# Patient Record
Sex: Male | Born: 1970
Health system: Southern US, Community
[De-identification: ages and names within clinical notes are randomized; demographics above are authoritative.]

## PROBLEM LIST (undated history)

## (undated) DIAGNOSIS — F329 Major depressive disorder, single episode, unspecified: Secondary | ICD-10-CM

## (undated) DIAGNOSIS — K5010 Crohn's disease of large intestine without complications: Secondary | ICD-10-CM

## (undated) DIAGNOSIS — I639 Cerebral infarction, unspecified: Secondary | ICD-10-CM

## (undated) DIAGNOSIS — I2699 Other pulmonary embolism without acute cor pulmonale: Secondary | ICD-10-CM

## (undated) DIAGNOSIS — T7840XA Allergy, unspecified, initial encounter: Secondary | ICD-10-CM

## (undated) DIAGNOSIS — E559 Vitamin D deficiency, unspecified: Secondary | ICD-10-CM

## (undated) DIAGNOSIS — F32A Depression, unspecified: Secondary | ICD-10-CM

## (undated) HISTORY — PX: HIP SURGERY: SHX245

## (undated) HISTORY — DX: Cerebral infarction, unspecified: I63.9

## (undated) HISTORY — DX: Allergy, unspecified, initial encounter: T78.40XA

## (undated) HISTORY — DX: Vitamin D deficiency, unspecified: E55.9

---

## 1998-07-19 ENCOUNTER — Emergency Department (HOSPITAL_COMMUNITY): Admission: EM | Admit: 1998-07-19 | Discharge: 1998-07-19 | Payer: Self-pay | Admitting: Emergency Medicine

## 2006-07-24 ENCOUNTER — Inpatient Hospital Stay (HOSPITAL_COMMUNITY): Admission: EM | Admit: 2006-07-24 | Discharge: 2006-07-27 | Payer: Self-pay | Admitting: Emergency Medicine
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
## 2009-04-22 HISTORY — PX: COLOSTOMY: SHX63
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
## 2009-05-08 ENCOUNTER — Encounter (INDEPENDENT_AMBULATORY_CARE_PROVIDER_SITE_OTHER): Payer: Self-pay
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
## 2009-05-09 IMAGING — CR DG CHEST 1V PORT
1 series · 1 of 1 positions shown · non-contrast
Comparison: [DATE]

CLINICAL DATA: PICC line placement.  Right hip dislocation.

PORTABLE CHEST - 1 VIEW

[view not recorded]
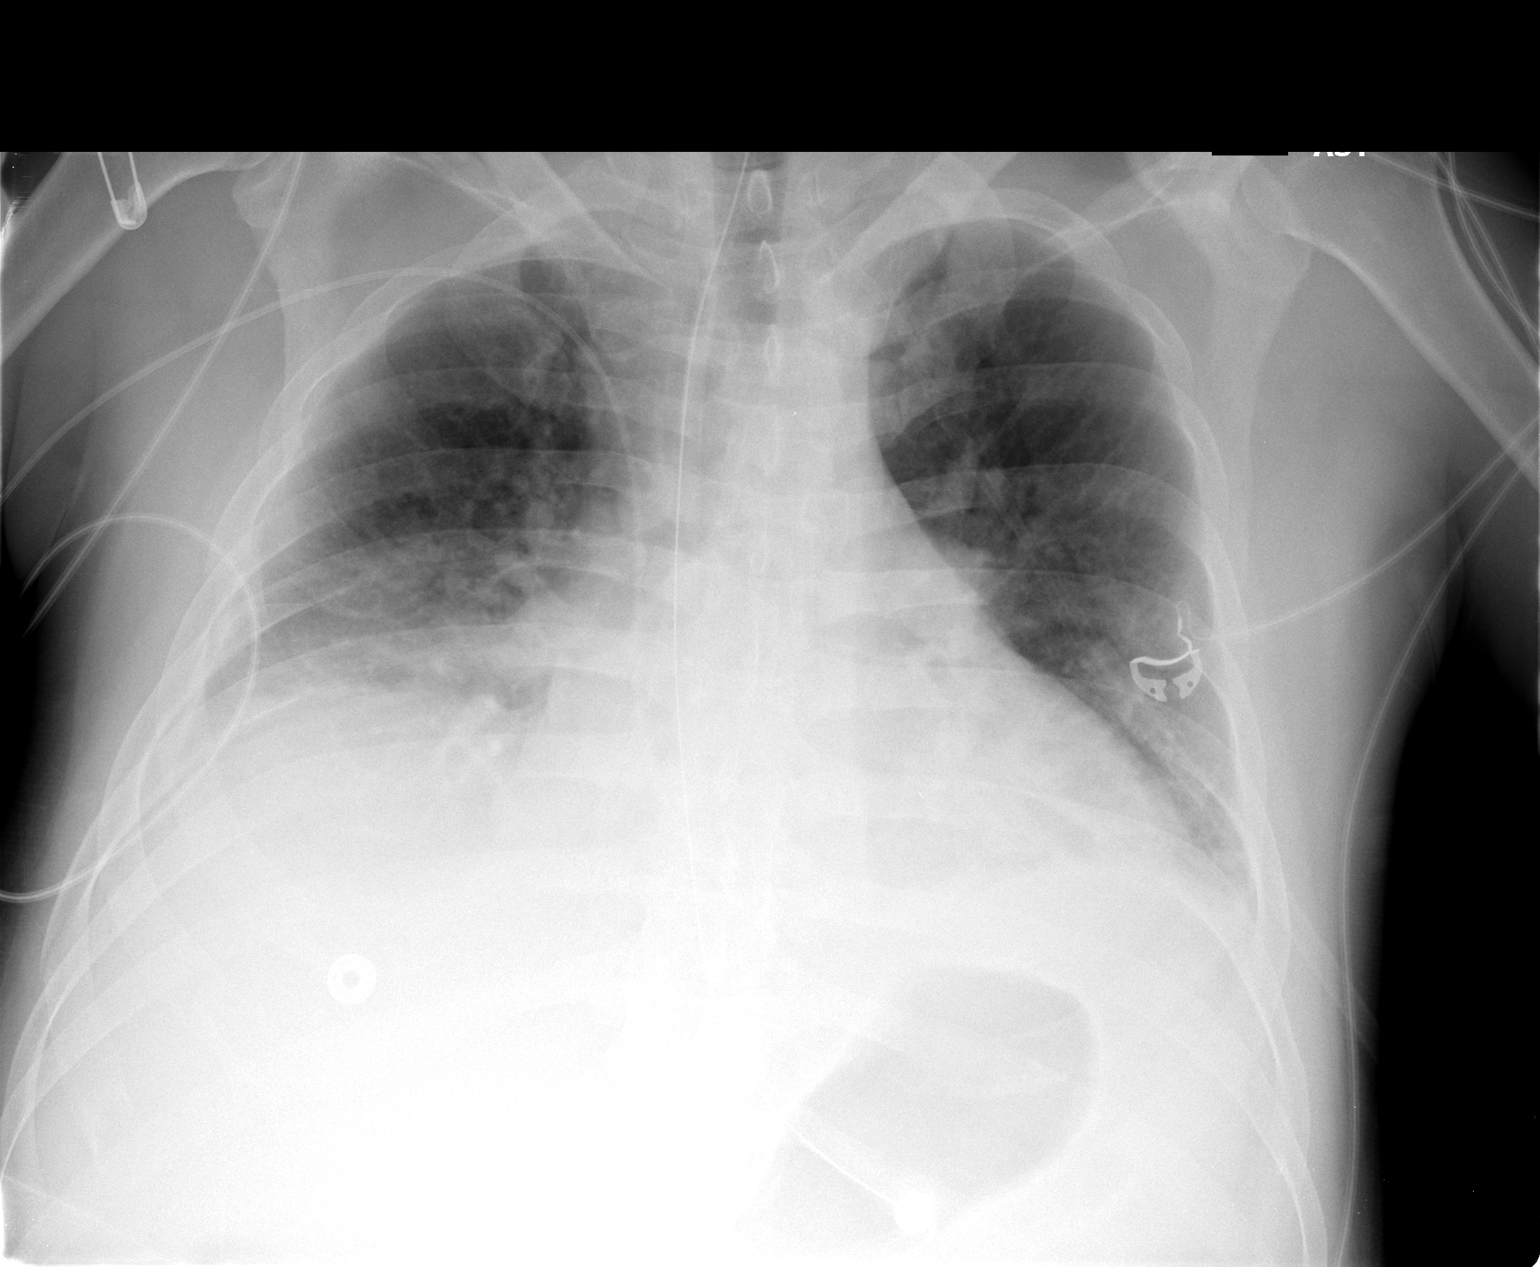

[1 of 1 positions shown; findings below may reference images not displayed]

FINDINGS: Right-sided PICC line has been placed with tip overlying
the lower right atrium.  Recommend 6-7 cm retraction.
An NG tube is again identified entering the stomach with tip off
the field of view.
This is a low-volume film with bibasilar atelectasis/airspace
disease.
Cardiomegaly and pulmonary vascular congestion again noted.
There is no evidence of pneumothorax.
IMPRESSION: Right-sided PICC line with tip overlying the lower right atrium -
recommend 6-7 cm retraction.

Cardiomegaly, pulmonary vascular congestion and bibasilar
atelectasis/airspace disease again noted.

These results were called to EATHALLY on [DATE] at [DATE]
p.m.
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
## 2009-05-13 ENCOUNTER — Ambulatory Visit: Payer: Self-pay | Admitting: Vascular Surgery

## 2009-05-13 ENCOUNTER — Encounter (INDEPENDENT_AMBULATORY_CARE_PROVIDER_SITE_OTHER): Payer: Self-pay | Admitting: General Surgery
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
## 2009-05-19 IMAGING — CT CT ABCESS DRAINAGE
1 series · 14 of 32 positions shown, 18 images · non-contrast
Comparison: none

CLINICAL DATA: Abdominal surgery, fluid collections concerning for
abscesses

[Series 2: abd pelvis · axial · 0.92mm/px · z∈[-292,-37]mm · 14 of 146 slices shown, 18 images]
[im 10/146  soft-tissue]
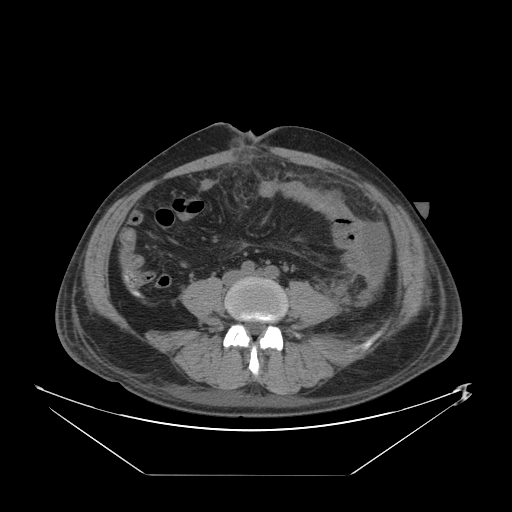
[im 10/146  bone]
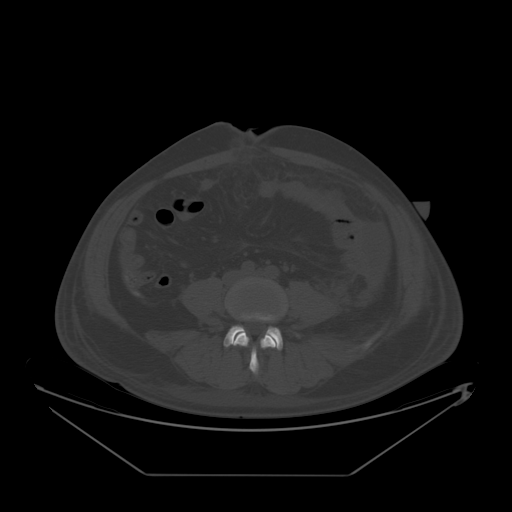
[im 19/146  soft-tissue]
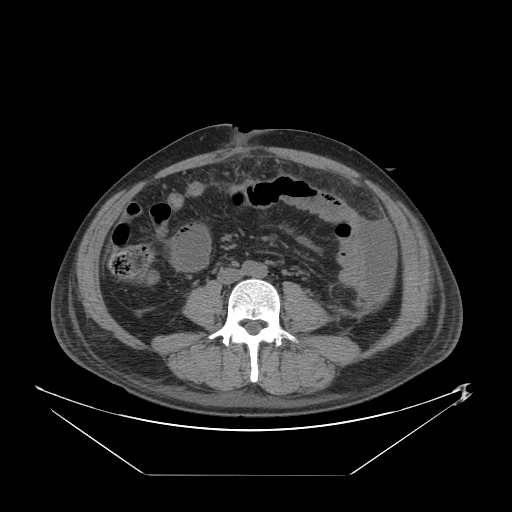
[im 33/146  soft-tissue]
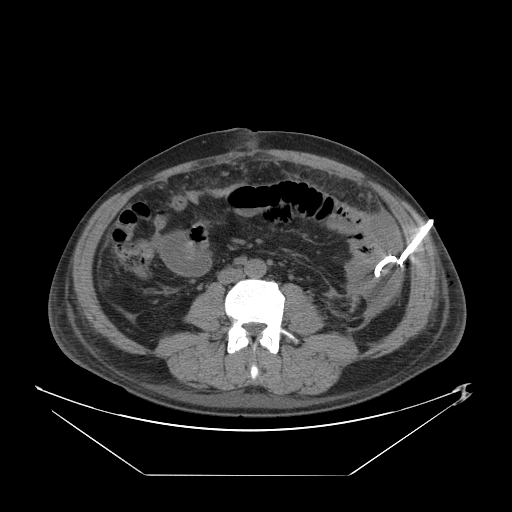
[im 43/146  soft-tissue]
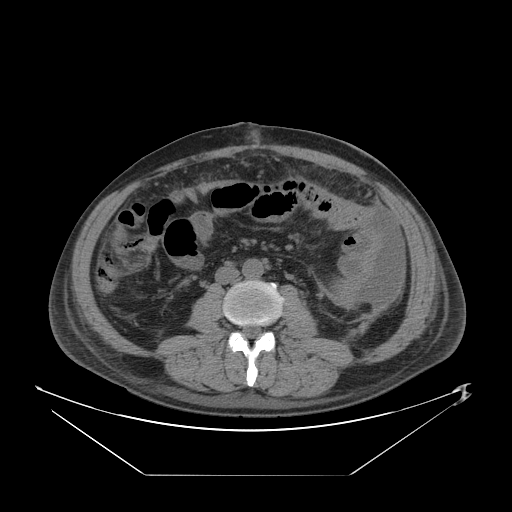
[im 57/146  soft-tissue]
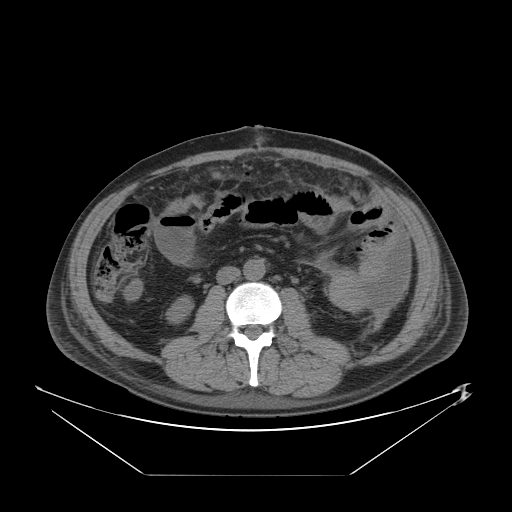
[im 66/146  soft-tissue]
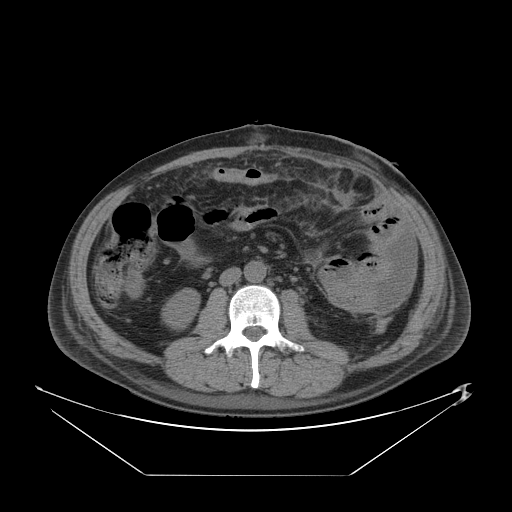
[im 80/146  soft-tissue]
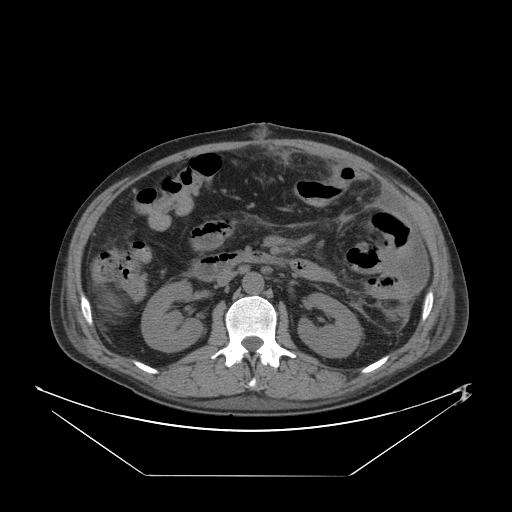
[im 89/146  soft-tissue]
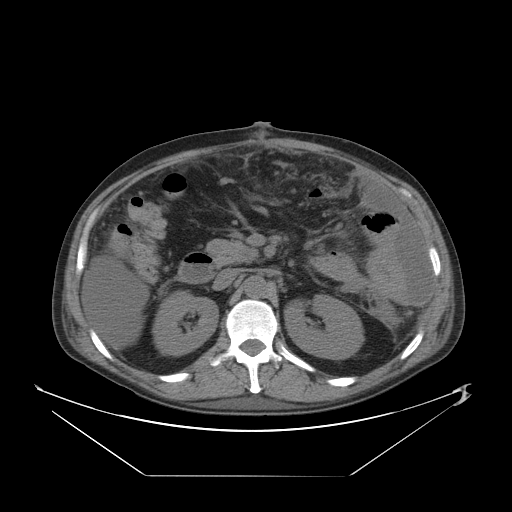
[im 103/146  soft-tissue]
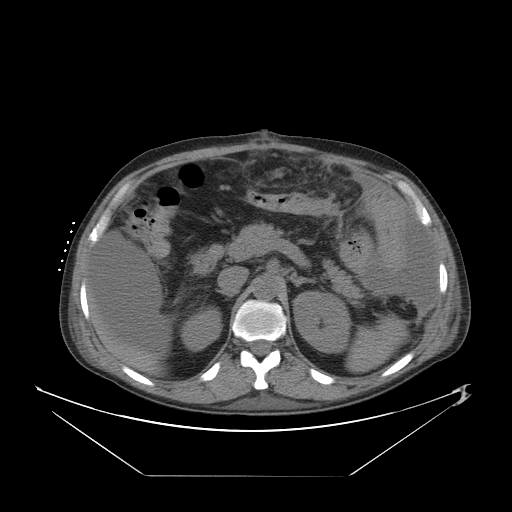
[im 103/146  bone]
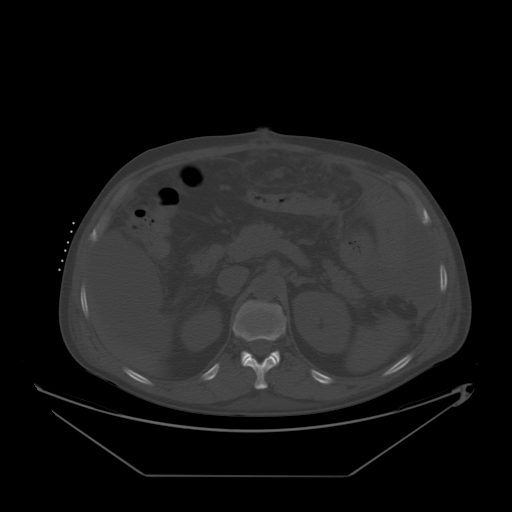
[im 113/146  soft-tissue]
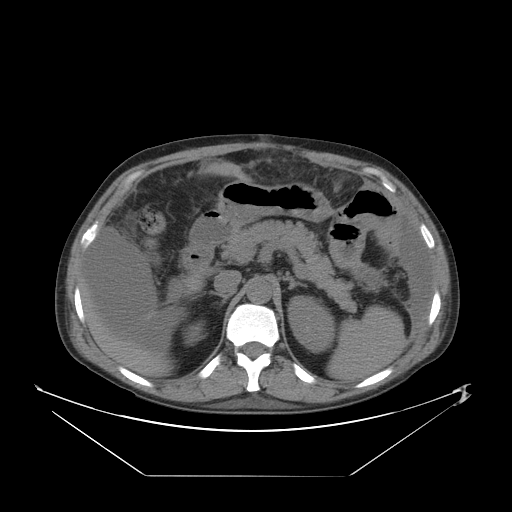
[im 127/146  soft-tissue]
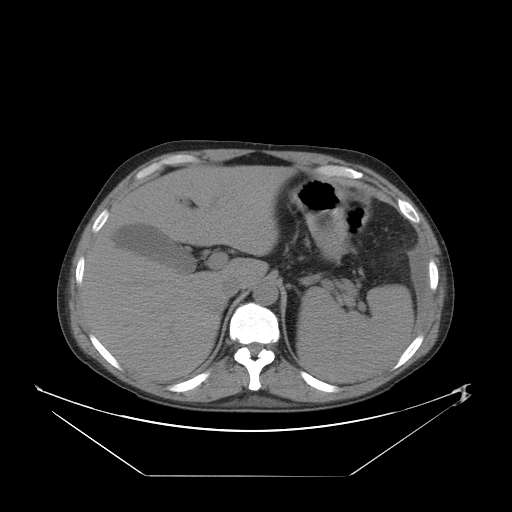
[im 127/146  lung]
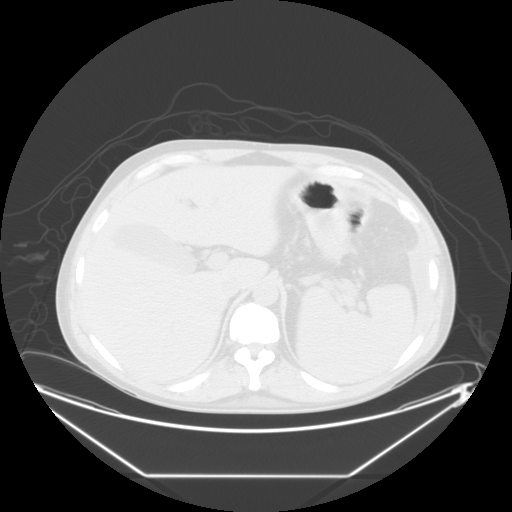
[im 131/146  lung]
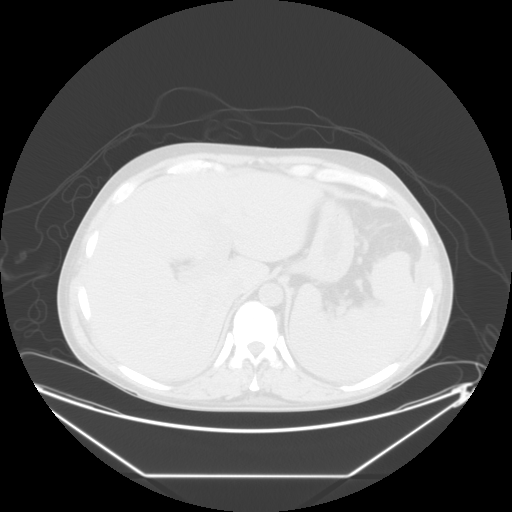
[im 136/146  soft-tissue]
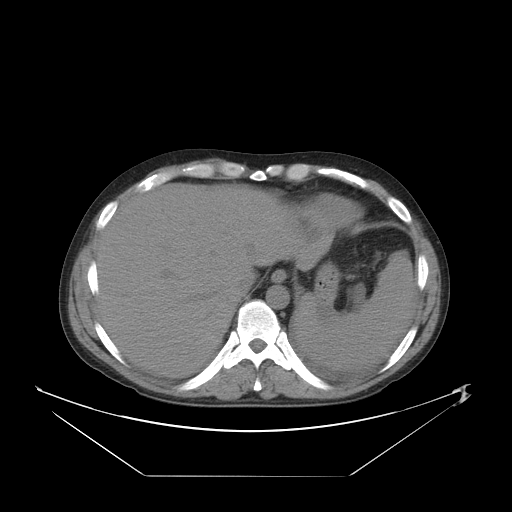
[im 136/146  lung]
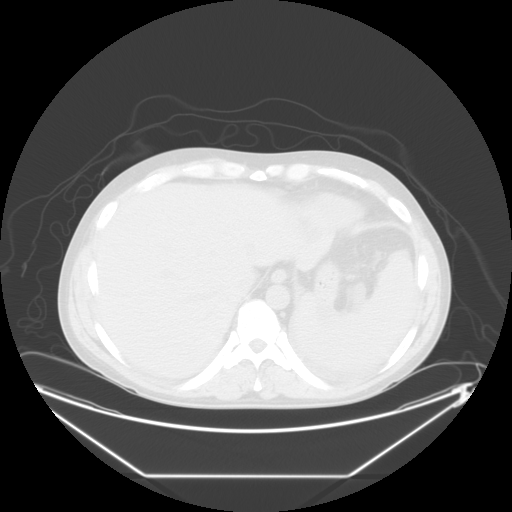
[im 141/146  lung]
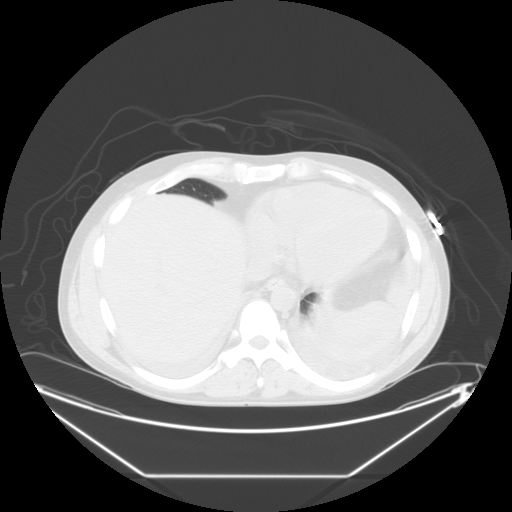

[14 of 32 positions shown; findings below may reference images not displayed]

BILATERAL 12-FRENCH ABDOMINAL ABSCESS DRAINS

Date:  [DATE] [DATE]

Radiologist:  CEEJAY, M.D.

Medications:  6 mg Versed, 300 mcg Fentanyl

Guidance:  CT

Fluoroscopy time:  None.

Sedation time:  35 minutes

Contrast volume:  None.

Complications:  No immediate

PROCEDURE/FINDINGS:

Informed consent was obtained from the patient following
explanation of the procedure, risks, benefits and alternatives.
The patient understands, agrees and consents for the procedure.
All questions were addressed.  A time out was performed.

Maximal barrier sterile technique utilized including caps, mask,
sterile gowns, sterile gloves, large sterile drape, hand hygiene,
and betadine

Previous CT scan was reviewed demonstrating bilateral flank fluid
collections.  These are concerning for postoperative abscesses.

Left abdominal abscess drain: The patient was positioned supine.
Noncontrast localization CT was performed through the abdomen.
Initially the left abdominal abscess was localized.  Under sterile
conditions and local anesthesia, an 18 gauge 10 cm access needle
was advanced from the left flank approach into the lower aspect of
the fluid collection.  Guide wire was advanced followed by tract
dilatation to insert a 12-French drain.  Drainage position
confirmed in the fluid collection inferiorly.  Syringe aspiration
yielded serosanguinous fluid.  Sample sent for Gram stain culture.
Catheter secured with a Prolene suture and connected to external
gravity drainage.  Sterile dressing applied.  No immediate
complication.

Right abdominal abscess drain:  In a similar fashion, the right
sided sub hepatic fluid collection was localized.  Under sterile
conditions and local anesthesia, a 18 gauge 10 cm access was
advanced from a right  mid axillary lower intercostal approach.
Needle position was confirmed with return of serosanguinous fluid.
Amplatz guide wire was inserted followed by tract dilatation to
advance a 12 drain.  Drainage catheter position confirmed with CT.
Syringe aspiration yielded serous fluid.

Sample sent from both sides for Gram stain and culture.  Right
abdominal abscess drain was secured with a Prolene suture and
connected to external gravity drainage.  Sterile dressing applied.

No immediate complication.  The patient tolerated the procedure
well.
IMPRESSION: Bilateral 12-French abdominal abscess drain insertions.
Serosanguinous fluid obtained from both sides.  Gram stain and
cultures sent.

## 2009-06-25 ENCOUNTER — Encounter: Admission: RE | Admit: 2009-06-25 | Discharge: 2009-08-12 | Payer: Self-pay | Admitting: Orthopedic Surgery

## 2009-06-28 IMAGING — CR DG HIP COMPLETE 2+V*R*
3 series · 3 of 3 positions shown · non-contrast
Comparison: CT abdomen and pelvis [DATE].

CLINICAL DATA: Status post surgery of the right hip in [DATE].
Pain and hip popping.

RIGHT HIP - COMPLETE 2+ VIEW

[t hip ap right]
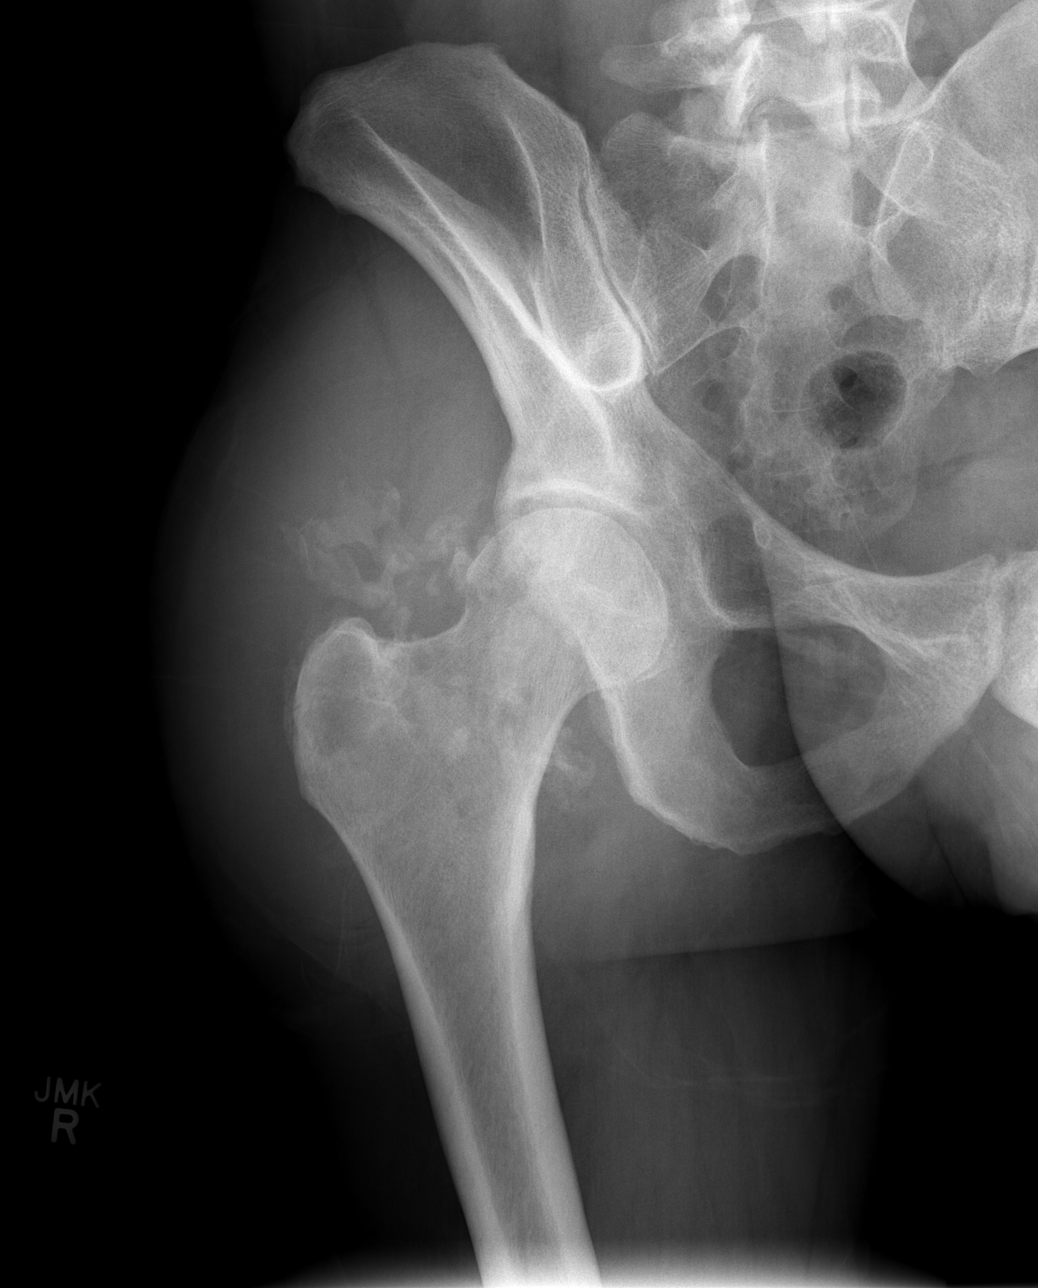

[t pelvis a.p.]
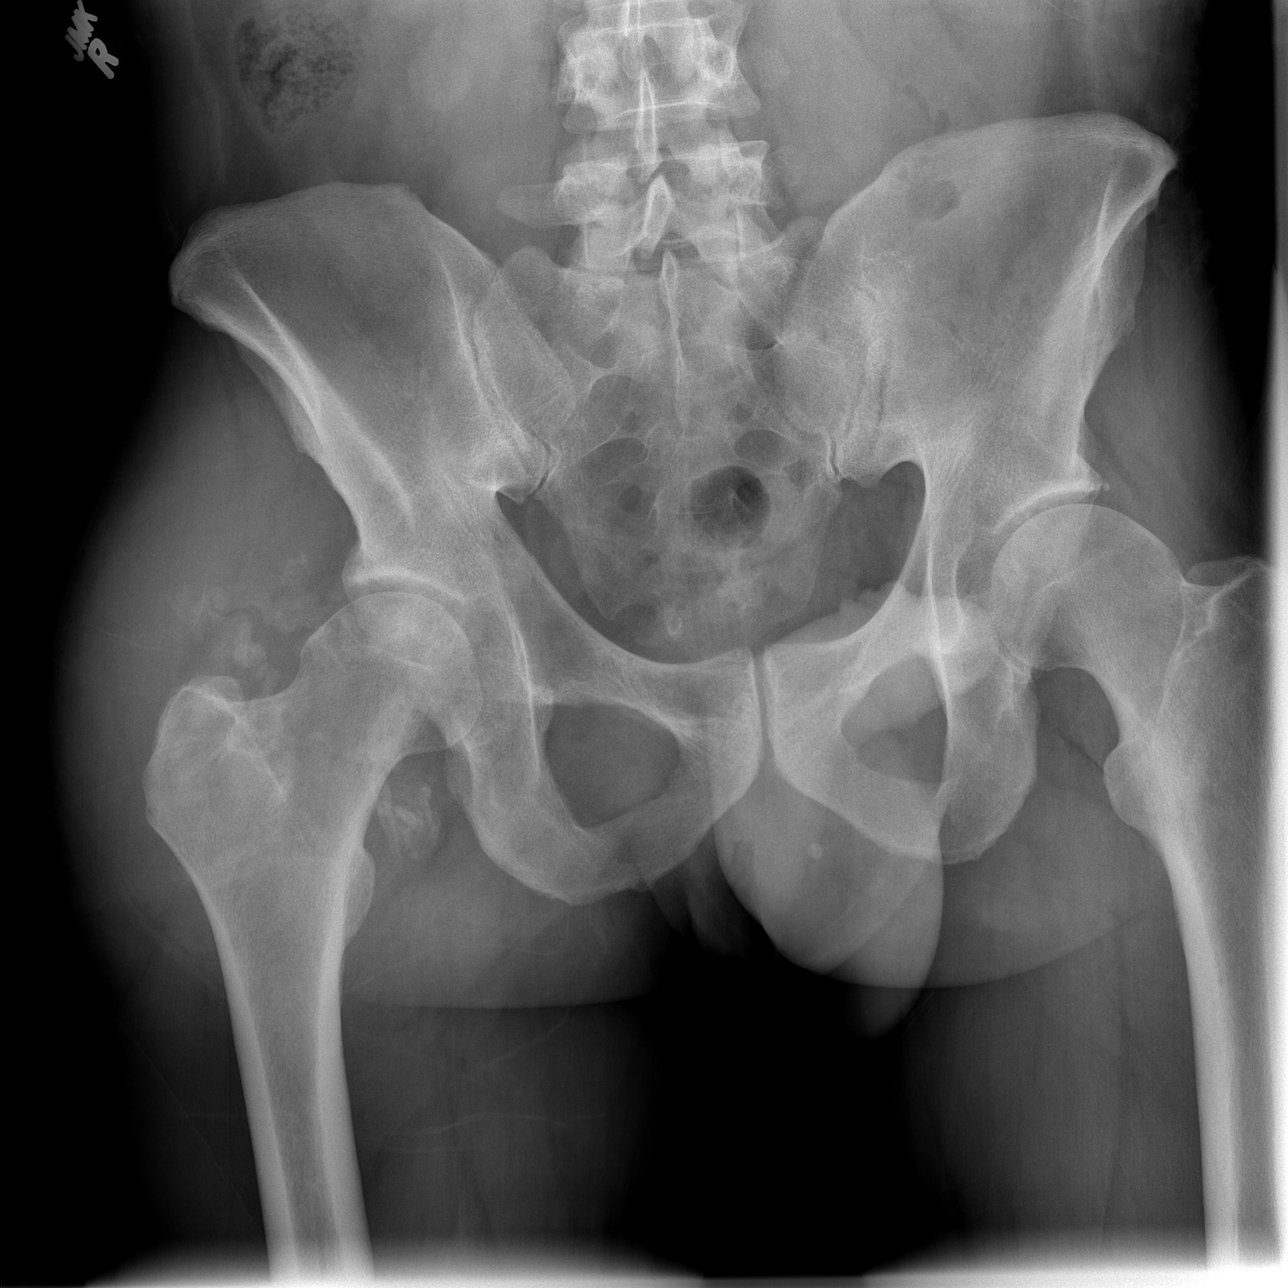

[w cross table hip right *]
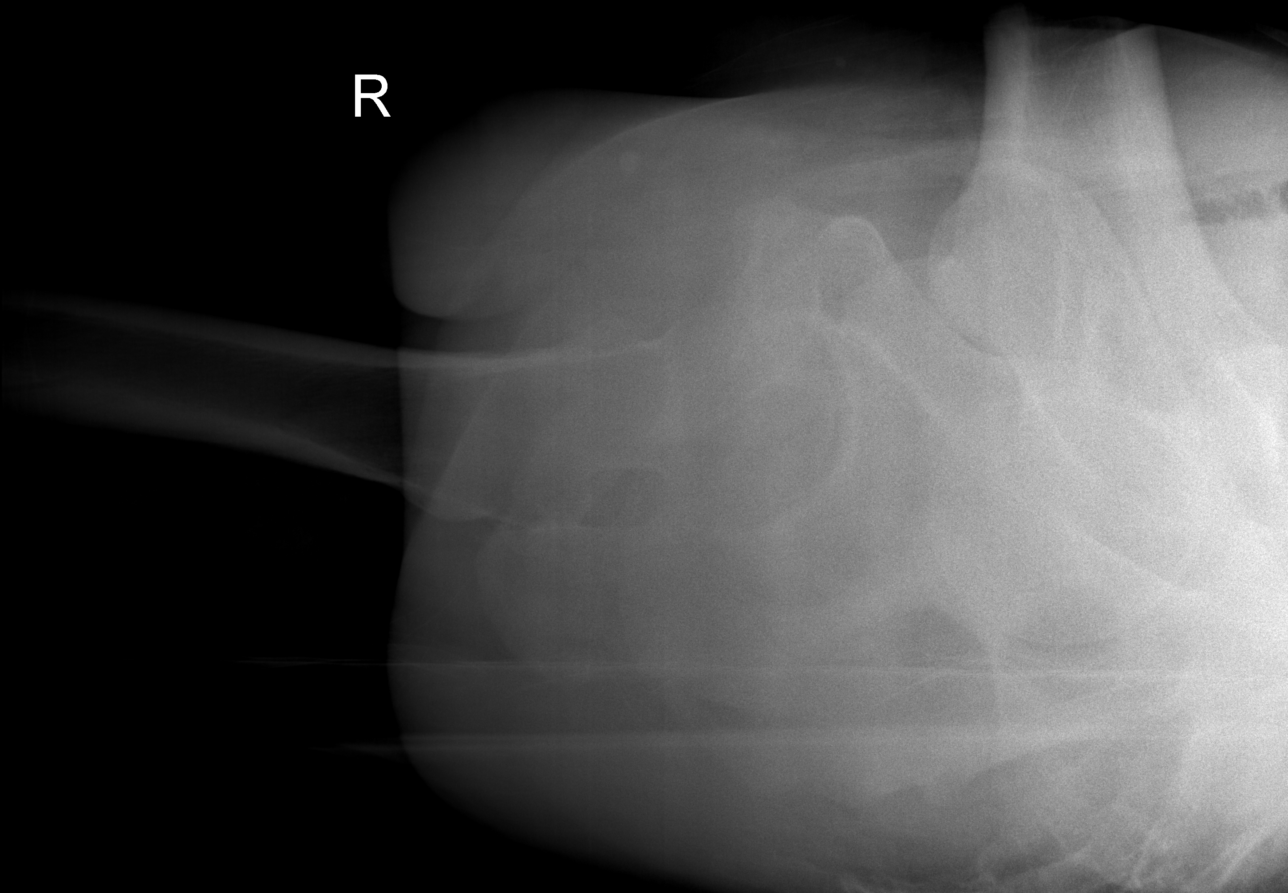

[3 of 3 positions shown; findings below may reference images not displayed]

FINDINGS: Both hips are located and there is no fracture.  There is
heterotopic ossification developing about the right hip likely
related to the fluid collection seen on the prior CT scan.  There
is no fracture.
IMPRESSION: 1.  Negative for fracture dislocation.
2.  Heterotopic ossification developing about the right hip likely
related to the fluid collection about the right hip seen on the
comparison CT.
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
## 2009-09-11 ENCOUNTER — Ambulatory Visit (HOSPITAL_COMMUNITY): Admission: RE | Admit: 2009-09-11 | Discharge: 2009-09-11 | Payer: Self-pay | Admitting: Gastroenterology

## 2009-09-23 ENCOUNTER — Encounter (HOSPITAL_COMMUNITY): Admission: RE | Admit: 2009-09-23 | Discharge: 2009-09-24 | Payer: Self-pay | Admitting: Gastroenterology

## 2009-10-07 ENCOUNTER — Encounter (HOSPITAL_COMMUNITY): Admission: RE | Admit: 2009-10-07 | Discharge: 2009-11-18 | Payer: Self-pay | Admitting: Gastroenterology

## 2009-12-29 ENCOUNTER — Encounter (HOSPITAL_COMMUNITY)
Admission: RE | Admit: 2009-12-29 | Discharge: 2010-02-20 | Payer: Self-pay | Source: Home / Self Care | Attending: Gastroenterology | Admitting: Gastroenterology

## 2010-01-29 ENCOUNTER — Inpatient Hospital Stay (HOSPITAL_COMMUNITY): Admission: EM | Admit: 2010-01-29 | Discharge: 2009-05-20 | Payer: Self-pay | Admitting: Emergency Medicine

## 2010-01-29 ENCOUNTER — Emergency Department (HOSPITAL_COMMUNITY): Admission: EM | Admit: 2010-01-29 | Discharge: 2009-06-28 | Payer: Self-pay | Admitting: Emergency Medicine

## 2010-02-22 HISTORY — PX: OTHER SURGICAL HISTORY: SHX169

## 2010-02-24 ENCOUNTER — Ambulatory Visit (HOSPITAL_COMMUNITY)
Admission: RE | Admit: 2010-02-24 | Discharge: 2010-02-24 | Payer: Self-pay | Source: Home / Self Care | Attending: General Surgery | Admitting: General Surgery

## 2010-02-24 ENCOUNTER — Encounter (HOSPITAL_COMMUNITY)
Admission: RE | Admit: 2010-02-24 | Discharge: 2010-03-24 | Payer: Self-pay | Source: Home / Self Care | Attending: Gastroenterology | Admitting: Gastroenterology

## 2010-02-24 IMAGING — RF DG BE THRU COLOSTOMY
14 of 24 series · 14 of 24 positions shown · IV contrast (agent unspecified)
Comparison: CT of [DATE]

CLINICAL DATA: History of descending colostomy for complicated
diverticulitis.  Evaluate prior  to reconnection.

SINGLE CONTRAST BARIUM ENEMA
Fluoroscopy Time: 2.6 minutes
Contrast: A standard barium

[Series 1: run · 1 of 1 slices shown (1 of 14)]
[im 1/1]
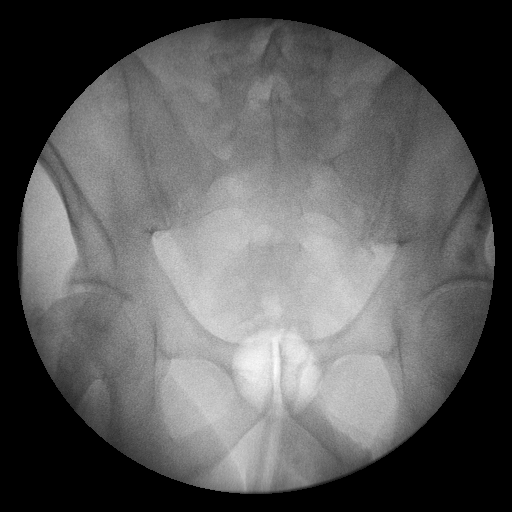

[Series 3: run · 1 of 1 slices shown (2 of 14)]
[im 1/1]
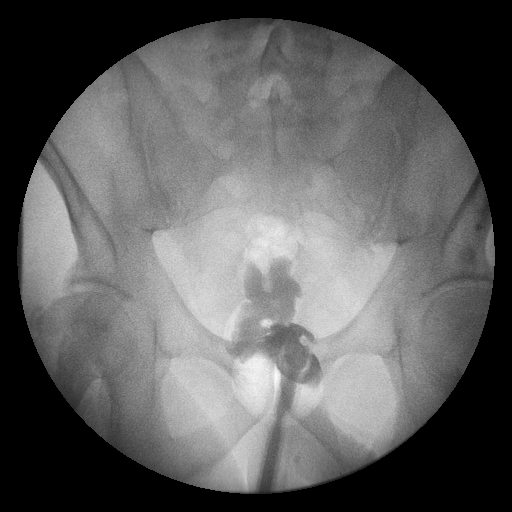

[Series 5: run · 1 of 1 slices shown (3 of 14)]
[im 1/1]
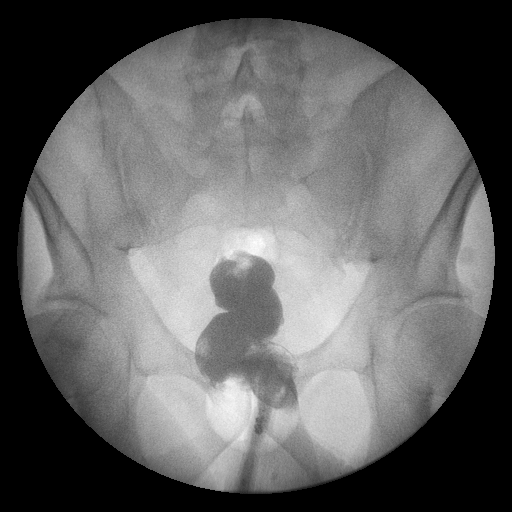

[Series 7: run · 1 of 1 slices shown (4 of 14)]
[im 1/1]
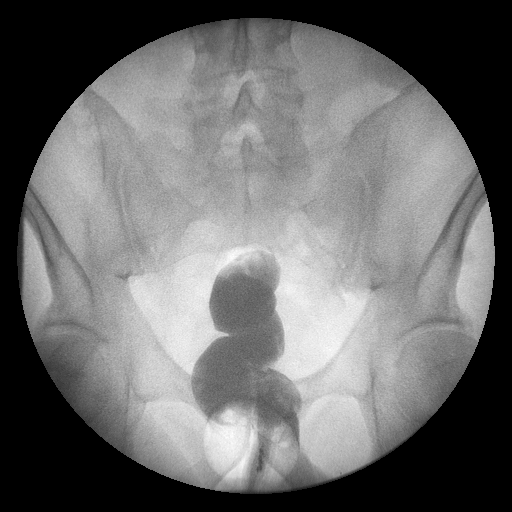

[Series 8: run · 1 of 1 slices shown (5 of 14)]
[im 1/1]
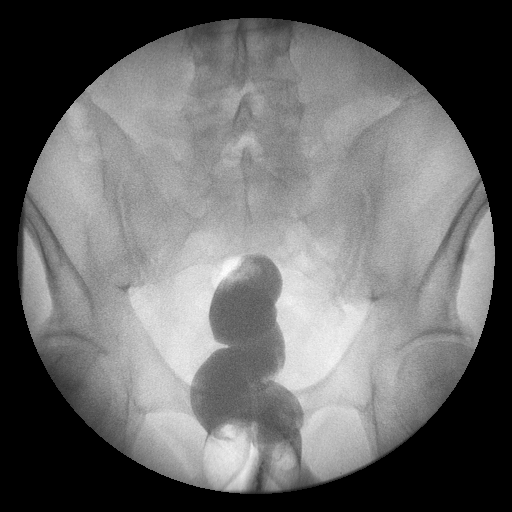

[Series 10: run · 1 of 1 slices shown (6 of 14)]
[im 1/1]
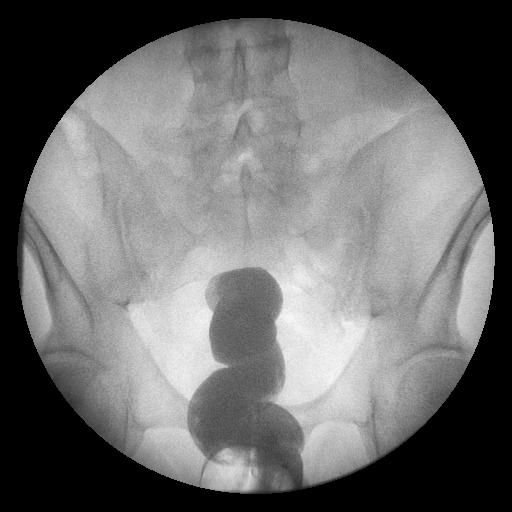

[Series 12: run · 1 of 1 slices shown (7 of 14)]
[im 1/1]
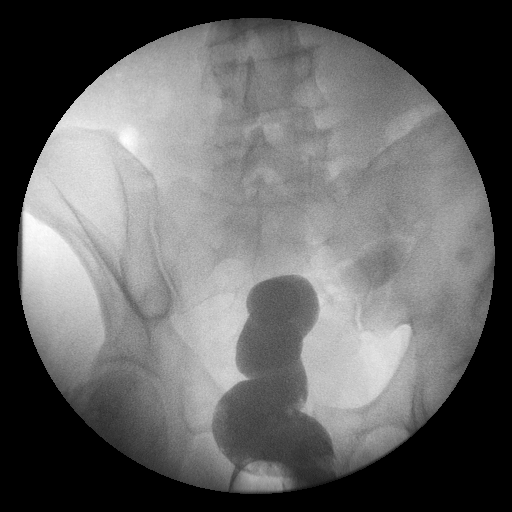

[Series 13: run · 1 of 1 slices shown (8 of 14)]
[im 1/1]
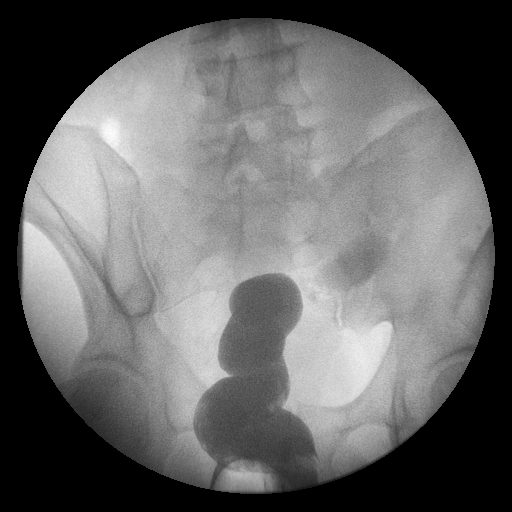

[Series 15: run · 1 of 1 slices shown (9 of 14)]
[im 1/1]
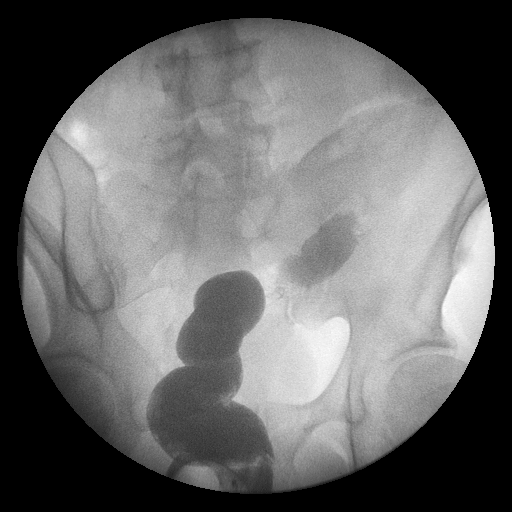

[Series 17: run · 1 of 1 slices shown (10 of 14)]
[im 1/1]
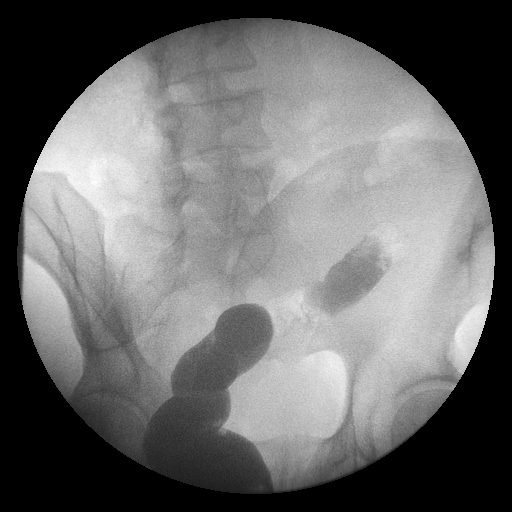

[Series 19: run · 1 of 1 slices shown (11 of 14)]
[im 1/1]
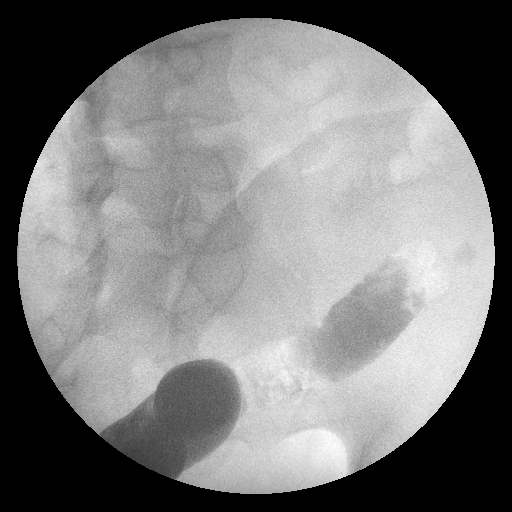

[Series 20: run · 1 of 1 slices shown (12 of 14)]
[im 1/1]
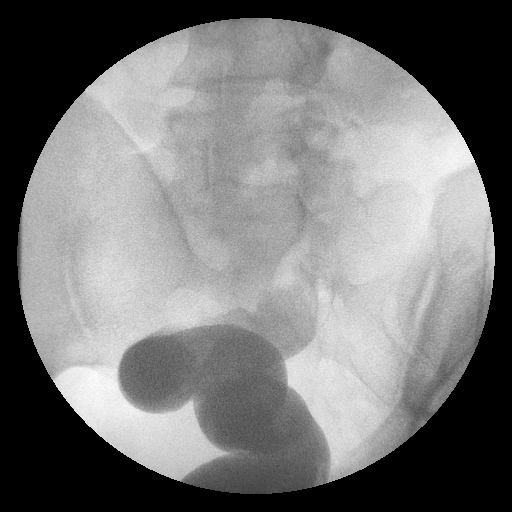

[Series 22: run · 1 of 1 slices shown (13 of 14)]
[im 1/1]
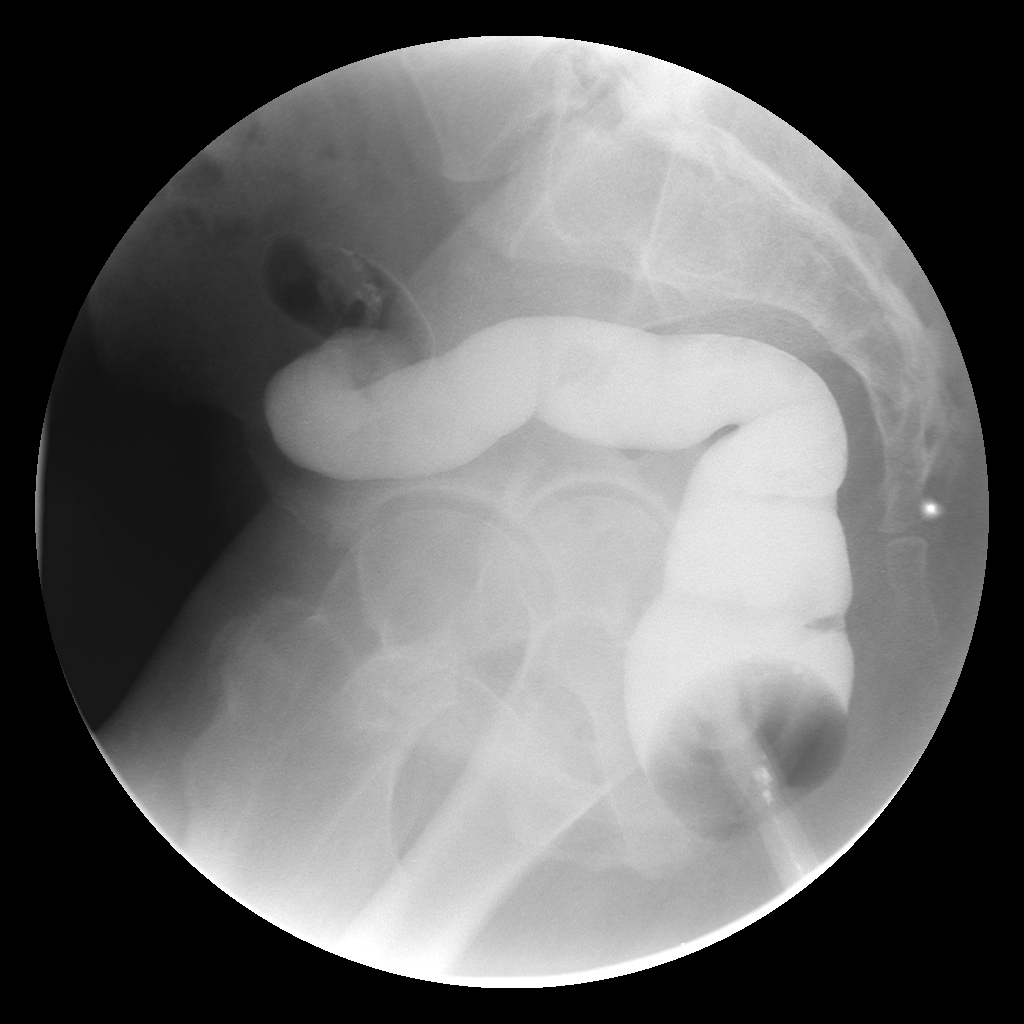

[Series 24: run · 1 of 1 slices shown (14 of 14)]
[im 1/1]
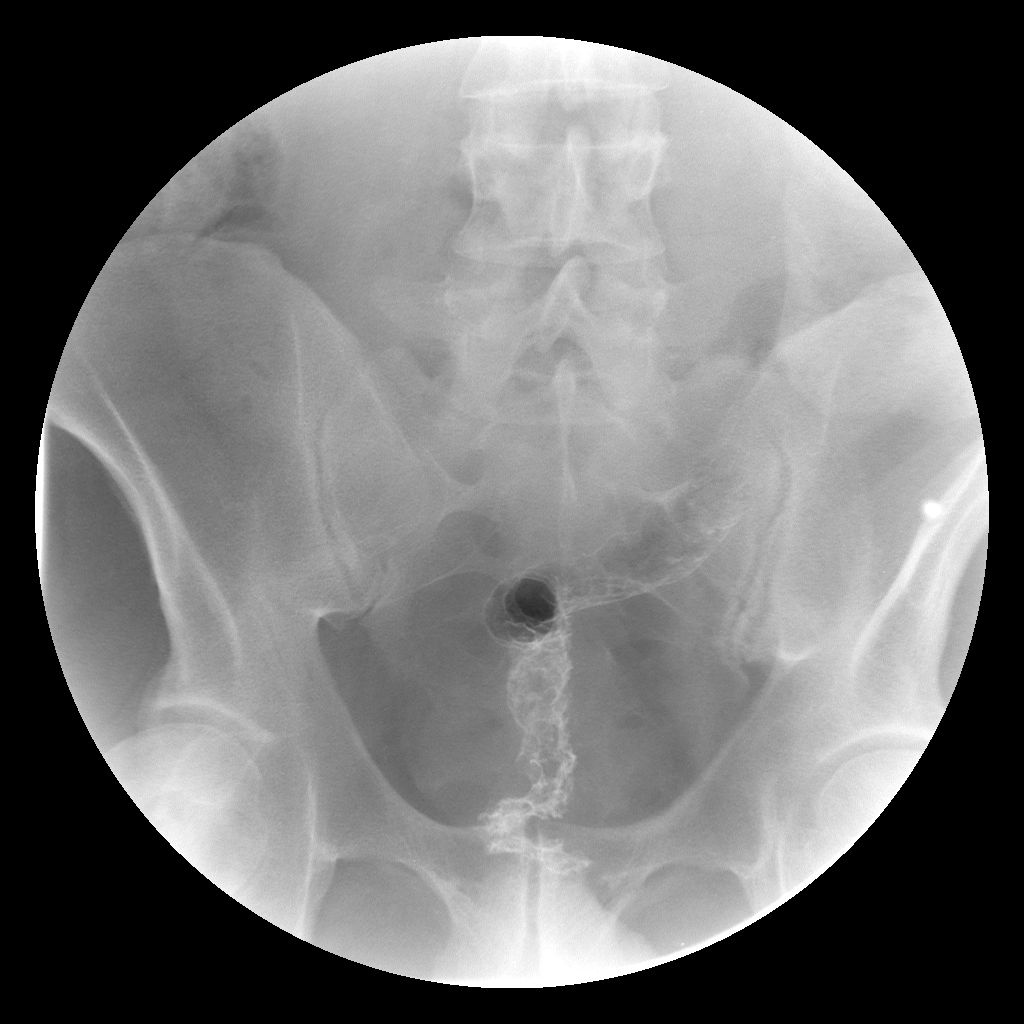

[14 of 24 positions shown; findings below may reference images not displayed]

FINDINGS: Pre procedure scout film demonstrates a nonobstructive
bowel gas.  The descending colostomy is identified within the left
upper quadrant.

Contrast administration demonstrates normal caliber of the rectum
and sigmoid colon.  Contrast fills to the level of the mid to
proximal sigmoid.  No contrast extravasation.

Post evacuation film demonstrates no residual retained contrast.
IMPRESSION: Normal appearance of the Hartmann's pouch.

## 2010-03-06 IMAGING — CR DG CHEST 2V
2 series · 2 of 2 positions shown · non-contrast
Comparison: [DATE]

CLINICAL DATA: Preoperative evaluation for colostomy surgery.
Occasional smoker

CHEST - 2 VIEW

[w chest pa]
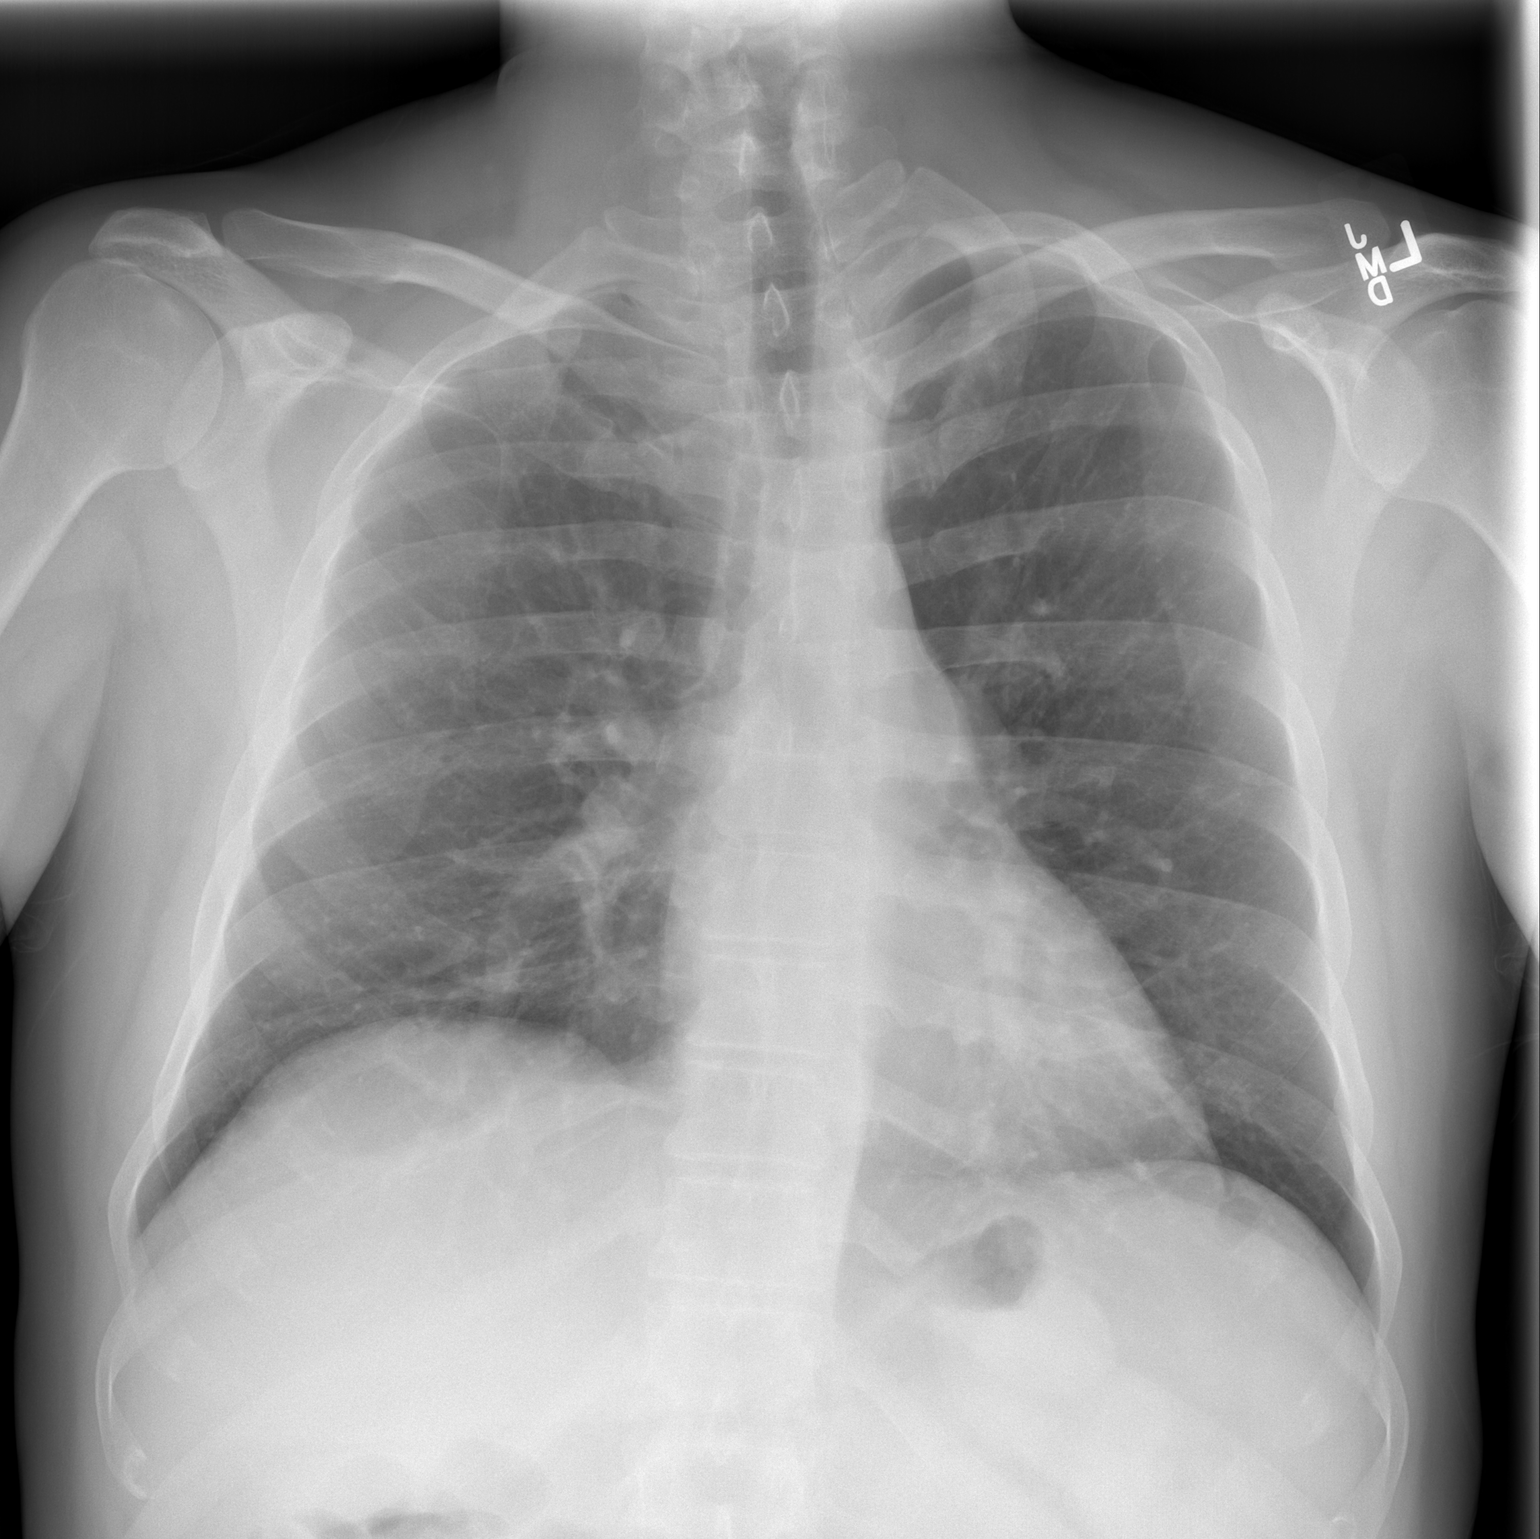

[w chest lat]
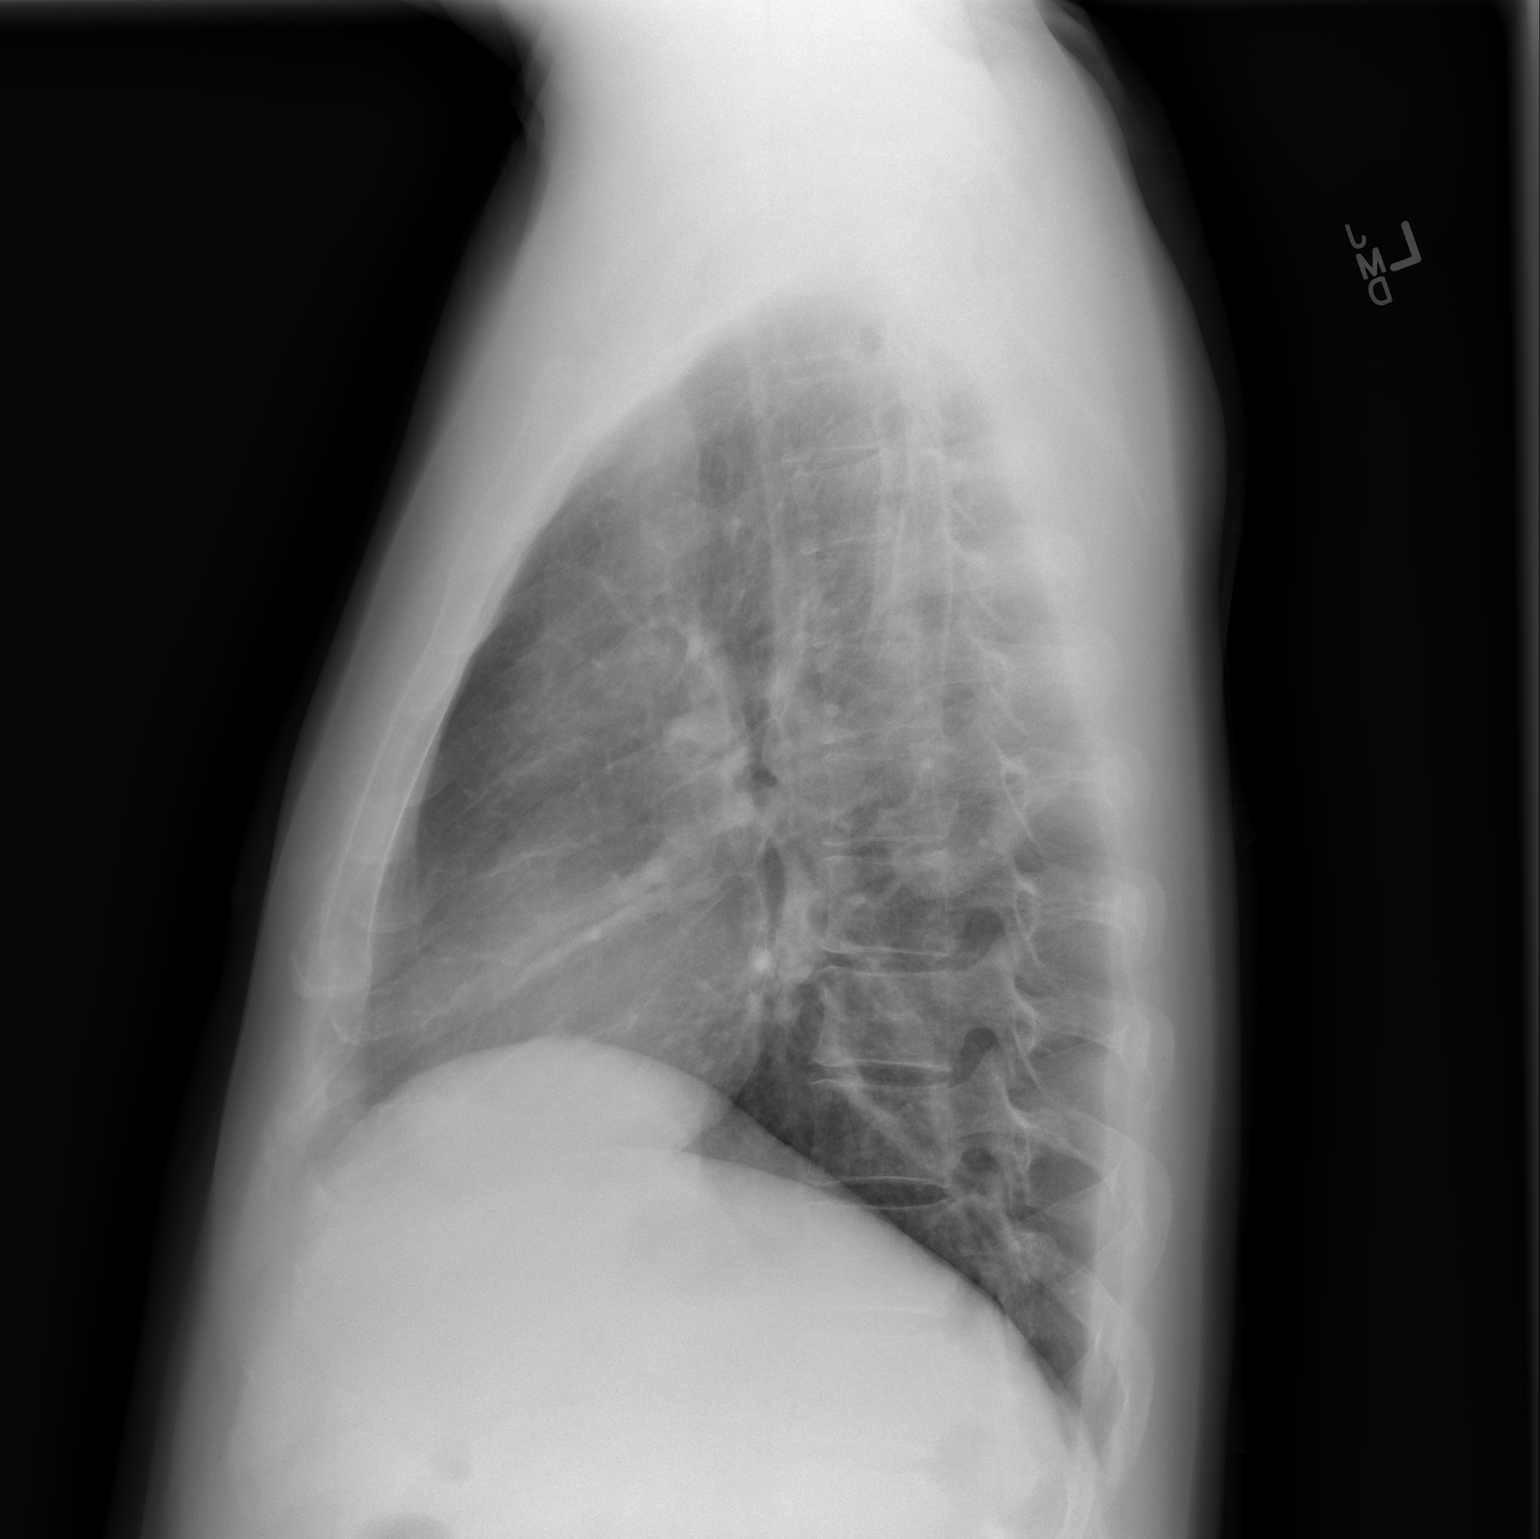

[2 of 2 positions shown; findings below may reference images not displayed]

FINDINGS: Heart and mediastinal contours are within normal limits.
The lung fields appear clear with no signs of focal infiltrate or
congestive failure.  No pleural fluid or peribronchial cuffing is
seen.

Bony structures appear intact.
IMPRESSION: No acute or worrisome focal cardiopulmonary abnormality noted.

## 2010-03-09 LAB — COMPREHENSIVE METABOLIC PANEL
ALT: 39 U/L (ref 0–53)
AST: 24 U/L (ref 0–37)
Albumin: 4.3 g/dL (ref 3.5–5.2)
Alkaline Phosphatase: 82 U/L (ref 39–117)
BUN: 10 mg/dL (ref 6–23)
CO2: 25 mEq/L (ref 19–32)
Calcium: 9.2 mg/dL (ref 8.4–10.5)
Chloride: 107 mEq/L (ref 96–112)
Creatinine, Ser: 0.79 mg/dL (ref 0.4–1.5)
GFR calc Af Amer: >60 mL/min (ref >60)
GFR calc non Af Amer: >60 mL/min (ref >60)
Glucose, Bld: 96 mg/dL (ref 70–99)
Potassium: 4.3 mEq/L (ref 3.5–5.1)
Sodium: 141 mEq/L (ref 135–145)
Total Bilirubin: 0.8 mg/dL (ref 0.3–1.2)
Total Protein: 7.8 g/dL (ref 6.0–8.3)

## 2010-03-09 LAB — DIFFERENTIAL
Basophils Absolute: 0 10*3/uL (ref 0.0–0.1)
Basophils Relative: 1 % (ref 0–1)
Eosinophils Absolute: 0.1 10*3/uL (ref 0.0–0.7)
Eosinophils Relative: 1 % (ref 0–5)
Lymphocytes Relative: 37 % (ref 12–46)
Lymphs Abs: 2.3 10*3/uL (ref 0.7–4.0)
Monocytes Absolute: 0.6 10*3/uL (ref 0.1–1.0)
Monocytes Relative: 9 % (ref 3–12)
Neutro Abs: 3.2 10*3/uL (ref 1.7–7.7)
Neutrophils Relative %: 52 % (ref 43–77)

## 2010-03-09 LAB — CBC
HCT: 44.9 % (ref 39.0–52.0)
Hemoglobin: 15.9 g/dL (ref 13.0–17.0)
MCH: 30.8 pg (ref 26.0–34.0)
MCHC: 35.4 g/dL (ref 30.0–36.0)
MCV: 87 fL (ref 78.0–100.0)
Platelets: 214 10*3/uL (ref 150–400)
RBC: 5.16 MIL/uL (ref 4.22–5.81)
RDW: 12.6 % (ref 11.5–15.5)
WBC: 6.2 10*3/uL (ref 4.0–10.5)

## 2010-03-09 LAB — PROTIME-INR
INR: 1 (ref 0.00–1.49)
Prothrombin Time: 13.4 seconds (ref 11.6–15.2)

## 2010-03-09 LAB — SURGICAL PCR SCREEN
MRSA, PCR: NEGATIVE
Staphylococcus aureus: NEGATIVE

## 2010-03-13 ENCOUNTER — Encounter (INDEPENDENT_AMBULATORY_CARE_PROVIDER_SITE_OTHER): Payer: Self-pay | Admitting: General Surgery

## 2010-03-13 ENCOUNTER — Inpatient Hospital Stay (HOSPITAL_COMMUNITY)
Admission: RE | Admit: 2010-03-13 | Discharge: 2010-03-24 | Disposition: A | Discharge status: Home / Self Care | Payer: Self-pay | Source: Home / Self Care | Attending: General Surgery | Admitting: General Surgery
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
## 2010-03-16 LAB — TYPE AND SCREEN: ABO/RH(D): O POS

## 2010-03-16 LAB — ABO/RH: ABO/RH(D): O POS

## 2010-03-17 LAB — BASIC METABOLIC PANEL
CO2: 29 mEq/L (ref 19–32)
CO2: 28 mEq/L (ref 19–32)
Calcium: 8.4 mg/dL (ref 8.4–10.5)
Chloride: 102 mEq/L (ref 96–112)
GFR calc Af Amer: >60 mL/min (ref >60)
GFR calc Af Amer: >60 mL/min (ref >60)
GFR calc non Af Amer: >60 mL/min (ref >60)
GFR calc non Af Amer: >60 mL/min (ref >60)
Glucose, Bld: 110 mg/dL — ABNORMAL HIGH (ref 70–99)
Sodium: 138 mEq/L (ref 135–145)

## 2010-03-17 LAB — CBC
HCT: 33.4 % — ABNORMAL LOW (ref 39.0–52.0)
Hemoglobin: 11.5 g/dL — ABNORMAL LOW (ref 13.0–17.0)
Hemoglobin: 10.2 g/dL — ABNORMAL LOW (ref 13.0–17.0)
MCH: 29.9 pg (ref 26.0–34.0)
MCH: 30.4 pg (ref 26.0–34.0)
MCHC: 34.3 g/dL (ref 30.0–36.0)
MCV: 86.8 fL (ref 78.0–100.0)
MCV: 88.7 fL (ref 78.0–100.0)
Platelets: 174 10*3/uL (ref 150–400)
RBC: 3.85 MIL/uL — ABNORMAL LOW (ref 4.22–5.81)
RBC: 3.35 MIL/uL — ABNORMAL LOW (ref 4.22–5.81)

## 2010-03-18 LAB — CBC
MCH: 30.4 pg (ref 26.0–34.0)
MCHC: 34.3 g/dL (ref 30.0–36.0)
Platelets: 198 10*3/uL (ref 150–400)
RDW: 12.5 % (ref 11.5–15.5)

## 2010-03-18 LAB — BASIC METABOLIC PANEL
BUN: 6 mg/dL (ref 6–23)
Chloride: 104 mEq/L (ref 96–112)
GFR calc Af Amer: >60 mL/min (ref >60)
Sodium: 140 mEq/L (ref 135–145)

## 2010-03-21 LAB — BASIC METABOLIC PANEL
CO2: 28 mEq/L (ref 19–32)
Creatinine, Ser: 0.78 mg/dL (ref 0.4–1.5)
GFR calc Af Amer: >60 mL/min (ref >60)
GFR calc non Af Amer: >60 mL/min (ref >60)
Potassium: 3.9 mEq/L (ref 3.5–5.1)

## 2010-03-21 LAB — CBC
Hemoglobin: 9 g/dL — ABNORMAL LOW (ref 13.0–17.0)
MCH: 29.1 pg (ref 26.0–34.0)
MCHC: 32.8 g/dL (ref 30.0–36.0)
Platelets: 260 10*3/uL (ref 150–400)
RBC: 3.09 MIL/uL — ABNORMAL LOW (ref 4.22–5.81)
RDW: 12.4 % (ref 11.5–15.5)
WBC: 7.1 10*3/uL (ref 4.0–10.5)

## 2010-03-23 LAB — URINALYSIS, ROUTINE W REFLEX MICROSCOPIC
Bilirubin Urine: NEGATIVE
Hgb urine dipstick: NEGATIVE
Protein, ur: NEGATIVE mg/dL
Specific Gravity, Urine: 1.024 (ref 1.005–1.030)
Urine Glucose, Fasting: NEGATIVE mg/dL
Urobilinogen, UA: 0.2 mg/dL (ref 0.0–1.0)

## 2010-03-23 LAB — CBC
HCT: 28.6 % — ABNORMAL LOW (ref 39.0–52.0)
MCHC: 33.2 g/dL (ref 30.0–36.0)
Platelets: 282 10*3/uL (ref 150–400)
RBC: 3.26 MIL/uL — ABNORMAL LOW (ref 4.22–5.81)
RDW: 12.2 % (ref 11.5–15.5)
WBC: 11.1 10*3/uL — ABNORMAL HIGH (ref 4.0–10.5)

## 2010-03-23 LAB — BASIC METABOLIC PANEL
CO2: 30 mEq/L (ref 19–32)
Chloride: 103 mEq/L (ref 96–112)
Creatinine, Ser: 0.97 mg/dL (ref 0.4–1.5)
GFR calc Af Amer: >60 mL/min (ref >60)
GFR calc non Af Amer: >60 mL/min (ref >60)

## 2010-03-24 LAB — CBC
Hemoglobin: 9.8 g/dL — ABNORMAL LOW (ref 13.0–17.0)
MCH: 29.2 pg (ref 26.0–34.0)
MCHC: 33.3 g/dL (ref 30.0–36.0)
RDW: 12.3 % (ref 11.5–15.5)
WBC: 11.8 10*3/uL — ABNORMAL HIGH (ref 4.0–10.5)

## 2010-03-25 LAB — URINE CULTURE
Colony Count: NO GROWTH
Culture  Setup Time: 201201310323

## 2010-03-28 ENCOUNTER — Emergency Department (HOSPITAL_COMMUNITY)
Admission: EM | Admit: 2010-03-28 | Discharge: 2010-03-28 | Disposition: A | Discharge status: Home / Self Care | Payer: BC Managed Care – PPO | Attending: Emergency Medicine | Admitting: Emergency Medicine

## 2010-03-28 DIAGNOSIS — T8140XA Infection following a procedure, unspecified, initial encounter: Secondary | ICD-10-CM | POA: Insufficient documentation

## 2010-03-28 DIAGNOSIS — Y838 Other surgical procedures as the cause of abnormal reaction of the patient, or of later complication, without mention of misadventure at the time of the procedure: Secondary | ICD-10-CM | POA: Insufficient documentation

## 2010-03-28 DIAGNOSIS — R509 Fever, unspecified: Secondary | ICD-10-CM | POA: Insufficient documentation

## 2010-03-28 LAB — DIFFERENTIAL
Basophils Relative: 0 % (ref 0–1)
Eosinophils Absolute: 0 10*3/uL (ref 0.0–0.7)
Eosinophils Relative: 0 % (ref 0–5)
Monocytes Absolute: 0.8 10*3/uL (ref 0.1–1.0)
Monocytes Relative: 7 % (ref 3–12)

## 2010-03-28 LAB — BASIC METABOLIC PANEL WITH GFR
BUN: 7 mg/dL (ref 6–23)
CO2: 28 meq/L (ref 19–32)
Calcium: 8.7 mg/dL (ref 8.4–10.5)
Chloride: 100 meq/L (ref 96–112)
Creatinine, Ser: 0.77 mg/dL (ref 0.4–1.5)
GFR calc non Af Amer: >60 mL/min
Glucose, Bld: 128 mg/dL — ABNORMAL HIGH (ref 70–99)
Potassium: 3.7 meq/L (ref 3.5–5.1)
Sodium: 136 meq/L (ref 135–145)

## 2010-03-28 LAB — CBC
MCH: 29.1 pg (ref 26.0–34.0)
MCHC: 33.5 g/dL (ref 30.0–36.0)
Platelets: 413 10*3/uL — ABNORMAL HIGH (ref 150–400)

## 2010-03-30 LAB — CULTURE, ROUTINE-ABSCESS

## 2010-04-02 LAB — CULTURE, BLOOD (ROUTINE X 2)

## 2010-04-03 LAB — CULTURE, BLOOD (ROUTINE X 2)
Culture  Setup Time: 201202041111
Culture: NO GROWTH
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
## 2010-04-21 ENCOUNTER — Encounter (HOSPITAL_COMMUNITY): Payer: BC Managed Care – PPO
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
## 2010-05-09 LAB — HEPATITIS B SURFACE ANTIGEN: Hepatitis B Surface Ag: NEGATIVE

## 2010-05-12 LAB — DIFFERENTIAL
Basophils Absolute: 0.1 10*3/uL (ref 0.0–0.1)
Eosinophils Relative: 0 % (ref 0–5)
Lymphocytes Relative: 12 % (ref 12–46)
Lymphs Abs: 1.1 10*3/uL (ref 0.7–4.0)
Monocytes Absolute: 0.6 10*3/uL (ref 0.1–1.0)
Monocytes Relative: 6 % (ref 3–12)

## 2010-05-12 LAB — CBC
HCT: 29.6 % — ABNORMAL LOW (ref 39.0–52.0)
Hemoglobin: 9.8 g/dL — ABNORMAL LOW (ref 13.0–17.0)
RBC: 3.74 MIL/uL — ABNORMAL LOW (ref 4.22–5.81)
RDW: 15.4 % (ref 11.5–15.5)

## 2010-05-18 LAB — CBC
HCT: 25 % — ABNORMAL LOW (ref 39.0–52.0)
HCT: 25.7 % — ABNORMAL LOW (ref 39.0–52.0)
HCT: 26.8 % — ABNORMAL LOW (ref 39.0–52.0)
HCT: 27.2 % — ABNORMAL LOW (ref 39.0–52.0)
HCT: 27.5 % — ABNORMAL LOW (ref 39.0–52.0)
HCT: 28 % — ABNORMAL LOW (ref 39.0–52.0)
HCT: 28.4 % — ABNORMAL LOW (ref 39.0–52.0)
HCT: 28.8 % — ABNORMAL LOW (ref 39.0–52.0)
HCT: 31.3 % — ABNORMAL LOW (ref 39.0–52.0)
HCT: 31.4 % — ABNORMAL LOW (ref 39.0–52.0)
HCT: 33.4 % — ABNORMAL LOW (ref 39.0–52.0)
HCT: 33.5 % — ABNORMAL LOW (ref 39.0–52.0)
HCT: 33.8 % — ABNORMAL LOW (ref 39.0–52.0)
Hemoglobin: 10 g/dL — ABNORMAL LOW (ref 13.0–17.0)
Hemoglobin: 10.1 g/dL — ABNORMAL LOW (ref 13.0–17.0)
Hemoglobin: 11 g/dL — ABNORMAL LOW (ref 13.0–17.0)
Hemoglobin: 11.2 g/dL — ABNORMAL LOW (ref 13.0–17.0)
Hemoglobin: 11.4 g/dL — ABNORMAL LOW (ref 13.0–17.0)
Hemoglobin: 11.5 g/dL — ABNORMAL LOW (ref 13.0–17.0)
Hemoglobin: 11.5 g/dL — ABNORMAL LOW (ref 13.0–17.0)
Hemoglobin: 11.7 g/dL — ABNORMAL LOW (ref 13.0–17.0)
Hemoglobin: 11.8 g/dL — ABNORMAL LOW (ref 13.0–17.0)
Hemoglobin: 8.4 g/dL — ABNORMAL LOW (ref 13.0–17.0)
Hemoglobin: 8.6 g/dL — ABNORMAL LOW (ref 13.0–17.0)
Hemoglobin: 8.9 g/dL — ABNORMAL LOW (ref 13.0–17.0)
Hemoglobin: 9.1 g/dL — ABNORMAL LOW (ref 13.0–17.0)
Hemoglobin: 9.4 g/dL — ABNORMAL LOW (ref 13.0–17.0)
Hemoglobin: 9.5 g/dL — ABNORMAL LOW (ref 13.0–17.0)
MCHC: 33.4 g/dL (ref 30.0–36.0)
MCHC: 33.7 g/dL (ref 30.0–36.0)
MCHC: 33.7 g/dL (ref 30.0–36.0)
MCHC: 33.8 g/dL (ref 30.0–36.0)
MCHC: 34 g/dL (ref 30.0–36.0)
MCHC: 34 g/dL (ref 30.0–36.0)
MCHC: 34.2 g/dL (ref 30.0–36.0)
MCHC: 34.3 g/dL (ref 30.0–36.0)
MCHC: 34.6 g/dL (ref 30.0–36.0)
MCV: 84.3 fL (ref 78.0–100.0)
MCV: 84.6 fL (ref 78.0–100.0)
MCV: 84.8 fL (ref 78.0–100.0)
MCV: 85.1 fL (ref 78.0–100.0)
MCV: 85.2 fL (ref 78.0–100.0)
MCV: 85.2 fL (ref 78.0–100.0)
MCV: 85.5 fL (ref 78.0–100.0)
MCV: 85.7 fL (ref 78.0–100.0)
MCV: 85.8 fL (ref 78.0–100.0)
MCV: 86 fL (ref 78.0–100.0)
MCV: 86 fL (ref 78.0–100.0)
Platelets: 164 10*3/uL (ref 150–400)
Platelets: 165 10*3/uL (ref 150–400)
Platelets: 196 10*3/uL (ref 150–400)
Platelets: 197 10*3/uL (ref 150–400)
Platelets: 199 10*3/uL (ref 150–400)
Platelets: 204 10*3/uL (ref 150–400)
Platelets: 304 10*3/uL (ref 150–400)
Platelets: 355 10*3/uL (ref 150–400)
Platelets: 374 10*3/uL (ref 150–400)
Platelets: 456 10*3/uL — ABNORMAL HIGH (ref 150–400)
Platelets: 609 10*3/uL — ABNORMAL HIGH (ref 150–400)
Platelets: 742 10*3/uL — ABNORMAL HIGH (ref 150–400)
RBC: 2.92 MIL/uL — ABNORMAL LOW (ref 4.22–5.81)
RBC: 2.96 MIL/uL — ABNORMAL LOW (ref 4.22–5.81)
RBC: 2.97 MIL/uL — ABNORMAL LOW (ref 4.22–5.81)
RBC: 3.1 MIL/uL — ABNORMAL LOW (ref 4.22–5.81)
RBC: 3.17 MIL/uL — ABNORMAL LOW (ref 4.22–5.81)
RBC: 3.25 MIL/uL — ABNORMAL LOW (ref 4.22–5.81)
RBC: 3.33 MIL/uL — ABNORMAL LOW (ref 4.22–5.81)
RBC: 3.45 MIL/uL — ABNORMAL LOW (ref 4.22–5.81)
RBC: 3.71 MIL/uL — ABNORMAL LOW (ref 4.22–5.81)
RBC: 3.78 MIL/uL — ABNORMAL LOW (ref 4.22–5.81)
RBC: 3.8 MIL/uL — ABNORMAL LOW (ref 4.22–5.81)
RBC: 3.91 MIL/uL — ABNORMAL LOW (ref 4.22–5.81)
RBC: 3.92 MIL/uL — ABNORMAL LOW (ref 4.22–5.81)
RBC: 3.92 MIL/uL — ABNORMAL LOW (ref 4.22–5.81)
RBC: 3.96 MIL/uL — ABNORMAL LOW (ref 4.22–5.81)
RBC: 3.99 MIL/uL — ABNORMAL LOW (ref 4.22–5.81)
RBC: 4.96 MIL/uL (ref 4.22–5.81)
RDW: 13.5 % (ref 11.5–15.5)
RDW: 13.7 % (ref 11.5–15.5)
RDW: 13.7 % (ref 11.5–15.5)
RDW: 13.8 % (ref 11.5–15.5)
RDW: 13.8 % (ref 11.5–15.5)
RDW: 13.9 % (ref 11.5–15.5)
RDW: 13.9 % (ref 11.5–15.5)
RDW: 14.1 % (ref 11.5–15.5)
RDW: 14.2 % (ref 11.5–15.5)
RDW: 14.3 % (ref 11.5–15.5)
RDW: 14.3 % (ref 11.5–15.5)
RDW: 14.5 % (ref 11.5–15.5)
RDW: 14.5 % (ref 11.5–15.5)
RDW: 14.6 % (ref 11.5–15.5)
RDW: 14.7 % (ref 11.5–15.5)
RDW: 14.8 % (ref 11.5–15.5)
WBC: 12.3 10*3/uL — ABNORMAL HIGH (ref 4.0–10.5)
WBC: 12.4 10*3/uL — ABNORMAL HIGH (ref 4.0–10.5)
WBC: 12.4 10*3/uL — ABNORMAL HIGH (ref 4.0–10.5)
WBC: 12.7 10*3/uL — ABNORMAL HIGH (ref 4.0–10.5)
WBC: 13.1 10*3/uL — ABNORMAL HIGH (ref 4.0–10.5)
WBC: 13.2 10*3/uL — ABNORMAL HIGH (ref 4.0–10.5)
WBC: 13.5 10*3/uL — ABNORMAL HIGH (ref 4.0–10.5)
WBC: 13.6 10*3/uL — ABNORMAL HIGH (ref 4.0–10.5)
WBC: 13.6 10*3/uL — ABNORMAL HIGH (ref 4.0–10.5)
WBC: 13.8 10*3/uL — ABNORMAL HIGH (ref 4.0–10.5)
WBC: 15.5 10*3/uL — ABNORMAL HIGH (ref 4.0–10.5)
WBC: 16.6 10*3/uL — ABNORMAL HIGH (ref 4.0–10.5)
WBC: 20.1 10*3/uL — ABNORMAL HIGH (ref 4.0–10.5)
WBC: 20.5 10*3/uL — ABNORMAL HIGH (ref 4.0–10.5)
WBC: 8.4 10*3/uL (ref 4.0–10.5)

## 2010-05-18 LAB — CULTURE, ROUTINE-ABSCESS
Culture: NO GROWTH
Culture: NO GROWTH
Gram Stain: NONE SEEN

## 2010-05-18 LAB — PROTIME-INR
INR: 1.66 — ABNORMAL HIGH (ref 0.00–1.49)
INR: 1.87 — ABNORMAL HIGH (ref 0.00–1.49)
INR: 2.16 — ABNORMAL HIGH (ref 0.00–1.49)
INR: 3.02 — ABNORMAL HIGH (ref 0.00–1.49)
Prothrombin Time: 15.5 seconds — ABNORMAL HIGH (ref 11.6–15.2)
Prothrombin Time: 21.4 seconds — ABNORMAL HIGH (ref 11.6–15.2)
Prothrombin Time: 23.9 seconds — ABNORMAL HIGH (ref 11.6–15.2)
Prothrombin Time: 31.1 seconds — ABNORMAL HIGH (ref 11.6–15.2)
Prothrombin Time: 31.5 seconds — ABNORMAL HIGH (ref 11.6–15.2)

## 2010-05-18 LAB — COMPREHENSIVE METABOLIC PANEL
ALT: 23 U/L (ref 0–53)
ALT: 41 U/L (ref 0–53)
ALT: 49 U/L (ref 0–53)
AST: 28 U/L (ref 0–37)
AST: 50 U/L — ABNORMAL HIGH (ref 0–37)
AST: 66 U/L — ABNORMAL HIGH (ref 0–37)
Albumin: 1.7 g/dL — ABNORMAL LOW (ref 3.5–5.2)
Albumin: 1.7 g/dL — ABNORMAL LOW (ref 3.5–5.2)
Albumin: 3.8 g/dL (ref 3.5–5.2)
Alkaline Phosphatase: 162 U/L — ABNORMAL HIGH (ref 39–117)
Alkaline Phosphatase: 56 U/L (ref 39–117)
Alkaline Phosphatase: 69 U/L (ref 39–117)
BUN: 7 mg/dL (ref 6–23)
BUN: 9 mg/dL (ref 6–23)
BUN: 9 mg/dL (ref 6–23)
CO2: 21 mEq/L (ref 19–32)
CO2: 27 mEq/L (ref 19–32)
CO2: 29 mEq/L (ref 19–32)
Calcium: 7.3 mg/dL — ABNORMAL LOW (ref 8.4–10.5)
Calcium: 8.2 mg/dL — ABNORMAL LOW (ref 8.4–10.5)
Chloride: 101 mEq/L (ref 96–112)
Chloride: 104 mEq/L (ref 96–112)
Chloride: 108 mEq/L (ref 96–112)
Chloride: 97 mEq/L (ref 96–112)
Creatinine, Ser: 0.67 mg/dL (ref 0.4–1.5)
Creatinine, Ser: 0.76 mg/dL (ref 0.4–1.5)
Creatinine, Ser: 1.13 mg/dL (ref 0.4–1.5)
GFR calc Af Amer: >60 mL/min (ref >60)
GFR calc Af Amer: >60 mL/min (ref >60)
GFR calc Af Amer: >60 mL/min (ref >60)
GFR calc non Af Amer: >60 mL/min (ref >60)
GFR calc non Af Amer: >60 mL/min (ref >60)
GFR calc non Af Amer: >60 mL/min (ref >60)
Glucose, Bld: 108 mg/dL — ABNORMAL HIGH (ref 70–99)
Glucose, Bld: 139 mg/dL — ABNORMAL HIGH (ref 70–99)
Potassium: 3.3 mEq/L — ABNORMAL LOW (ref 3.5–5.1)
Potassium: 3.3 mEq/L — ABNORMAL LOW (ref 3.5–5.1)
Potassium: 3.9 mEq/L (ref 3.5–5.1)
Potassium: 4 mEq/L (ref 3.5–5.1)
Sodium: 136 mEq/L (ref 135–145)
Sodium: 138 mEq/L (ref 135–145)
Sodium: 139 mEq/L (ref 135–145)
Total Bilirubin: 0.3 mg/dL (ref 0.3–1.2)
Total Bilirubin: 0.4 mg/dL (ref 0.3–1.2)
Total Protein: 4.2 g/dL — ABNORMAL LOW (ref 6.0–8.3)
Total Protein: 4.8 g/dL — ABNORMAL LOW (ref 6.0–8.3)

## 2010-05-18 LAB — BASIC METABOLIC PANEL
BUN: 10 mg/dL (ref 6–23)
BUN: 2 mg/dL — ABNORMAL LOW (ref 6–23)
BUN: 5 mg/dL — ABNORMAL LOW (ref 6–23)
BUN: 6 mg/dL (ref 6–23)
BUN: 8 mg/dL (ref 6–23)
CO2: 26 mEq/L (ref 19–32)
CO2: 27 mEq/L (ref 19–32)
CO2: 29 mEq/L (ref 19–32)
CO2: 31 mEq/L (ref 19–32)
Calcium: 6.4 mg/dL — CL (ref 8.4–10.5)
Calcium: 6.5 mg/dL — ABNORMAL LOW (ref 8.4–10.5)
Calcium: 7.2 mg/dL — ABNORMAL LOW (ref 8.4–10.5)
Calcium: 7.3 mg/dL — ABNORMAL LOW (ref 8.4–10.5)
Calcium: 7.4 mg/dL — ABNORMAL LOW (ref 8.4–10.5)
Calcium: 7.4 mg/dL — ABNORMAL LOW (ref 8.4–10.5)
Calcium: 7.7 mg/dL — ABNORMAL LOW (ref 8.4–10.5)
Chloride: 102 mEq/L (ref 96–112)
Chloride: 103 mEq/L (ref 96–112)
Chloride: 97 mEq/L (ref 96–112)
Creatinine, Ser: 0.63 mg/dL (ref 0.4–1.5)
Creatinine, Ser: 0.67 mg/dL (ref 0.4–1.5)
Creatinine, Ser: 0.67 mg/dL (ref 0.4–1.5)
Creatinine, Ser: 1.18 mg/dL (ref 0.4–1.5)
Creatinine, Ser: 1.19 mg/dL (ref 0.4–1.5)
GFR calc Af Amer: >60 mL/min (ref >60)
GFR calc Af Amer: >60 mL/min (ref >60)
GFR calc Af Amer: >60 mL/min (ref >60)
GFR calc Af Amer: >60 mL/min (ref >60)
GFR calc Af Amer: >60 mL/min (ref >60)
GFR calc non Af Amer: >60 mL/min (ref >60)
GFR calc non Af Amer: >60 mL/min (ref >60)
GFR calc non Af Amer: >60 mL/min (ref >60)
GFR calc non Af Amer: >60 mL/min (ref >60)
GFR calc non Af Amer: >60 mL/min (ref >60)
GFR calc non Af Amer: >60 mL/min (ref >60)
GFR calc non Af Amer: >60 mL/min (ref >60)
GFR calc non Af Amer: >60 mL/min (ref >60)
GFR calc non Af Amer: >60 mL/min (ref >60)
GFR calc non Af Amer: >60 mL/min (ref >60)
Glucose, Bld: 108 mg/dL — ABNORMAL HIGH (ref 70–99)
Glucose, Bld: 114 mg/dL — ABNORMAL HIGH (ref 70–99)
Glucose, Bld: 126 mg/dL — ABNORMAL HIGH (ref 70–99)
Glucose, Bld: 133 mg/dL — ABNORMAL HIGH (ref 70–99)
Glucose, Bld: 145 mg/dL — ABNORMAL HIGH (ref 70–99)
Glucose, Bld: 148 mg/dL — ABNORMAL HIGH (ref 70–99)
Glucose, Bld: 189 mg/dL — ABNORMAL HIGH (ref 70–99)
Glucose, Bld: 98 mg/dL (ref 70–99)
Potassium: 3.2 mEq/L — ABNORMAL LOW (ref 3.5–5.1)
Potassium: 3.4 mEq/L — ABNORMAL LOW (ref 3.5–5.1)
Potassium: 3.6 mEq/L (ref 3.5–5.1)
Potassium: 3.8 mEq/L (ref 3.5–5.1)
Potassium: 4.1 mEq/L (ref 3.5–5.1)
Potassium: 4.2 mEq/L (ref 3.5–5.1)
Potassium: 4.8 mEq/L (ref 3.5–5.1)
Sodium: 128 mEq/L — ABNORMAL LOW (ref 135–145)
Sodium: 134 mEq/L — ABNORMAL LOW (ref 135–145)
Sodium: 135 mEq/L (ref 135–145)
Sodium: 136 mEq/L (ref 135–145)
Sodium: 136 mEq/L (ref 135–145)
Sodium: 137 mEq/L (ref 135–145)
Sodium: 137 mEq/L (ref 135–145)
Sodium: 137 mEq/L (ref 135–145)
Sodium: 145 mEq/L (ref 135–145)

## 2010-05-18 LAB — ANAEROBIC CULTURE: Gram Stain: NONE SEEN

## 2010-05-18 LAB — DIFFERENTIAL
Basophils Absolute: 0 10*3/uL (ref 0.0–0.1)
Basophils Absolute: 0 10*3/uL (ref 0.0–0.1)
Basophils Absolute: 0 10*3/uL (ref 0.0–0.1)
Basophils Relative: 0 % (ref 0–1)
Basophils Relative: 0 % (ref 0–1)
Basophils Relative: 0 % (ref 0–1)
Eosinophils Absolute: 0 10*3/uL (ref 0.0–0.7)
Eosinophils Absolute: 0 10*3/uL (ref 0.0–0.7)
Eosinophils Relative: 0 % (ref 0–5)
Eosinophils Relative: 0 % (ref 0–5)
Lymphocytes Relative: 2 % — ABNORMAL LOW (ref 12–46)
Lymphocytes Relative: 3 % — ABNORMAL LOW (ref 12–46)
Lymphs Abs: 0.4 10*3/uL — ABNORMAL LOW (ref 0.7–4.0)
Monocytes Absolute: 0.3 10*3/uL (ref 0.1–1.0)
Monocytes Absolute: 0.3 10*3/uL (ref 0.1–1.0)
Monocytes Relative: 4 % (ref 3–12)
Neutro Abs: 11.9 10*3/uL — ABNORMAL HIGH (ref 1.7–7.7)
Neutro Abs: 3.7 10*3/uL (ref 1.7–7.7)
Neutro Abs: 5.8 10*3/uL (ref 1.7–7.7)
Neutrophils Relative %: 89 % — ABNORMAL HIGH (ref 43–77)
Neutrophils Relative %: 94 % — ABNORMAL HIGH (ref 43–77)

## 2010-05-18 LAB — PREALBUMIN: Prealbumin: 5.2 mg/dL — ABNORMAL LOW (ref 18.0–45.0)

## 2010-05-18 LAB — URINALYSIS, ROUTINE W REFLEX MICROSCOPIC
Bilirubin Urine: NEGATIVE
Glucose, UA: NEGATIVE mg/dL
Nitrite: NEGATIVE
Specific Gravity, Urine: 1.024 (ref 1.005–1.030)
pH: 6 (ref 5.0–8.0)

## 2010-05-18 LAB — MAGNESIUM
Magnesium: 2 mg/dL (ref 1.5–2.5)
Magnesium: 2.1 mg/dL (ref 1.5–2.5)

## 2010-05-18 LAB — TYPE AND SCREEN: Antibody Screen: NEGATIVE

## 2010-05-18 LAB — GLUCOSE, CAPILLARY
Glucose-Capillary: 109 mg/dL — ABNORMAL HIGH (ref 70–99)
Glucose-Capillary: 117 mg/dL — ABNORMAL HIGH (ref 70–99)
Glucose-Capillary: 129 mg/dL — ABNORMAL HIGH (ref 70–99)
Glucose-Capillary: 135 mg/dL — ABNORMAL HIGH (ref 70–99)
Glucose-Capillary: 136 mg/dL — ABNORMAL HIGH (ref 70–99)
Glucose-Capillary: 140 mg/dL — ABNORMAL HIGH (ref 70–99)

## 2010-05-18 LAB — TRIGLYCERIDES
Triglycerides: 123 mg/dL (ref <150)
Triglycerides: 86 mg/dL (ref <150)

## 2010-05-18 LAB — URINE MICROSCOPIC-ADD ON

## 2010-05-18 LAB — BODY FLUID CULTURE

## 2010-05-18 LAB — ABO/RH: ABO/RH(D): O POS

## 2010-05-18 LAB — LACTIC ACID, PLASMA: Lactic Acid, Venous: 3.8 mmol/L — ABNORMAL HIGH (ref 0.5–2.2)

## 2010-05-18 LAB — APTT: aPTT: 22 seconds — ABNORMAL LOW (ref 24–37)

## 2010-05-18 LAB — PHOSPHORUS: Phosphorus: 3.8 mg/dL (ref 2.3–4.6)

## 2010-06-24 ENCOUNTER — Encounter (HOSPITAL_COMMUNITY): Payer: BC Managed Care – PPO | Attending: Gastroenterology

## 2010-06-24 DIAGNOSIS — K5090 Crohn's disease, unspecified, without complications: Secondary | ICD-10-CM | POA: Insufficient documentation

## 2010-07-07 NOTE — Discharge Summary (Signed)
NAMECHRISOPHER, [name]                ACCOUNT NO.:  [id number]   MEDICAL RECORD NO.:  [id number]          PATIENT TYPE:  INP   LOCATION:  1610                         FACILITY:  [facility]   PHYSICIAN:  Earle Gell, M.D.   DATE OF BIRTH:  Oct 31, 1970   DATE OF ADMISSION:  07/24/2006  DATE OF DISCHARGE:  07/27/2006                               DISCHARGE SUMMARY   DISCHARGE DIAGNOSES:  1. 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  2. Drained perirectal abscess.  3. Microcytic anemia.   DISCHARGE MEDICATIONS:  1. Augmentin 875 mg once daily for 5 days.  2. Prednisone 40 mg each morning for 2 weeks, 35 mg each morning for 1      week, 30 mg each morning for 1 week, 25 mg each morning for 1 week,      20 mg each morning for 1 week, 15 mg each morning for 1 week, 10 mg      each morning for 1 week.   HOSPITAL FOLLOWUP:  I will see [name] in my office in approximately 2  weeks.   HOSPITAL COURSE:  Mr. [name] is a 40 year old male born [birth year].   In 1991, he was diagnosed with left-sided ulcerative colitis at [facility].   In 1997, he underwent a flexible proctosigmoidoscopy performed to 60 cm  by me in my office which revealed universal proctocolitis extending  proximal to 60 cm.   Mr. [name] was admitted to the hospital on July 24, 2006 to evaluate and  treat vomiting, bloody diarrhea, and a perirectal abscess.   For his ulcerative colitis, he was started on intravenous Solu-Medrol.  He was transitioned from intravenous Solu-Medrol to prednisone 40 mg  each morning, and his nausea and vomiting resolved.  He is tolerating  prednisone without apparent side effects.  He is tolerating a regular  diet without nausea or vomiting.  His stool screen for C. difficile  toxin was negative.  Stool culture was never sent although it was  requested on admission.   For his perirectal abscess, he was evaluated by Dr. Kaylyn Lim and the  abscess  drained without difficulty.  I do not think the perirectal  abscess indicates that he has Crohn's disease.  He will receive  Augmentin 875 mg twice daily for an additional 5 days at discharge.  Mr.  [name] is discharged in stable medical condition.  I will evaluate him in  my office in approximately 2 weeks.  He will begin a prednisone taper in  2 weeks.  I will add mesalamine.   LABORATORY DATA:  C. difficile toxin screen of the stool negative.  Complete metabolic profile was normal except for an elevated random  blood glucose (154) on intravenous Solu-Medrol.  Serum albumin was low  at 2.6  grams per dL.  Discharge white blood cell count 11,300, hemoglobin 9.9  grams, MCV 78.8, normal platelet count.  Admission acute abdominal x-ray  series revealed edema of the transverse colon.  Urinalysis on  admission showed moderate bilirubin.  ______________________________  Earle Gell, M.D.     MJ/MEDQ  D:  07/27/2006  T:  07/27/2006  Job:  [id number]   cc:   Isabel Caprice Hassell Done, [place] [address]  [place]  [place] [address]   Delanna Ahmadi, M.D.  Fax: [phone]

## 2010-07-07 NOTE — H&P (Signed)
[name]                ACCOUNT NO.:  [id number]   MEDICAL RECORD NO.:  [id number]          PATIENT TYPE:  INP   LOCATION:  0101                         FACILITY:  [facility]   PHYSICIAN:  Annita Brod, M.D.DATE OF BIRTH:  05-May-1970   DATE OF ADMISSION:  07/24/2006  DATE OF DISCHARGE:                              HISTORY & PHYSICAL   ADDENDUM:   PHYSICAL EXAMINATION:  HEART:  Regular rate and rhythm.  S1-S2 with in  addition what sounds to be a third beat, possibly an S4 gallop, although  the patient tells me that this he has had all his life and has been told  about this before.  LUNGS:  Clear to auscultation bilaterally.  ABDOMEN:  Soft, nontender, and nondistended.  Decreased bowel sounds.  EXTREMITIES:  Show no clubbing, cyanosis, or edema.  In the perirectal  area, he has a small raised bump that is under the skin, whitish in  appearance and extremely tender so I did not palpate this closely.  This  appears to be hemorrhoid versus a small perhaps tiny perirectal abscess.   LAB WORK:  White count 11.8, H&H 12 and 36, MCV of 78, platelet count of  352, 88% neutrophils.  Sodium 137, potassium 3.2, chloride 100, bicarb  25, BUN 4, creatinine 0.8, glucose 114.  LFTs are unremarkable except  for an albumin of 2.6.  Urinalysis is pending.   ASSESSMENT/PLAN:  1. Ulcerative colitis:  Given the patient's severe acute symptoms on      initial presentation, although now he looks comfortable, we will      put him on steroids plus sulfasalazine for now.  We will check      follow-up abdominal films in the morning.  2. Perirectal area possible concerning for small abscess:  Discussed      with the emergency room attending.  Agreed to best cover.  We will      put him on intravenous vancomycin and given his acute flare up of      symptoms, best not to drain it now with him having bloody stooling      which could lead to worsening infection but a possible surgery  evaluation may be recommended in the next few days once this      ulcerative colitis has settled down.  3. Microcytic anemia:  This is likely secondary to chronic disease.  4. Protein calorie malnutrition from the patient's p.o. intake for the      last few days:  This should improve once he is better.  5. Hypokalemia:  We will replace.      Annita Brod, M.D.  Electronically Signed     SKK/MEDQ  D:  07/24/2006  T:  07/24/2006  Job:  [id number]   cc:   Delanna Ahmadi, M.D.  Fax: [phone]   Patient's chart

## 2010-07-07 NOTE — H&P (Signed)
[name], [name]                ACCOUNT NO.:  [id number]   MEDICAL RECORD NO.:  [id number]          PATIENT TYPE:  EMS   LOCATION:  ED                           [facility]ACILITY:  [facility]   PHYSICIAN:  Annita Brod, M.D.DATE OF BIRTH:  [birth year]   DATE OF ADMISSION:  07/24/2006  DATE OF DISCHARGE:                              HISTORY & PHYSICAL   PRIMARY CARE PHYSICIAN:  Delanna Ahmadi, M.D.   CHIEF COMPLAINT:  Vomiting and bloody diarrhea.   HISTORY OF PRESENT ILLNESS:  The patient is a 40 year old white male  with a past medical history of tobacco abuse and ulcerative colitis who  presents to the emergency room after having a flare-up.  Overall, his  flare-ups have been few and far between.  In a total of 16 years, he has  had 4 flare-ups, this being his fourth, and the last one was  approximately 2 years ago.  The patient has been doing relatively well  up until about 3 weeks ago when he started noticing a boil on his inner  buttocks, and then in the past week he started having problems with  nausea, vomiting and minimal bloody diarrhea.  He saw his primary care  physician and was started on Lialda which he said did not improve his  symptoms at all; in fact, he symptoms seemed to worsen, so he came in to  the emergency room.  In the emergency room, he was evaluated both for  this ulcerative colitis.  His white count was noted to be 11.8.  His  heart rate initially was in the 120s secondary to pain and discomfort.  He was given Dilaudid and Zofran which greatly seemed to improve his  symptoms, as well as IV Solu-Medrol for his ulcerative colitis.  The ER  attending was also concerned about the possibility of a small rectal  abscess in regards to the boil which is located around the perirectal  area.  A whitened, slightly raised area was extremely tender and had  been going on for several weeks.  The patient currently is doing better.  He denies any headaches, vision changes,  dysphagia, chest pain,  palpitations, shortness of breath, wheezing, coughing.  No abdominal  pain.  No current nausea or vomiting.  No hematuria or dysuria.  No  constipation.  No focal extremity numbness, weakness or pain.  The area  around his perirectal area is extremely tender and exquisite, especially  at the one raised area concerning for a possible abscess of hemorrhoid.   REVIEW OF SYSTEMS:  Otherwise negative.   PAST MEDICAL HISTORY:  1. Tobacco abuse.  2. Ulcerative colitis.   MEDICATIONS:  He has been recently started on Lialda for this ulcerative  colitis, but is not on any baseline medications.   ALLERGIES:  He has no known drug allergies.   SOCIAL HISTORY:  He smokes about a half pack a day.  Socially drinks.  No drug use.   FAMILY HISTORY:  Negative.   PHYSICAL EXAMINATION:  VITAL SIGNS:  The patient's vitals on admission  revealed a temperature of 98.4, heart  rate 127, blood pressure 113/72,  respirations 18, O2 saturation 96% on room air.  Since then, his heart  rate has come down to 87.  GENERAL:  The patient is alert and oriented x3 in no apparent distress.  HEENT:  Normocephalic and atraumatic.  His mucous membranes are moist.  He has no carotid bruits.  HEART:  Regular rate and rhythm.  S1 and S2 with what sounds to be an  additional S4 murmur.  ABDOMEN:  Soft, nondistended.  Positive bowel sounds.      Annita Brod, M.D.  Electronically Signed     SKK/MEDQ  D:  07/24/2006  T:  07/24/2006  Job:  [id number]

## 2010-08-19 ENCOUNTER — Ambulatory Visit (HOSPITAL_COMMUNITY): Payer: BC Managed Care – PPO | Attending: Gastroenterology

## 2010-08-19 DIAGNOSIS — K5090 Crohn's disease, unspecified, without complications: Secondary | ICD-10-CM | POA: Insufficient documentation

## 2010-10-14 ENCOUNTER — Encounter (HOSPITAL_COMMUNITY): Payer: BC Managed Care – PPO

## 2010-10-27 ENCOUNTER — Encounter (HOSPITAL_COMMUNITY): Payer: BC Managed Care – PPO | Attending: Gastroenterology

## 2010-10-27 DIAGNOSIS — K5090 Crohn's disease, unspecified, without complications: Secondary | ICD-10-CM | POA: Insufficient documentation

## 2010-12-10 LAB — DIFFERENTIAL
Basophils Absolute: 0
Basophils Absolute: 0
Basophils Relative: 0
Eosinophils Absolute: 0
Eosinophils Relative: 0
Lymphocytes Relative: 5 — ABNORMAL LOW
Lymphs Abs: 0.6 — ABNORMAL LOW
Monocytes Absolute: 0.1 — ABNORMAL LOW
Monocytes Absolute: 0.9 — ABNORMAL HIGH
Monocytes Relative: 1 — ABNORMAL LOW
Neutro Abs: 10.3 — ABNORMAL HIGH
Neutro Abs: 10.7 — ABNORMAL HIGH

## 2010-12-10 LAB — CBC
HCT: 30 — ABNORMAL LOW
HCT: 36.5 — ABNORMAL LOW
Hemoglobin: 10.7 — ABNORMAL LOW
Hemoglobin: 9.9 — ABNORMAL LOW
MCHC: 32.9
MCHC: 33
MCHC: 33.1
MCV: 78.2
MCV: 78.3
MCV: 78.8
Platelets: 352
Platelets: 384
RBC: 4.13 — ABNORMAL LOW
RDW: 14.4 — ABNORMAL HIGH
RDW: 14.4 — ABNORMAL HIGH
RDW: 14.6 — ABNORMAL HIGH

## 2010-12-10 LAB — COMPREHENSIVE METABOLIC PANEL
Albumin: 2.6 — ABNORMAL LOW
BUN: 4 — ABNORMAL LOW
Calcium: 8.7
Chloride: 100
Creatinine, Ser: 0.86
GFR calc Af Amer: >60
Total Bilirubin: 0.8
Total Protein: 7

## 2010-12-10 LAB — URINALYSIS, ROUTINE W REFLEX MICROSCOPIC
Hgb urine dipstick: NEGATIVE
Ketones, ur: 15 — AB
Protein, ur: 30 — AB
Urobilinogen, UA: 1

## 2010-12-10 LAB — BASIC METABOLIC PANEL
BUN: 5 — ABNORMAL LOW
CO2: 26
Chloride: 110
GFR calc non Af Amer: >60
Glucose, Bld: 154 — ABNORMAL HIGH
Potassium: 4.5
Sodium: 141

## 2010-12-10 LAB — URINE MICROSCOPIC-ADD ON

## 2010-12-10 LAB — CLOSTRIDIUM DIFFICILE EIA: C difficile Toxins A+B, EIA: NEGATIVE

## 2010-12-21 ENCOUNTER — Other Ambulatory Visit (INDEPENDENT_AMBULATORY_CARE_PROVIDER_SITE_OTHER): Payer: Self-pay | Admitting: General Surgery

## 2010-12-26 ENCOUNTER — Other Ambulatory Visit (HOSPITAL_COMMUNITY): Payer: Self-pay | Admitting: *Deleted

## 2010-12-28 ENCOUNTER — Encounter (HOSPITAL_COMMUNITY)
Admission: RE | Admit: 2010-12-28 | Discharge: 2010-12-28 | Disposition: A | Discharge status: Home / Self Care | Payer: BC Managed Care – PPO | Source: Ambulatory Visit | Attending: Gastroenterology | Admitting: Gastroenterology

## 2010-12-28 DIAGNOSIS — K5090 Crohn's disease, unspecified, without complications: Secondary | ICD-10-CM | POA: Insufficient documentation

## 2010-12-28 MED ORDER — LORATADINE 10 MG PO TABS: 10.0000 mg | ORAL_TABLET | Freq: Every day | ORAL | Status: DC

## 2010-12-28 MED ORDER — ACETAMINOPHEN 500 MG PO TABS
1000.0000 mg | ORAL_TABLET | Freq: Once | ORAL | Status: AC
  Administered 2010-12-28: 1000 mg via ORAL
  Filled 2010-12-28: qty 2

## 2010-12-28 MED ORDER — SODIUM CHLORIDE 0.9 % IV SOLN
400.0000 mg | Freq: Once | INTRAVENOUS | Status: AC
  Administered 2010-12-28: 400 mg via INTRAVENOUS
  Filled 2010-12-28: qty 40

## 2010-12-28 MED ORDER — METHYLPREDNISOLONE SODIUM SUCC 40 MG IJ SOLR
20.0000 mg | Freq: Once | INTRAMUSCULAR | Status: AC
  Administered 2010-12-28: 20 mg via INTRAVENOUS
  Filled 2010-12-28: qty 1

## 2010-12-28 MED ORDER — SODIUM CHLORIDE 0.9 % IV SOLN
INTRAVENOUS | Status: DC
  Administered 2010-12-28: 14:00:00 via INTRAVENOUS

## 2011-01-04 NOTE — Telephone Encounter (Signed)
I called and gave denial of refill per TR.

## 2011-03-01 ENCOUNTER — Encounter (HOSPITAL_COMMUNITY): Admission: RE | Admit: 2011-03-01 | Payer: BC Managed Care – PPO | Source: Ambulatory Visit

## 2011-03-02 ENCOUNTER — Other Ambulatory Visit (HOSPITAL_COMMUNITY): Payer: Self-pay | Admitting: *Deleted

## 2011-03-05 ENCOUNTER — Encounter (HOSPITAL_COMMUNITY): Payer: Self-pay

## 2011-03-05 ENCOUNTER — Encounter (HOSPITAL_COMMUNITY)
Admission: RE | Admit: 2011-03-05 | Discharge: 2011-03-05 | Disposition: A | Discharge status: Home / Self Care | Payer: BC Managed Care – PPO | Source: Ambulatory Visit | Attending: Gastroenterology | Admitting: Gastroenterology

## 2011-03-05 DIAGNOSIS — K5090 Crohn's disease, unspecified, without complications: Secondary | ICD-10-CM | POA: Insufficient documentation

## 2011-03-05 HISTORY — DX: Depression, unspecified: F32.A

## 2011-03-05 HISTORY — DX: Other pulmonary embolism without acute cor pulmonale: I26.99

## 2011-03-05 HISTORY — DX: Crohn's disease of large intestine without complications: K50.10

## 2011-03-05 HISTORY — DX: Major depressive disorder, single episode, unspecified: F32.9

## 2011-03-05 MED ORDER — ACETAMINOPHEN 500 MG PO TABS
1000.0000 mg | ORAL_TABLET | Freq: Once | ORAL | Status: AC
  Administered 2011-03-05: 975 mg via ORAL
  Filled 2011-03-05: qty 2

## 2011-03-05 MED ORDER — SODIUM CHLORIDE 0.9 % IV SOLN
INTRAVENOUS | Status: AC
  Administered 2011-03-05: 13:00:00 via INTRAVENOUS

## 2011-03-05 MED ORDER — METHYLPREDNISOLONE SODIUM SUCC 40 MG IJ SOLR
20.0000 mg | Freq: Once | INTRAMUSCULAR | Status: AC
  Administered 2011-03-05: 20 mg via INTRAVENOUS

## 2011-03-05 MED ORDER — SODIUM CHLORIDE 0.9 % IV SOLN
400.0000 mg | Freq: Once | INTRAVENOUS | Status: AC
  Administered 2011-03-05: 400 mg via INTRAVENOUS
  Filled 2011-03-05: qty 40

## 2011-04-26 ENCOUNTER — Encounter (HOSPITAL_COMMUNITY)
Admission: RE | Admit: 2011-04-26 | Discharge: 2011-04-26 | Disposition: A | Discharge status: Home / Self Care | Payer: BC Managed Care – PPO | Source: Ambulatory Visit | Attending: Gastroenterology | Admitting: Gastroenterology

## 2011-04-26 DIAGNOSIS — K5090 Crohn's disease, unspecified, without complications: Secondary | ICD-10-CM | POA: Insufficient documentation

## 2011-05-03 ENCOUNTER — Encounter (HOSPITAL_COMMUNITY): Payer: Self-pay

## 2011-05-03 ENCOUNTER — Encounter (HOSPITAL_COMMUNITY)
Admission: RE | Admit: 2011-05-03 | Discharge: 2011-05-03 | Disposition: A | Discharge status: Home / Self Care | Payer: BC Managed Care – PPO | Source: Ambulatory Visit | Attending: Gastroenterology | Admitting: Gastroenterology

## 2011-05-03 DIAGNOSIS — K5090 Crohn's disease, unspecified, without complications: Secondary | ICD-10-CM | POA: Insufficient documentation

## 2011-05-03 MED ORDER — SODIUM CHLORIDE 0.9 % IV SOLN
400.0000 mg | Freq: Once | INTRAVENOUS | Status: AC
  Administered 2011-05-03: 400 mg via INTRAVENOUS
  Filled 2011-05-03: qty 40

## 2011-05-03 MED ORDER — METHYLPREDNISOLONE SODIUM SUCC 125 MG IJ SOLR
20.0000 mg | Freq: Once | INTRAMUSCULAR | Status: AC
  Administered 2011-05-03: 20 mg via INTRAVENOUS
  Filled 2011-05-03: qty 0.32

## 2011-05-03 MED ORDER — ACETAMINOPHEN 500 MG PO TABS
1000.0000 mg | ORAL_TABLET | ORAL | Status: DC
  Administered 2011-05-03: 1000 mg via ORAL

## 2011-05-03 MED ORDER — SODIUM CHLORIDE 0.9 % IV SOLN
Freq: Once | INTRAVENOUS | Status: AC
  Administered 2011-05-03: 100 mL via INTRAVENOUS

## 2011-05-03 NOTE — Discharge Instructions (Signed)
Next appointment may 6th at 1200Infliximab injection What is this medicine? INFLIXIMAB (in [place] i mab) is used to treat Crohn's disease and ulcerative colitis. It is also used to treat ankylosing spondylitis, psoriasis, and some forms of arthritis. This medicine may be used for other purposes; ask your health care provider or pharmacist if you have questions. What should I tell my health care provider before I take this medicine? They need to know if you have any of these conditions: -diabetes -exposure to tuberculosis -heart failure -hepatitis or liver disease -immune system problems -infection -lung or breathing disease, like COPD -multiple sclerosis -current or past resident of [name] or [place] -seizure disorder -an unusual or allergic reaction to infliximab, mouse proteins, other medicines, foods, dyes, or preservatives -pregnant or trying to get pregnant -breast-feeding How should I use this medicine? This medicine is for injection into a vein. It is usually given by a health care professional in a hospital or clinic setting. A special MedGuide will be given to you by the pharmacist with each prescription and refill. Be sure to read this information carefully each time. Talk to your pediatrician regarding the use of this medicine in children. Special care may be needed. Overdosage: If you think you have taken too much of this medicine contact a poison control center or emergency room at once. NOTE: This medicine is only for you. Do not share this medicine with others. What if I miss a dose? It is important not to miss your dose. Call your doctor or health care professional if you are unable to keep an appointment. What may interact with this medicine? Do not take this medicine with any of the following medications: -anakinra -rilonacept This medicine may also interact with the following medications: -vaccines This list may not describe all possible interactions.  Give your health care provider a list of all the medicines, herbs, non-prescription drugs, or dietary supplements you use. Also tell them if you smoke, drink alcohol, or use illegal drugs. Some items may interact with your medicine. What should I watch for while using this medicine? Visit your doctor or health care professional for regular checks on your progress. If you get a cold or other infection while receiving this medicine, call your doctor or health care professional. Do not treat yourself. This medicine may decrease your body's ability to fight infections. Before beginning therapy, your doctor may do a test to see if you have been exposed to tuberculosis. This medicine may make the symptoms of heart failure worse in some patients. If you notice symptoms such as increased shortness of breath or swelling of the ankles or legs, contact your health care provider right away. If you are going to have surgery or dental work, tell your health care professional or dentist that you have received this medicine. If you take this medicine for plaque psoriasis, stay out of the sun. If you cannot avoid being in the sun, wear protective clothing and use sunscreen. Do not use sun lamps or tanning beds/booths. What side effects may I notice from receiving this medicine? Side effects that you should report to your doctor or health care professional as soon as possible: -allergic reactions like skin rash, itching or hives, swelling of the face, lips, or tongue -chest pain -fever or chills, usually related to the infusion -muscle or joint pain -red, scaly patches or raised bumps on the skin -signs of infection - fever or chills, cough, sore throat, pain or difficulty passing urine -swollen lymph  nodes in the neck, underarm, or groin areas -unexplained weight loss -unusual bleeding or bruising -unusually weak or tired -yellowing of the eyes or skin Side effects that usually do not require medical attention  (report to your doctor or health care professional if they continue or are bothersome): -headache -heartburn or stomach pain -nausea, vomiting This list may not describe all possible side effects. Call your doctor for medical advice about side effects. You may report side effects to FDA at 1-800-FDA-1088. Where should I keep my medicine? This drug is given in a hospital or clinic and will not be stored at home. NOTE: This sheet is a summary. It may not cover all possible information. If you have questions about this medicine, talk to your doctor, pharmacist, or health care provider.  2012, Elsevier/Gold Standard. (09/27/2007 10:26:02 AM)

## 2011-06-28 ENCOUNTER — Encounter (HOSPITAL_COMMUNITY)
Admission: RE | Admit: 2011-06-28 | Discharge: 2011-06-28 | Disposition: A | Discharge status: Home / Self Care | Payer: BC Managed Care – PPO | Source: Ambulatory Visit | Attending: Gastroenterology | Admitting: Gastroenterology

## 2011-06-28 ENCOUNTER — Encounter (HOSPITAL_COMMUNITY): Admission: RE | Admit: 2011-06-28 | Payer: BC Managed Care – PPO | Source: Ambulatory Visit

## 2011-06-28 ENCOUNTER — Encounter (HOSPITAL_COMMUNITY): Payer: Self-pay

## 2011-06-28 DIAGNOSIS — K5090 Crohn's disease, unspecified, without complications: Secondary | ICD-10-CM | POA: Insufficient documentation

## 2011-06-28 MED ORDER — METHYLPREDNISOLONE SODIUM SUCC 40 MG IJ SOLR
INTRAMUSCULAR | Status: AC
  Administered 2011-06-28: 20 mg via INTRAVENOUS
  Filled 2011-06-28: qty 1

## 2011-06-28 MED ORDER — METHYLPREDNISOLONE SODIUM SUCC 40 MG IJ SOLR
20.0000 mg | Freq: Once | INTRAMUSCULAR | Status: AC
  Administered 2011-06-28: 20 mg via INTRAVENOUS

## 2011-06-28 MED ORDER — SODIUM CHLORIDE 0.9 % IV SOLN
Freq: Once | INTRAVENOUS | Status: AC
  Administered 2011-06-28: 13:00:00 via INTRAVENOUS

## 2011-06-28 MED ORDER — ACETAMINOPHEN 500 MG PO TABS
ORAL_TABLET | ORAL | Status: AC
  Administered 2011-06-28: 1000 mg via ORAL
  Filled 2011-06-28: qty 2

## 2011-06-28 MED ORDER — SODIUM CHLORIDE 0.9 % IV SOLN
400.0000 mg | Freq: Once | INTRAVENOUS | Status: AC
  Administered 2011-06-28: 400 mg via INTRAVENOUS
  Filled 2011-06-28: qty 40

## 2011-06-28 MED ORDER — ACETAMINOPHEN 500 MG PO TABS
1000.0000 mg | ORAL_TABLET | Freq: Once | ORAL | Status: AC
  Administered 2011-06-28: 1000 mg via ORAL

## 2011-06-28 NOTE — Discharge Instructions (Signed)
Infliximab injection What is this medicine? INFLIXIMAB (in [place] i mab) is used to treat Crohn's disease and ulcerative colitis. It is also used to treat ankylosing spondylitis, psoriasis, and some forms of arthritis. This medicine may be used for other purposes; ask your health care provider or pharmacist if you have questions. What should I tell my health care provider before I take this medicine? They need to know if you have any of these conditions: -diabetes -exposure to tuberculosis -heart failure -hepatitis or liver disease -immune system problems -infection -lung or breathing disease, like COPD -multiple sclerosis -current or past resident of [name] or [place] -seizure disorder -an unusual or allergic reaction to infliximab, mouse proteins, other medicines, foods, dyes, or preservatives -pregnant or trying to get pregnant -breast-feeding How should I use this medicine? This medicine is for injection into a vein. It is usually given by a health care professional in a hospital or clinic setting. A special MedGuide will be given to you by the pharmacist with each prescription and refill. Be sure to read this information carefully each time. Talk to your pediatrician regarding the use of this medicine in children. Special care may be needed. Overdosage: If you think you have taken too much of this medicine contact a poison control center or emergency room at once. NOTE: This medicine is only for you. Do not share this medicine with others. What if I miss a dose? It is important not to miss your dose. Call your doctor or health care professional if you are unable to keep an appointment. What may interact with this medicine? Do not take this medicine with any of the following medications: -anakinra -rilonacept This medicine may also interact with the following medications: -vaccines This list may not describe all possible interactions. Give your health care provider  a list of all the medicines, herbs, non-prescription drugs, or dietary supplements you use. Also tell them if you smoke, drink alcohol, or use illegal drugs. Some items may interact with your medicine. What should I watch for while using this medicine? Visit your doctor or health care professional for regular checks on your progress. If you get a cold or other infection while receiving this medicine, call your doctor or health care professional. Do not treat yourself. This medicine may decrease your body's ability to fight infections. Before beginning therapy, your doctor may do a test to see if you have been exposed to tuberculosis. This medicine may make the symptoms of heart failure worse in some patients. If you notice symptoms such as increased shortness of breath or swelling of the ankles or legs, contact your health care provider right away. If you are going to have surgery or dental work, tell your health care professional or dentist that you have received this medicine. If you take this medicine for plaque psoriasis, stay out of the sun. If you cannot avoid being in the sun, wear protective clothing and use sunscreen. Do not use sun lamps or tanning beds/booths. What side effects may I notice from receiving this medicine? Side effects that you should report to your doctor or health care professional as soon as possible: -allergic reactions like skin rash, itching or hives, swelling of the face, lips, or tongue -chest pain -fever or chills, usually related to the infusion -muscle or joint pain -red, scaly patches or raised bumps on the skin -signs of infection - fever or chills, cough, sore throat, pain or difficulty passing urine -swollen lymph nodes in the neck, underarm,  or groin areas -unexplained weight loss -unusual bleeding or bruising -unusually weak or tired -yellowing of the eyes or skin Side effects that usually do not require medical attention (report to your doctor or health  care professional if they continue or are bothersome): -headache -heartburn or stomach pain -nausea, vomiting This list may not describe all possible side effects. Call your doctor for medical advice about side effects. You may report side effects to FDA at 1-800-FDA-1088. Where should I keep my medicine? This drug is given in a hospital or clinic and will not be stored at home. NOTE: This sheet is a summary. It may not cover all possible information. If you have questions about this medicine, talk to your doctor, pharmacist, or health care provider.  2012, [employer] Standard. (09/27/2007 10:26:02 AM)

## 2011-09-29 ENCOUNTER — Encounter (HOSPITAL_COMMUNITY): Admission: RE | Admit: 2011-09-29 | Payer: BC Managed Care – PPO | Source: Ambulatory Visit

## 2011-10-08 ENCOUNTER — Encounter (HOSPITAL_COMMUNITY): Admission: RE | Admit: 2011-10-08 | Payer: BC Managed Care – PPO | Source: Ambulatory Visit

## 2011-12-08 ENCOUNTER — Encounter (HOSPITAL_COMMUNITY)
Admission: RE | Admit: 2011-12-08 | Discharge: 2011-12-08 | Disposition: A | Discharge status: Home / Self Care | Payer: BC Managed Care – PPO | Source: Ambulatory Visit | Attending: Gastroenterology | Admitting: Gastroenterology

## 2011-12-08 ENCOUNTER — Encounter (HOSPITAL_COMMUNITY): Payer: Self-pay

## 2011-12-08 DIAGNOSIS — K5010 Crohn's disease of large intestine without complications: Secondary | ICD-10-CM | POA: Insufficient documentation

## 2011-12-08 MED ORDER — SODIUM CHLORIDE 0.9 % IV SOLN
400.0000 mg | Freq: Once | INTRAVENOUS | Status: AC
  Administered 2011-12-08: 400 mg via INTRAVENOUS
  Filled 2011-12-08: qty 40

## 2011-12-08 MED ORDER — METHYLPREDNISOLONE SODIUM SUCC 125 MG IJ SOLR
20.0000 mg | Freq: Once | INTRAMUSCULAR | Status: AC
  Administered 2011-12-08: 20 mg via INTRAVENOUS
  Filled 2011-12-08: qty 0.32

## 2011-12-08 MED ORDER — SODIUM CHLORIDE 0.9 % IV SOLN
Freq: Once | INTRAVENOUS | Status: AC
  Administered 2011-12-08: 14:00:00 via INTRAVENOUS

## 2011-12-08 MED ORDER — ACETAMINOPHEN 500 MG PO TABS
1000.0000 mg | ORAL_TABLET | Freq: Once | ORAL | Status: AC
  Administered 2011-12-08: 1000 mg via ORAL
  Filled 2011-12-08: qty 2

## 2012-02-02 ENCOUNTER — Encounter (HOSPITAL_COMMUNITY)
Admission: RE | Admit: 2012-02-02 | Payer: BC Managed Care – PPO | Source: Ambulatory Visit | Attending: Neurology | Admitting: Neurology

## 2012-02-29 ENCOUNTER — Other Ambulatory Visit (HOSPITAL_COMMUNITY): Payer: Self-pay | Admitting: Gastroenterology

## 2012-02-29 MED ORDER — LORATADINE 10 MG PO TABS: 10.0000 mg | ORAL_TABLET | Freq: Once | ORAL | Status: DC

## 2012-03-01 ENCOUNTER — Encounter (HOSPITAL_COMMUNITY)
Admission: RE | Admit: 2012-03-01 | Discharge: 2012-03-01 | Disposition: A | Discharge status: Home / Self Care | Payer: BC Managed Care – PPO | Source: Ambulatory Visit | Attending: Gastroenterology | Admitting: Gastroenterology

## 2012-03-01 ENCOUNTER — Encounter (HOSPITAL_COMMUNITY): Payer: Self-pay

## 2012-03-01 DIAGNOSIS — K5010 Crohn's disease of large intestine without complications: Secondary | ICD-10-CM | POA: Insufficient documentation

## 2012-03-01 MED ORDER — LORATADINE 10 MG PO TABS: 10.0000 mg | ORAL_TABLET | ORAL | Status: DC

## 2012-03-01 MED ORDER — ACETAMINOPHEN 500 MG PO TABS
1000.0000 mg | ORAL_TABLET | ORAL | Status: DC
  Administered 2012-03-01: 1000 mg via ORAL
  Filled 2012-03-01: qty 2

## 2012-03-01 MED ORDER — METHYLPREDNISOLONE SODIUM SUCC 125 MG IJ SOLR
20.0000 mg | INTRAMUSCULAR | Status: DC
  Administered 2012-03-01: 20 mg via INTRAVENOUS
  Filled 2012-03-01: qty 0.32

## 2012-03-01 MED ORDER — SODIUM CHLORIDE 0.9 % IV SOLN
400.0000 mg | INTRAVENOUS | Status: DC
  Administered 2012-03-01: 400 mg via INTRAVENOUS
  Filled 2012-03-01: qty 40

## 2012-03-01 MED ORDER — SODIUM CHLORIDE 0.9 % IV SOLN
INTRAVENOUS | Status: DC
Start: 2012-03-01 — End: 2012-03-02
  Administered 2012-03-01: 15:00:00 via INTRAVENOUS

## 2012-04-26 ENCOUNTER — Encounter (HOSPITAL_COMMUNITY): Payer: Self-pay

## 2012-04-26 ENCOUNTER — Encounter (HOSPITAL_COMMUNITY)
Admission: RE | Admit: 2012-04-26 | Discharge: 2012-04-26 | Disposition: A | Discharge status: Home / Self Care | Payer: BC Managed Care – PPO | Source: Ambulatory Visit | Attending: Gastroenterology | Admitting: Gastroenterology

## 2012-04-26 ENCOUNTER — Other Ambulatory Visit (HOSPITAL_COMMUNITY): Payer: Self-pay | Admitting: Gastroenterology

## 2012-04-26 DIAGNOSIS — K5010 Crohn's disease of large intestine without complications: Secondary | ICD-10-CM | POA: Insufficient documentation

## 2012-04-26 MED ORDER — SODIUM CHLORIDE 0.9 % IV SOLN
INTRAVENOUS | Status: DC
  Administered 2012-04-26: 15:00:00 via INTRAVENOUS

## 2012-04-26 MED ORDER — SODIUM CHLORIDE 0.9 % IV SOLN
400.0000 mg | INTRAVENOUS | Status: DC
  Administered 2012-04-26: 400 mg via INTRAVENOUS
  Filled 2012-04-26: qty 40

## 2012-04-26 MED ORDER — ACETAMINOPHEN 500 MG PO TABS
1000.0000 mg | ORAL_TABLET | Freq: Once | ORAL | Status: AC
  Administered 2012-04-26: 1000 mg via ORAL
  Filled 2012-04-26: qty 2

## 2012-04-26 MED ORDER — ACETAMINOPHEN 160 MG/5 ML PO SOLN
1000.0000 mg | ORAL | Status: DC
  Filled 2012-04-26: qty 31.2

## 2012-04-26 MED ORDER — METHYLPREDNISOLONE SODIUM SUCC 125 MG IJ SOLR
20.0000 mg | INTRAMUSCULAR | Status: DC
  Administered 2012-04-26: 20 mg via INTRAVENOUS
  Filled 2012-04-26: qty 0.32

## 2012-04-26 MED ORDER — SODIUM CHLORIDE 0.9 % IV SOLN
400.0000 mg | INTRAVENOUS | Status: DC
  Filled 2012-04-26: qty 40

## 2012-04-26 MED ORDER — METHYLPREDNISOLONE SODIUM SUCC 125 MG IJ SOLR
20.0000 mg | INTRAMUSCULAR | Status: DC
  Filled 2012-04-26: qty 0.32

## 2012-04-26 NOTE — Progress Notes (Signed)
Patient took his own Claritin at home prior to coming for his Remicade infusion today.

## 2012-06-21 ENCOUNTER — Other Ambulatory Visit (HOSPITAL_COMMUNITY): Payer: Self-pay | Admitting: Gastroenterology

## 2012-06-21 ENCOUNTER — Encounter (HOSPITAL_COMMUNITY)
Admission: RE | Admit: 2012-06-21 | Payer: BC Managed Care – PPO | Source: Ambulatory Visit | Attending: Gastroenterology | Admitting: Gastroenterology

## 2012-07-12 ENCOUNTER — Other Ambulatory Visit (HOSPITAL_COMMUNITY): Payer: Self-pay | Admitting: Gastroenterology

## 2012-07-12 ENCOUNTER — Inpatient Hospital Stay (HOSPITAL_COMMUNITY): Admission: RE | Admit: 2012-07-12 | Payer: BC Managed Care – PPO | Source: Ambulatory Visit

## 2012-07-20 ENCOUNTER — Encounter (HOSPITAL_COMMUNITY)
Admission: RE | Admit: 2012-07-20 | Discharge: 2012-07-20 | Disposition: A | Discharge status: Home / Self Care | Payer: BC Managed Care – PPO | Source: Ambulatory Visit | Attending: Gastroenterology | Admitting: Gastroenterology

## 2012-07-20 ENCOUNTER — Encounter (HOSPITAL_COMMUNITY): Payer: Self-pay

## 2012-07-20 DIAGNOSIS — K5010 Crohn's disease of large intestine without complications: Secondary | ICD-10-CM | POA: Insufficient documentation

## 2012-07-20 MED ORDER — SODIUM CHLORIDE 0.9 % IV SOLN
Freq: Once | INTRAVENOUS | Status: AC
Start: 2012-07-20 — End: 2012-07-20
  Administered 2012-07-20: 09:00:00 via INTRAVENOUS

## 2012-07-20 MED ORDER — SODIUM CHLORIDE 0.9 % IV SOLN
400.0000 mg | Freq: Once | INTRAVENOUS | Status: AC
  Administered 2012-07-20: 400 mg via INTRAVENOUS
  Filled 2012-07-20: qty 40

## 2012-07-20 MED ORDER — METHYLPREDNISOLONE SODIUM SUCC 40 MG IJ SOLR
20.0000 mg | Freq: Once | INTRAMUSCULAR | Status: AC
  Administered 2012-07-20: 20 mg via INTRAVENOUS
  Filled 2012-07-20: qty 1

## 2012-07-20 MED ORDER — LORATADINE 10 MG PO TABS: 10.0000 mg | ORAL_TABLET | Freq: Once | ORAL | Status: DC

## 2012-07-20 MED ORDER — ACETAMINOPHEN 500 MG PO TABS
1000.0000 mg | ORAL_TABLET | Freq: Once | ORAL | Status: AC
  Administered 2012-07-20: 1000 mg via ORAL
  Filled 2012-07-20: qty 2

## 2012-09-14 ENCOUNTER — Encounter (HOSPITAL_COMMUNITY): Payer: BC Managed Care – PPO

## 2012-09-14 ENCOUNTER — Encounter (HOSPITAL_COMMUNITY)
Admission: RE | Admit: 2012-09-14 | Discharge: 2012-09-14 | Disposition: A | Discharge status: Home / Self Care | Payer: BC Managed Care – PPO | Source: Ambulatory Visit | Attending: Gastroenterology | Admitting: Gastroenterology

## 2012-09-14 DIAGNOSIS — K5010 Crohn's disease of large intestine without complications: Secondary | ICD-10-CM | POA: Insufficient documentation

## 2012-09-25 ENCOUNTER — Encounter (HOSPITAL_COMMUNITY)
Admission: RE | Admit: 2012-09-25 | Discharge: 2012-09-25 | Disposition: A | Discharge status: Home / Self Care | Payer: BC Managed Care – PPO | Source: Ambulatory Visit | Attending: Gastroenterology | Admitting: Gastroenterology

## 2012-09-25 ENCOUNTER — Encounter (HOSPITAL_COMMUNITY): Payer: Self-pay

## 2012-09-25 DIAGNOSIS — K5010 Crohn's disease of large intestine without complications: Secondary | ICD-10-CM | POA: Insufficient documentation

## 2012-09-25 MED ORDER — SODIUM CHLORIDE 0.9 % IV SOLN
400.0000 mg | Freq: Once | INTRAVENOUS | Status: AC
  Administered 2012-09-25: 400 mg via INTRAVENOUS
  Filled 2012-09-25: qty 40

## 2012-09-25 MED ORDER — METHYLPREDNISOLONE SODIUM SUCC 125 MG IJ SOLR: 200.0000 mg | Freq: Once | INTRAMUSCULAR | Status: DC

## 2012-09-25 MED ORDER — ACETAMINOPHEN 500 MG PO TABS
1000.0000 mg | ORAL_TABLET | Freq: Once | ORAL | Status: AC
  Administered 2012-09-25: 1000 mg via ORAL
  Filled 2012-09-25: qty 2

## 2012-09-25 MED ORDER — LORATADINE 10 MG PO TABS: 10.0000 mg | ORAL_TABLET | Freq: Once | ORAL | Status: DC

## 2012-09-25 MED ORDER — SODIUM CHLORIDE 0.9 % IV SOLN
Freq: Once | INTRAVENOUS | Status: AC
  Administered 2012-09-25: 14:00:00 via INTRAVENOUS

## 2012-09-25 MED ORDER — METHYLPREDNISOLONE SODIUM SUCC 40 MG IJ SOLR: 40.0000 mg | Freq: Once | INTRAMUSCULAR | Status: DC

## 2012-09-25 MED ORDER — METHYLPREDNISOLONE SODIUM SUCC 40 MG IJ SOLR
20.0000 mg | Freq: Once | INTRAMUSCULAR | Status: AC
  Administered 2012-09-25: 20 mg via INTRAVENOUS
  Filled 2012-09-25: qty 1

## 2012-11-09 ENCOUNTER — Encounter (HOSPITAL_COMMUNITY): Payer: BC Managed Care – PPO

## 2012-11-20 ENCOUNTER — Encounter (HOSPITAL_COMMUNITY)
Admission: RE | Admit: 2012-11-20 | Discharge: 2012-11-20 | Disposition: A | Discharge status: Home / Self Care | Payer: BC Managed Care – PPO | Source: Ambulatory Visit | Attending: Gastroenterology | Admitting: Gastroenterology

## 2012-11-20 DIAGNOSIS — K5010 Crohn's disease of large intestine without complications: Secondary | ICD-10-CM | POA: Insufficient documentation

## 2012-12-04 ENCOUNTER — Encounter (HOSPITAL_COMMUNITY): Payer: Self-pay

## 2012-12-04 ENCOUNTER — Encounter (HOSPITAL_COMMUNITY)
Admission: RE | Admit: 2012-12-04 | Discharge: 2012-12-04 | Disposition: A | Discharge status: Home / Self Care | Payer: BC Managed Care – PPO | Source: Ambulatory Visit | Attending: Gastroenterology | Admitting: Gastroenterology

## 2012-12-04 DIAGNOSIS — K5010 Crohn's disease of large intestine without complications: Secondary | ICD-10-CM | POA: Insufficient documentation

## 2012-12-04 MED ORDER — ACETAMINOPHEN 500 MG PO TABS
1000.0000 mg | ORAL_TABLET | Freq: Once | ORAL | Status: AC
  Administered 2012-12-04: 1000 mg via ORAL
  Filled 2012-12-04: qty 2

## 2012-12-04 MED ORDER — METHYLPREDNISOLONE SODIUM SUCC 125 MG IJ SOLR
20.0000 mg | Freq: Once | INTRAMUSCULAR | Status: AC
  Administered 2012-12-04: 20 mg via INTRAVENOUS
  Filled 2012-12-04: qty 0.32

## 2012-12-04 MED ORDER — LORATADINE 10 MG PO TABS
10.0000 mg | ORAL_TABLET | Freq: Every day | ORAL | Status: DC
  Administered 2012-12-04: 10 mg via ORAL
  Filled 2012-12-04: qty 1

## 2012-12-04 MED ORDER — SODIUM CHLORIDE 0.9 % IV SOLN
INTRAVENOUS | Status: DC
  Administered 2012-12-04: 09:00:00 via INTRAVENOUS

## 2012-12-04 MED ORDER — SODIUM CHLORIDE 0.9 % IV SOLN
400.0000 mg | Freq: Once | INTRAVENOUS | Status: AC
  Administered 2012-12-04: 400 mg via INTRAVENOUS
  Filled 2012-12-04: qty 40

## 2013-01-29 ENCOUNTER — Encounter (HOSPITAL_COMMUNITY)
Admission: RE | Admit: 2013-01-29 | Discharge: 2013-01-29 | Disposition: A | Discharge status: Home / Self Care | Payer: BC Managed Care – PPO | Source: Ambulatory Visit | Attending: Gastroenterology | Admitting: Gastroenterology

## 2013-01-29 ENCOUNTER — Other Ambulatory Visit (HOSPITAL_COMMUNITY): Payer: Self-pay | Admitting: Gastroenterology

## 2013-01-29 DIAGNOSIS — K5010 Crohn's disease of large intestine without complications: Secondary | ICD-10-CM | POA: Insufficient documentation

## 2013-01-29 MED ORDER — SODIUM CHLORIDE 0.9 % IV SOLN
400.0000 mg | Freq: Once | INTRAVENOUS | Status: AC
  Administered 2013-01-29: 400 mg via INTRAVENOUS
  Filled 2013-01-29: qty 40

## 2013-01-29 MED ORDER — METHYLPREDNISOLONE SODIUM SUCC 125 MG IJ SOLR
20.0000 mg | Freq: Once | INTRAMUSCULAR | Status: AC
  Administered 2013-01-29: 20 mg via INTRAVENOUS
  Filled 2013-01-29 (×2): qty 0.32

## 2013-01-29 MED ORDER — LORATADINE 10 MG PO TABS: 10.0000 mg | ORAL_TABLET | Freq: Once | ORAL | Status: DC

## 2013-01-29 MED ORDER — ACETAMINOPHEN 500 MG PO TABS
1000.0000 mg | ORAL_TABLET | Freq: Once | ORAL | Status: AC
  Administered 2013-01-29: 1000 mg via ORAL
  Filled 2013-01-29: qty 2

## 2013-01-29 MED ORDER — SODIUM CHLORIDE 0.9 % IV SOLN
Freq: Once | INTRAVENOUS | Status: AC
  Administered 2013-01-29: 20 mL/h via INTRAVENOUS

## 2013-03-26 ENCOUNTER — Encounter (HOSPITAL_COMMUNITY)
Admission: RE | Admit: 2013-03-26 | Discharge: 2013-03-26 | Disposition: A | Discharge status: Home / Self Care | Payer: BC Managed Care – PPO | Source: Ambulatory Visit | Attending: Gastroenterology | Admitting: Gastroenterology

## 2013-03-26 ENCOUNTER — Other Ambulatory Visit (HOSPITAL_COMMUNITY): Payer: Self-pay | Admitting: Gastroenterology

## 2013-03-26 DIAGNOSIS — K5010 Crohn's disease of large intestine without complications: Secondary | ICD-10-CM | POA: Insufficient documentation

## 2013-03-26 MED ORDER — LORATADINE 10 MG PO TABS
10.0000 mg | ORAL_TABLET | Freq: Once | ORAL | Status: AC
  Administered 2013-03-26: 10 mg via ORAL
  Filled 2013-03-26: qty 1

## 2013-03-26 MED ORDER — ACETAMINOPHEN 500 MG PO TABS
1000.0000 mg | ORAL_TABLET | ORAL | Status: DC
  Administered 2013-03-26: 1000 mg via ORAL
  Filled 2013-03-26: qty 2

## 2013-03-26 MED ORDER — SODIUM CHLORIDE 0.9 % IV SOLN
Freq: Once | INTRAVENOUS | Status: AC
  Administered 2013-03-26: 09:00:00 via INTRAVENOUS

## 2013-03-26 MED ORDER — SODIUM CHLORIDE 0.9 % IV SOLN
400.0000 mg | Freq: Once | INTRAVENOUS | Status: AC
  Administered 2013-03-26: 400 mg via INTRAVENOUS
  Filled 2013-03-26: qty 40

## 2013-03-26 MED ORDER — METHYLPREDNISOLONE SODIUM SUCC 40 MG IJ SOLR
20.0000 mg | Freq: Once | INTRAMUSCULAR | Status: AC
  Administered 2013-03-26: 20 mg via INTRAVENOUS
  Filled 2013-03-26: qty 1

## 2013-03-26 NOTE — Discharge Instructions (Signed)
Infliximab injection What is this medicine? INFLIXIMAB (in [name]) is used to treat Crohn's disease and ulcerative colitis. It is also used to treat ankylosing spondylitis, psoriasis, and some forms of arthritis. This medicine may be used for other purposes; ask your health care provider or pharmacist if you have questions. COMMON BRAND NAME(S): Remicade What should I tell my health care provider before I take this medicine? They need to know if you have any of these conditions: -diabetes -exposure to tuberculosis -heart failure -hepatitis or liver disease -immune system problems -infection -lung or breathing disease, like COPD -multiple sclerosis -current or past resident of [name] or [name] -seizure disorder -an unusual or allergic reaction to infliximab, mouse proteins, other medicines, foods, dyes, or preservatives -pregnant or trying to get pregnant -breast-feeding How should I use this medicine? This medicine is for injection into a vein. It is usually given by a health care professional in a hospital or clinic setting. A special MedGuide will be given to you by the pharmacist with each prescription and refill. Be sure to read this information carefully each time. Talk to your pediatrician regarding the use of this medicine in children. Special care may be needed. Overdosage: If you think you have taken too much of this medicine contact a poison control center or emergency room at once. NOTE: This medicine is only for you. Do not share this medicine with others. What if I miss a dose? It is important not to miss your dose. Call your doctor or health care professional if you are unable to keep an appointment. What may interact with this medicine? Do not take this medicine with any of the following medications: -anakinra -rilonacept This medicine may also interact with the following medications: -vaccines This list may not describe all possible interactions.  Give your health care provider a list of all the medicines, herbs, non-prescription drugs, or dietary supplements you use. Also tell them if you smoke, drink alcohol, or use illegal drugs. Some items may interact with your medicine. What should I watch for while using this medicine? Visit your doctor or health care professional for regular checks on your progress. If you get a cold or other infection while receiving this medicine, call your doctor or health care professional. Do not treat yourself. This medicine may decrease your body's ability to fight infections. Before beginning therapy, your doctor may do a test to see if you have been exposed to tuberculosis. This medicine may make the symptoms of heart failure worse in some patients. If you notice symptoms such as increased shortness of breath or swelling of the ankles or legs, contact your health care provider right away. If you are going to have surgery or dental work, tell your health care professional or dentist that you have received this medicine. If you take this medicine for plaque psoriasis, stay out of the sun. If you cannot avoid being in the sun, wear protective clothing and use sunscreen. Do not use sun lamps or tanning beds/booths. What side effects may I notice from receiving this medicine? Side effects that you should report to your doctor or health care professional as soon as possible: -allergic reactions like skin rash, itching or hives, swelling of the face, lips, or tongue -chest pain -fever or chills, usually related to the infusion -muscle or joint pain -red, scaly patches or raised bumps on the skin -signs of infection - fever or chills, cough, sore throat, pain or difficulty passing urine -swollen lymph nodes  in the neck, underarm, or groin areas -unexplained weight loss -unusual bleeding or bruising -unusually weak or tired -yellowing of the eyes or skin Side effects that usually do not require medical attention  (report to your doctor or health care professional if they continue or are bothersome): -headache -heartburn or stomach pain -nausea, vomiting This list may not describe all possible side effects. Call your doctor for medical advice about side effects. You may report side effects to FDA at 1-800-FDA-1088. Where should I keep my medicine? This drug is given in a hospital or clinic and will not be stored at home. NOTE: This sheet is a summary. It may not cover all possible information. If you have questions about this medicine, talk to your doctor, pharmacist, or health care provider.  2014, Elsevier/[employer] Standard. (2007-09-27 10:26:02) Crohn's Disease Crohn's disease is a long-term (chronic) soreness and redness (inflammation) of the intestines (bowel). It can affect any portion of the digestive tract, from the mouth to the anus. It can also cause problems outside the digestive tract. Crohn's disease is closely related to a disease called ulcerative colitis (together, these two diseases are called inflammatory bowel disease).  CAUSES  The cause of Crohn's disease is not known. [name] is that, in an easily affected person, the immune system is triggered to attack the body's own digestive tissue. Crohn's disease runs in families. It seems to be more common in certain geographic areas and amongst certain races. There are no clear-cut dietary causes.  SYMPTOMS  Crohn's disease can cause many different symptoms since it can affect many different parts of the body. Symptoms include:  Fatigue.  Weight loss.  Chronic diarrhea, sometime bloody.  Abdominal pain and cramps.  Fever.  Ulcers or canker sores in the mouth or rectum.  Anemia (low red blood cells).  Arthritis, skin problems, and eye problems may occur. Complications of Crohn's disease can include:  Series of holes (perforation) of the bowel.  Portions of the intestines sticking to each other (adhesions).  Obstruction of the  bowel.  Fistula formation, typically in the rectal area but also in other areas. A fistula is an opening between the bowels and the outside, or between the bowels and another organ.  A painful crack in the mucous membrane of the anus (rectal fissure). DIAGNOSIS  Your caregiver may suspect Crohn's disease based on your symptoms and an exam. Blood tests may confirm that there is a problem. You may be asked to submit a stool specimen for examination. X-rays and CT scans may be necessary. Ultimately, the diagnosis is usually made after a procedure that uses a flexible tube that is inserted via your mouth or your anus. This is done under sedation and is called either an upper endoscopy or colonoscopy. With these tests, the specialist can take tiny tissue samples and remove them from the inside of the bowel (biopsy). Examination of this biopsy tissue under a microscope can reveal Crohn's disease as the cause of your symptoms. Due to the many different forms that Crohn's disease can take, symptoms may be present for several years before a diagnosis is made. TREATMENT  Medications are often used to decrease inflammation and control the immune system. These include medicines related to aspirin, steroid medications, and newer and stronger medications to slow down the immune system. Some medications may be used as suppositories or enemas. A number of other medications are used or have been studied. Your caregiver will make specific recommendations. HOME CARE INSTRUCTIONS   Symptoms such  as diarrhea can be controlled with medications. Avoid foods that have a laxative effect such as fresh fruit, vegetables and dairy products. During flare ups, you can rest your bowel by refraining from solid foods. Drink clear liquids frequently during the day (electrolyte or re-hydrating fluids are best. Your caregiver can help you with suggestions). Drink often to prevent loss of body fluids (dehydration). When diarrhea has  cleared, eat small meals and more frequently. Avoid food additives and stimulants such as caffeine (coffee, tea, or chocolate). Enzyme supplements may help if you develop intolerance to a sugar in dairy products (lactose). Ask your caregiver or dietitian about specific dietary instructions.  Try to maintain a positive attitude. Learn relaxation techniques such as self hypnosis, mental imaging, and muscle relaxation.  If possible, avoid stresses which can aggravate your condition.  Exercise regularly.  Follow your diet.  Always get plenty of rest. SEEK MEDICAL CARE IF:   Your symptoms fail to improve after a week or two of new treatment.  You experience continued weight loss.  You have ongoing cramps or loose bowels.  You develop a new skin rash, skin sores, or eye problems. SEEK IMMEDIATE MEDICAL CARE IF:   You have worsening of your symptoms or develop new symptoms.  You have a fever.  You develop bloody diarrhea.  You develop severe abdominal pain. MAKE SURE YOU:   Understand these instructions.  Will watch your condition.  Will get help right away if you are not doing well or get worse. Document Released: 11/18/2004 Document Revised: 06/05/2012 Document Reviewed: 10/17/2006 [facility] Patient Information 2014 [place], [place].

## 2013-05-21 ENCOUNTER — Encounter (HOSPITAL_COMMUNITY): Payer: Self-pay

## 2013-05-21 ENCOUNTER — Other Ambulatory Visit (HOSPITAL_COMMUNITY): Payer: Self-pay | Admitting: Gastroenterology

## 2013-05-21 ENCOUNTER — Encounter (HOSPITAL_COMMUNITY)
Admission: RE | Admit: 2013-05-21 | Discharge: 2013-05-21 | Disposition: A | Discharge status: Home / Self Care | Payer: BC Managed Care – PPO | Source: Ambulatory Visit | Attending: Gastroenterology | Admitting: Gastroenterology

## 2013-05-21 DIAGNOSIS — K5010 Crohn's disease of large intestine without complications: Secondary | ICD-10-CM | POA: Insufficient documentation

## 2013-05-21 MED ORDER — LORATADINE 10 MG PO TABS
10.0000 mg | ORAL_TABLET | Freq: Once | ORAL | Status: AC | PRN
  Administered 2013-05-21: 10 mg via ORAL
  Filled 2013-05-21: qty 1

## 2013-05-21 MED ORDER — ACETAMINOPHEN 500 MG PO TABS
1000.0000 mg | ORAL_TABLET | Freq: Once | ORAL | Status: AC
  Administered 2013-05-21: 1000 mg via ORAL
  Filled 2013-05-21: qty 2

## 2013-05-21 MED ORDER — METHYLPREDNISOLONE SODIUM SUCC 40 MG IJ SOLR
20.0000 mg | Freq: Once | INTRAMUSCULAR | Status: AC
  Administered 2013-05-21: 20 mg via INTRAVENOUS
  Filled 2013-05-21: qty 1
  Filled 2013-05-21: qty 0.32

## 2013-05-21 MED ORDER — SODIUM CHLORIDE 0.9 % IV SOLN
Freq: Once | INTRAVENOUS | Status: AC
  Administered 2013-05-21: 09:00:00 via INTRAVENOUS

## 2013-05-21 MED ORDER — SODIUM CHLORIDE 0.9 % IV SOLN
400.0000 mg | Freq: Once | INTRAVENOUS | Status: AC
  Administered 2013-05-21: 400 mg via INTRAVENOUS
  Filled 2013-05-21: qty 40

## 2013-05-21 NOTE — Discharge Instructions (Signed)
Infliximab injection What is this medicine? INFLIXIMAB (in [place] i mab) is used to treat Crohn's disease and ulcerative colitis. It is also used to treat ankylosing spondylitis, psoriasis, and some forms of arthritis. This medicine may be used for other purposes; ask your health care provider or pharmacist if you have questions. COMMON BRAND NAME(S): Remicade What should I tell my health care provider before I take this medicine? They need to know if you have any of these conditions: -diabetes -exposure to tuberculosis -heart failure -hepatitis or liver disease -immune system problems -infection -lung or breathing disease, like COPD -multiple sclerosis -current or past resident of [name] or [place] -seizure disorder -an unusual or allergic reaction to infliximab, mouse proteins, other medicines, foods, dyes, or preservatives -pregnant or trying to get pregnant -breast-feeding How should I use this medicine? This medicine is for injection into a vein. It is usually given by a health care professional in a hospital or clinic setting. A special MedGuide will be given to you by the pharmacist with each prescription and refill. Be sure to read this information carefully each time. Talk to your pediatrician regarding the use of this medicine in children. Special care may be needed. Overdosage: If you think you have taken too much of this medicine contact a poison control center or emergency room at once. NOTE: This medicine is only for you. Do not share this medicine with others. What if I miss a dose? It is important not to miss your dose. Call your doctor or health care professional if you are unable to keep an appointment. What may interact with this medicine? Do not take this medicine with any of the following medications: -anakinra -rilonacept This medicine may also interact with the following medications: -vaccines This list may not describe all possible interactions.  Give your health care provider a list of all the medicines, herbs, non-prescription drugs, or dietary supplements you use. Also tell them if you smoke, drink alcohol, or use illegal drugs. Some items may interact with your medicine. What should I watch for while using this medicine? Visit your doctor or health care professional for regular checks on your progress. If you get a cold or other infection while receiving this medicine, call your doctor or health care professional. Do not treat yourself. This medicine may decrease your body's ability to fight infections. Before beginning therapy, your doctor may do a test to see if you have been exposed to tuberculosis. This medicine may make the symptoms of heart failure worse in some patients. If you notice symptoms such as increased shortness of breath or swelling of the ankles or legs, contact your health care provider right away. If you are going to have surgery or dental work, tell your health care professional or dentist that you have received this medicine. If you take this medicine for plaque psoriasis, stay out of the sun. If you cannot avoid being in the sun, wear protective clothing and use sunscreen. Do not use sun lamps or tanning beds/booths. What side effects may I notice from receiving this medicine? Side effects that you should report to your doctor or health care professional as soon as possible: -allergic reactions like skin rash, itching or hives, swelling of the face, lips, or tongue -chest pain -fever or chills, usually related to the infusion -muscle or joint pain -red, scaly patches or raised bumps on the skin -signs of infection - fever or chills, cough, sore throat, pain or difficulty passing urine -swollen lymph nodes  in the neck, underarm, or groin areas -unexplained weight loss -unusual bleeding or bruising -unusually weak or tired -yellowing of the eyes or skin Side effects that usually do not require medical attention  (report to your doctor or health care professional if they continue or are bothersome): -headache -heartburn or stomach pain -nausea, vomiting This list may not describe all possible side effects. Call your doctor for medical advice about side effects. You may report side effects to FDA at 1-800-FDA-1088. Where should I keep my medicine? This drug is given in a hospital or clinic and will not be stored at home. NOTE: This sheet is a summary. It may not cover all possible information. If you have questions about this medicine, talk to your doctor, pharmacist, or health care provider.  2014, Elsevier/[employer] Standard. (2007-09-27 10:26:02)

## 2013-07-18 ENCOUNTER — Encounter (HOSPITAL_COMMUNITY)
Admission: RE | Admit: 2013-07-18 | Discharge: 2013-07-18 | Disposition: A | Discharge status: Home / Self Care | Payer: BC Managed Care – PPO | Source: Ambulatory Visit | Attending: Gastroenterology | Admitting: Gastroenterology

## 2013-07-18 ENCOUNTER — Encounter (HOSPITAL_COMMUNITY): Payer: Self-pay

## 2013-07-18 DIAGNOSIS — K5010 Crohn's disease of large intestine without complications: Secondary | ICD-10-CM | POA: Insufficient documentation

## 2013-07-18 MED ORDER — ACETAMINOPHEN 500 MG PO TABS
1000.0000 mg | ORAL_TABLET | Freq: Once | ORAL | Status: AC
  Administered 2013-07-18: 1000 mg via ORAL
  Filled 2013-07-18: qty 2

## 2013-07-18 MED ORDER — SODIUM CHLORIDE 0.9 % IV SOLN
400.0000 mg | Freq: Once | INTRAVENOUS | Status: AC
  Administered 2013-07-18: 400 mg via INTRAVENOUS
  Filled 2013-07-18: qty 40

## 2013-07-18 MED ORDER — LORATADINE 10 MG PO TABS
10.0000 mg | ORAL_TABLET | Freq: Once | ORAL | Status: AC
  Administered 2013-07-18: 10 mg via ORAL
  Filled 2013-07-18: qty 1

## 2013-07-18 MED ORDER — METHYLPREDNISOLONE SODIUM SUCC 40 MG IJ SOLR
20.0000 mg | Freq: Once | INTRAMUSCULAR | Status: AC
  Administered 2013-07-18: 10:00:00 via INTRAVENOUS
  Filled 2013-07-18: qty 1

## 2013-07-18 MED ORDER — SODIUM CHLORIDE 0.9 % IV SOLN
Freq: Once | INTRAVENOUS | Status: AC
  Administered 2013-07-18: 09:00:00 via INTRAVENOUS

## 2013-09-12 ENCOUNTER — Encounter (HOSPITAL_COMMUNITY): Payer: Self-pay

## 2013-09-12 ENCOUNTER — Encounter (HOSPITAL_COMMUNITY)
Admission: RE | Admit: 2013-09-12 | Discharge: 2013-09-12 | Disposition: A | Discharge status: Home / Self Care | Payer: BC Managed Care – PPO | Source: Ambulatory Visit | Attending: Gastroenterology | Admitting: Gastroenterology

## 2013-09-12 DIAGNOSIS — K5010 Crohn's disease of large intestine without complications: Secondary | ICD-10-CM | POA: Insufficient documentation

## 2013-09-12 MED ORDER — METHYLPREDNISOLONE SODIUM SUCC 40 MG IJ SOLR
20.0000 mg | INTRAMUSCULAR | Status: DC
  Administered 2013-09-12: 20 mg via INTRAVENOUS
  Filled 2013-09-12: qty 1

## 2013-09-12 MED ORDER — SODIUM CHLORIDE 0.9 % IV SOLN
INTRAVENOUS | Status: AC
  Administered 2013-09-12: 10:00:00 via INTRAVENOUS

## 2013-09-12 MED ORDER — SODIUM CHLORIDE 0.9 % IV SOLN
400.0000 mg | INTRAVENOUS | Status: AC
  Administered 2013-09-12: 400 mg via INTRAVENOUS
  Filled 2013-09-12: qty 40

## 2013-09-12 NOTE — Progress Notes (Signed)
Patient took Claritin 10 mg and Tylenol 1,000 mg at home prior to infusion.

## 2013-09-12 NOTE — Discharge Instructions (Signed)
Infliximab injection What is this medicine? INFLIXIMAB (in [place] i mab) is used to treat Crohn's disease and ulcerative colitis. It is also used to treat ankylosing spondylitis, psoriasis, and some forms of arthritis. This medicine may be used for other purposes; ask your health care provider or pharmacist if you have questions. COMMON BRAND NAME(S): Remicade What should I tell my health care provider before I take this medicine? They need to know if you have any of these conditions: -diabetes -exposure to tuberculosis -heart failure -hepatitis or liver disease -immune system problems -infection -lung or breathing disease, like COPD -multiple sclerosis -current or past resident of [name] or [place] -seizure disorder -an unusual or allergic reaction to infliximab, mouse proteins, other medicines, foods, dyes, or preservatives -pregnant or trying to get pregnant -breast-feeding How should I use this medicine? This medicine is for injection into a vein. It is usually given by a health care professional in a hospital or clinic setting. A special MedGuide will be given to you by the pharmacist with each prescription and refill. Be sure to read this information carefully each time. Talk to your pediatrician regarding the use of this medicine in children. Special care may be needed. Overdosage: If you think you have taken too much of this medicine contact a poison control center or emergency room at once. NOTE: This medicine is only for you. Do not share this medicine with others. What if I miss a dose? It is important not to miss your dose. Call your doctor or health care professional if you are unable to keep an appointment. What may interact with this medicine? Do not take this medicine with any of the following medications: -anakinra -rilonacept This medicine may also interact with the following medications: -vaccines This list may not describe all possible interactions.  Give your health care provider a list of all the medicines, herbs, non-prescription drugs, or dietary supplements you use. Also tell them if you smoke, drink alcohol, or use illegal drugs. Some items may interact with your medicine. What should I watch for while using this medicine? Visit your doctor or health care professional for regular checks on your progress. If you get a cold or other infection while receiving this medicine, call your doctor or health care professional. Do not treat yourself. This medicine may decrease your body's ability to fight infections. Before beginning therapy, your doctor may do a test to see if you have been exposed to tuberculosis. This medicine may make the symptoms of heart failure worse in some patients. If you notice symptoms such as increased shortness of breath or swelling of the ankles or legs, contact your health care provider right away. If you are going to have surgery or dental work, tell your health care professional or dentist that you have received this medicine. If you take this medicine for plaque psoriasis, stay out of the sun. If you cannot avoid being in the sun, wear protective clothing and use sunscreen. Do not use sun lamps or tanning beds/booths. What side effects may I notice from receiving this medicine? Side effects that you should report to your doctor or health care professional as soon as possible: -allergic reactions like skin rash, itching or hives, swelling of the face, lips, or tongue -chest pain -fever or chills, usually related to the infusion -muscle or joint pain -red, scaly patches or raised bumps on the skin -signs of infection - fever or chills, cough, sore throat, pain or difficulty passing urine -swollen lymph nodes  in the neck, underarm, or groin areas -unexplained weight loss -unusual bleeding or bruising -unusually weak or tired -yellowing of the eyes or skin Side effects that usually do not require medical attention  (report to your doctor or health care professional if they continue or are bothersome): -headache -heartburn or stomach pain -nausea, vomiting This list may not describe all possible side effects. Call your doctor for medical advice about side effects. You may report side effects to FDA at 1-800-FDA-1088. Where should I keep my medicine? This drug is given in a hospital or clinic and will not be stored at home. NOTE: This sheet is a summary. It may not cover all possible information. If you have questions about this medicine, talk to your doctor, pharmacist, or health care provider.  2015, Elsevier/[employer] Standard. (2007-09-27 10:26:02)

## 2013-11-07 ENCOUNTER — Encounter (HOSPITAL_COMMUNITY): Payer: Self-pay

## 2013-11-07 ENCOUNTER — Encounter (HOSPITAL_COMMUNITY)
Admission: RE | Admit: 2013-11-07 | Discharge: 2013-11-07 | Disposition: A | Discharge status: Home / Self Care | Payer: BC Managed Care – PPO | Source: Ambulatory Visit | Attending: Gastroenterology | Admitting: Gastroenterology

## 2013-11-07 DIAGNOSIS — K5010 Crohn's disease of large intestine without complications: Secondary | ICD-10-CM | POA: Diagnosis present

## 2013-11-07 MED ORDER — SODIUM CHLORIDE 0.9 % IV SOLN
400.0000 mg | INTRAVENOUS | Status: DC
  Administered 2013-11-07: 400 mg via INTRAVENOUS
  Filled 2013-11-07: qty 40

## 2013-11-07 MED ORDER — LORATADINE 10 MG PO TABS: 10.0000 mg | ORAL_TABLET | ORAL | Status: DC

## 2013-11-07 MED ORDER — METHYLPREDNISOLONE SODIUM SUCC 125 MG IJ SOLR
20.0000 mg | INTRAMUSCULAR | Status: DC
  Administered 2013-11-07: 20 mg via INTRAVENOUS
  Filled 2013-11-07: qty 2

## 2013-11-07 MED ORDER — ACETAMINOPHEN 500 MG PO TABS
1000.0000 mg | ORAL_TABLET | ORAL | Status: DC
  Administered 2013-11-07: 1000 mg via ORAL
  Filled 2013-11-07: qty 2

## 2013-11-07 MED ORDER — SODIUM CHLORIDE 0.9 % IV SOLN
250.0000 mL | INTRAVENOUS | Status: DC
  Administered 2013-11-07: 250 mL via INTRAVENOUS

## 2013-11-07 NOTE — Discharge Instructions (Signed)
Infliximab injection What is this medicine? INFLIXIMAB (in [place] i mab) is used to treat Crohn's disease and ulcerative colitis. It is also used to treat ankylosing spondylitis, psoriasis, and some forms of arthritis. This medicine may be used for other purposes; ask your health care provider or pharmacist if you have questions. COMMON BRAND NAME(S): Remicade What should I tell my health care provider before I take this medicine? They need to know if you have any of these conditions: -diabetes -exposure to tuberculosis -heart failure -hepatitis or liver disease -immune system problems -infection -lung or breathing disease, like COPD -multiple sclerosis -current or past resident of [name] or [place] -seizure disorder -an unusual or allergic reaction to infliximab, mouse proteins, other medicines, foods, dyes, or preservatives -pregnant or trying to get pregnant -breast-feeding How should I use this medicine? This medicine is for injection into a vein. It is usually given by a health care professional in a hospital or clinic setting. A special MedGuide will be given to you by the pharmacist with each prescription and refill. Be sure to read this information carefully each time. Talk to your pediatrician regarding the use of this medicine in children. Special care may be needed. Overdosage: If you think you have taken too much of this medicine contact a poison control center or emergency room at once. NOTE: This medicine is only for you. Do not share this medicine with others. What if I miss a dose? It is important not to miss your dose. Call your doctor or health care professional if you are unable to keep an appointment. What may interact with this medicine? Do not take this medicine with any of the following medications: -anakinra -rilonacept This medicine may also interact with the following medications: -vaccines This list may not describe all possible interactions.  Give your health care provider a list of all the medicines, herbs, non-prescription drugs, or dietary supplements you use. Also tell them if you smoke, drink alcohol, or use illegal drugs. Some items may interact with your medicine. What should I watch for while using this medicine? Visit your doctor or health care professional for regular checks on your progress. If you get a cold or other infection while receiving this medicine, call your doctor or health care professional. Do not treat yourself. This medicine may decrease your body's ability to fight infections. Before beginning therapy, your doctor may do a test to see if you have been exposed to tuberculosis. This medicine may make the symptoms of heart failure worse in some patients. If you notice symptoms such as increased shortness of breath or swelling of the ankles or legs, contact your health care provider right away. If you are going to have surgery or dental work, tell your health care professional or dentist that you have received this medicine. If you take this medicine for plaque psoriasis, stay out of the sun. If you cannot avoid being in the sun, wear protective clothing and use sunscreen. Do not use sun lamps or tanning beds/booths. What side effects may I notice from receiving this medicine? Side effects that you should report to your doctor or health care professional as soon as possible: -allergic reactions like skin rash, itching or hives, swelling of the face, lips, or tongue -chest pain -fever or chills, usually related to the infusion -muscle or joint pain -red, scaly patches or raised bumps on the skin -signs of infection - fever or chills, cough, sore throat, pain or difficulty passing urine -swollen lymph nodes  in the neck, underarm, or groin areas -unexplained weight loss -unusual bleeding or bruising -unusually weak or tired -yellowing of the eyes or skin Side effects that usually do not require medical attention  (report to your doctor or health care professional if they continue or are bothersome): -headache -heartburn or stomach pain -nausea, vomiting This list may not describe all possible side effects. Call your doctor for medical advice about side effects. You may report side effects to FDA at 1-800-FDA-1088. Where should I keep my medicine? This drug is given in a hospital or clinic and will not be stored at home. NOTE: This sheet is a summary. It may not cover all possible information. If you have questions about this medicine, talk to your doctor, pharmacist, or health care provider.  2015, [employer]vier/[employer] Standard. (2007-09-27 10:26:02)

## 2013-12-27 ENCOUNTER — Other Ambulatory Visit: Payer: Self-pay | Admitting: Gastroenterology

## 2014-01-02 ENCOUNTER — Encounter (HOSPITAL_COMMUNITY): Payer: Self-pay

## 2014-01-02 ENCOUNTER — Encounter (HOSPITAL_COMMUNITY)
Admission: RE | Admit: 2014-01-02 | Discharge: 2014-01-02 | Disposition: A | Discharge status: Home / Self Care | Payer: BC Managed Care – PPO | Source: Ambulatory Visit | Attending: Gastroenterology | Admitting: Gastroenterology

## 2014-01-02 DIAGNOSIS — K5010 Crohn's disease of large intestine without complications: Secondary | ICD-10-CM | POA: Diagnosis present

## 2014-01-02 MED ORDER — SODIUM CHLORIDE 0.9 % IV SOLN
250.0000 mL | INTRAVENOUS | Status: AC
  Administered 2014-01-02: 250 mL via INTRAVENOUS

## 2014-01-02 MED ORDER — SODIUM CHLORIDE 0.9 % IV SOLN
400.0000 mg | INTRAVENOUS | Status: AC
  Administered 2014-01-02: 400 mg via INTRAVENOUS
  Filled 2014-01-02: qty 40

## 2014-01-02 MED ORDER — LORATADINE 10 MG PO TABS: 10.0000 mg | ORAL_TABLET | ORAL | Status: DC

## 2014-01-02 MED ORDER — METHYLPREDNISOLONE SODIUM SUCC 125 MG IJ SOLR
20.0000 mg | INTRAMUSCULAR | Status: AC
  Administered 2014-01-02: 20 mg via INTRAVENOUS
  Filled 2014-01-02: qty 2

## 2014-01-02 MED ORDER — ACETAMINOPHEN 500 MG PO TABS
1000.0000 mg | ORAL_TABLET | ORAL | Status: AC
  Administered 2014-01-02: 1000 mg via ORAL
  Filled 2014-01-02: qty 2

## 2014-01-02 NOTE — Discharge Instructions (Signed)
Infliximab injection What is this medicine? INFLIXIMAB (in [place] i mab) is used to treat Crohn's disease and ulcerative colitis. It is also used to treat ankylosing spondylitis, psoriasis, and some forms of arthritis. This medicine may be used for other purposes; ask your health care provider or pharmacist if you have questions. COMMON BRAND NAME(S): Remicade What should I tell my health care provider before I take this medicine? They need to know if you have any of these conditions: -diabetes -exposure to tuberculosis -heart failure -hepatitis or liver disease -immune system problems -infection -lung or breathing disease, like COPD -multiple sclerosis -current or past resident of [name] or [place] -seizure disorder -an unusual or allergic reaction to infliximab, mouse proteins, other medicines, foods, dyes, or preservatives -pregnant or trying to get pregnant -breast-feeding How should I use this medicine? This medicine is for injection into a vein. It is usually given by a health care professional in a hospital or clinic setting. A special MedGuide will be given to you by the pharmacist with each prescription and refill. Be sure to read this information carefully each time. Talk to your pediatrician regarding the use of this medicine in children. Special care may be needed. Overdosage: If you think you have taken too much of this medicine contact a poison control center or emergency room at once. NOTE: This medicine is only for you. Do not share this medicine with others. What if I miss a dose? It is important not to miss your dose. Call your doctor or health care professional if you are unable to keep an appointment. What may interact with this medicine? Do not take this medicine with any of the following medications: -anakinra -rilonacept This medicine may also interact with the following medications: -vaccines This list may not describe all possible interactions.  Give your health care provider a list of all the medicines, herbs, non-prescription drugs, or dietary supplements you use. Also tell them if you smoke, drink alcohol, or use illegal drugs. Some items may interact with your medicine. What should I watch for while using this medicine? Visit your doctor or health care professional for regular checks on your progress. If you get a cold or other infection while receiving this medicine, call your doctor or health care professional. Do not treat yourself. This medicine may decrease your body's ability to fight infections. Before beginning therapy, your doctor may do a test to see if you have been exposed to tuberculosis. This medicine may make the symptoms of heart failure worse in some patients. If you notice symptoms such as increased shortness of breath or swelling of the ankles or legs, contact your health care provider right away. If you are going to have surgery or dental work, tell your health care professional or dentist that you have received this medicine. If you take this medicine for plaque psoriasis, stay out of the sun. If you cannot avoid being in the sun, wear protective clothing and use sunscreen. Do not use sun lamps or tanning beds/booths. What side effects may I notice from receiving this medicine? Side effects that you should report to your doctor or health care professional as soon as possible: -allergic reactions like skin rash, itching or hives, swelling of the face, lips, or tongue -chest pain -fever or chills, usually related to the infusion -muscle or joint pain -red, scaly patches or raised bumps on the skin -signs of infection - fever or chills, cough, sore throat, pain or difficulty passing urine -swollen lymph nodes  in the neck, underarm, or groin areas -unexplained weight loss -unusual bleeding or bruising -unusually weak or tired -yellowing of the eyes or skin Side effects that usually do not require medical attention  (report to your doctor or health care professional if they continue or are bothersome): -headache -heartburn or stomach pain -nausea, vomiting This list may not describe all possible side effects. Call your doctor for medical advice about side effects. You may report side effects to FDA at 1-800-FDA-1088. Where should I keep my medicine? This drug is given in a hospital or clinic and will not be stored at home. NOTE: This sheet is a summary. It may not cover all possible information. If you have questions about this medicine, talk to your doctor, pharmacist, or health care provider.  2015, [employer]. (2007-09-27 10:26:02)

## 2014-02-28 ENCOUNTER — Encounter (HOSPITAL_COMMUNITY): Payer: Self-pay

## 2014-02-28 ENCOUNTER — Encounter (HOSPITAL_COMMUNITY)
Admission: RE | Admit: 2014-02-28 | Discharge: 2014-02-28 | Disposition: A | Discharge status: Home / Self Care | Payer: BLUE CROSS/BLUE SHIELD | Source: Ambulatory Visit | Attending: Gastroenterology | Admitting: Gastroenterology

## 2014-02-28 ENCOUNTER — Other Ambulatory Visit (HOSPITAL_COMMUNITY): Payer: Self-pay | Admitting: Gastroenterology

## 2014-02-28 DIAGNOSIS — K5010 Crohn's disease of large intestine without complications: Secondary | ICD-10-CM | POA: Diagnosis present

## 2014-02-28 MED ORDER — METHYLPREDNISOLONE SODIUM SUCC 40 MG IJ SOLR
20.0000 mg | Freq: Once | INTRAMUSCULAR | Status: AC
  Administered 2014-02-28: 20 mg via INTRAVENOUS
  Filled 2014-02-28: qty 1

## 2014-02-28 MED ORDER — ACETAMINOPHEN 500 MG PO TABS
1000.0000 mg | ORAL_TABLET | Freq: Once | ORAL | Status: AC
  Administered 2014-02-28: 1000 mg via ORAL
  Filled 2014-02-28: qty 2

## 2014-02-28 MED ORDER — LORATADINE 10 MG PO TABS: 10.0000 mg | ORAL_TABLET | Freq: Once | ORAL | Status: DC

## 2014-02-28 MED ORDER — SODIUM CHLORIDE 0.9 % IV SOLN
Freq: Once | INTRAVENOUS | Status: AC
  Administered 2014-02-28: 250 mL via INTRAVENOUS

## 2014-02-28 MED ORDER — SODIUM CHLORIDE 0.9 % IV SOLN
400.0000 mg | Freq: Once | INTRAVENOUS | Status: AC
  Administered 2014-02-28: 400 mg via INTRAVENOUS
  Filled 2014-02-28: qty 40

## 2014-04-19 ENCOUNTER — Other Ambulatory Visit: Payer: Self-pay | Admitting: Gastroenterology

## 2014-04-23 MED ORDER — METHYLPREDNISOLONE SODIUM SUCC 125 MG IJ SOLR: 20.0000 mg | INTRAMUSCULAR | Status: DC

## 2014-04-23 MED ORDER — SODIUM CHLORIDE 0.9 % IV SOLN: 400.0000 mg | INTRAVENOUS | Status: DC

## 2014-04-23 MED ORDER — LORATADINE 10 MG PO TABS: 10.0000 mg | ORAL_TABLET | Freq: Every day | ORAL | Status: AC

## 2014-04-23 MED ORDER — ACETAMINOPHEN 325 MG PO TABS: 1000.0000 mg | ORAL_TABLET | ORAL | Status: DC

## 2014-04-25 ENCOUNTER — Encounter (HOSPITAL_COMMUNITY)
Admission: RE | Admit: 2014-04-25 | Discharge: 2014-04-25 | Disposition: A | Discharge status: Home / Self Care | Payer: BLUE CROSS/BLUE SHIELD | Source: Ambulatory Visit | Attending: Gastroenterology | Admitting: Gastroenterology

## 2014-04-25 ENCOUNTER — Encounter (HOSPITAL_COMMUNITY): Payer: Self-pay

## 2014-04-25 DIAGNOSIS — K5010 Crohn's disease of large intestine without complications: Secondary | ICD-10-CM | POA: Diagnosis not present

## 2014-04-25 MED ORDER — ACETAMINOPHEN 500 MG PO TABS: 1000.0000 mg | ORAL_TABLET | ORAL | Status: DC

## 2014-04-25 MED ORDER — SODIUM CHLORIDE 0.9 % IV SOLN
INTRAVENOUS | Status: DC
  Administered 2014-04-25: 09:00:00 via INTRAVENOUS

## 2014-04-25 MED ORDER — SODIUM CHLORIDE 0.9 % IV SOLN
400.0000 mg | INTRAVENOUS | Status: DC
  Administered 2014-04-25: 400 mg via INTRAVENOUS
  Filled 2014-04-25: qty 40

## 2014-04-25 MED ORDER — METHYLPREDNISOLONE SODIUM SUCC 40 MG IJ SOLR
20.0000 mg | INTRAMUSCULAR | Status: DC
  Administered 2014-04-25: 20 mg via INTRAVENOUS
  Filled 2014-04-25: qty 0.32
  Filled 2014-04-25: qty 1

## 2014-04-25 MED ORDER — LORATADINE 10 MG PO TABS: 10.0000 mg | ORAL_TABLET | ORAL | Status: DC

## 2014-06-24 ENCOUNTER — Encounter (HOSPITAL_COMMUNITY)
Admission: RE | Admit: 2014-06-24 | Discharge: 2014-06-24 | Disposition: A | Discharge status: Home / Self Care | Payer: BLUE CROSS/BLUE SHIELD | Source: Ambulatory Visit | Attending: Gastroenterology | Admitting: Gastroenterology

## 2014-06-24 ENCOUNTER — Encounter (HOSPITAL_COMMUNITY): Payer: Self-pay

## 2014-06-24 DIAGNOSIS — K5010 Crohn's disease of large intestine without complications: Secondary | ICD-10-CM | POA: Diagnosis not present

## 2014-06-24 MED ORDER — SODIUM CHLORIDE 0.9 % IV SOLN
400.0000 mg | INTRAVENOUS | Status: AC
  Administered 2014-06-24: 400 mg via INTRAVENOUS
  Filled 2014-06-24: qty 40

## 2014-06-24 MED ORDER — METHYLPREDNISOLONE SODIUM SUCC 40 MG IJ SOLR
20.0000 mg | INTRAMUSCULAR | Status: AC
  Administered 2014-06-24: 20 mg via INTRAVENOUS
  Filled 2014-06-24: qty 1

## 2014-06-24 MED ORDER — SODIUM CHLORIDE 0.9 % IV SOLN
INTRAVENOUS | Status: AC
  Administered 2014-06-24: 09:00:00 via INTRAVENOUS

## 2014-06-24 NOTE — Progress Notes (Signed)
States he took tylenol and claritin at home prior to arrival

## 2014-08-19 ENCOUNTER — Other Ambulatory Visit (HOSPITAL_COMMUNITY): Payer: Self-pay | Admitting: Gastroenterology

## 2014-08-19 ENCOUNTER — Encounter (HOSPITAL_COMMUNITY): Payer: Self-pay

## 2014-08-19 ENCOUNTER — Encounter (HOSPITAL_COMMUNITY)
Admission: RE | Admit: 2014-08-19 | Discharge: 2014-08-19 | Disposition: A | Discharge status: Home / Self Care | Payer: BLUE CROSS/BLUE SHIELD | Source: Ambulatory Visit | Attending: Gastroenterology | Admitting: Gastroenterology

## 2014-08-19 DIAGNOSIS — K5010 Crohn's disease of large intestine without complications: Secondary | ICD-10-CM | POA: Diagnosis not present

## 2014-08-19 MED ORDER — METHYLPREDNISOLONE SODIUM SUCC 40 MG IJ SOLR
20.0000 mg | INTRAMUSCULAR | Status: DC
  Administered 2014-08-19: 20 mg via INTRAVENOUS
  Filled 2014-08-19: qty 1

## 2014-08-19 MED ORDER — SODIUM CHLORIDE 0.9 % IV SOLN
INTRAVENOUS | Status: DC
  Administered 2014-08-19: 250 mL via INTRAVENOUS

## 2014-08-19 MED ORDER — LORATADINE 10 MG PO TABS: 10.0000 mg | ORAL_TABLET | ORAL | Status: DC

## 2014-08-19 MED ORDER — ACETAMINOPHEN 500 MG PO TABS: 1000.0000 mg | ORAL_TABLET | ORAL | Status: DC

## 2014-08-19 MED ORDER — INFLIXIMAB 100 MG IV SOLR
400.0000 mg | INTRAVENOUS | Status: DC
  Administered 2014-08-19: 400 mg via INTRAVENOUS
  Filled 2014-08-19: qty 40

## 2014-10-14 ENCOUNTER — Encounter (HOSPITAL_COMMUNITY): Payer: BLUE CROSS/BLUE SHIELD

## 2014-10-21 ENCOUNTER — Encounter (INDEPENDENT_AMBULATORY_CARE_PROVIDER_SITE_OTHER): Payer: Self-pay

## 2014-10-21 ENCOUNTER — Encounter (HOSPITAL_COMMUNITY)
Admission: RE | Admit: 2014-10-21 | Discharge: 2014-10-21 | Disposition: A | Discharge status: Home / Self Care | Payer: BLUE CROSS/BLUE SHIELD | Source: Ambulatory Visit | Attending: Gastroenterology | Admitting: Gastroenterology

## 2014-10-21 DIAGNOSIS — K5010 Crohn's disease of large intestine without complications: Secondary | ICD-10-CM | POA: Diagnosis present

## 2014-10-21 MED ORDER — SODIUM CHLORIDE 0.9 % IV SOLN
INTRAVENOUS | Status: AC
  Administered 2014-10-21: 10:00:00 via INTRAVENOUS

## 2014-10-21 MED ORDER — SODIUM CHLORIDE 0.9 % IV SOLN
400.0000 mg | INTRAVENOUS | Status: AC
  Administered 2014-10-21: 400 mg via INTRAVENOUS
  Filled 2014-10-21: qty 40

## 2014-10-21 MED ORDER — METHYLPREDNISOLONE SODIUM SUCC 40 MG IJ SOLR
20.0000 mg | INTRAMUSCULAR | Status: DC
  Administered 2014-10-21: 20 mg via INTRAVENOUS
  Filled 2014-10-21: qty 1

## 2014-10-21 NOTE — Progress Notes (Signed)
Patient was seen 12 days ago by urgent care. Was dx w bronchitis. Finished amoxicillin 1 week ago. Has been afebrile and feels good today. Call to office, spoke w Katrina. Okay to proceed w remicade today

## 2014-12-16 ENCOUNTER — Encounter (HOSPITAL_COMMUNITY): Payer: Self-pay

## 2014-12-16 ENCOUNTER — Encounter (HOSPITAL_COMMUNITY)
Admission: RE | Admit: 2014-12-16 | Discharge: 2014-12-16 | Disposition: A | Discharge status: Home / Self Care | Payer: BLUE CROSS/BLUE SHIELD | Source: Ambulatory Visit | Attending: Gastroenterology | Admitting: Gastroenterology

## 2014-12-16 ENCOUNTER — Encounter (INDEPENDENT_AMBULATORY_CARE_PROVIDER_SITE_OTHER): Payer: Self-pay

## 2014-12-16 DIAGNOSIS — K5010 Crohn's disease of large intestine without complications: Secondary | ICD-10-CM | POA: Insufficient documentation

## 2014-12-16 MED ORDER — LORATADINE 10 MG PO TABS: 10.0000 mg | ORAL_TABLET | ORAL | Status: DC

## 2014-12-16 MED ORDER — SODIUM CHLORIDE 0.9 % IV SOLN
400.0000 mg | INTRAVENOUS | Status: DC
  Administered 2014-12-16: 400 mg via INTRAVENOUS
  Filled 2014-12-16: qty 40

## 2014-12-16 MED ORDER — ACETAMINOPHEN 500 MG PO TABS: 1000.0000 mg | ORAL_TABLET | ORAL | Status: DC

## 2014-12-16 MED ORDER — METHYLPREDNISOLONE SODIUM SUCC 125 MG IJ SOLR
20.0000 mg | INTRAMUSCULAR | Status: DC
  Administered 2014-12-16: 20 mg via INTRAVENOUS
  Filled 2014-12-16: qty 0.32

## 2014-12-16 MED ORDER — SODIUM CHLORIDE 0.9 % IV SOLN
INTRAVENOUS | Status: DC
  Administered 2014-12-16: 10:00:00 via INTRAVENOUS

## 2015-02-10 ENCOUNTER — Encounter (HOSPITAL_COMMUNITY): Payer: Self-pay

## 2015-02-10 ENCOUNTER — Encounter (HOSPITAL_COMMUNITY)
Admission: RE | Admit: 2015-02-10 | Discharge: 2015-02-10 | Disposition: A | Discharge status: Home / Self Care | Payer: BLUE CROSS/BLUE SHIELD | Source: Ambulatory Visit | Attending: Gastroenterology | Admitting: Gastroenterology

## 2015-02-10 DIAGNOSIS — K5010 Crohn's disease of large intestine without complications: Secondary | ICD-10-CM | POA: Insufficient documentation

## 2015-02-10 MED ORDER — SODIUM CHLORIDE 0.9 % IV SOLN
INTRAVENOUS | Status: AC
  Administered 2015-02-10: 10:00:00 via INTRAVENOUS

## 2015-02-10 MED ORDER — SODIUM CHLORIDE 0.9 % IV SOLN
400.0000 mg | INTRAVENOUS | Status: AC
  Administered 2015-02-10: 400 mg via INTRAVENOUS
  Filled 2015-02-10: qty 40

## 2015-02-10 MED ORDER — ACETAMINOPHEN 500 MG PO TABS: 1000.0000 mg | ORAL_TABLET | ORAL | Status: DC

## 2015-02-10 MED ORDER — METHYLPREDNISOLONE SODIUM SUCC 40 MG IJ SOLR
20.0000 mg | INTRAMUSCULAR | Status: DC
  Filled 2015-02-10: qty 0.32

## 2015-02-10 MED ORDER — LORATADINE 10 MG PO TABS: 10.0000 mg | ORAL_TABLET | ORAL | Status: DC

## 2015-04-07 ENCOUNTER — Encounter (HOSPITAL_COMMUNITY): Payer: BLUE CROSS/BLUE SHIELD

## 2015-04-14 ENCOUNTER — Encounter (HOSPITAL_COMMUNITY)
Admission: RE | Admit: 2015-04-14 | Discharge: 2015-04-14 | Disposition: A | Discharge status: Home / Self Care | Payer: BLUE CROSS/BLUE SHIELD | Source: Ambulatory Visit | Attending: Gastroenterology | Admitting: Gastroenterology

## 2015-04-14 ENCOUNTER — Encounter (HOSPITAL_COMMUNITY): Payer: Self-pay

## 2015-04-14 ENCOUNTER — Other Ambulatory Visit (HOSPITAL_COMMUNITY): Payer: Self-pay | Admitting: Gastroenterology

## 2015-04-14 DIAGNOSIS — K5010 Crohn's disease of large intestine without complications: Secondary | ICD-10-CM | POA: Insufficient documentation

## 2015-04-14 MED ORDER — SODIUM CHLORIDE 0.9 % IV SOLN
400.0000 mg | INTRAVENOUS | Status: DC
  Administered 2015-04-14: 400 mg via INTRAVENOUS
  Filled 2015-04-14: qty 40

## 2015-04-14 MED ORDER — ACETAMINOPHEN 500 MG PO TABS
1000.0000 mg | ORAL_TABLET | ORAL | Status: DC
  Administered 2015-04-14: 1000 mg via ORAL
  Filled 2015-04-14: qty 2

## 2015-04-14 MED ORDER — LORATADINE 10 MG PO TABS
10.0000 mg | ORAL_TABLET | ORAL | Status: DC
Start: 2015-04-14 — End: 2015-04-15

## 2015-04-14 MED ORDER — METHYLPREDNISOLONE SODIUM SUCC 40 MG IJ SOLR
20.0000 mg | INTRAMUSCULAR | Status: DC
  Administered 2015-04-14: 20 mg via INTRAVENOUS
  Filled 2015-04-14: qty 1

## 2015-04-14 MED ORDER — SODIUM CHLORIDE 0.9 % IV SOLN
INTRAVENOUS | Status: DC
  Administered 2015-04-14: 10:00:00 via INTRAVENOUS

## 2015-04-14 MED ORDER — METHYLPREDNISOLONE SODIUM SUCC 125 MG IJ SOLR: 20.0000 mg | INTRAMUSCULAR | Status: DC

## 2015-04-14 NOTE — Discharge Instructions (Signed)
Infliximab injection What is this medicine? INFLIXIMAB (in [place] i mab) is used to treat Crohn's disease and ulcerative colitis. It is also used to treat ankylosing spondylitis, psoriasis, and some forms of arthritis. This medicine may be used for other purposes; ask your health care provider or pharmacist if you have questions. What should I tell my health care provider before I take this medicine? They need to know if you have any of these conditions: -diabetes -exposure to tuberculosis -heart failure -hepatitis or liver disease -immune system problems -infection -lung or breathing disease, like COPD -multiple sclerosis -current or past resident of [name] or [place] -seizure disorder -an unusual or allergic reaction to infliximab, mouse proteins, other medicines, foods, dyes, or preservatives -pregnant or trying to get pregnant -breast-feeding How should I use this medicine? This medicine is for injection into a vein. It is usually given by a health care professional in a hospital or clinic setting. A special MedGuide will be given to you by the pharmacist with each prescription and refill. Be sure to read this information carefully each time. Talk to your pediatrician regarding the use of this medicine in children. Special care may be needed. Overdosage: If you think you have taken too much of this medicine contact a poison control center or emergency room at once. NOTE: This medicine is only for you. Do not share this medicine with others. What if I miss a dose? It is important not to miss your dose. Call your doctor or health care professional if you are unable to keep an appointment. What may interact with this medicine? Do not take this medicine with any of the following medications: -anakinra -rilonacept This medicine may also interact with the following medications: -vaccines This list may not describe all possible interactions. Give your health care provider  a list of all the medicines, herbs, non-prescription drugs, or dietary supplements you use. Also tell them if you smoke, drink alcohol, or use illegal drugs. Some items may interact with your medicine. What should I watch for while using this medicine? Visit your doctor or health care professional for regular checks on your progress. If you get a cold or other infection while receiving this medicine, call your doctor or health care professional. Do not treat yourself. This medicine may decrease your body's ability to fight infections. Before beginning therapy, your doctor may do a test to see if you have been exposed to tuberculosis. This medicine may make the symptoms of heart failure worse in some patients. If you notice symptoms such as increased shortness of breath or swelling of the ankles or legs, contact your health care provider right away. If you are going to have surgery or dental work, tell your health care professional or dentist that you have received this medicine. If you take this medicine for plaque psoriasis, stay out of the sun. If you cannot avoid being in the sun, wear protective clothing and use sunscreen. Do not use sun lamps or tanning beds/booths. What side effects may I notice from receiving this medicine? Side effects that you should report to your doctor or health care professional as soon as possible: -allergic reactions like skin rash, itching or hives, swelling of the face, lips, or tongue -chest pain -fever or chills, usually related to the infusion -muscle or joint pain -red, scaly patches or raised bumps on the skin -signs of infection - fever or chills, cough, sore throat, pain or difficulty passing urine -swollen lymph nodes in the neck, underarm,  or groin areas -unexplained weight loss -unusual bleeding or bruising -unusually weak or tired -yellowing of the eyes or skin Side effects that usually do not require medical attention (report to your doctor or health  care professional if they continue or are bothersome): -headache -heartburn or stomach pain -nausea, vomiting This list may not describe all possible side effects. Call your doctor for medical advice about side effects. You may report side effects to FDA at 1-800-FDA-1088. Where should I keep my medicine? This drug is given in a hospital or clinic and will not be stored at home. NOTE: This sheet is a summary. It may not cover all possible information. If you have questions about this medicine, talk to your doctor, pharmacist, or health care provider.    2016, [employer] Standard. (2007-09-27 10:26:02)

## 2015-06-02 ENCOUNTER — Encounter (HOSPITAL_COMMUNITY): Payer: BLUE CROSS/BLUE SHIELD

## 2015-06-05 DIAGNOSIS — L089 Local infection of the skin and subcutaneous tissue, unspecified: Secondary | ICD-10-CM | POA: Diagnosis not present

## 2015-06-09 ENCOUNTER — Encounter (HOSPITAL_COMMUNITY)
Admission: RE | Admit: 2015-06-09 | Discharge: 2015-06-09 | Disposition: A | Discharge status: Home / Self Care | Payer: BLUE CROSS/BLUE SHIELD | Source: Ambulatory Visit | Attending: Gastroenterology | Admitting: Gastroenterology

## 2015-06-09 ENCOUNTER — Encounter (HOSPITAL_COMMUNITY): Payer: Self-pay

## 2015-06-09 DIAGNOSIS — K5010 Crohn's disease of large intestine without complications: Secondary | ICD-10-CM | POA: Diagnosis not present

## 2015-06-09 MED ORDER — ACETAMINOPHEN 500 MG PO TABS
1000.0000 mg | ORAL_TABLET | ORAL | Status: DC
  Filled 2015-06-09: qty 2

## 2015-06-09 MED ORDER — INFLIXIMAB 100 MG IV SOLR
400.0000 mg | INTRAVENOUS | Status: AC
  Administered 2015-06-09: 400 mg via INTRAVENOUS
  Filled 2015-06-09: qty 40

## 2015-06-09 MED ORDER — METHYLPREDNISOLONE SODIUM SUCC 40 MG IJ SOLR
20.0000 mg | INTRAMUSCULAR | Status: AC
  Administered 2015-06-09: 20 mg via INTRAVENOUS
  Filled 2015-06-09: qty 1

## 2015-06-09 MED ORDER — LORATADINE 10 MG PO TABS
10.0000 mg | ORAL_TABLET | ORAL | Status: DC
Start: 2015-06-09 — End: 2015-06-10
  Filled 2015-06-09: qty 1

## 2015-06-09 MED ORDER — SODIUM CHLORIDE 0.9 % IV SOLN
INTRAVENOUS | Status: AC
  Administered 2015-06-09: 10:00:00 via INTRAVENOUS

## 2015-06-09 NOTE — Progress Notes (Signed)
Pt is on Bactrim 10 day supply for an infected hang nail.  Pt is on day 4.  Called Dr. Hassell Done Johnson's office to confirm if okay to proceed with infusion due to pt being on antibiotics and pt d/t receive Remicade today.  Spoke with Carolan Shiver, RMA and she states okay to give Remicade.

## 2015-08-04 ENCOUNTER — Encounter (HOSPITAL_COMMUNITY)
Admission: RE | Admit: 2015-08-04 | Discharge: 2015-08-04 | Disposition: A | Discharge status: Home / Self Care | Payer: BLUE CROSS/BLUE SHIELD | Source: Ambulatory Visit | Attending: Gastroenterology | Admitting: Gastroenterology

## 2015-08-04 ENCOUNTER — Encounter (HOSPITAL_COMMUNITY): Payer: Self-pay

## 2015-08-04 ENCOUNTER — Other Ambulatory Visit (HOSPITAL_COMMUNITY): Payer: Self-pay | Admitting: Gastroenterology

## 2015-08-04 DIAGNOSIS — K5010 Crohn's disease of large intestine without complications: Secondary | ICD-10-CM | POA: Diagnosis not present

## 2015-08-04 MED ORDER — SODIUM CHLORIDE 0.9 % IV SOLN
INTRAVENOUS | Status: DC
Start: 2015-08-04 — End: 2015-08-05
  Administered 2015-08-04: 10:00:00 via INTRAVENOUS

## 2015-08-04 MED ORDER — LORATADINE 10 MG PO TABS: 10.0000 mg | ORAL_TABLET | ORAL | Status: DC

## 2015-08-04 MED ORDER — METHYLPREDNISOLONE SODIUM SUCC 40 MG IJ SOLR
20.0000 mg | INTRAMUSCULAR | Status: DC
  Administered 2015-08-04: 20 mg via INTRAVENOUS
  Filled 2015-08-04: qty 1

## 2015-08-04 MED ORDER — ACETAMINOPHEN 500 MG PO TABS: 1000.0000 mg | ORAL_TABLET | ORAL | Status: DC

## 2015-08-04 MED ORDER — INFLIXIMAB 100 MG IV SOLR
400.0000 mg | INTRAVENOUS | Status: DC
Start: 2015-08-04 — End: 2015-08-05
  Administered 2015-08-04: 400 mg via INTRAVENOUS
  Filled 2015-08-04: qty 40

## 2015-09-29 ENCOUNTER — Encounter (HOSPITAL_COMMUNITY)
Admission: RE | Admit: 2015-09-29 | Discharge: 2015-09-29 | Disposition: A | Discharge status: Home / Self Care | Payer: BLUE CROSS/BLUE SHIELD | Source: Ambulatory Visit | Attending: Gastroenterology | Admitting: Gastroenterology

## 2015-09-29 ENCOUNTER — Encounter (INDEPENDENT_AMBULATORY_CARE_PROVIDER_SITE_OTHER): Payer: Self-pay

## 2015-09-29 ENCOUNTER — Encounter (HOSPITAL_COMMUNITY): Payer: Self-pay

## 2015-09-29 DIAGNOSIS — K5010 Crohn's disease of large intestine without complications: Secondary | ICD-10-CM | POA: Insufficient documentation

## 2015-09-29 MED ORDER — SODIUM CHLORIDE 0.9 % IV SOLN
INTRAVENOUS | Status: AC
Start: 2015-09-29 — End: 2015-09-29
  Administered 2015-09-29: 09:00:00 via INTRAVENOUS

## 2015-09-29 MED ORDER — METHYLPREDNISOLONE SODIUM SUCC 40 MG IJ SOLR
20.0000 mg | INTRAMUSCULAR | Status: AC
  Administered 2015-09-29: 20 mg via INTRAVENOUS
  Filled 2015-09-29: qty 1

## 2015-09-29 MED ORDER — SODIUM CHLORIDE 0.9 % IV SOLN
400.0000 mg | INTRAVENOUS | Status: AC
Start: 2015-09-29 — End: 2015-09-29
  Administered 2015-09-29: 400 mg via INTRAVENOUS
  Filled 2015-09-29: qty 40

## 2015-09-29 MED ORDER — ACETAMINOPHEN 500 MG PO TABS: 1000.0000 mg | ORAL_TABLET | ORAL | Status: DC

## 2015-09-29 MED ORDER — LORATADINE 10 MG PO TABS: 10.0000 mg | ORAL_TABLET | ORAL | Status: DC

## 2015-11-24 ENCOUNTER — Encounter (HOSPITAL_COMMUNITY): Payer: Self-pay

## 2015-11-24 ENCOUNTER — Encounter (HOSPITAL_COMMUNITY)
Admission: RE | Admit: 2015-11-24 | Discharge: 2015-11-24 | Disposition: A | Discharge status: Home / Self Care | Payer: BLUE CROSS/BLUE SHIELD | Source: Ambulatory Visit | Attending: Gastroenterology | Admitting: Gastroenterology

## 2015-11-24 DIAGNOSIS — K5010 Crohn's disease of large intestine without complications: Secondary | ICD-10-CM | POA: Insufficient documentation

## 2015-11-24 MED ORDER — SODIUM CHLORIDE 0.9 % IV SOLN
400.0000 mg | INTRAVENOUS | Status: DC
  Administered 2015-11-24: 400 mg via INTRAVENOUS
  Filled 2015-11-24: qty 20

## 2015-11-24 MED ORDER — SODIUM CHLORIDE 0.9 % IV SOLN
INTRAVENOUS | Status: DC
  Administered 2015-11-24: 09:00:00 via INTRAVENOUS

## 2015-11-24 MED ORDER — METHYLPREDNISOLONE SODIUM SUCC 40 MG IJ SOLR
20.0000 mg | INTRAMUSCULAR | Status: DC
  Administered 2015-11-24: 20 mg via INTRAVENOUS
  Filled 2015-11-24: qty 1

## 2015-11-24 MED ORDER — LORATADINE 10 MG PO TABS
10.0000 mg | ORAL_TABLET | ORAL | Status: DC
Start: 2015-11-24 — End: 2015-11-25

## 2015-11-24 MED ORDER — ACETAMINOPHEN 500 MG PO TABS
1000.0000 mg | ORAL_TABLET | ORAL | Status: DC
  Administered 2015-11-24: 1000 mg via ORAL
  Filled 2015-11-24: qty 2

## 2015-11-24 NOTE — Discharge Instructions (Signed)
Remicade Infliximab injection What is this medicine? INFLIXIMAB (in [place] i mab) is used to treat Crohn's disease and ulcerative colitis. It is also used to treat ankylosing spondylitis, psoriasis, and some forms of arthritis. This medicine may be used for other purposes; ask your health care provider or pharmacist if you have questions. What should I tell my health care provider before I take this medicine? They need to know if you have any of these conditions: -diabetes -exposure to tuberculosis -heart failure -hepatitis or liver disease -immune system problems -infection -lung or breathing disease, like COPD -multiple sclerosis -current or past resident of [name] or [place] -seizure disorder -an unusual or allergic reaction to infliximab, mouse proteins, other medicines, foods, dyes, or preservatives -pregnant or trying to get pregnant -breast-feeding How should I use this medicine? This medicine is for injection into a vein. It is usually given by a health care professional in a hospital or clinic setting. A special MedGuide will be given to you by the pharmacist with each prescription and refill. Be sure to read this information carefully each time. Talk to your pediatrician regarding the use of this medicine in children. Special care may be needed. Overdosage: If you think you have taken too much of this medicine contact a poison control center or emergency room at once. NOTE: This medicine is only for you. Do not share this medicine with others. What if I miss a dose? It is important not to miss your dose. Call your doctor or health care professional if you are unable to keep an appointment. What may interact with this medicine? Do not take this medicine with any of the following medications: -anakinra -rilonacept This medicine may also interact with the following medications: -vaccines This list may not describe all possible interactions. Give your health care  provider a list of all the medicines, herbs, non-prescription drugs, or dietary supplements you use. Also tell them if you smoke, drink alcohol, or use illegal drugs. Some items may interact with your medicine. What should I watch for while using this medicine? Visit your doctor or health care professional for regular checks on your progress. If you get a cold or other infection while receiving this medicine, call your doctor or health care professional. Do not treat yourself. This medicine may decrease your body's ability to fight infections. Before beginning therapy, your doctor may do a test to see if you have been exposed to tuberculosis. This medicine may make the symptoms of heart failure worse in some patients. If you notice symptoms such as increased shortness of breath or swelling of the ankles or legs, contact your health care provider right away. If you are going to have surgery or dental work, tell your health care professional or dentist that you have received this medicine. If you take this medicine for plaque psoriasis, stay out of the sun. If you cannot avoid being in the sun, wear protective clothing and use sunscreen. Do not use sun lamps or tanning beds/booths. What side effects may I notice from receiving this medicine? Side effects that you should report to your doctor or health care professional as soon as possible: -allergic reactions like skin rash, itching or hives, swelling of the face, lips, or tongue -chest pain -fever or chills, usually related to the infusion -muscle or joint pain -red, scaly patches or raised bumps on the skin -signs of infection - fever or chills, cough, sore throat, pain or difficulty passing urine -swollen lymph nodes in the neck,  underarm, or groin areas -unexplained weight loss -unusual bleeding or bruising -unusually weak or tired -yellowing of the eyes or skin Side effects that usually do not require medical attention (report to your doctor  or health care professional if they continue or are bothersome): -headache -heartburn or stomach pain -nausea, vomiting This list may not describe all possible side effects. Call your doctor for medical advice about side effects. You may report side effects to FDA at 1-800-FDA-1088. Where should I keep my medicine? This drug is given in a hospital or clinic and will not be stored at home. NOTE: This sheet is a summary. It may not cover all possible information. If you have questions about this medicine, talk to your doctor, pharmacist, or health care provider.    2016, [employer] Standard. (2007-09-27 10:26:02)

## 2016-01-21 ENCOUNTER — Encounter (HOSPITAL_COMMUNITY)
Admission: RE | Admit: 2016-01-21 | Discharge: 2016-01-21 | Disposition: A | Discharge status: Home / Self Care | Payer: BLUE CROSS/BLUE SHIELD | Source: Ambulatory Visit | Attending: Gastroenterology | Admitting: Gastroenterology

## 2016-01-21 DIAGNOSIS — K5010 Crohn's disease of large intestine without complications: Secondary | ICD-10-CM | POA: Insufficient documentation

## 2016-01-21 MED ORDER — SODIUM CHLORIDE 0.9 % IV SOLN
INTRAVENOUS | Status: AC
Start: 2016-01-21 — End: 2016-01-21
  Administered 2016-01-21: 08:00:00 via INTRAVENOUS

## 2016-01-21 MED ORDER — METHYLPREDNISOLONE SODIUM SUCC 40 MG IJ SOLR
20.0000 mg | INTRAMUSCULAR | Status: AC
  Administered 2016-01-21: 20 mg via INTRAVENOUS
  Filled 2016-01-21: qty 1

## 2016-01-21 MED ORDER — LORATADINE 10 MG PO TABS
10.0000 mg | ORAL_TABLET | ORAL | Status: DC
Start: 2016-01-21 — End: 2016-01-22

## 2016-01-21 MED ORDER — SODIUM CHLORIDE 0.9 % IV SOLN
400.0000 mg | INTRAVENOUS | Status: AC
Start: 2016-01-21 — End: 2016-01-21
  Administered 2016-01-21: 400 mg via INTRAVENOUS
  Filled 2016-01-21: qty 40

## 2016-01-21 MED ORDER — ACETAMINOPHEN 500 MG PO TABS: 1000.0000 mg | ORAL_TABLET | ORAL | Status: DC

## 2016-03-08 DIAGNOSIS — F17200 Nicotine dependence, unspecified, uncomplicated: Secondary | ICD-10-CM | POA: Diagnosis not present

## 2016-03-08 DIAGNOSIS — N529 Male erectile dysfunction, unspecified: Secondary | ICD-10-CM | POA: Diagnosis not present

## 2016-03-08 DIAGNOSIS — G479 Sleep disorder, unspecified: Secondary | ICD-10-CM | POA: Diagnosis not present

## 2016-03-08 DIAGNOSIS — G2581 Restless legs syndrome: Secondary | ICD-10-CM | POA: Diagnosis not present

## 2016-03-08 DIAGNOSIS — Z23 Encounter for immunization: Secondary | ICD-10-CM | POA: Diagnosis not present

## 2016-03-22 ENCOUNTER — Encounter (HOSPITAL_COMMUNITY): Payer: Self-pay

## 2016-03-22 ENCOUNTER — Encounter (HOSPITAL_COMMUNITY)
Admission: RE | Admit: 2016-03-22 | Discharge: 2016-03-22 | Disposition: A | Discharge status: Home / Self Care | Payer: BLUE CROSS/BLUE SHIELD | Source: Ambulatory Visit | Attending: Gastroenterology | Admitting: Gastroenterology

## 2016-03-22 DIAGNOSIS — K5010 Crohn's disease of large intestine without complications: Secondary | ICD-10-CM | POA: Diagnosis not present

## 2016-03-22 MED ORDER — ACETAMINOPHEN 500 MG PO TABS: 1000.0000 mg | ORAL_TABLET | ORAL | Status: DC

## 2016-03-22 MED ORDER — SODIUM CHLORIDE 0.9 % IV SOLN
INTRAVENOUS | Status: DC
  Administered 2016-03-22: 09:00:00 via INTRAVENOUS

## 2016-03-22 MED ORDER — SODIUM CHLORIDE 0.9 % IV SOLN
400.0000 mg | INTRAVENOUS | Status: DC
  Administered 2016-03-22: 400 mg via INTRAVENOUS
  Filled 2016-03-22: qty 40

## 2016-03-22 MED ORDER — LORATADINE 10 MG PO TABS
10.0000 mg | ORAL_TABLET | ORAL | Status: DC
Start: 2016-03-22 — End: 2016-03-23

## 2016-03-22 MED ORDER — METHYLPREDNISOLONE SODIUM SUCC 40 MG IJ SOLR
20.0000 mg | INTRAMUSCULAR | Status: DC
  Administered 2016-03-22: 20 mg via INTRAVENOUS
  Filled 2016-03-22: qty 1

## 2016-03-22 NOTE — Discharge Instructions (Signed)
Infliximab injection What is this medicine? INFLIXIMAB (in [place] i mab) is used to treat Crohn's disease and ulcerative colitis. It is also used to treat ankylosing spondylitis, psoriasis, and some forms of arthritis. This medicine may be used for other purposes; ask your health care provider or pharmacist if you have questions. COMMON BRAND NAME(S): INFLECTRA, Remicade, RENFLEXIS What should I tell my health care provider before I take this medicine? They need to know if you have any of these conditions: -diabetes -exposure to tuberculosis -heart failure -hepatitis or liver disease -immune system problems -infection -lung or breathing disease, like COPD -multiple sclerosis -current or past resident of [name] or [place] -seizure disorder -an unusual or allergic reaction to infliximab, mouse proteins, other medicines, foods, dyes, or preservatives -pregnant or trying to get pregnant -breast-feeding How should I use this medicine? This medicine is for injection into a vein. It is usually given by a health care professional in a hospital or clinic setting. A special MedGuide will be given to you by the pharmacist with each prescription and refill. Be sure to read this information carefully each time. Talk to your pediatrician regarding the use of this medicine in children. Special care may be needed. Overdosage: If you think you have taken too much of this medicine contact a poison control center or emergency room at once. NOTE: This medicine is only for you. Do not share this medicine with others. What if I miss a dose? It is important not to miss your dose. Call your doctor or health care professional if you are unable to keep an appointment. What may interact with this medicine? Do not take this medicine with any of the following medications: -anakinra -rilonacept This medicine may also interact with the following medications: -vaccines This list may not describe all  possible interactions. Give your health care provider a list of all the medicines, herbs, non-prescription drugs, or dietary supplements you use. Also tell them if you smoke, drink alcohol, or use illegal drugs. Some items may interact with your medicine. What should I watch for while using this medicine? Visit your doctor or health care professional for regular checks on your progress. If you get a cold or other infection while receiving this medicine, call your doctor or health care professional. Do not treat yourself. This medicine may decrease your body's ability to fight infections. Before beginning therapy, your doctor may do a test to see if you have been exposed to tuberculosis. This medicine may make the symptoms of heart failure worse in some patients. If you notice symptoms such as increased shortness of breath or swelling of the ankles or legs, contact your health care provider right away. If you are going to have surgery or dental work, tell your health care professional or dentist that you have received this medicine. If you take this medicine for plaque psoriasis, stay out of the sun. If you cannot avoid being in the sun, wear protective clothing and use sunscreen. Do not use sun lamps or tanning beds/booths. What side effects may I notice from receiving this medicine? Side effects that you should report to your doctor or health care professional as soon as possible: -allergic reactions like skin rash, itching or hives, swelling of the face, lips, or tongue -chest pain -fever or chills, usually related to the infusion -muscle or joint pain -red, scaly patches or raised bumps on the skin -signs of infection - fever or chills, cough, sore throat, pain or difficulty passing urine -swollen  lymph nodes in the neck, underarm, or groin areas -unexplained weight loss -unusual bleeding or bruising -unusually weak or tired -yellowing of the eyes or skin Side effects that usually do not  require medical attention (report to your doctor or health care professional if they continue or are bothersome): -headache -heartburn or stomach pain -nausea, vomiting This list may not describe all possible side effects. Call your doctor for medical advice about side effects. You may report side effects to FDA at 1-800-FDA-1088. Where should I keep my medicine? This drug is given in a hospital or clinic and will not be stored at home. NOTE: This sheet is a summary. It may not cover all possible information. If you have questions about this medicine, talk to your doctor, pharmacist, or health care provider.  2017 [employer]vier/[employer] Standard (2007-09-27 10:26:02)

## 2016-05-10 DIAGNOSIS — J069 Acute upper respiratory infection, unspecified: Secondary | ICD-10-CM | POA: Diagnosis not present

## 2016-05-17 ENCOUNTER — Encounter (HOSPITAL_COMMUNITY): Admission: RE | Admit: 2016-05-17 | Payer: BLUE CROSS/BLUE SHIELD | Source: Ambulatory Visit

## 2016-05-24 ENCOUNTER — Encounter (HOSPITAL_COMMUNITY): Payer: Self-pay

## 2016-05-24 ENCOUNTER — Encounter (HOSPITAL_COMMUNITY)
Admission: RE | Admit: 2016-05-24 | Discharge: 2016-05-24 | Disposition: A | Discharge status: Home / Self Care | Payer: BLUE CROSS/BLUE SHIELD | Source: Ambulatory Visit | Attending: Gastroenterology | Admitting: Gastroenterology

## 2016-05-24 DIAGNOSIS — K5010 Crohn's disease of large intestine without complications: Secondary | ICD-10-CM | POA: Diagnosis not present

## 2016-05-24 MED ORDER — SODIUM CHLORIDE 0.9 % IV SOLN
400.0000 mg | INTRAVENOUS | Status: AC
  Administered 2016-05-24: 400 mg via INTRAVENOUS
  Filled 2016-05-24: qty 40

## 2016-05-24 MED ORDER — METHYLPREDNISOLONE SODIUM SUCC 40 MG IJ SOLR
20.0000 mg | INTRAMUSCULAR | Status: AC
  Administered 2016-05-24: 20 mg via INTRAVENOUS
  Filled 2016-05-24: qty 1

## 2016-05-24 MED ORDER — SODIUM CHLORIDE 0.9 % IV SOLN
INTRAVENOUS | Status: AC
  Administered 2016-05-24: 08:00:00 via INTRAVENOUS

## 2016-07-12 ENCOUNTER — Encounter (HOSPITAL_COMMUNITY): Payer: BLUE CROSS/BLUE SHIELD

## 2016-07-20 ENCOUNTER — Ambulatory Visit (HOSPITAL_COMMUNITY)
Admission: RE | Admit: 2016-07-20 | Discharge: 2016-07-20 | Disposition: A | Discharge status: Home / Self Care | Payer: BLUE CROSS/BLUE SHIELD | Source: Ambulatory Visit | Attending: Gastroenterology | Admitting: Gastroenterology

## 2016-07-20 ENCOUNTER — Encounter (HOSPITAL_COMMUNITY): Payer: Self-pay

## 2016-07-20 DIAGNOSIS — K5010 Crohn's disease of large intestine without complications: Secondary | ICD-10-CM | POA: Insufficient documentation

## 2016-07-20 MED ORDER — SODIUM CHLORIDE 0.9 % IV SOLN
400.0000 mg | INTRAVENOUS | Status: DC
  Administered 2016-07-20: 400 mg via INTRAVENOUS
  Filled 2016-07-20: qty 40

## 2016-07-20 MED ORDER — SODIUM CHLORIDE 0.9 % IV SOLN
INTRAVENOUS | Status: DC
  Administered 2016-07-20: 10:00:00 via INTRAVENOUS

## 2016-07-20 MED ORDER — ACETAMINOPHEN 500 MG PO TABS: 1000.0000 mg | ORAL_TABLET | ORAL | Status: DC

## 2016-07-20 MED ORDER — METHYLPREDNISOLONE SODIUM SUCC 40 MG IJ SOLR
20.0000 mg | INTRAMUSCULAR | Status: DC
  Administered 2016-07-20: 20 mg via INTRAVENOUS
  Filled 2016-07-20: qty 1

## 2016-07-20 MED ORDER — LORATADINE 10 MG PO TABS: 10.0000 mg | ORAL_TABLET | ORAL | Status: DC

## 2016-07-20 NOTE — Discharge Instructions (Signed)
When you know which doctor will be following you/ordering Remicade please call and tell us so we may obtain orders from them for your next appt in July. Thanks!   [phone]        Remicade Infliximab injection What is this medicine? INFLIXIMAB (in [place] i mab) is used to treat Crohn's disease and ulcerative colitis. It is also used to treat ankylosing spondylitis, plaque psoriasis, and some forms of arthritis. This medicine may be used for other purposes; ask your health care provider or pharmacist if you have questions. COMMON BRAND NAME(S): INFLECTRA, Remicade, RENFLEXIS What should I tell my health care provider before I take this medicine? They need to know if you have any of these conditions: -cancer -current or past resident of [name] or [place] -diabetes -exposure to tuberculosis -Guillain-Barre syndrome -heart failure -hepatitis or liver disease -immune system problems -infection -lung or breathing disease, like COPD -multiple sclerosis -receiving phototherapy for the skin -seizure disorder -an unusual or allergic reaction to infliximab, mouse proteins, other medicines, foods, dyes, or preservatives -pregnant or trying to get pregnant -breast-feeding How should I use this medicine? This medicine is for injection into a vein. It is usually given by a health care professional in a hospital or clinic setting. A special MedGuide will be given to you by the pharmacist with each prescription and refill. Be sure to read this information carefully each time. Talk to your pediatrician regarding the use of this medicine in children. While this drug may be prescribed for children as young as 47 years of age for selected conditions, precautions do apply. Overdosage: If you think you have taken too much of this medicine contact a poison control center or emergency room at once. NOTE: This medicine is only for you. Do not share this medicine with others. What if I  miss a dose? It is important not to miss your dose. Call your doctor or health care professional if you are unable to keep an appointment. What may interact with this medicine? Do not take this medicine with any of the following medications: -biologic medicines such as abatacept, adalimumab, anakinra, certolizumab, etanercept, golimumab, rituximab, secukinumab, tocilizumab, tofactinib, ustekinumab -live vaccines This list may not describe all possible interactions. Give your health care provider a list of all the medicines, herbs, non-prescription drugs, or dietary supplements you use. Also tell them if you smoke, drink alcohol, or use illegal drugs. Some items may interact with your medicine. What should I watch for while using this medicine? Your condition will be monitored carefully while you are receiving this medicine. Visit your doctor or health care professional for regular checks on your progress. You may need blood work done while you are taking this medicine. Before beginning therapy, your doctor may do a test to see if you have been exposed to tuberculosis. Call your doctor or health care professional for advice if you get a fever, chills or sore throat, or other symptoms of a cold or flu. Do not treat yourself. This drug decreases your body's ability to fight infections. Try to avoid being around people who are sick. This medicine may make the symptoms of heart failure worse in some patients. If you notice symptoms such as increased shortness of breath or swelling of the ankles or legs, contact your health care provider right away. If you are going to have surgery or dental work, tell your health care professional or dentist that you have received this medicine. If you take this medicine for plaque  psoriasis, stay out of the sun. If you cannot avoid being in the sun, wear protective clothing and use sunscreen. Do not use sun lamps or tanning beds/booths. Talk to your doctor about your risk  of cancer. You may be more at risk for certain types of cancers if you take this medicine. What side effects may I notice from receiving this medicine? Side effects that you should report to your doctor or health care professional as soon as possible: -allergic reactions like skin rash, itching or hives, swelling of the face, lips, or tongue -breathing problems -changes in vision -chest pain -fever or chills, usually related to the infusion -joint pain -pain, tingling, numbness in the hands or feet -redness, blistering, peeling or loosening of the skin, including inside the mouth -seizures -signs of infection - fever or chills, cough, sore throat, flu-like symptoms, pain or difficulty passing urine -signs and symptoms of liver injury like dark yellow or brown urine; general ill feeling; light-colored stools; loss of appetite; nausea; right upper belly pain; unusually weak or tired; yellowing of the eyes or skin -signs and symptoms of a stroke like changes in vision; confusion; trouble speaking or understanding; severe headaches; sudden numbness or weakness of the face, arm or leg; trouble walking; dizziness; loss of balance or coordination -swelling of the ankles, feet, or hands -swollen lymph nodes in the neck, underarm, or groin areas -unusual bleeding or bruising -unusually weak or tired Side effects that usually do not require medical attention (report to your doctor or health care professional if they continue or are bothersome): -headache -nausea -stomach pain -upset stomach This list may not describe all possible side effects. Call your doctor for medical advice about side effects. You may report side effects to FDA at 1-800-FDA-1088. Where should I keep my medicine? This drug is given in a hospital or clinic and will not be stored at home. NOTE: This sheet is a summary. It may not cover all possible information. If you have questions about this medicine, talk to your doctor,  pharmacist, or health care provider.  2018 Elsevier/Gold Standard (2016-03-10 13:45:32)

## 2016-08-11 ENCOUNTER — Encounter: Payer: Self-pay | Admitting: Gastroenterology

## 2016-09-07 DIAGNOSIS — K5100 Ulcerative (chronic) pancolitis without complications: Secondary | ICD-10-CM | POA: Diagnosis not present

## 2016-09-07 DIAGNOSIS — R5383 Other fatigue: Secondary | ICD-10-CM | POA: Diagnosis not present

## 2016-09-07 DIAGNOSIS — K5010 Crohn's disease of large intestine without complications: Secondary | ICD-10-CM | POA: Diagnosis not present

## 2016-09-07 DIAGNOSIS — Z79899 Other long term (current) drug therapy: Secondary | ICD-10-CM | POA: Diagnosis not present

## 2016-09-09 ENCOUNTER — Other Ambulatory Visit: Payer: Self-pay

## 2016-09-09 ENCOUNTER — Telehealth: Payer: Self-pay

## 2016-09-09 DIAGNOSIS — K50119 Crohn's disease of large intestine with unspecified complications: Secondary | ICD-10-CM

## 2016-09-09 NOTE — Telephone Encounter (Signed)
Orders placed for Remicade infusion at [facility] short stay, spoke to [facility] she confirmed that she could see these orders.

## 2016-09-09 NOTE — Telephone Encounter (Signed)
-----   Message from Manus Gunning, MD sent at 09/09/2016 11:38 AM EDT ----- Faythe Ghee. That is not ideal but I will sign off on his Remicade for this time and he must see me in clinic to establish otherwise. Thanks  ----- Message ----- From: Doristine Counter, RN Sent: 09/09/2016   9:37 AM To: Manus Gunning, MD  [facility] short stay called about getting Remicade orders on this Dr. Wynetta Emery patient. His infusion is on 7/24 but he will not be seen by you until 8/7. Please advise, thank you! Almyra Free

## 2016-09-14 ENCOUNTER — Ambulatory Visit (HOSPITAL_COMMUNITY)
Admission: RE | Admit: 2016-09-14 | Discharge: 2016-09-14 | Disposition: A | Discharge status: Home / Self Care | Payer: BLUE CROSS/BLUE SHIELD | Source: Ambulatory Visit | Attending: Gastroenterology | Admitting: Gastroenterology

## 2016-09-14 DIAGNOSIS — R5383 Other fatigue: Secondary | ICD-10-CM | POA: Diagnosis not present

## 2016-09-14 DIAGNOSIS — K5010 Crohn's disease of large intestine without complications: Secondary | ICD-10-CM | POA: Diagnosis not present

## 2016-09-28 ENCOUNTER — Encounter (INDEPENDENT_AMBULATORY_CARE_PROVIDER_SITE_OTHER): Payer: Self-pay

## 2016-09-28 ENCOUNTER — Ambulatory Visit (INDEPENDENT_AMBULATORY_CARE_PROVIDER_SITE_OTHER): Payer: BLUE CROSS/BLUE SHIELD | Admitting: Gastroenterology

## 2016-09-28 ENCOUNTER — Encounter: Payer: Self-pay | Admitting: Gastroenterology

## 2016-09-28 VITALS — BP 124/68 | HR 104 | Ht 71.0 in | Wt 217.5 lb

## 2016-09-28 DIAGNOSIS — Z79899 Other long term (current) drug therapy: Secondary | ICD-10-CM | POA: Diagnosis not present

## 2016-09-28 DIAGNOSIS — K50819 Crohn's disease of both small and large intestine with unspecified complications: Secondary | ICD-10-CM

## 2016-09-28 MED ORDER — NA SULFATE-K SULFATE-MG SULF 17.5-3.13-1.6 GM/177ML PO SOLN: 1.0000 | Freq: Once | ORAL | 0 refills | Status: AC

## 2016-09-28 NOTE — Patient Instructions (Signed)
If you are age 45 or older, your body mass index should be between 23-30. Your Body mass index is 30.34 kg/m. If this is out of the aforementioned range listed, please consider follow up with your Primary Care Provider.  If you are age 16 or younger, your body mass index should be between 19-25. Your Body mass index is 30.34 kg/m. If this is out of the aformentioned range listed, please consider follow up with your Primary Care Provider.   We have sent the following medications to your pharmacy for you to pick up at your convenience:  [place] have been scheduled for a colonoscopy. Please follow written instructions given to you at your visit today.  Please pick up your prep supplies at the pharmacy within the next 1-3 days. If you use inhalers (even only as needed), please bring them with you on the day of your procedure. Your physician has requested that you go to www.startemmi.com and enter the access code given to you at your visit today. This web site gives a general overview about your procedure. However, you should still follow specific instructions given to you by our office regarding your preparation for the procedure.  Thank you
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
## 2016-10-07 ENCOUNTER — Telehealth: Payer: Self-pay | Admitting: Gastroenterology

## 2016-10-07 NOTE — Telephone Encounter (Signed)
Received labwork from patient's primary care as follows:  Drawn on 09/14/16:  CBC - WBC 5.6, Hgb 14.0, HCT 41.2, Plt 194  CRP 0.5  Quantiferon gold NEGATIVE  CMET normal other than isolate ALT elevation to 53  Negative Hep B surface Ag and hep C AB    Caryl Pina can you please let the patient know I received his labs. One of his liver enzymes is just mildly elevated. I would like him to repeat LFTs in 1 month. If they are persistently elevated we may pursue additional workup. Thanks

## 2016-10-11 ENCOUNTER — Other Ambulatory Visit: Payer: Self-pay

## 2016-10-11 DIAGNOSIS — R7989 Other specified abnormal findings of blood chemistry: Secondary | ICD-10-CM

## 2016-10-11 DIAGNOSIS — R945 Abnormal results of liver function studies: Secondary | ICD-10-CM

## 2016-10-11 NOTE — Telephone Encounter (Signed)
Left message for pt informing that we did receive his labs. Informed that his LFTs were slightly elevated and that they needed to be repeated in one month.  The order for LFTs have been put in and instructed pt to come in to the lab in a few weeks between 7:30am-5:30pm.

## 2016-10-22 ENCOUNTER — Telehealth: Payer: Self-pay | Admitting: Gastroenterology

## 2016-10-22 NOTE — Telephone Encounter (Signed)
Informed patient I can leave him a free sample of Suprep at the front desk if he can pick it up. Patient states he will come by next week.

## 2016-10-22 NOTE — Telephone Encounter (Signed)
Left a message for patient to return my call. 

## 2016-10-27 ENCOUNTER — Other Ambulatory Visit (INDEPENDENT_AMBULATORY_CARE_PROVIDER_SITE_OTHER): Payer: BLUE CROSS/BLUE SHIELD

## 2016-10-27 DIAGNOSIS — R945 Abnormal results of liver function studies: Secondary | ICD-10-CM | POA: Diagnosis not present

## 2016-10-27 DIAGNOSIS — R7989 Other specified abnormal findings of blood chemistry: Secondary | ICD-10-CM

## 2016-10-27 LAB — HEPATIC FUNCTION PANEL
ALBUMIN: 4.3 g/dL (ref 3.5–5.2)
ALT: 44 U/L (ref 0–53)
AST: 24 U/L (ref 0–37)
Alkaline Phosphatase: 65 U/L (ref 39–117)
Bilirubin, Direct: 0.1 mg/dL (ref 0.0–0.3)
Total Bilirubin: 0.6 mg/dL (ref 0.2–1.2)
Total Protein: 6.8 g/dL (ref 6.0–8.3)

## 2016-10-28 ENCOUNTER — Encounter: Payer: Self-pay | Admitting: Gastroenterology

## 2016-10-29 ENCOUNTER — Other Ambulatory Visit: Payer: Self-pay

## 2016-11-07 DIAGNOSIS — J019 Acute sinusitis, unspecified: Secondary | ICD-10-CM | POA: Diagnosis not present

## 2016-11-09 ENCOUNTER — Ambulatory Visit (HOSPITAL_COMMUNITY): Admission: RE | Admit: 2016-11-09 | Payer: BLUE CROSS/BLUE SHIELD | Source: Ambulatory Visit

## 2016-11-17 ENCOUNTER — Encounter: Payer: Self-pay | Admitting: Gastroenterology

## 2016-11-24 ENCOUNTER — Ambulatory Visit (AMBULATORY_SURGERY_CENTER): Payer: BLUE CROSS/BLUE SHIELD | Admitting: Gastroenterology

## 2016-11-24 ENCOUNTER — Encounter: Payer: Self-pay | Admitting: Gastroenterology

## 2016-11-24 VITALS — BP 139/82 | HR 91 | Temp 98.9°F | Resp 18 | Ht 71.0 in | Wt 217.0 lb

## 2016-11-24 DIAGNOSIS — D124 Benign neoplasm of descending colon: Secondary | ICD-10-CM | POA: Diagnosis not present

## 2016-11-24 DIAGNOSIS — K635 Polyp of colon: Secondary | ICD-10-CM

## 2016-11-24 DIAGNOSIS — D120 Benign neoplasm of cecum: Secondary | ICD-10-CM | POA: Diagnosis not present

## 2016-11-24 DIAGNOSIS — K50819 Crohn's disease of both small and large intestine with unspecified complications: Secondary | ICD-10-CM | POA: Diagnosis not present

## 2016-11-24 DIAGNOSIS — K5090 Crohn's disease, unspecified, without complications: Secondary | ICD-10-CM | POA: Diagnosis not present

## 2016-11-24 MED ORDER — SODIUM CHLORIDE 0.9 % IV SOLN: 500.0000 mL | INTRAVENOUS | Status: DC

## 2016-11-24 NOTE — Patient Instructions (Signed)
YOU HAD AN ENDOSCOPIC PROCEDURE TODAY AT [facility]:   Refer to the procedure report that was given to you for any specific questions about what was found during the examination.  If the procedure report does not answer your questions, please call your gastroenterologist to clarify.  If you requested that your care partner not be given the details of your procedure findings, then the procedure report has been included in a sealed envelope for you to review at your convenience later.  YOU SHOULD EXPECT: Some feelings of bloating in the abdomen. Passage of more gas than usual.  Walking can help get rid of the air that was put into your GI tract during the procedure and reduce the bloating. If you had a lower endoscopy (such as a colonoscopy or flexible sigmoidoscopy) you may notice spotting of blood in your stool or on the toilet paper. If you underwent a bowel prep for your procedure, you may not have a normal bowel movement for a few days.  Please Note:  You might notice some irritation and congestion in your nose or some drainage.  This is from the oxygen used during your procedure.  There is no need for concern and it should clear up in a day or so.  SYMPTOMS TO REPORT IMMEDIATELY:   Following lower endoscopy (colonoscopy or flexible sigmoidoscopy):  Excessive amounts of blood in the stool  Significant tenderness or worsening of abdominal pains  Swelling of the abdomen that is new, acute  Fever of 100F or higher   Please read all handouts given to you by your recovery nurse.  For urgent or emergent issues, a gastroenterologist can be reached at any hour by calling [phone].   DIET:  We do recommend a small meal at first, but then you may proceed to your regular diet.  Drink plenty of fluids but you should avoid alcoholic beverages for 24 hours.  ACTIVITY:  You should plan to take it easy for the rest of today and you should NOT DRIVE or use heavy machinery until  tomorrow (because of the sedation medicines used during the test).    FOLLOW UP: Our staff will call the number listed on your records the next business day following your procedure to check on you and address any questions or concerns that you may have regarding the information given to you following your procedure. If we do not reach you, we will leave a message.  However, if you are feeling well and you are not experiencing any problems, there is no need to return our call.  We will assume that you have returned to your regular daily activities without incident.  If any biopsies were taken you will be contacted by phone or by letter within the next 1-3 weeks.  Please call us at [phone] if you have not heard about the biopsies in 3 weeks.    SIGNATURES/CONFIDENTIALITY: You and/or your care partner have signed paperwork which will be entered into your electronic medical record.  These signatures attest to the fact that that the information above on your After Visit Summary has been reviewed and is understood.  Full responsibility of the confidentiality of this discharge information lies with you and/or your care-partner.  Thank you for letting us take care of your healthcare needs today.

## 2016-11-24 NOTE — Progress Notes (Signed)
Report given to PACU, vss 

## 2016-11-24 NOTE — Progress Notes (Signed)
Called to room to assist during endoscopic procedure.  Patient ID and intended procedure confirmed with present staff. Received instructions for my participation in the procedure from the performing physician.  

## 2016-11-24 NOTE — Op Note (Signed)
[name] Patient Name: [name] Procedure Date: 11/24/2016 10:36 AM MRN: [id number] Endoscopist: Remo Lipps P. Yahaira Bruski MD, MD Age: 46 Referring MD:  Date of Birth: 1970-06-08 Gender: Male Account #: [id number] Procedure:                Colonoscopy Indications:              High risk colon cancer surveillance: Crohn's small                            and large intestine, on Remicade with good control                            of disease Medicines:                Monitored Anesthesia Care Procedure:                Pre-Anesthesia Assessment:                           - Prior to the procedure, a History and Physical                            was performed, and patient medications and                            allergies were reviewed. The patient's tolerance of                            previous anesthesia was also reviewed. The risks                            and benefits of the procedure and the sedation                            options and risks were discussed with the patient.                            All questions were answered, and informed consent                            was obtained. Prior Anticoagulants: The patient has                            taken no previous anticoagulant or antiplatelet                            agents. ASA Grade Assessment: II - A patient with                            mild systemic disease. After reviewing the risks                            and benefits, the patient was deemed in  satisfactory condition to undergo the procedure.                           After obtaining informed consent, the colonoscope                            was passed under direct vision. Throughout the                            procedure, the patient's blood pressure, pulse, and                            oxygen saturations were monitored continuously. The                            Model CF-HQ190L [id number]) scope was  introduced                            through the anus and advanced to the the terminal                            ileum, with identification of the appendiceal                            orifice and IC valve. The colonoscopy was performed                            without difficulty. The patient tolerated the                            procedure well. The quality of the bowel                            preparation was good. The terminal ileum, ileocecal                            valve, appendiceal orifice, and rectum were                            photographed. Scope In: 10:44:15 AM Scope Out: 11:01:03 AM Scope Withdrawal Time: 0 hours 15 minutes 34 seconds  Total Procedure Duration: 0 hours 16 minutes 48 seconds  Findings:                 The perianal and digital rectal examinations were                            normal.                           The terminal ileum appeared normal.                           A 6 mm polyp was found in the cecum. The polyp was  sessile. The polyp was removed with a cold snare.                            Resection and retrieval were complete.                           A 5 mm polyp was found in the descending colon. The                            polyp was sessile. The polyp was removed with a                            cold snare. Resection and retrieval were complete.                           Multiple medium-mouthed diverticula were found in                            the sigmoid colon and ascending colon.                           There was evidence of a prior end-to-end                            colo-colonic anastomosis in the sigmoid colon. This                            was patent and was characterized by healthy                            appearing mucosa.                           Anal papilla(e) were hypertrophied.                           There were a few suspected pseudoinflammatory                             polyps in the left colon, biopsies taken. The exam                            was otherwise without abnormality. No active                            inflammation noted.                           Biopsies were taken with a cold forceps in the                            rectum, in the sigmoid colon, in the descending  colon, in the transverse colon, at the hepatic                            flexure, in the ascending colon and in the cecum                            for histology. Complications:            No immediate complications. Estimated blood loss:                            Minimal. Estimated Blood Loss:     Estimated blood loss was minimal. Impression:               - The examined portion of the ileum was normal.                           - One 6 mm polyp in the cecum, removed with a cold                            snare. Resected and retrieved.                           - One 5 mm polyp in the descending colon, removed                            with a cold snare. Resected and retrieved.                           - Diverticulosis in the sigmoid colon and in the                            ascending colon.                           - Patent end-to-end colo-colonic anastomosis,                            characterized by healthy appearing mucosa.                           - Anal papilla(e) were hypertrophied.                           - A few suspected pseudoinflammatory polyps of the                            left colon.                           - The examination was otherwise normal.                           - Biopsies were taken with a cold forceps for  histology in the rectum, in the sigmoid colon, in                            the descending colon, in the transverse colon, at                            the hepatic flexure, in the ascending colon and in                            the cecum.                           Overall,  good control of Crohn's disease on present                            regimen. Biopsies taken for surveillance. Recommendation:           - Patient has a contact number available for                            emergencies. The signs and symptoms of potential                            delayed complications were discussed with the                            patient. Return to normal activities tomorrow.                            Written discharge instructions were provided to the                            patient.                           - Resume previous diet.                           - Continue present medications.                           - Await pathology results.                           - Repeat colonoscopy for surveillance based on                            pathology results. Remo Lipps P. Nikesh Teschner MD, MD 11/24/2016 11:11:24 AM This report has been signed electronically.

## 2016-11-25 ENCOUNTER — Telehealth: Payer: Self-pay

## 2016-11-25 DIAGNOSIS — K5010 Crohn's disease of large intestine without complications: Secondary | ICD-10-CM | POA: Diagnosis not present

## 2016-11-25 NOTE — Telephone Encounter (Signed)
Left message on answering machine. 

## 2016-11-25 NOTE — Telephone Encounter (Signed)
  Follow up Call-  Call back number 11/24/2016  Post procedure Call Back phone  # [phone]  Permission to leave phone message Yes  Some recent data might be hidden     Patient questions:  Do you have a fever, pain , or abdominal swelling? No. Pain Score  0 *  Have you tolerated food without any problems? Yes.    Have you been able to return to your normal activities? Yes.    Do you have any questions about your discharge instructions: Diet   No. Medications  No. Follow up visit  No.  Do you have questions or concerns about your Care? No.  Actions: * If pain score is 4 or above: No action needed, pain <4.

## 2016-11-30 ENCOUNTER — Encounter: Payer: Self-pay | Admitting: Gastroenterology

## 2017-01-04 ENCOUNTER — Ambulatory Visit (HOSPITAL_COMMUNITY): Admission: RE | Admit: 2017-01-04 | Payer: BLUE CROSS/BLUE SHIELD | Source: Ambulatory Visit

## 2017-01-20 DIAGNOSIS — K5010 Crohn's disease of large intestine without complications: Secondary | ICD-10-CM | POA: Diagnosis not present

## 2017-03-11 DIAGNOSIS — Z13220 Encounter for screening for lipoid disorders: Secondary | ICD-10-CM | POA: Diagnosis not present

## 2017-03-11 DIAGNOSIS — K5010 Crohn's disease of large intestine without complications: Secondary | ICD-10-CM | POA: Diagnosis not present

## 2017-03-11 DIAGNOSIS — Z0001 Encounter for general adult medical examination with abnormal findings: Secondary | ICD-10-CM | POA: Diagnosis not present

## 2017-03-11 DIAGNOSIS — Z125 Encounter for screening for malignant neoplasm of prostate: Secondary | ICD-10-CM | POA: Diagnosis not present

## 2017-03-11 DIAGNOSIS — Z5181 Encounter for therapeutic drug level monitoring: Secondary | ICD-10-CM | POA: Diagnosis not present

## 2017-03-11 DIAGNOSIS — Z23 Encounter for immunization: Secondary | ICD-10-CM | POA: Diagnosis not present

## 2017-03-11 DIAGNOSIS — G2581 Restless legs syndrome: Secondary | ICD-10-CM | POA: Diagnosis not present

## 2017-03-17 DIAGNOSIS — Z79899 Other long term (current) drug therapy: Secondary | ICD-10-CM | POA: Diagnosis not present

## 2017-03-25 DIAGNOSIS — R220 Localized swelling, mass and lump, head: Secondary | ICD-10-CM | POA: Diagnosis not present

## 2017-04-19 DIAGNOSIS — G4733 Obstructive sleep apnea (adult) (pediatric): Secondary | ICD-10-CM | POA: Diagnosis not present

## 2017-05-12 DIAGNOSIS — K5010 Crohn's disease of large intestine without complications: Secondary | ICD-10-CM | POA: Diagnosis not present

## 2017-05-25 ENCOUNTER — Encounter: Payer: Self-pay | Admitting: Gastroenterology

## 2017-07-07 DIAGNOSIS — K5010 Crohn's disease of large intestine without complications: Secondary | ICD-10-CM | POA: Diagnosis not present

## 2017-07-07 DIAGNOSIS — Z79899 Other long term (current) drug therapy: Secondary | ICD-10-CM | POA: Diagnosis not present

## 2017-09-01 DIAGNOSIS — K5010 Crohn's disease of large intestine without complications: Secondary | ICD-10-CM | POA: Diagnosis not present

## 2017-09-07 DIAGNOSIS — K5010 Crohn's disease of large intestine without complications: Secondary | ICD-10-CM | POA: Diagnosis not present

## 2017-09-07 DIAGNOSIS — K5100 Ulcerative (chronic) pancolitis without complications: Secondary | ICD-10-CM | POA: Diagnosis not present

## 2017-09-07 DIAGNOSIS — Z79899 Other long term (current) drug therapy: Secondary | ICD-10-CM | POA: Diagnosis not present

## 2017-10-27 DIAGNOSIS — K5010 Crohn's disease of large intestine without complications: Secondary | ICD-10-CM | POA: Diagnosis not present

## 2017-10-27 DIAGNOSIS — Z79899 Other long term (current) drug therapy: Secondary | ICD-10-CM | POA: Diagnosis not present

## 2017-11-08 ENCOUNTER — Encounter (INDEPENDENT_AMBULATORY_CARE_PROVIDER_SITE_OTHER): Payer: Self-pay

## 2017-11-08 ENCOUNTER — Ambulatory Visit: Payer: BLUE CROSS/BLUE SHIELD | Admitting: Gastroenterology

## 2017-11-08 ENCOUNTER — Encounter: Payer: Self-pay | Admitting: Gastroenterology

## 2017-11-08 VITALS — BP 110/74 | HR 96 | Ht 71.0 in | Wt 201.2 lb

## 2017-11-08 DIAGNOSIS — K50819 Crohn's disease of both small and large intestine with unspecified complications: Secondary | ICD-10-CM | POA: Diagnosis not present

## 2017-11-08 DIAGNOSIS — Z23 Encounter for immunization: Secondary | ICD-10-CM

## 2017-11-08 DIAGNOSIS — K50119 Crohn's disease of large intestine with unspecified complications: Secondary | ICD-10-CM

## 2017-11-08 DIAGNOSIS — Z79899 Other long term (current) drug therapy: Secondary | ICD-10-CM | POA: Diagnosis not present

## 2017-11-08 NOTE — Patient Instructions (Addendum)
  If you are age 46 or younger, your body mass index should be between 19-25. Your Body mass index is 28.07 kg/m. If this is out of the aformentioned range listed, please consider follow up with your Primary Care Provider.   We are giving you a flu shot today.  We will request lab records from Dr. Trudie Reed with [facility].   You will be due for a follow up appointment ion 6 to 9 months.   Thank you for entrusting me with your care and for choosing [facility], Dr. [place] Cellar
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
## 2017-11-16 ENCOUNTER — Telehealth: Payer: Self-pay | Admitting: Gastroenterology

## 2017-11-16 NOTE — Telephone Encounter (Signed)
Lab results received from Dr. Trudie Reed office for this patient:  10/30/2017:  Hgb 14.0, MCV 90, WBC 6.2, plt 232  CRP undetectable  LFTs normal  BUN 6, Cr 0.77  quantiferon gold negative

## 2017-12-28 DIAGNOSIS — K5010 Crohn's disease of large intestine without complications: Secondary | ICD-10-CM | POA: Diagnosis not present

## 2018-02-23 DIAGNOSIS — K5010 Crohn's disease of large intestine without complications: Secondary | ICD-10-CM | POA: Diagnosis not present

## 2018-03-14 DIAGNOSIS — G2581 Restless legs syndrome: Secondary | ICD-10-CM | POA: Diagnosis not present

## 2018-03-14 DIAGNOSIS — Z0000 Encounter for general adult medical examination without abnormal findings: Secondary | ICD-10-CM | POA: Diagnosis not present

## 2018-03-14 DIAGNOSIS — K5010 Crohn's disease of large intestine without complications: Secondary | ICD-10-CM | POA: Diagnosis not present

## 2018-03-14 DIAGNOSIS — N529 Male erectile dysfunction, unspecified: Secondary | ICD-10-CM | POA: Diagnosis not present

## 2018-04-20 DIAGNOSIS — K5010 Crohn's disease of large intestine without complications: Secondary | ICD-10-CM | POA: Diagnosis not present

## 2018-06-21 DIAGNOSIS — K5010 Crohn's disease of large intestine without complications: Secondary | ICD-10-CM | POA: Diagnosis not present

## 2018-06-27 ENCOUNTER — Encounter: Payer: Self-pay | Admitting: Gastroenterology

## 2018-08-11 DIAGNOSIS — D2261 Melanocytic nevi of right upper limb, including shoulder: Secondary | ICD-10-CM | POA: Diagnosis not present

## 2018-08-11 DIAGNOSIS — L821 Other seborrheic keratosis: Secondary | ICD-10-CM | POA: Diagnosis not present

## 2018-08-11 DIAGNOSIS — D485 Neoplasm of uncertain behavior of skin: Secondary | ICD-10-CM | POA: Diagnosis not present

## 2018-08-11 DIAGNOSIS — D225 Melanocytic nevi of trunk: Secondary | ICD-10-CM | POA: Diagnosis not present

## 2018-08-24 DIAGNOSIS — K5010 Crohn's disease of large intestine without complications: Secondary | ICD-10-CM | POA: Diagnosis not present

## 2018-10-03 DIAGNOSIS — R220 Localized swelling, mass and lump, head: Secondary | ICD-10-CM | POA: Diagnosis not present

## 2018-10-03 DIAGNOSIS — Z3009 Encounter for other general counseling and advice on contraception: Secondary | ICD-10-CM | POA: Diagnosis not present

## 2018-10-19 DIAGNOSIS — K5010 Crohn's disease of large intestine without complications: Secondary | ICD-10-CM | POA: Diagnosis not present

## 2018-12-14 DIAGNOSIS — K5010 Crohn's disease of large intestine without complications: Secondary | ICD-10-CM | POA: Diagnosis not present

## 2019-01-17 DIAGNOSIS — Z03818 Encounter for observation for suspected exposure to other biological agents ruled out: Secondary | ICD-10-CM | POA: Diagnosis not present

## 2019-02-08 DIAGNOSIS — K5010 Crohn's disease of large intestine without complications: Secondary | ICD-10-CM | POA: Diagnosis not present

## 2019-04-10 DIAGNOSIS — K5010 Crohn's disease of large intestine without complications: Secondary | ICD-10-CM | POA: Diagnosis not present

## 2019-06-05 DIAGNOSIS — K5010 Crohn's disease of large intestine without complications: Secondary | ICD-10-CM | POA: Diagnosis not present

## 2019-06-12 ENCOUNTER — Telehealth: Payer: Self-pay

## 2019-06-12 DIAGNOSIS — K5010 Crohn's disease of large intestine without complications: Secondary | ICD-10-CM | POA: Diagnosis not present

## 2019-06-12 DIAGNOSIS — K5100 Ulcerative (chronic) pancolitis without complications: Secondary | ICD-10-CM | POA: Diagnosis not present

## 2019-06-12 NOTE — Telephone Encounter (Signed)
Left message for patient to call back to schedule an OV with Dr. Havery Moros.  He is overdue. Last seen in 10-2017 (Crohns)

## 2019-06-12 NOTE — Telephone Encounter (Signed)
-----   Message from Yetta Flock, MD sent at 06/12/2019  2:39 PM EDT ----- Regarding: outpatient follow up This patient is due for a routine office visit, if you can refer to the schedulers. Thanks

## 2019-06-14 NOTE — Telephone Encounter (Signed)
Left message for patient to call back  

## 2019-06-18 NOTE — Telephone Encounter (Signed)
Mailed letter to pt to call and schedule an OV with Dr. Havery Moros for Crohns

## 2019-06-18 NOTE — Progress Notes (Signed)
Letter mailed to pt to call and schedule and OV with [facility]

## 2019-06-20 DIAGNOSIS — Z125 Encounter for screening for malignant neoplasm of prostate: Secondary | ICD-10-CM | POA: Diagnosis not present

## 2019-06-20 DIAGNOSIS — Z0000 Encounter for general adult medical examination without abnormal findings: Secondary | ICD-10-CM | POA: Diagnosis not present

## 2019-06-20 DIAGNOSIS — F17200 Nicotine dependence, unspecified, uncomplicated: Secondary | ICD-10-CM | POA: Diagnosis not present

## 2019-06-20 DIAGNOSIS — Z13220 Encounter for screening for lipoid disorders: Secondary | ICD-10-CM | POA: Diagnosis not present

## 2019-06-20 DIAGNOSIS — G2581 Restless legs syndrome: Secondary | ICD-10-CM | POA: Diagnosis not present

## 2019-06-20 DIAGNOSIS — Z1159 Encounter for screening for other viral diseases: Secondary | ICD-10-CM | POA: Diagnosis not present

## 2019-06-20 DIAGNOSIS — F411 Generalized anxiety disorder: Secondary | ICD-10-CM | POA: Diagnosis not present

## 2019-06-20 DIAGNOSIS — G4733 Obstructive sleep apnea (adult) (pediatric): Secondary | ICD-10-CM | POA: Diagnosis not present

## 2019-07-19 ENCOUNTER — Ambulatory Visit: Payer: BC Managed Care – PPO | Admitting: Gastroenterology

## 2019-07-19 ENCOUNTER — Telehealth: Payer: Self-pay

## 2019-07-19 ENCOUNTER — Encounter: Payer: Self-pay | Admitting: Gastroenterology

## 2019-07-19 VITALS — BP 120/70 | HR 112 | Ht 71.0 in | Wt 206.2 lb

## 2019-07-19 DIAGNOSIS — Z79899 Other long term (current) drug therapy: Secondary | ICD-10-CM

## 2019-07-19 DIAGNOSIS — Z23 Encounter for immunization: Secondary | ICD-10-CM

## 2019-07-19 DIAGNOSIS — K50119 Crohn's disease of large intestine with unspecified complications: Secondary | ICD-10-CM | POA: Diagnosis not present

## 2019-07-19 MED ORDER — SUTAB 1479-225-188 MG PO TABS: 1.0000 | ORAL_TABLET | Freq: Once | ORAL | 0 refills | Status: AC

## 2019-07-19 NOTE — Telephone Encounter (Signed)
Requested labs from Dr. Delene Ruffini office: [phone]
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
## 2019-07-19 NOTE — Telephone Encounter (Signed)
Labs requested from [facility].

## 2019-07-19 NOTE — Patient Instructions (Addendum)
If you are age 48 or older, your body mass index should be between 23-30. Your Body mass index is 28.76 kg/m. If this is out of the aforementioned range listed, please consider follow up with your Primary Care Provider.  If you are age 54 or younger, your body mass index should be between 19-25. Your Body mass index is 28.76 kg/m. If this is out of the aformentioned range listed, please consider follow up with your Primary Care Provider.   You have been scheduled for a colonoscopy. Please follow written instructions given to you at your visit today.  Please pick up your prep supplies at the pharmacy within the next 1-3 days. If you use inhalers (even only as needed), please bring them with you on the day of your procedure.  We will request your recent labs from Dr. Delene Ruffini office.  We gave you the Prevnar 13 vaccine today with the vaccine information sheet.  Thank you for entrusting me with your care and for choosing [facility], Dr. [place] Cellar

## 2019-07-24 ENCOUNTER — Telehealth: Payer: Self-pay | Admitting: Gastroenterology

## 2019-07-24 NOTE — Telephone Encounter (Signed)
Labs outlined as below:  04/10/19 WBC 7.1, Hgb 14.3, Plt 217, MCV 90  [place] LFTs and renal function Will continue to have routine labs done with Dr. Trudie Reed.

## 2019-07-24 NOTE — Telephone Encounter (Signed)
rec'd and placed on Dr. Doyne Keel desk for review

## 2019-07-31 DIAGNOSIS — K5010 Crohn's disease of large intestine without complications: Secondary | ICD-10-CM | POA: Diagnosis not present

## 2019-08-22 DIAGNOSIS — R220 Localized swelling, mass and lump, head: Secondary | ICD-10-CM | POA: Diagnosis not present

## 2019-09-17 ENCOUNTER — Encounter: Payer: Self-pay | Admitting: Gastroenterology

## 2019-09-25 DIAGNOSIS — K5010 Crohn's disease of large intestine without complications: Secondary | ICD-10-CM | POA: Diagnosis not present

## 2019-09-28 ENCOUNTER — Encounter: Payer: Self-pay | Admitting: Gastroenterology

## 2019-09-28 ENCOUNTER — Ambulatory Visit (AMBULATORY_SURGERY_CENTER): Payer: BC Managed Care – PPO | Admitting: Gastroenterology

## 2019-09-28 ENCOUNTER — Other Ambulatory Visit: Payer: Self-pay

## 2019-09-28 VITALS — BP 116/74 | HR 80 | Temp 98.1°F | Resp 20 | Ht 71.0 in | Wt 206.0 lb

## 2019-09-28 DIAGNOSIS — D120 Benign neoplasm of cecum: Secondary | ICD-10-CM

## 2019-09-28 DIAGNOSIS — K5140 Inflammatory polyps of colon without complications: Secondary | ICD-10-CM | POA: Diagnosis not present

## 2019-09-28 DIAGNOSIS — K50119 Crohn's disease of large intestine with unspecified complications: Secondary | ICD-10-CM | POA: Diagnosis not present

## 2019-09-28 DIAGNOSIS — K5090 Crohn's disease, unspecified, without complications: Secondary | ICD-10-CM | POA: Diagnosis not present

## 2019-09-28 DIAGNOSIS — K529 Noninfective gastroenteritis and colitis, unspecified: Secondary | ICD-10-CM | POA: Diagnosis not present

## 2019-09-28 DIAGNOSIS — D122 Benign neoplasm of ascending colon: Secondary | ICD-10-CM

## 2019-09-28 DIAGNOSIS — D123 Benign neoplasm of transverse colon: Secondary | ICD-10-CM

## 2019-09-28 MED ORDER — SODIUM CHLORIDE 0.9 % IV SOLN: 500.0000 mL | Freq: Once | INTRAVENOUS | Status: DC

## 2019-09-28 NOTE — Op Note (Signed)
[place] Patient Name: [name] Procedure Date: 09/28/2019 12:59 PM MRN: [id number] Endoscopist: Remo Lipps P. Havery Moros , MD Age: 49 Referring MD:  Date of Birth: 1970-12-12 Gender: Male Account #: [id number] Procedure:                Colonoscopy Indications:              High risk colon cancer surveillance: Crohn's                            colitis - controlled on Remicade, history of colon                            polyps Medicines:                Monitored Anesthesia Care Procedure:                Pre-Anesthesia Assessment:                           - Prior to the procedure, a History and Physical                            was performed, and patient medications and                            allergies were reviewed. The patient's tolerance of                            previous anesthesia was also reviewed. The risks                            and benefits of the procedure and the sedation                            options and risks were discussed with the patient.                            All questions were answered, and informed consent                            was obtained. Prior Anticoagulants: The patient has                            taken no previous anticoagulant or antiplatelet                            agents. ASA Grade Assessment: II - A patient with                            mild systemic disease. After reviewing the risks                            and benefits, the patient was deemed in  satisfactory condition to undergo the procedure.                           After obtaining informed consent, the colonoscope                            was passed under direct vision. Throughout the                            procedure, the patient's blood pressure, pulse, and                            oxygen saturations were monitored continuously. The                            Colonoscope was introduced through the anus and                             advanced to the the terminal ileum, with                            identification of the appendiceal orifice and IC                            valve. The colonoscopy was performed without                            difficulty. The patient tolerated the procedure                            well. The quality of the bowel preparation was                            adequate. The terminal ileum, ileocecal valve,                            appendiceal orifice, and rectum were photographed. Scope In: 1:13:32 PM Scope Out: 1:33:21 PM Scope Withdrawal Time: 0 hours 18 minutes 34 seconds  Total Procedure Duration: 0 hours 19 minutes 49 seconds  Findings:                 The perianal and digital rectal examinations were                            normal.                           The terminal ileum appeared normal.                           Two sessile polyps were found in the cecum. The                            polyps were 3 to 7 mm in size. These polyps were  removed with a cold snare. Resection and retrieval                            were complete.                           Two flat polyps were found in the transverse colon.                            The polyps were 10-12 mm in size. These polyps were                            removed with a cold snare. Resection and retrieval                            were complete.                           Multiple medium-mouthed diverticula were found in                            the sigmoid colon and ascending colon.                           A few scattered pseudopolyps were found in the                            sigmoid colon. Biopsies were taken with a cold                            forceps for histology.                           Anal papilla(e) were hypertrophied.                           The exam was otherwise without abnormality. No                            active inflammation                            Biopsies were taken with a cold forceps for                            histology from the right, left, and transverse                            colon for surveillance. Complications:            No immediate complications. Estimated blood loss:                            Minimal. Estimated Blood Loss:     Estimated blood loss was minimal. Impression:               -  The examined portion of the ileum was normal.                           - Two 3 to 7 mm polyps in the cecum, removed with a                            cold snare. Resected and retrieved.                           - Two 10-12 mm polyps in the transverse colon,                            removed with a cold snare. Resected and retrieved.                           - Diverticulosis in the sigmoid colon and in the                            ascending colon.                           - Pseudopolyps in the sigmoid colon. Biopsied.                           - Anal papilla(e) were hypertrophied.                           - The examination was otherwise normal.                           - Biopsies were taken with a cold forceps for                            histology. Recommendation:           - Patient has a contact number available for                            emergencies. The signs and symptoms of potential                            delayed complications were discussed with the                            patient. Return to normal activities tomorrow.                            Written discharge instructions were provided to the                            patient.                           - Resume previous diet.                           -  Continue present medications.                           - Await pathology results. Remo Lipps P. Alexianna Nachreiner, MD 09/28/2019 1:41:57 PM This report has been signed electronically.

## 2019-09-28 NOTE — Progress Notes (Signed)
Called to room to assist during endoscopic procedure.  Patient ID and intended procedure confirmed with present staff. Received instructions for my participation in the procedure from the performing physician.  

## 2019-09-28 NOTE — Patient Instructions (Signed)
YOU HAD AN ENDOSCOPIC PROCEDURE TODAY AT THE [facility]:   Refer to the procedure report that was given to you for any specific questions about what was found during the examination.  If the procedure report does not answer your questions, please call your gastroenterologist to clarify.  If you requested that your care partner not be given the details of your procedure findings, then the procedure report has been included in a sealed envelope for you to review at your convenience later.  YOU SHOULD EXPECT: Some feelings of bloating in the abdomen. Passage of more gas than usual.  Walking can help get rid of the air that was put into your GI tract during the procedure and reduce the bloating. If you had a lower endoscopy (such as a colonoscopy or flexible sigmoidoscopy) you may notice spotting of blood in your stool or on the toilet paper. If you underwent a bowel prep for your procedure, you may not have a normal bowel movement for a few days.  Please Note:  You might notice some irritation and congestion in your nose or some drainage.  This is from the oxygen used during your procedure.  There is no need for concern and it should clear up in a day or so.  SYMPTOMS TO REPORT IMMEDIATELY:   Following lower endoscopy (colonoscopy or flexible sigmoidoscopy):  Excessive amounts of blood in the stool  Significant tenderness or worsening of abdominal pains  Swelling of the abdomen that is new, acute  Fever of 100F or higher  For urgent or emergent issues, a gastroenterologist can be reached at any hour by calling [phone]. Do not use MyChart messaging for urgent concerns.    DIET:  We do recommend a small meal at first, but then you may proceed to your regular diet.  Drink plenty of fluids but you should avoid alcoholic beverages for 24 hours.  ACTIVITY:  You should plan to take it easy for the rest of today and you should NOT DRIVE or use heavy machinery until tomorrow (because  of the sedation medicines used during the test).    FOLLOW UP: Our staff will call the number listed on your records 48-72 hours following your procedure to check on you and address any questions or concerns that you may have regarding the information given to you following your procedure. If we do not reach you, we will leave a message.  We will attempt to reach you two times.  During this call, we will ask if you have developed any symptoms of COVID 19. If you develop any symptoms (ie: fever, flu-like symptoms, shortness of breath, cough etc.) before then, please call [phone].  If you test positive for Covid 19 in the 2 weeks post procedure, please call and report this information to us.    If any biopsies were taken you will be contacted by phone or by letter within the next 1-3 weeks.  Please call us at [phone] if you have not heard about the biopsies in 3 weeks.    SIGNATURES/CONFIDENTIALITY: You and/or your care partner have signed paperwork which will be entered into your electronic medical record.  These signatures attest to the fact that that the information above on your After Visit Summary has been reviewed and is understood.  Full responsibility of the confidentiality of this discharge information lies with you and/or your care-partner. 

## 2019-09-28 NOTE — Progress Notes (Signed)
PT taken to PACU. Monitors in place. VSS. Report given to RN. 

## 2019-10-01 DIAGNOSIS — Z20822 Contact with and (suspected) exposure to covid-19: Secondary | ICD-10-CM | POA: Diagnosis not present

## 2019-10-01 DIAGNOSIS — Z03818 Encounter for observation for suspected exposure to other biological agents ruled out: Secondary | ICD-10-CM | POA: Diagnosis not present

## 2019-10-02 ENCOUNTER — Telehealth: Payer: Self-pay

## 2019-10-02 NOTE — Telephone Encounter (Signed)
  Follow up Call-  Call back number 09/28/2019  Post procedure Call Back phone  # [phone]  Permission to leave phone message Yes  Some recent data might be hidden     Patient questions:  Do you have a fever, pain , or abdominal swelling? No. Pain Score  0 *  Have you tolerated food without any problems? Yes.    Have you been able to return to your normal activities? Yes.    Do you have any questions about your discharge instructions: Diet   No. Medications  No. Follow up visit  No.  Do you have questions or concerns about your Care? No.  Actions: * If pain score is 4 or above: No action needed, pain <4.  1. Have you developed a fever since your procedure? No  2.   Have you had an respiratory symptoms (SOB or cough) since your procedure? No  3.   Have you tested positive for COVID 19 since your procedure No  4.   Have you had any family members/close contacts diagnosed with the COVID 19 since your procedure?  No   If yes to any of these questions please route to Joylene John, RN and Erenest Rasher, RN

## 2019-10-04 ENCOUNTER — Encounter: Payer: Self-pay | Admitting: Gastroenterology

## 2019-10-08 DIAGNOSIS — Z01818 Encounter for other preprocedural examination: Secondary | ICD-10-CM | POA: Diagnosis not present

## 2019-10-10 DIAGNOSIS — D2272 Melanocytic nevi of left lower limb, including hip: Secondary | ICD-10-CM | POA: Diagnosis not present

## 2019-10-10 DIAGNOSIS — D2262 Melanocytic nevi of left upper limb, including shoulder: Secondary | ICD-10-CM | POA: Diagnosis not present

## 2019-10-10 DIAGNOSIS — D225 Melanocytic nevi of trunk: Secondary | ICD-10-CM | POA: Diagnosis not present

## 2019-10-10 DIAGNOSIS — D2261 Melanocytic nevi of right upper limb, including shoulder: Secondary | ICD-10-CM | POA: Diagnosis not present

## 2019-10-12 ENCOUNTER — Other Ambulatory Visit: Payer: Self-pay | Admitting: Otolaryngology

## 2019-10-12 DIAGNOSIS — D491 Neoplasm of unspecified behavior of respiratory system: Secondary | ICD-10-CM | POA: Diagnosis not present

## 2019-10-12 DIAGNOSIS — D1039 Benign neoplasm of other parts of mouth: Secondary | ICD-10-CM | POA: Diagnosis not present

## 2019-10-27 ENCOUNTER — Other Ambulatory Visit: Payer: Self-pay

## 2019-10-27 ENCOUNTER — Emergency Department (HOSPITAL_COMMUNITY): Payer: BC Managed Care – PPO

## 2019-10-27 ENCOUNTER — Encounter (HOSPITAL_COMMUNITY): Payer: Self-pay | Admitting: Internal Medicine

## 2019-10-27 ENCOUNTER — Inpatient Hospital Stay (HOSPITAL_COMMUNITY)
Admission: EM | Admit: 2019-10-27 | Discharge: 2019-11-09 | DRG: 673 | Disposition: A | Discharge status: Rehabilitation Facility | Payer: BC Managed Care – PPO | Attending: Family Medicine | Admitting: Family Medicine

## 2019-10-27 DIAGNOSIS — E872 Acidosis, unspecified: Secondary | ICD-10-CM

## 2019-10-27 DIAGNOSIS — Z7289 Other problems related to lifestyle: Secondary | ICD-10-CM

## 2019-10-27 DIAGNOSIS — E875 Hyperkalemia: Secondary | ICD-10-CM | POA: Diagnosis not present

## 2019-10-27 DIAGNOSIS — I63111 Cerebral infarction due to embolism of right vertebral artery: Secondary | ICD-10-CM | POA: Diagnosis not present

## 2019-10-27 DIAGNOSIS — Z789 Other specified health status: Secondary | ICD-10-CM

## 2019-10-27 DIAGNOSIS — F1010 Alcohol abuse, uncomplicated: Secondary | ICD-10-CM | POA: Diagnosis present

## 2019-10-27 DIAGNOSIS — F1410 Cocaine abuse, uncomplicated: Secondary | ICD-10-CM | POA: Diagnosis present

## 2019-10-27 DIAGNOSIS — D649 Anemia, unspecified: Secondary | ICD-10-CM | POA: Diagnosis not present

## 2019-10-27 DIAGNOSIS — K5901 Slow transit constipation: Secondary | ICD-10-CM | POA: Diagnosis not present

## 2019-10-27 DIAGNOSIS — K5090 Crohn's disease, unspecified, without complications: Secondary | ICD-10-CM | POA: Diagnosis not present

## 2019-10-27 DIAGNOSIS — N179 Acute kidney failure, unspecified: Secondary | ICD-10-CM

## 2019-10-27 DIAGNOSIS — T405X1D Poisoning by cocaine, accidental (unintentional), subsequent encounter: Secondary | ICD-10-CM | POA: Diagnosis not present

## 2019-10-27 DIAGNOSIS — D72829 Elevated white blood cell count, unspecified: Secondary | ICD-10-CM | POA: Diagnosis present

## 2019-10-27 DIAGNOSIS — G931 Anoxic brain damage, not elsewhere classified: Secondary | ICD-10-CM | POA: Diagnosis not present

## 2019-10-27 DIAGNOSIS — Z043 Encounter for examination and observation following other accident: Secondary | ICD-10-CM | POA: Diagnosis not present

## 2019-10-27 DIAGNOSIS — E785 Hyperlipidemia, unspecified: Secondary | ICD-10-CM | POA: Diagnosis present

## 2019-10-27 DIAGNOSIS — S0993XA Unspecified injury of face, initial encounter: Secondary | ICD-10-CM | POA: Diagnosis not present

## 2019-10-27 DIAGNOSIS — K429 Umbilical hernia without obstruction or gangrene: Secondary | ICD-10-CM | POA: Diagnosis not present

## 2019-10-27 DIAGNOSIS — I639 Cerebral infarction, unspecified: Secondary | ICD-10-CM | POA: Diagnosis present

## 2019-10-27 DIAGNOSIS — G588 Other specified mononeuropathies: Secondary | ICD-10-CM | POA: Diagnosis not present

## 2019-10-27 DIAGNOSIS — S99911A Unspecified injury of right ankle, initial encounter: Secondary | ICD-10-CM | POA: Diagnosis not present

## 2019-10-27 DIAGNOSIS — T796XXS Traumatic ischemia of muscle, sequela: Secondary | ICD-10-CM | POA: Diagnosis not present

## 2019-10-27 DIAGNOSIS — Z86711 Personal history of pulmonary embolism: Secondary | ICD-10-CM

## 2019-10-27 DIAGNOSIS — M6281 Muscle weakness (generalized): Secondary | ICD-10-CM | POA: Diagnosis not present

## 2019-10-27 DIAGNOSIS — N170 Acute kidney failure with tubular necrosis: Principal | ICD-10-CM | POA: Diagnosis present

## 2019-10-27 DIAGNOSIS — M7989 Other specified soft tissue disorders: Secondary | ICD-10-CM | POA: Diagnosis not present

## 2019-10-27 DIAGNOSIS — R531 Weakness: Secondary | ICD-10-CM | POA: Diagnosis not present

## 2019-10-27 DIAGNOSIS — M6282 Rhabdomyolysis: Secondary | ICD-10-CM | POA: Diagnosis not present

## 2019-10-27 DIAGNOSIS — I69351 Hemiplegia and hemiparesis following cerebral infarction affecting right dominant side: Secondary | ICD-10-CM | POA: Diagnosis not present

## 2019-10-27 DIAGNOSIS — G8191 Hemiplegia, unspecified affecting right dominant side: Secondary | ICD-10-CM | POA: Diagnosis not present

## 2019-10-27 DIAGNOSIS — E861 Hypovolemia: Secondary | ICD-10-CM | POA: Diagnosis not present

## 2019-10-27 DIAGNOSIS — K5900 Constipation, unspecified: Secondary | ICD-10-CM | POA: Diagnosis present

## 2019-10-27 DIAGNOSIS — F1910 Other psychoactive substance abuse, uncomplicated: Secondary | ICD-10-CM | POA: Diagnosis not present

## 2019-10-27 DIAGNOSIS — R945 Abnormal results of liver function studies: Secondary | ICD-10-CM | POA: Diagnosis not present

## 2019-10-27 DIAGNOSIS — M47812 Spondylosis without myelopathy or radiculopathy, cervical region: Secondary | ICD-10-CM | POA: Diagnosis not present

## 2019-10-27 DIAGNOSIS — E8729 Other acidosis: Secondary | ICD-10-CM | POA: Diagnosis present

## 2019-10-27 DIAGNOSIS — Z23 Encounter for immunization: Secondary | ICD-10-CM | POA: Diagnosis not present

## 2019-10-27 DIAGNOSIS — Z20822 Contact with and (suspected) exposure to covid-19: Secondary | ICD-10-CM | POA: Diagnosis present

## 2019-10-27 DIAGNOSIS — F1990 Other psychoactive substance use, unspecified, uncomplicated: Secondary | ICD-10-CM | POA: Diagnosis not present

## 2019-10-27 DIAGNOSIS — M47897 Other spondylosis, lumbosacral region: Secondary | ICD-10-CM | POA: Diagnosis not present

## 2019-10-27 DIAGNOSIS — F10129 Alcohol abuse with intoxication, unspecified: Secondary | ICD-10-CM | POA: Diagnosis not present

## 2019-10-27 DIAGNOSIS — Z992 Dependence on renal dialysis: Secondary | ICD-10-CM | POA: Diagnosis not present

## 2019-10-27 DIAGNOSIS — M47817 Spondylosis without myelopathy or radiculopathy, lumbosacral region: Secondary | ICD-10-CM | POA: Diagnosis not present

## 2019-10-27 DIAGNOSIS — S0990XA Unspecified injury of head, initial encounter: Secondary | ICD-10-CM | POA: Diagnosis not present

## 2019-10-27 DIAGNOSIS — R7401 Elevation of levels of liver transaminase levels: Secondary | ICD-10-CM | POA: Diagnosis present

## 2019-10-27 DIAGNOSIS — G5731 Lesion of lateral popliteal nerve, right lower limb: Secondary | ICD-10-CM | POA: Diagnosis not present

## 2019-10-27 DIAGNOSIS — F17210 Nicotine dependence, cigarettes, uncomplicated: Secondary | ICD-10-CM | POA: Diagnosis present

## 2019-10-27 DIAGNOSIS — Z4901 Encounter for fitting and adjustment of extracorporeal dialysis catheter: Secondary | ICD-10-CM | POA: Diagnosis not present

## 2019-10-27 DIAGNOSIS — I959 Hypotension, unspecified: Secondary | ICD-10-CM | POA: Diagnosis present

## 2019-10-27 DIAGNOSIS — M4802 Spinal stenosis, cervical region: Secondary | ICD-10-CM | POA: Diagnosis not present

## 2019-10-27 DIAGNOSIS — T405X1A Poisoning by cocaine, accidental (unintentional), initial encounter: Secondary | ICD-10-CM | POA: Diagnosis not present

## 2019-10-27 DIAGNOSIS — M5021 Other cervical disc displacement,  high cervical region: Secondary | ICD-10-CM | POA: Diagnosis not present

## 2019-10-27 DIAGNOSIS — I69319 Unspecified symptoms and signs involving cognitive functions following cerebral infarction: Secondary | ICD-10-CM | POA: Diagnosis not present

## 2019-10-27 DIAGNOSIS — G259 Extrapyramidal and movement disorder, unspecified: Secondary | ICD-10-CM | POA: Diagnosis not present

## 2019-10-27 DIAGNOSIS — W19XXXA Unspecified fall, initial encounter: Secondary | ICD-10-CM

## 2019-10-27 DIAGNOSIS — I6389 Other cerebral infarction: Secondary | ICD-10-CM | POA: Diagnosis not present

## 2019-10-27 DIAGNOSIS — M47816 Spondylosis without myelopathy or radiculopathy, lumbar region: Secondary | ICD-10-CM | POA: Diagnosis not present

## 2019-10-27 DIAGNOSIS — M50221 Other cervical disc displacement at C4-C5 level: Secondary | ICD-10-CM | POA: Diagnosis not present

## 2019-10-27 DIAGNOSIS — S199XXA Unspecified injury of neck, initial encounter: Secondary | ICD-10-CM | POA: Diagnosis not present

## 2019-10-27 DIAGNOSIS — N186 End stage renal disease: Secondary | ICD-10-CM | POA: Diagnosis not present

## 2019-10-27 DIAGNOSIS — R000 Tachycardia, unspecified: Secondary | ICD-10-CM | POA: Diagnosis not present

## 2019-10-27 DIAGNOSIS — M79604 Pain in right leg: Secondary | ICD-10-CM | POA: Diagnosis not present

## 2019-10-27 DIAGNOSIS — F1090 Alcohol use, unspecified, uncomplicated: Secondary | ICD-10-CM

## 2019-10-27 DIAGNOSIS — R609 Edema, unspecified: Secondary | ICD-10-CM | POA: Diagnosis not present

## 2019-10-27 DIAGNOSIS — T796XXD Traumatic ischemia of muscle, subsequent encounter: Secondary | ICD-10-CM | POA: Diagnosis not present

## 2019-10-27 DIAGNOSIS — M25571 Pain in right ankle and joints of right foot: Secondary | ICD-10-CM | POA: Diagnosis not present

## 2019-10-27 DIAGNOSIS — E871 Hypo-osmolality and hyponatremia: Secondary | ICD-10-CM | POA: Diagnosis not present

## 2019-10-27 LAB — CBC WITH DIFFERENTIAL/PLATELET
Abs Immature Granulocytes: 2.05 10*3/uL — ABNORMAL HIGH (ref 0.00–0.07)
Basophils Absolute: 0.2 10*3/uL — ABNORMAL HIGH (ref 0.0–0.1)
Basophils Relative: 0 %
Eosinophils Absolute: 0 10*3/uL (ref 0.0–0.5)
Eosinophils Relative: 0 %
HCT: 56.4 % — ABNORMAL HIGH (ref 39.0–52.0)
Hemoglobin: 19.3 g/dL — ABNORMAL HIGH (ref 13.0–17.0)
Immature Granulocytes: 5 %
Lymphocytes Relative: 5 %
Lymphs Abs: 2 10*3/uL (ref 0.7–4.0)
MCH: 31.3 pg (ref 26.0–34.0)
MCHC: 34.2 g/dL (ref 30.0–36.0)
MCV: 91.6 fL (ref 80.0–100.0)
Monocytes Absolute: 1.9 10*3/uL — ABNORMAL HIGH (ref 0.1–1.0)
Monocytes Relative: 4 %
Neutro Abs: 37.4 10*3/uL — ABNORMAL HIGH (ref 1.7–7.7)
Neutrophils Relative %: 86 %
Platelets: 276 10*3/uL (ref 150–400)
RBC: 6.16 MIL/uL — ABNORMAL HIGH (ref 4.22–5.81)
RDW: 13.2 % (ref 11.5–15.5)
WBC: 43.5 10*3/uL — ABNORMAL HIGH (ref 4.0–10.5)
nRBC: 0.2 % (ref 0.0–0.2)

## 2019-10-27 LAB — COMPREHENSIVE METABOLIC PANEL
ALT: 1851 U/L — ABNORMAL HIGH (ref 0–44)
AST: 4337 U/L — ABNORMAL HIGH (ref 15–41)
Albumin: 3.9 g/dL (ref 3.5–5.0)
Alkaline Phosphatase: 83 U/L (ref 38–126)
Anion gap: 24 — ABNORMAL HIGH (ref 5–15)
BUN: 31 mg/dL — ABNORMAL HIGH (ref 6–20)
CO2: 14 mmol/L — ABNORMAL LOW (ref 22–32)
Calcium: 6.9 mg/dL — ABNORMAL LOW (ref 8.9–10.3)
Chloride: 109 mmol/L (ref 98–111)
Creatinine, Ser: 5.26 mg/dL — ABNORMAL HIGH (ref 0.61–1.24)
GFR calc Af Amer: 14 mL/min — ABNORMAL LOW (ref >60)
GFR calc non Af Amer: 12 mL/min — ABNORMAL LOW (ref >60)
Glucose, Bld: 96 mg/dL (ref 70–99)
Potassium: >7.5 mmol/L (ref 3.5–5.1)
Sodium: 147 mmol/L — ABNORMAL HIGH (ref 135–145)
Total Bilirubin: 0.7 mg/dL (ref 0.3–1.2)
Total Protein: 7.1 g/dL (ref 6.5–8.1)

## 2019-10-27 LAB — I-STAT CHEM 8, ED
BUN: 38 mg/dL — ABNORMAL HIGH (ref 6–20)
Calcium, Ion: 0.75 mmol/L — CL (ref 1.15–1.40)
Chloride: 114 mmol/L — ABNORMAL HIGH (ref 98–111)
Creatinine, Ser: 5.5 mg/dL — ABNORMAL HIGH (ref 0.61–1.24)
Glucose, Bld: 102 mg/dL — ABNORMAL HIGH (ref 70–99)
HCT: 40 % (ref 39.0–52.0)
Hemoglobin: 13.6 g/dL (ref 13.0–17.0)
Potassium: 7.5 mmol/L (ref 3.5–5.1)
Sodium: 140 mmol/L (ref 135–145)
TCO2: 14 mmol/L — ABNORMAL LOW (ref 22–32)

## 2019-10-27 LAB — URINALYSIS, ROUTINE W REFLEX MICROSCOPIC
Bilirubin Urine: NEGATIVE
Glucose, UA: NEGATIVE mg/dL
Ketones, ur: NEGATIVE mg/dL
Leukocytes,Ua: NEGATIVE
Nitrite: NEGATIVE
Protein, ur: NEGATIVE mg/dL
Specific Gravity, Urine: 1.003 — ABNORMAL LOW (ref 1.005–1.030)
pH: 6 (ref 5.0–8.0)

## 2019-10-27 LAB — I-STAT CREATININE, ED: Creatinine, Ser: 5.9 mg/dL — ABNORMAL HIGH (ref 0.61–1.24)

## 2019-10-27 LAB — RAPID URINE DRUG SCREEN, HOSP PERFORMED
Amphetamines: NOT DETECTED
Barbiturates: NOT DETECTED
Benzodiazepines: NOT DETECTED
Cocaine: POSITIVE — AB
Opiates: NOT DETECTED
Tetrahydrocannabinol: NOT DETECTED

## 2019-10-27 LAB — CARBOXYHEMOGLOBIN - COOX: Carboxyhemoglobin: 0.4 % — ABNORMAL LOW (ref 0.5–1.5)

## 2019-10-27 LAB — LACTIC ACID, PLASMA: Lactic Acid, Venous: 6.8 mmol/L (ref 0.5–1.9)

## 2019-10-27 LAB — SARS CORONAVIRUS 2 BY RT PCR (HOSPITAL ORDER, PERFORMED IN ~~LOC~~ HOSPITAL LAB): SARS Coronavirus 2: NEGATIVE

## 2019-10-27 LAB — CBG MONITORING, ED
Glucose-Capillary: 97 mg/dL (ref 70–99)
Glucose-Capillary: 174 mg/dL — ABNORMAL HIGH (ref 70–99)

## 2019-10-27 LAB — ETHANOL: Alcohol, Ethyl (B): 24 mg/dL — ABNORMAL HIGH (ref <10)

## 2019-10-27 IMAGING — CR DG CHEST 1V PORT
1 series · 1 of 1 positions shown · non-contrast
Comparison: Chest radiograph [DATE], no interval imaging
available.

CLINICAL DATA: Trauma.  Fall onto right side.

EXAM:
PORTABLE CHEST 1 VIEW

[x chest ap]
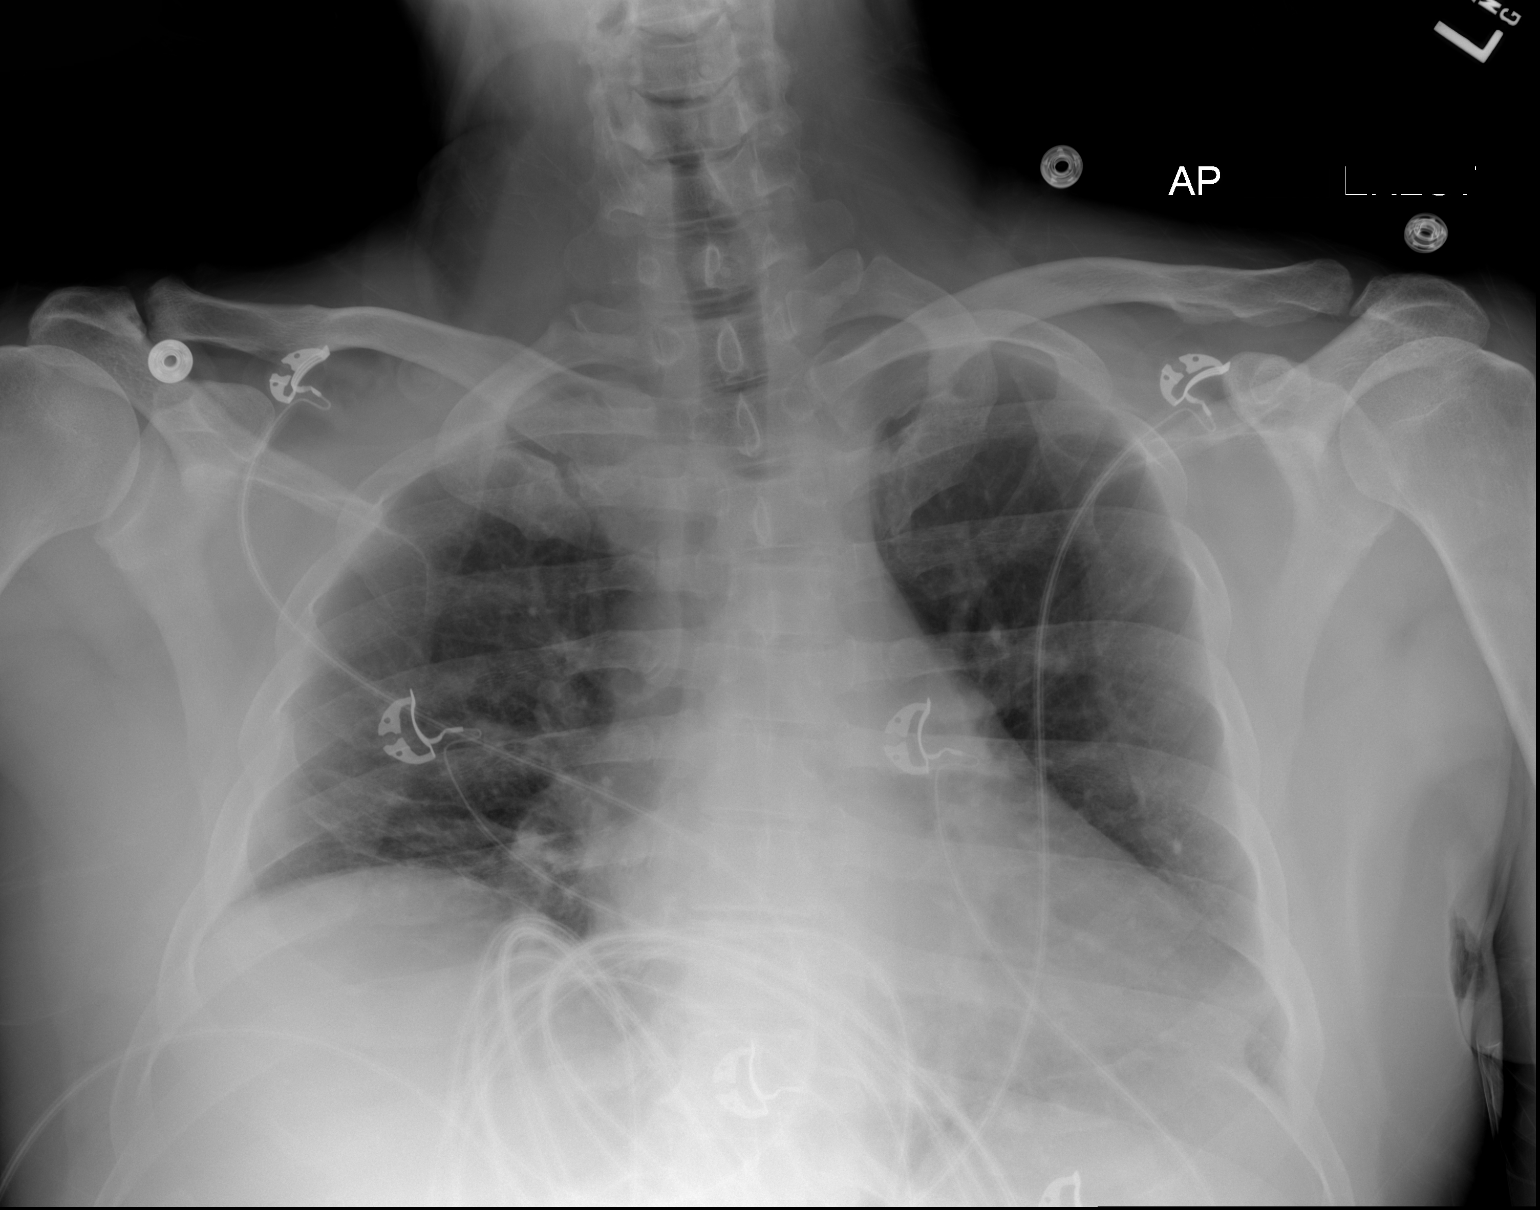

[1 of 1 positions shown; findings below may reference images not displayed]

FINDINGS: Lung volumes are low. Mild elevation of right hemidiaphragm. Upper
normal heart size likely accentuated by technique. No evidence of
pneumothorax. Streaky opacities at the right greater than left lung
base typical of atelectasis. No acute osseous abnormalities are
seen. Right anterior fourth rib fracture appears remote.
IMPRESSION: Low lung volumes with bibasilar atelectasis. No evidence of acute
traumatic injury.

## 2019-10-27 IMAGING — CT CT ABD-PELV W/O CM
2 of 4 series · 15 of 46 positions shown, 17 images · non-contrast
Comparison: None.

CLINICAL DATA: Abdominal distension and pain

EXAM:
CT ABDOMEN AND PELVIS WITHOUT CONTRAST
TECHNIQUE: Multidetector CT imaging of the abdomen and pelvis was performed
following the standard protocol without IV contrast.

[Series 2: axial st · axial · 0.87mm/px · z∈[-581,-86]mm · 12 of 113 slices shown, 14 images]
[im 7/113  soft-tissue]
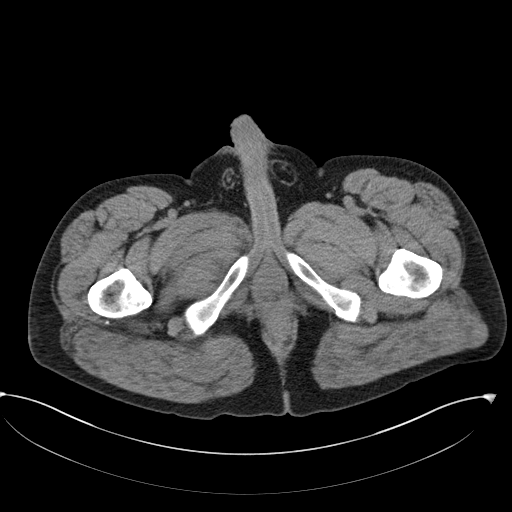
[im 7/113  bone]
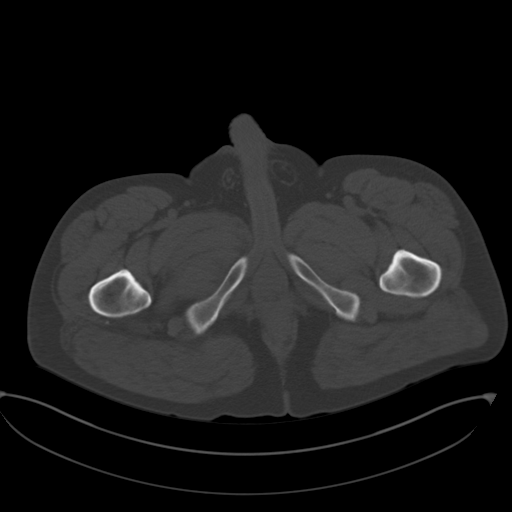
[im 19/113  soft-tissue]
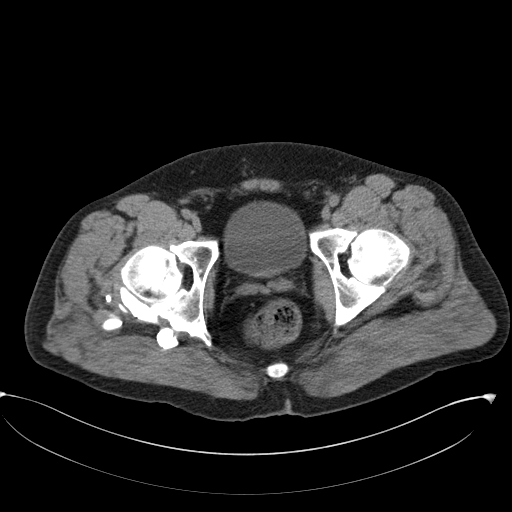
[im 25/113  soft-tissue]
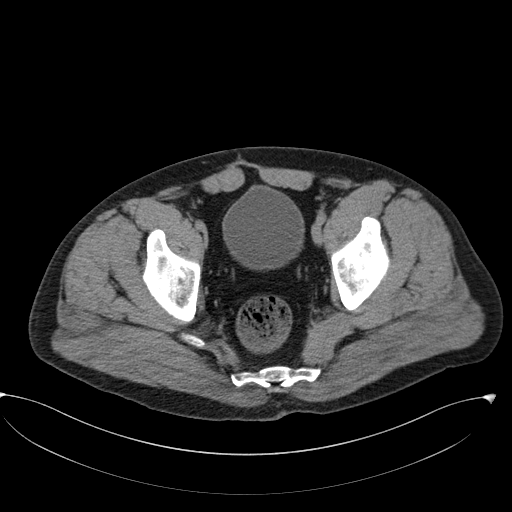
[im 32/113  soft-tissue]
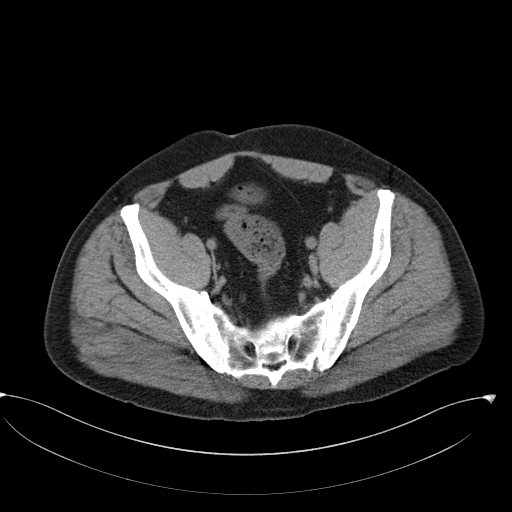
[im 44/113  soft-tissue]
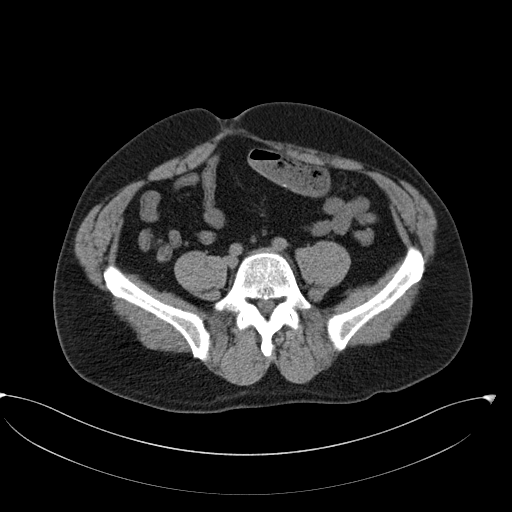
[im 50/113  soft-tissue]
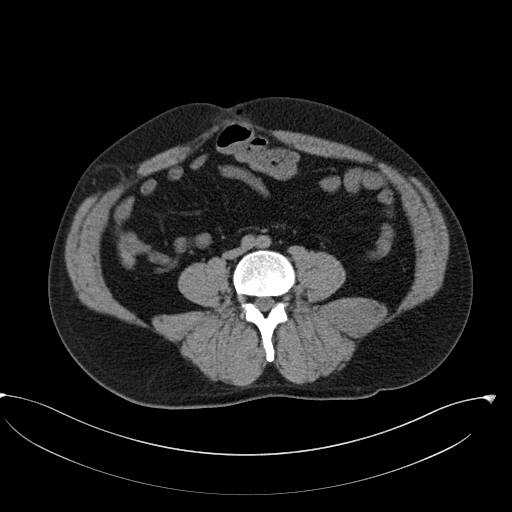
[im 63/113  soft-tissue]
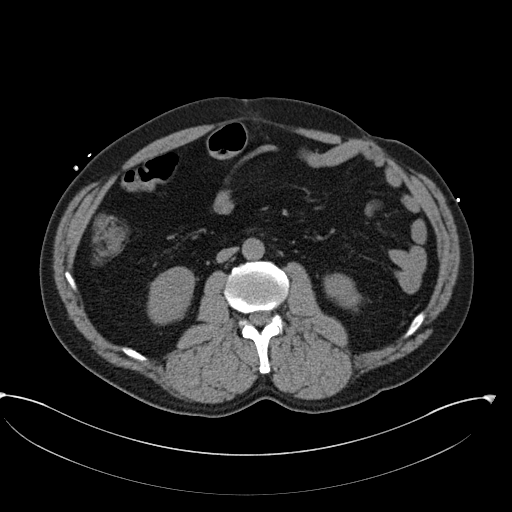
[im 69/113  soft-tissue]
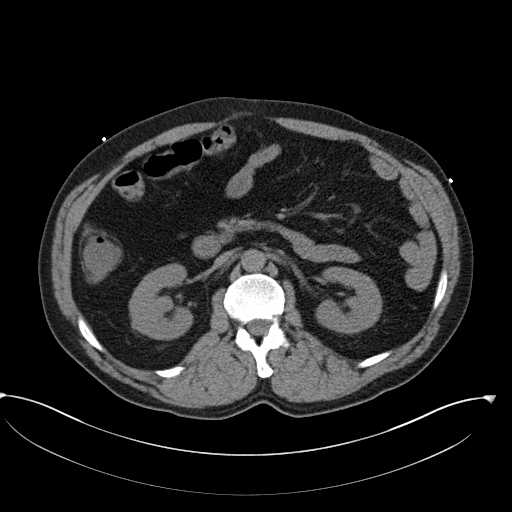
[im 81/113  soft-tissue]
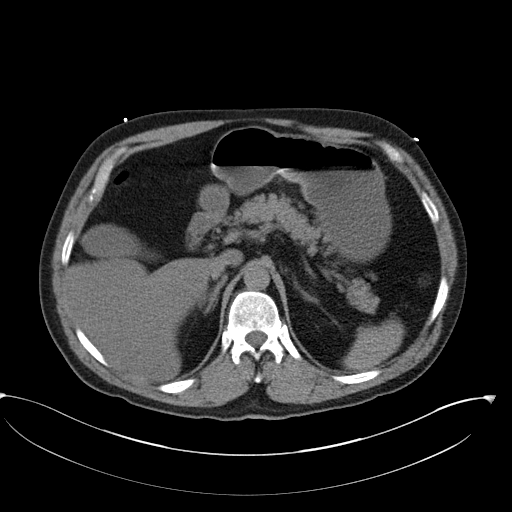
[im 81/113  bone]
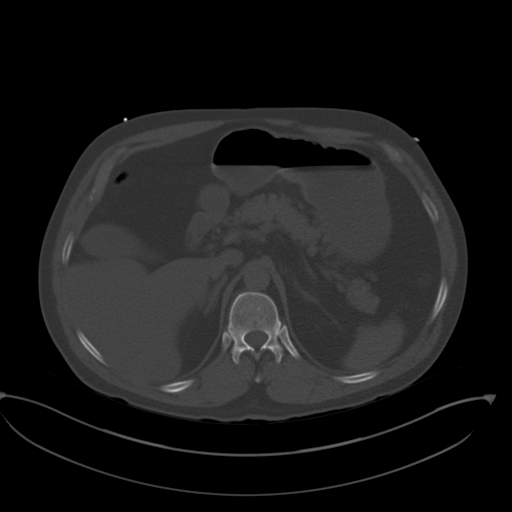
[im 88/113  soft-tissue]
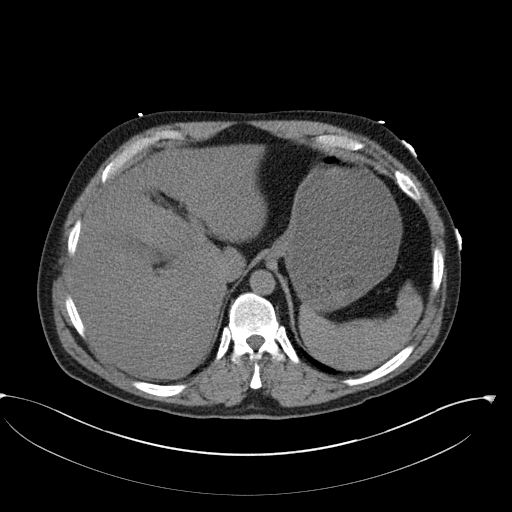
[im 94/113  soft-tissue]
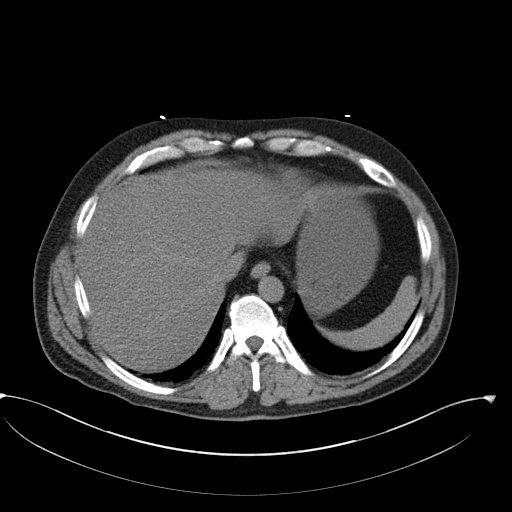
[im 106/113  soft-tissue]
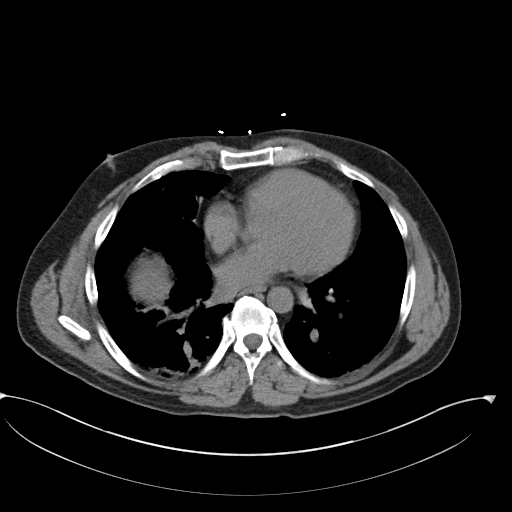

[Series 5: coronal st · coronal · 0.87mm/px · 3 of 156 slices shown]
[im 52/156  soft-tissue]
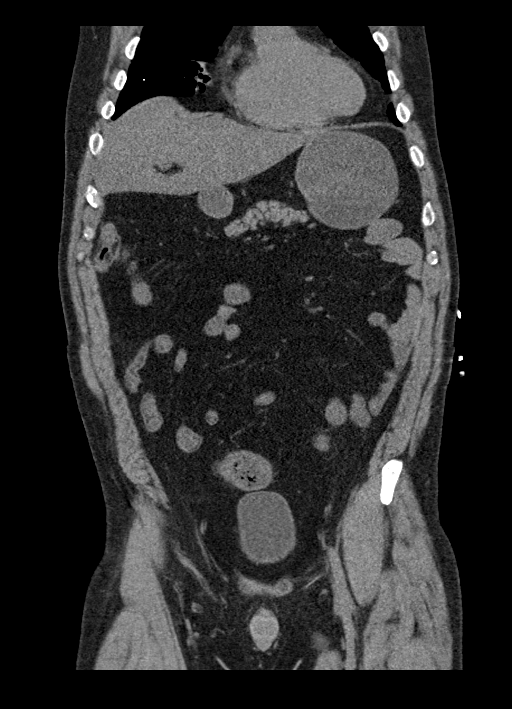
[im 69/156  soft-tissue]
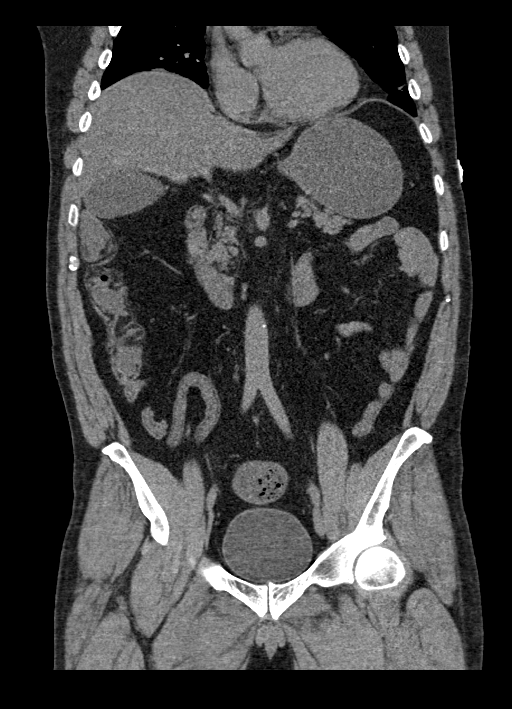
[im 87/156  soft-tissue]
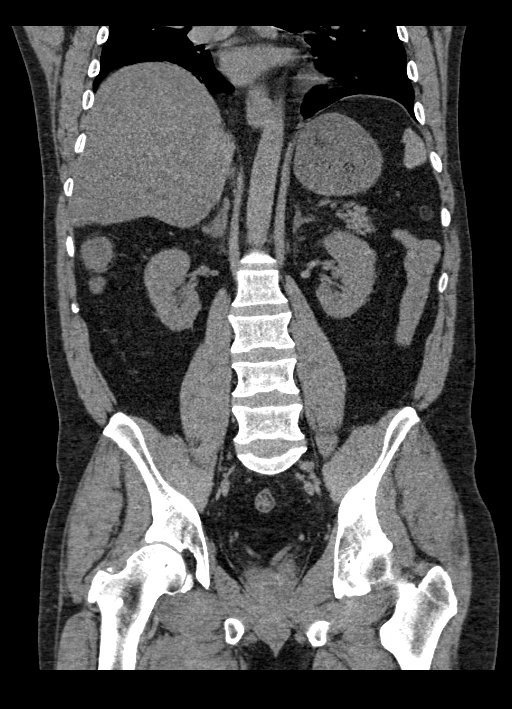

[15 of 46 positions shown; findings below may reference images not displayed]

FINDINGS: Lower chest: The visualized heart size within normal limits. No
pericardial fluid/thickening.

No hiatal hernia.

Mild streaky patchy airspace opacity seen at the right lung base.

Hepatobiliary: Although limited due to the lack of intravenous
contrast, normal in appearance without gross focal abnormality. No
evidence of calcified gallstones or biliary ductal dilatation.

Pancreas:  Unremarkable.  No surrounding inflammatory changes.

Spleen: Normal in size. Although limited due to the lack of
intravenous contrast, normal in appearance.

Adrenals/Urinary Tract: Both adrenal glands appear normal. The
kidneys and collecting system appear normal without evidence of
urinary tract calculus or hydronephrosis. Bladder is unremarkable.

Stomach/Bowel: The stomach, small bowel, are normal in appearance.
There is a small anterior fat and tiny portion of transverse colon
containing hernia seen along the anterior abdominal wall. A surgical
anastomosis seen within the left distal colon. No areas of focal
wall thickening or bowel dilatation are seen. There is a moderate
amount of colonic stool present.

Vascular/Lymphatic: There are no enlarged abdominal or pelvic lymph
nodes. Scattered mild aortic atherosclerosis is seen.

Reproductive: The prostate is unremarkable.

Other: No evidence of abdominal wall mass or hernia.

Musculoskeletal: No acute or significant osseous findings.
Heterotopic ossifications are seen around the right hip.
IMPRESSION: Small portion of transverse colon and fat containing anterior
umbilical hernia, however no definite evidence bowel obstruction or
strangulation.

Moderate amount of colonic stool.

Patchy streaky airspace opacity at the right lung base which may be
due to atelectasis and/or infectious etiology.

Aortic Atherosclerosis ([IG]-[IG]).

## 2019-10-27 IMAGING — CT CT MAXILLOFACIAL W/O CM
3 series · 15 of 47 positions shown, 18 images · non-contrast
Comparison: None.

CLINICAL DATA: Post injury, syncopal episode and fall.

EXAM:
CT HEAD WITHOUT CONTRAST
CT MAXILLOFACIAL WITHOUT CONTRAST
CT CERVICAL SPINE WITHOUT CONTRAST
TECHNIQUE: Multidetector CT imaging of the head, cervical spine, and
maxillofacial structures were performed using the standard protocol
without intravenous contrast. Multiplanar CT image reconstructions
of the cervical spine and maxillofacial structures were also
generated.

[Series 3: max soft · axial · 0.39mm/px · z∈[+1394,+1560]mm · 9 of 97 slices shown, 12 images]
[im 7/97  brain]
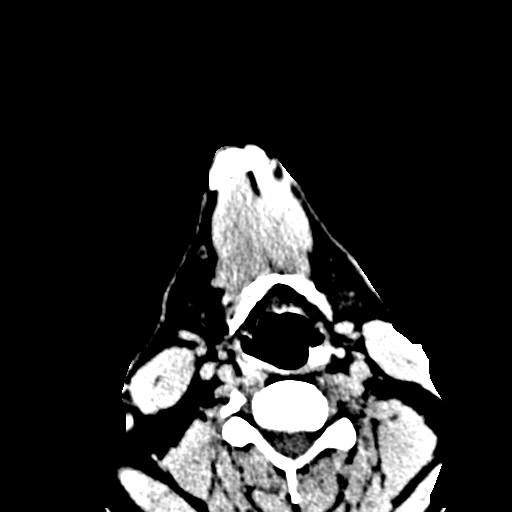
[im 7/97  bone]
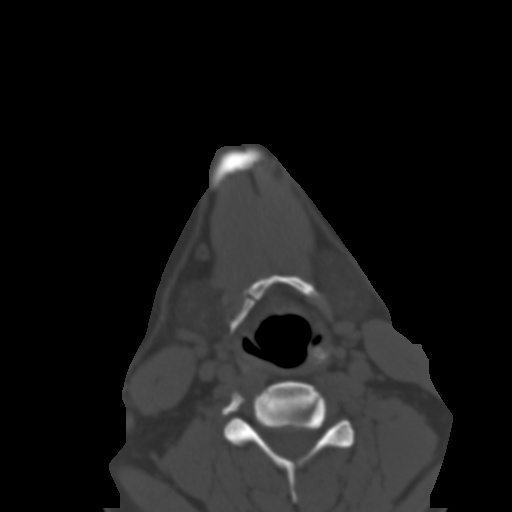
[im 17/97  bone]
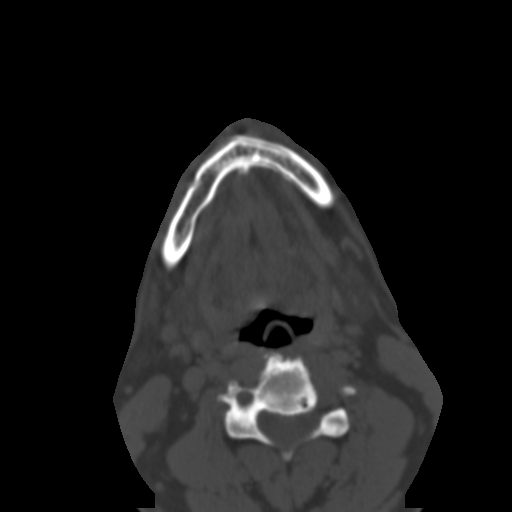
[im 27/97  bone]
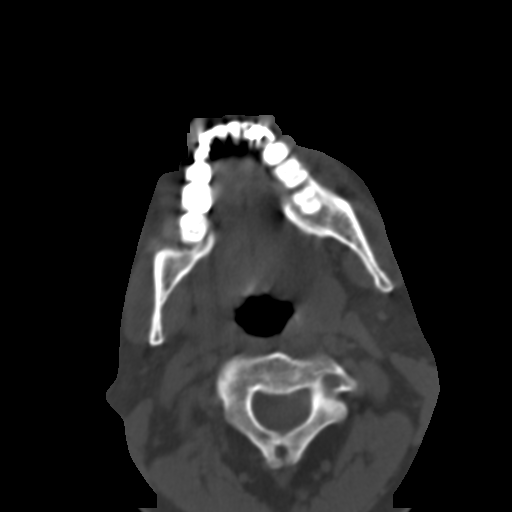
[im 37/97  bone]
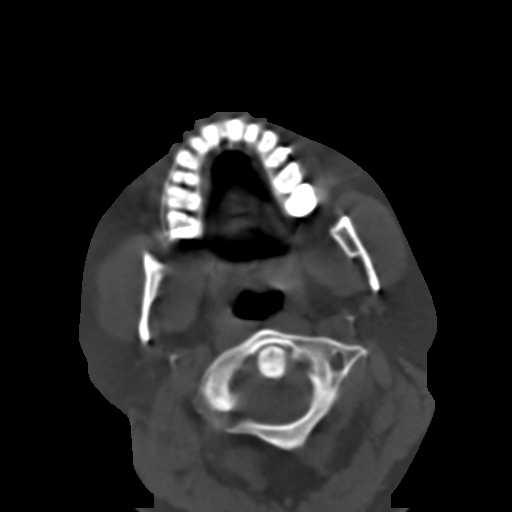
[im 50/97  brain]
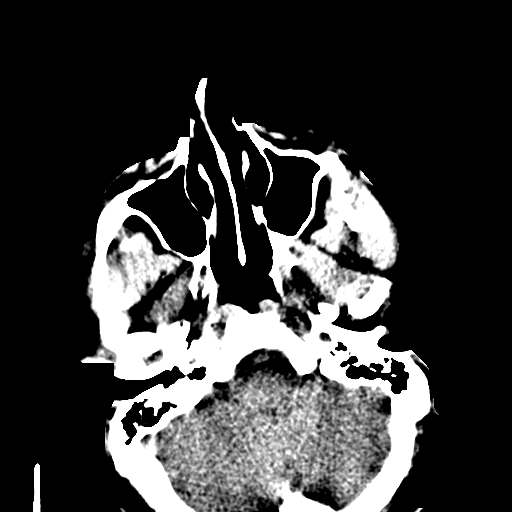
[im 50/97  bone]
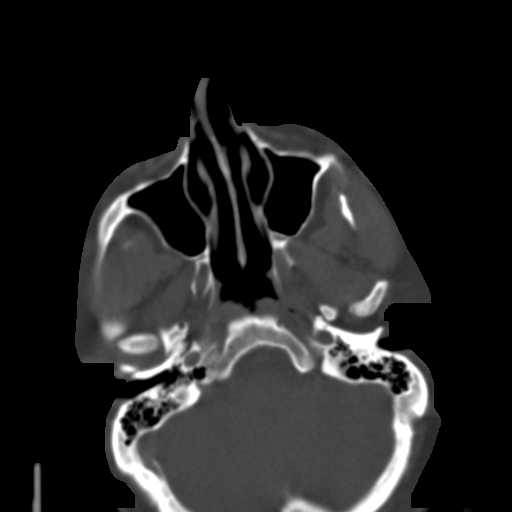
[im 60/97  bone]
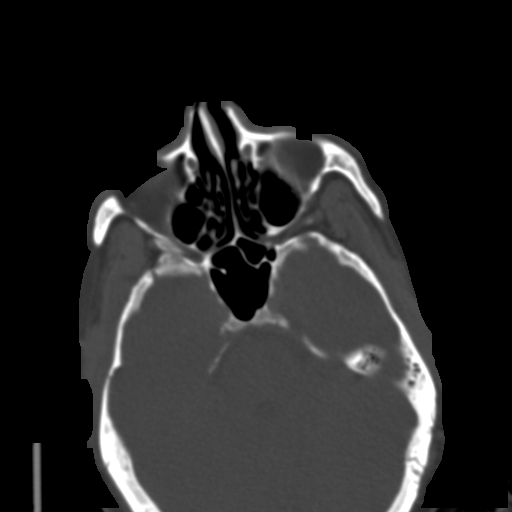
[im 70/97  bone]
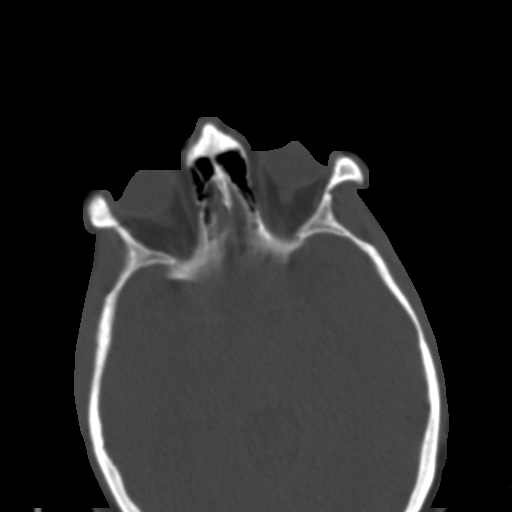
[im 80/97  bone]
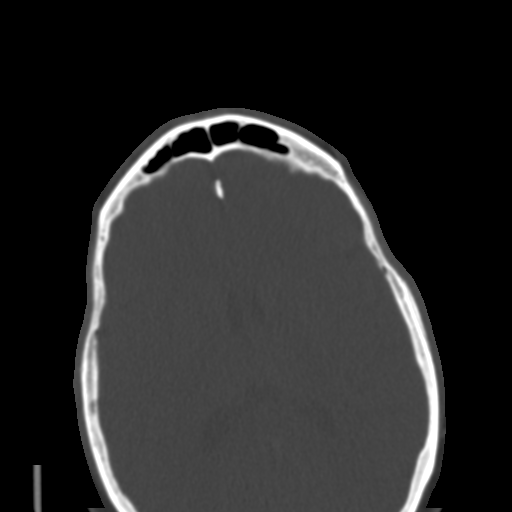
[im 90/97  brain]
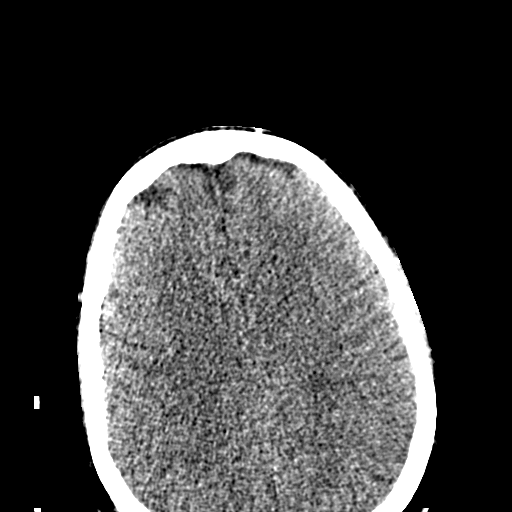
[im 90/97  bone]
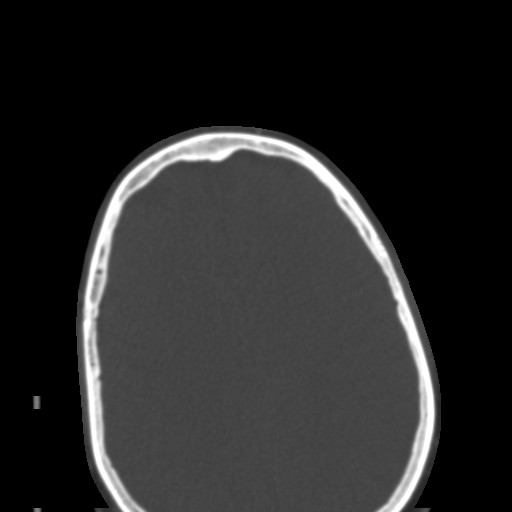

[Series 7: coronal soft · coronal · 0.35mm/px · 3 of 87 slices shown]
[im 29/87  bone]
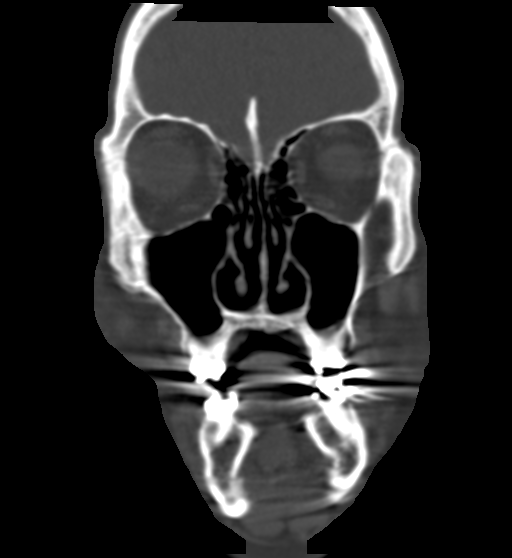
[im 39/87  bone]
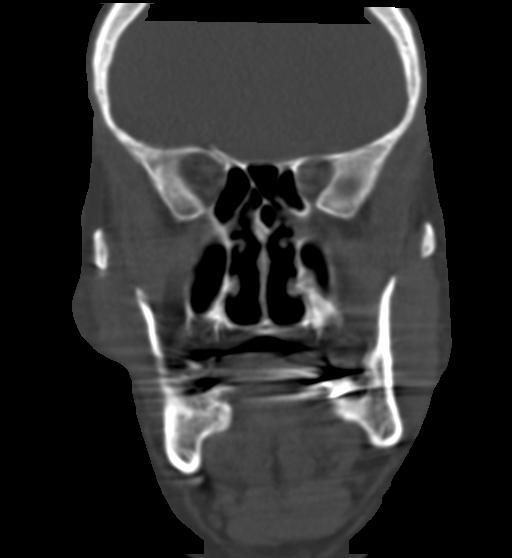
[im 48/87  bone]
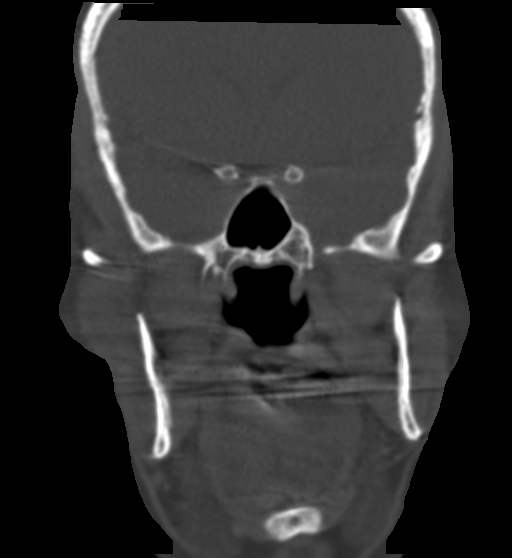

[Series 8: sagittal soft · sagittal · 0.34mm/px · 3 of 90 slices shown]
[im 30/90  bone]
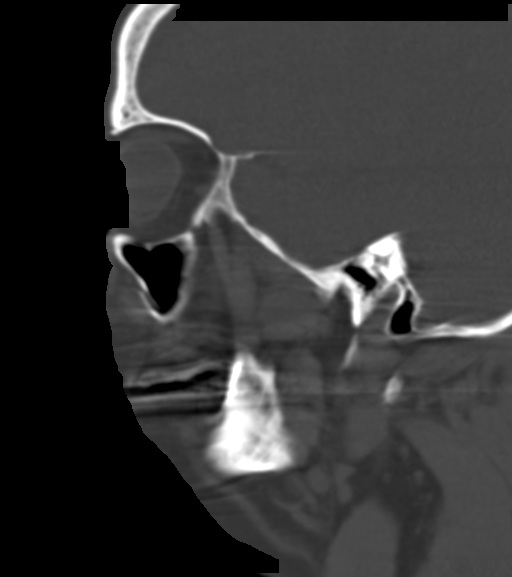
[im 45/90  bone]
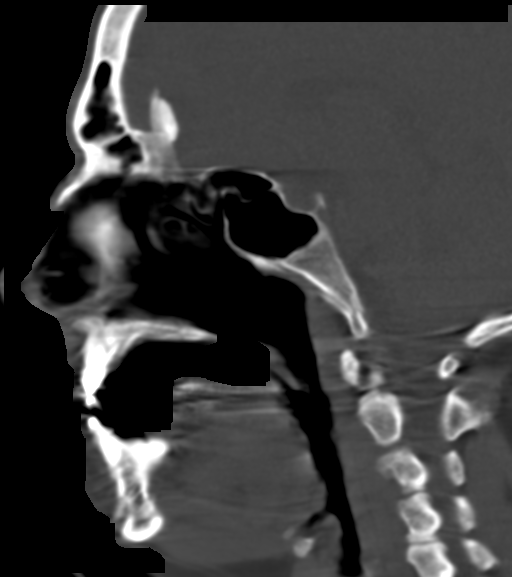
[im 60/90  bone]
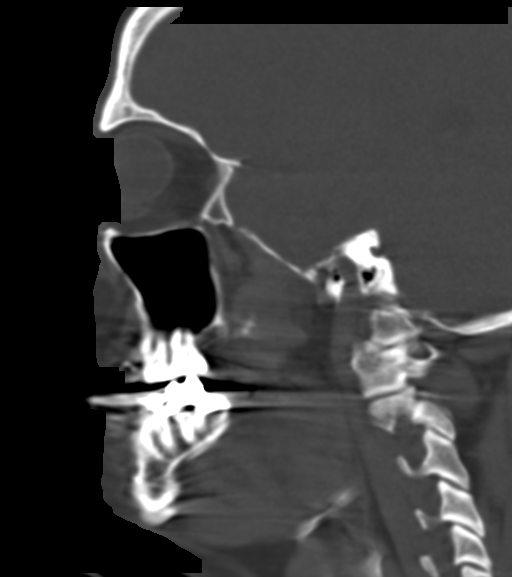

[15 of 47 positions shown; findings below may reference images not displayed]

FINDINGS: CT HEAD FINDINGS

Brain: No evidence of acute infarction, hemorrhage, hydrocephalus,
extra-axial collection or mass lesion/mass effect.

Vascular: No hyperdense vessel or unexpected calcification.

Skull: Normal. Negative for fracture or focal lesion.

Other: None.

CT MAXILLOFACIAL FINDINGS

Osseous: No fracture or mandibular dislocation. No destructive
process.

Orbits: Negative. No traumatic or inflammatory finding.

Sinuses: Clear.

Soft tissues: Negative.

CT CERVICAL SPINE FINDINGS

Alignment: Normal.

Skull base and vertebrae: No acute fracture. No primary bone lesion
or focal pathologic process.

Soft tissues and spinal canal: No prevertebral fluid or swelling. No
visible canal hematoma.

Disc levels:  Mild osteoarthritic changes at C2-C3, C3-C4 and C4-C5.

Upper chest: Negative.

Other: None.
IMPRESSION: 1. No acute intracranial abnormality.
2. No evidence of acute traumatic injury to the cervical spine.
3. No evidence of facial fractures.
4. Mild osteoarthritic changes of the cervical spine.

## 2019-10-27 IMAGING — CR DG ANKLE 2V *R*
2 series · 2 of 2 positions shown · non-contrast
Comparison: None.

CLINICAL DATA: Trauma, fall.  Right ankle pain.

EXAM:
RIGHT ANKLE - 2 VIEW

[x ankle lat right]
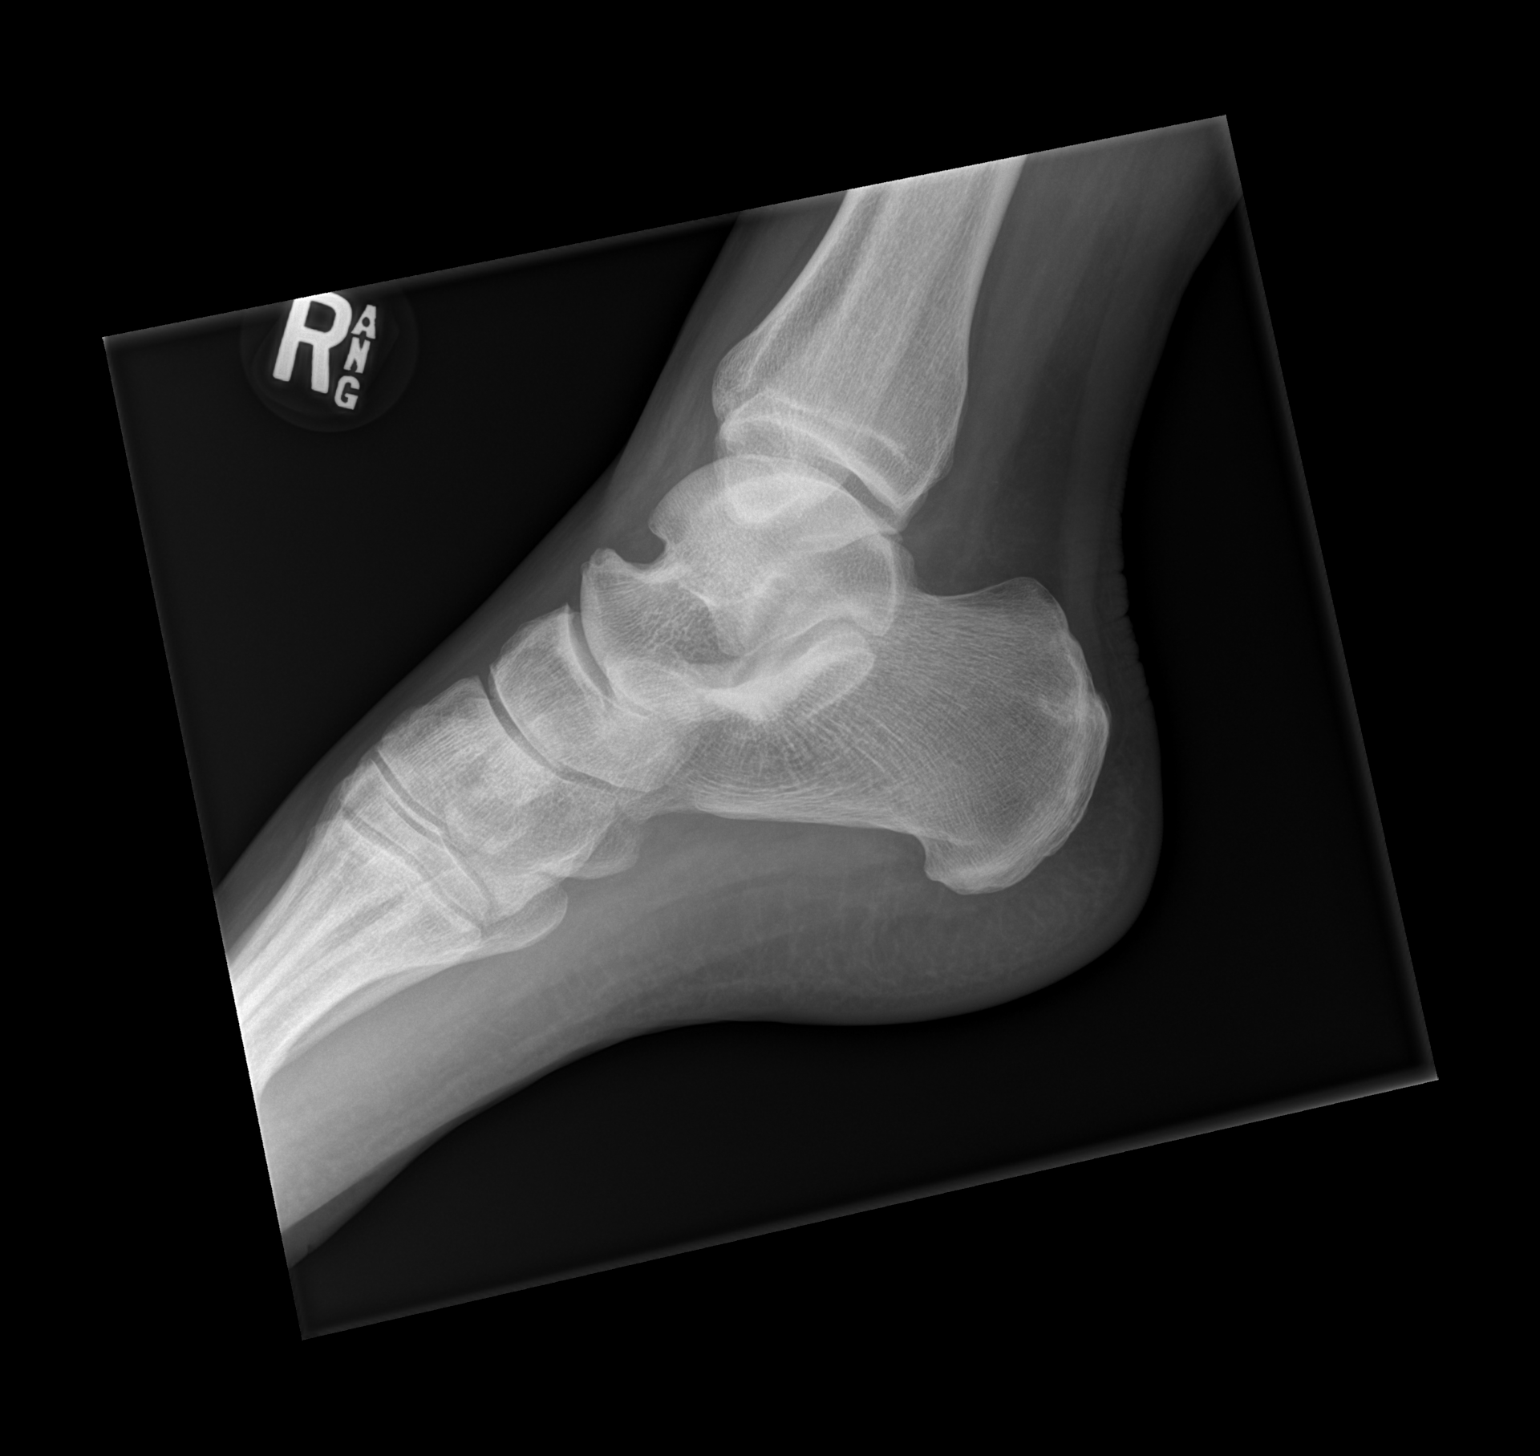

[x ankle ap right]
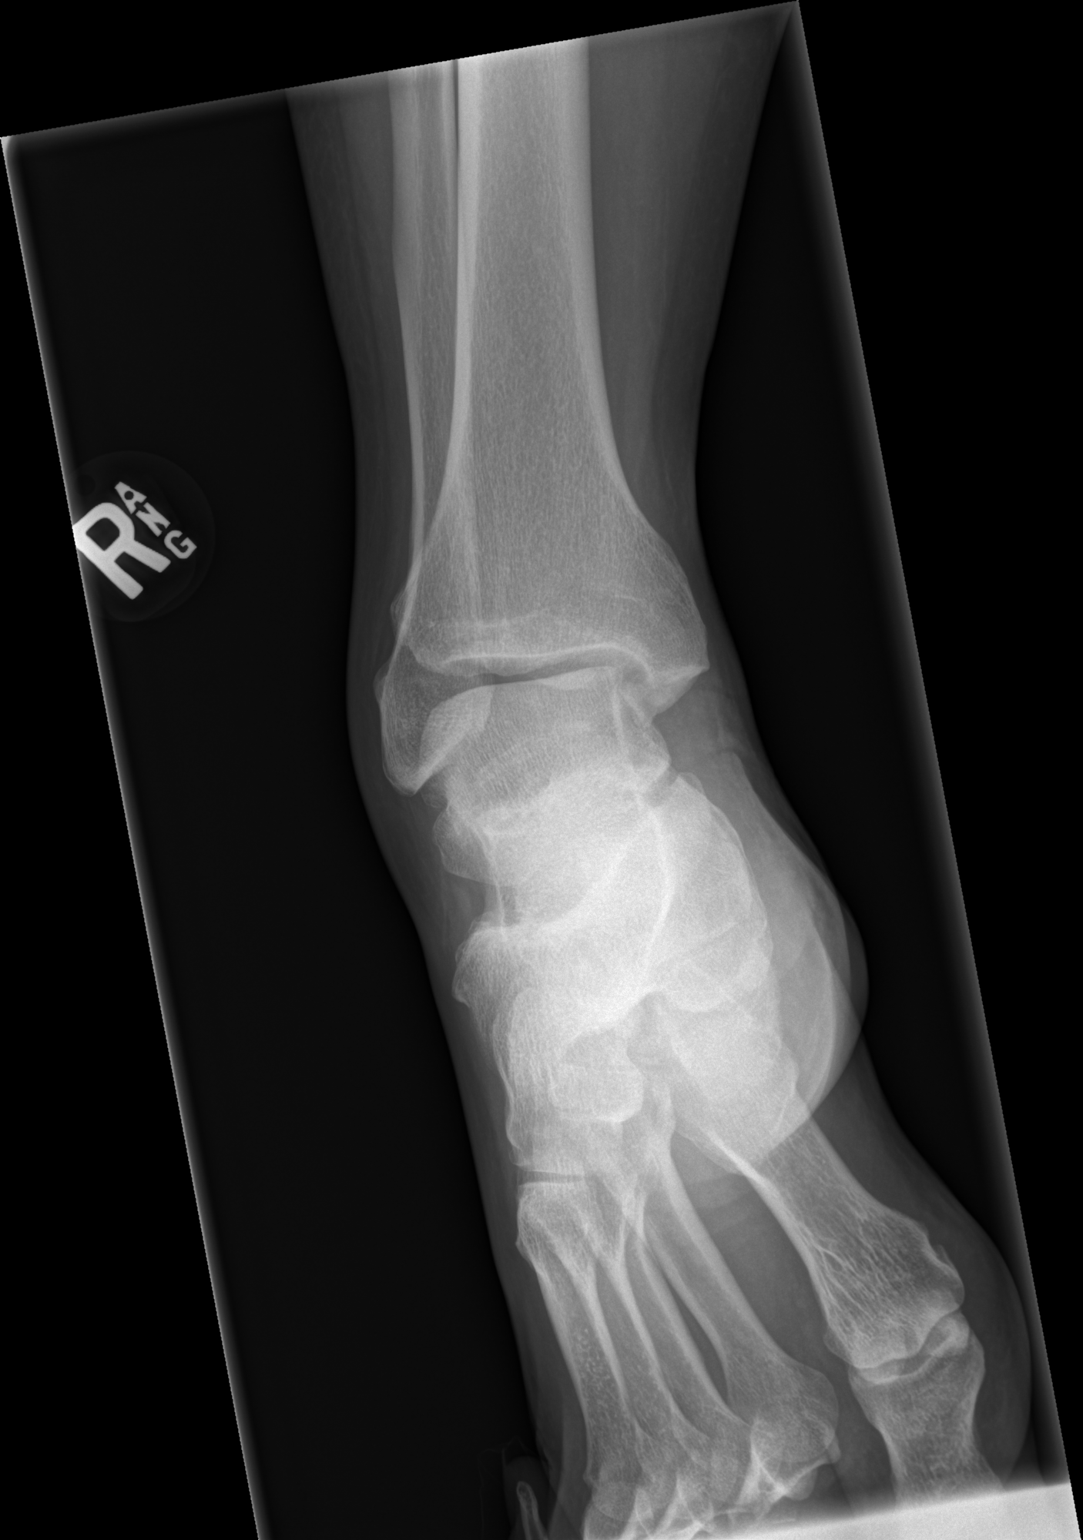

[2 of 2 positions shown; findings below may reference images not displayed]

FINDINGS: No acute fracture or dislocation. There is slight lateral talar
tilt. No mortise widening. No ankle joint effusion. No focal soft
tissue abnormality.
IMPRESSION: No acute fracture or dislocation. Slight lateral talar tilt.

## 2019-10-27 IMAGING — CR DG HIP (WITH OR WITHOUT PELVIS) 2-3V*R*
4 series · 4 of 4 positions shown · non-contrast
Comparison: Remote radiograph [DATE]

CLINICAL DATA: Fall onto right side.  History of right hip surgery.

EXAM:
DG HIP (WITH OR WITHOUT PELVIS) 2-3V RIGHT

[x pelvis]
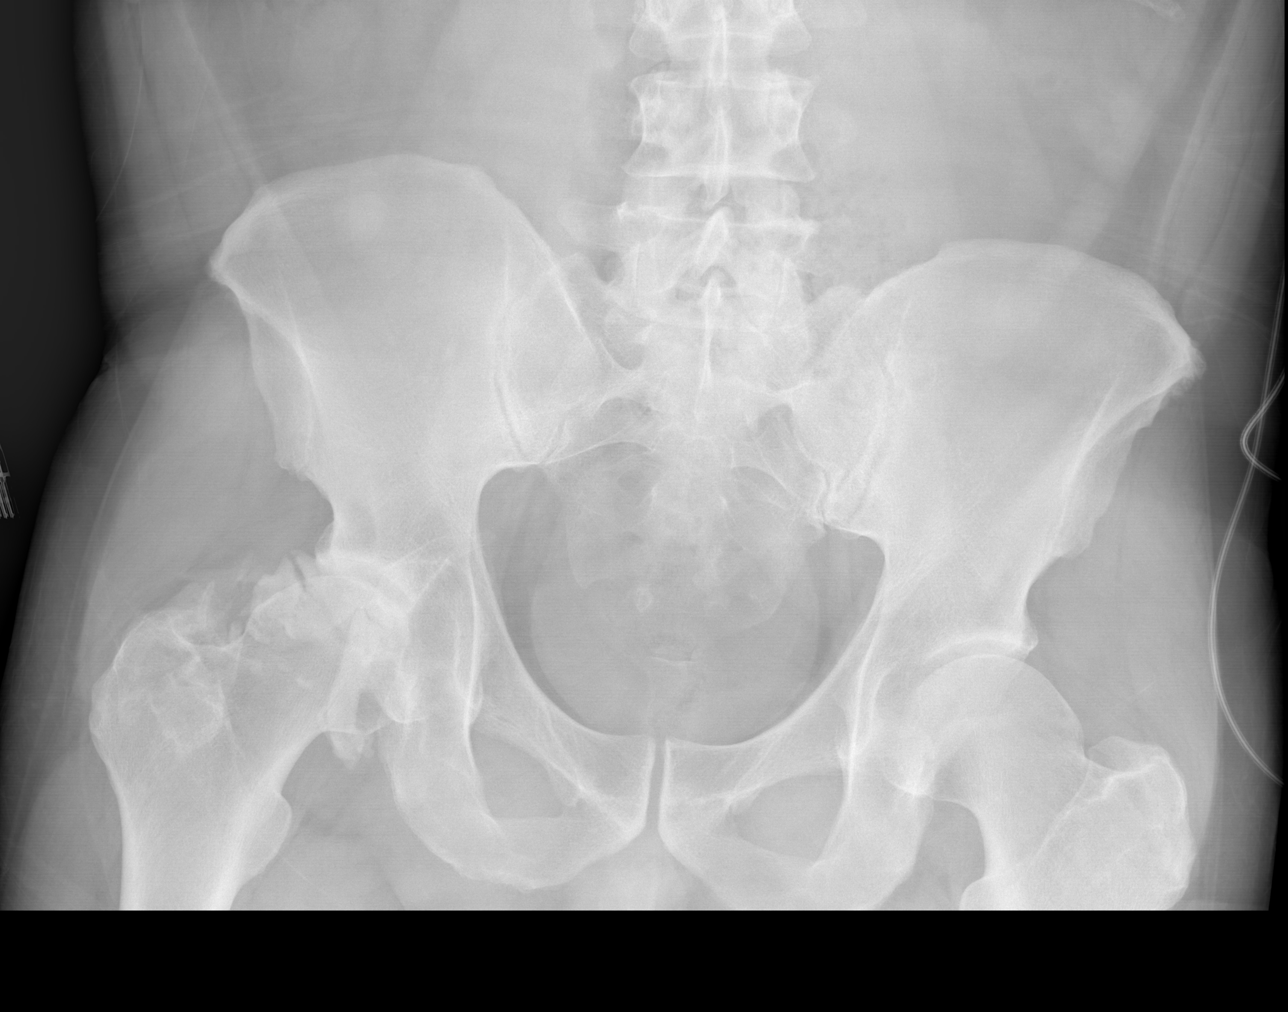

[x hip ap right]
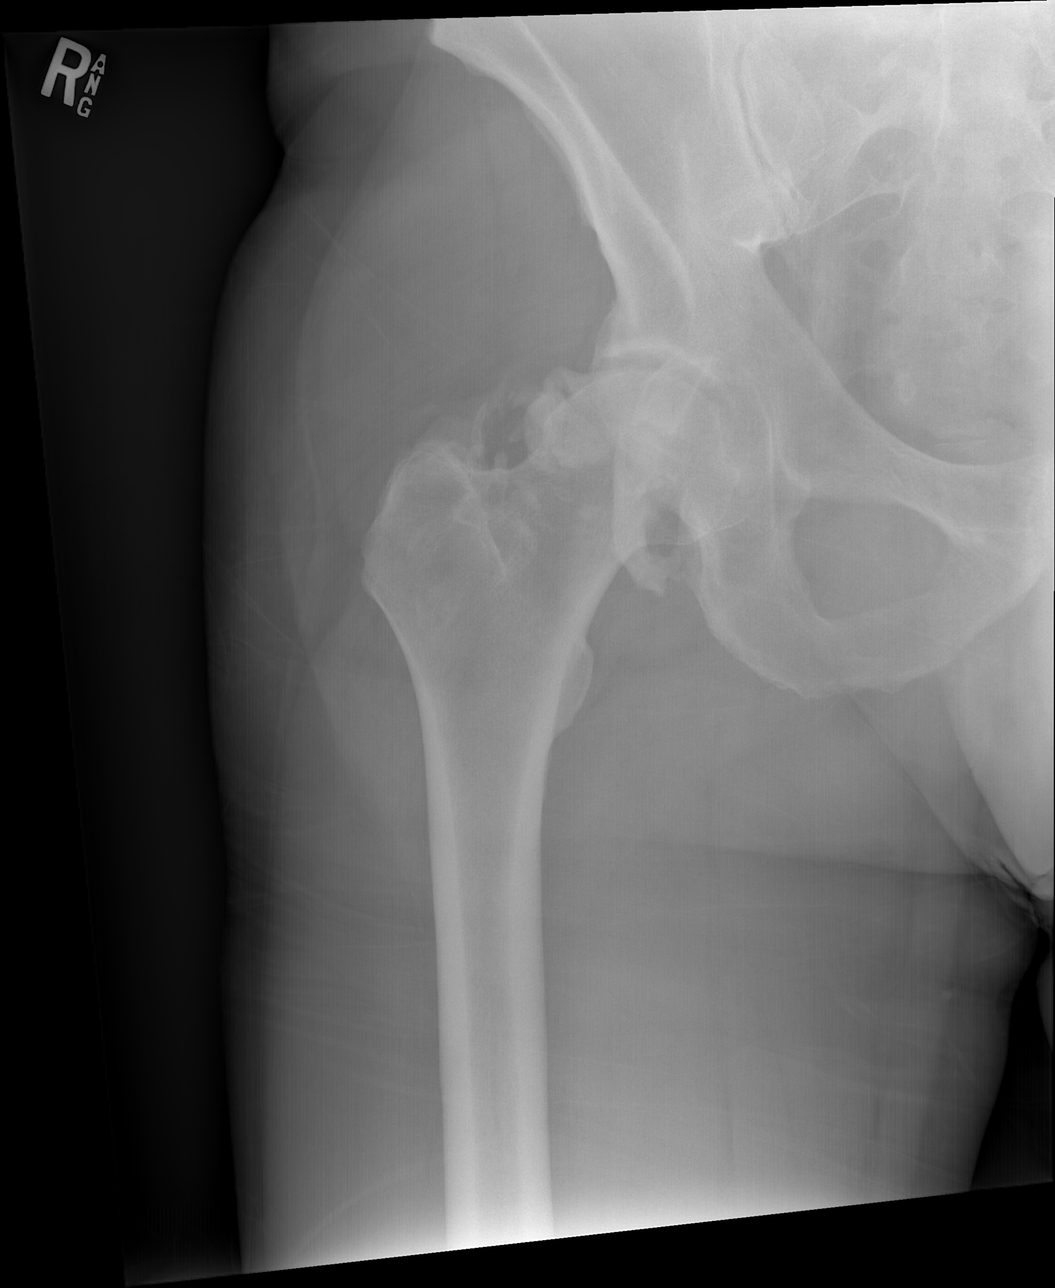

[w hip lat right (1 of 2)]
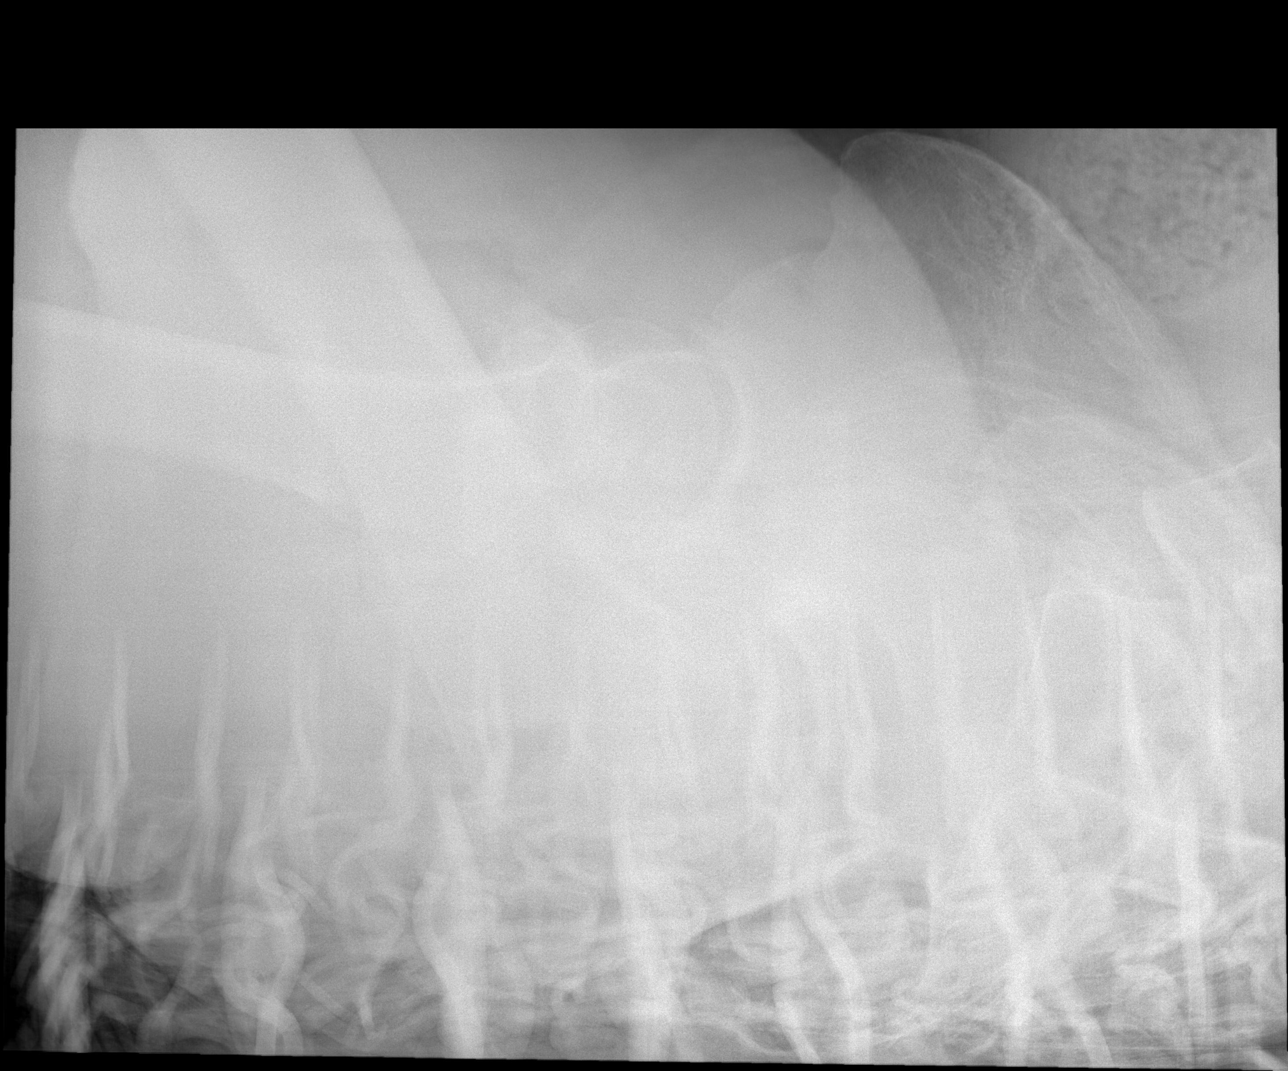

[w hip lat right (2 of 2)]
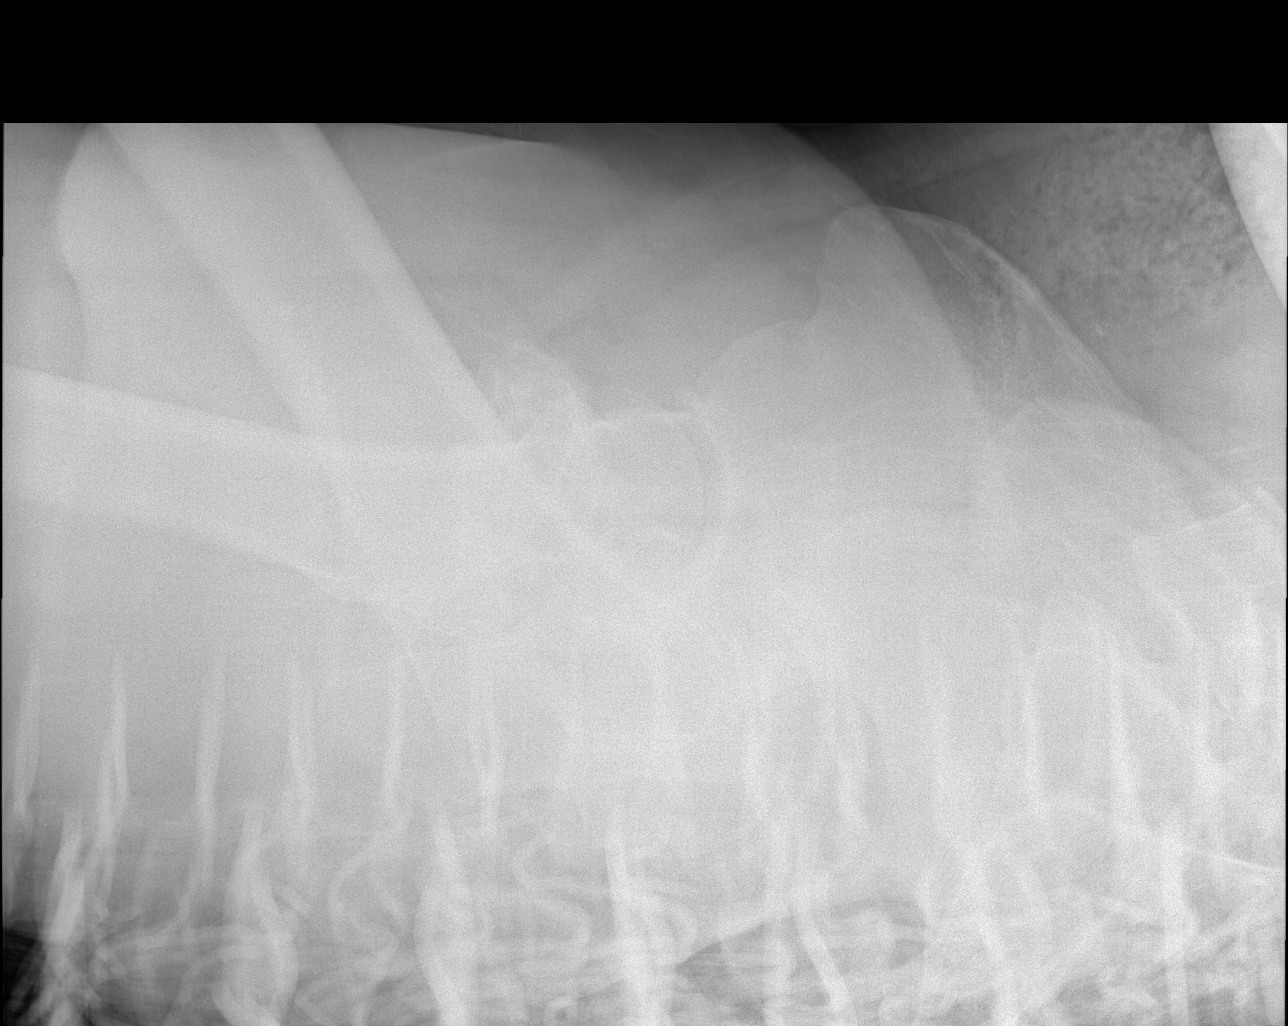

[4 of 4 positions shown; findings below may reference images not displayed]

FINDINGS: No evidence of acute fracture. There is heterotopic ossification
about the right hip. This appears increased from prior exam. Femoral
head is seated in the acetabulum. Pubic rami are intact. Pubic
symphysis and sacroiliac joints are congruent. There is slight
flattening of the left femoral head neck junction that is unchanged
from prior.
IMPRESSION: No acute fracture or subluxation of the pelvis or right hip.

## 2019-10-27 IMAGING — MR MR HEAD W/O CM
10 series · 43 of 48 positions shown · non-contrast
Comparison: Head CT [DATE]

CLINICAL DATA: Syncope.  Right-sided weakness.

EXAM:
MRI HEAD WITHOUT CONTRAST
TECHNIQUE: Multiplanar, multiecho pulse sequences of the brain and surrounding
structures were obtained without intravenous contrast.

[Series 5: dwi_tracew · axial · 3.0mm · 0.92mm/px · z∈[-33,+120]mm · 8 of 108 slices shown]
[im 1/108]
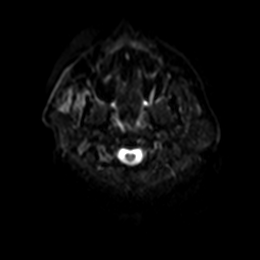
[im 20/108]
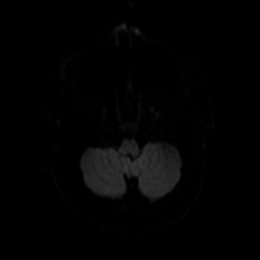
[im 30/108]
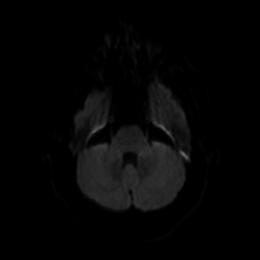
[im 49/108]
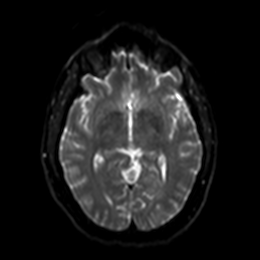
[im 59/108]
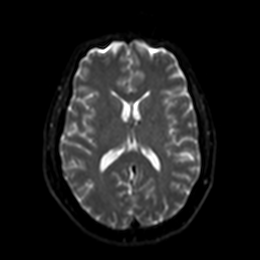
[im 78/108]
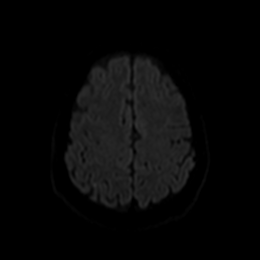
[im 88/108]
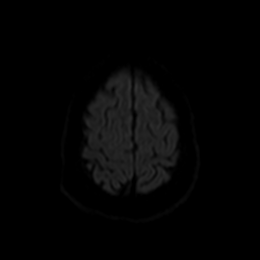
[im 108/108]
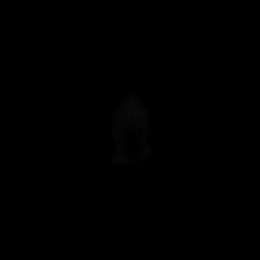

[Series 6: dwi_adc · axial · 3.0mm · 0.92mm/px · z∈[-33,+88]mm · 5 of 54 slices shown]
[im 1/54]
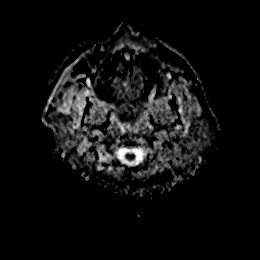
[im 11/54]
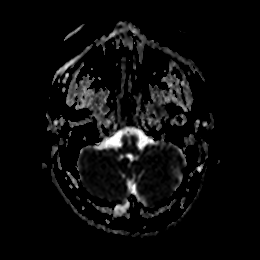
[im 22/54]
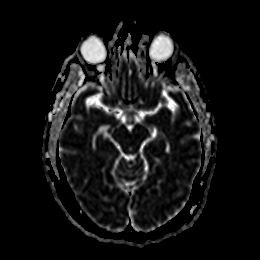
[im 32/54]
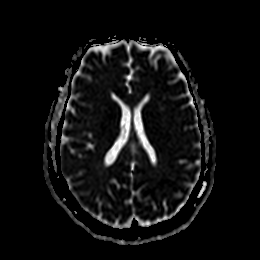
[im 43/54]
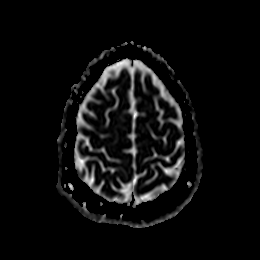

[Series 7: GRE · axial · 4.0mm · 0.45mm/px · z∈[-34,+121]mm · 4 of 41 slices shown]
[im 1/41]
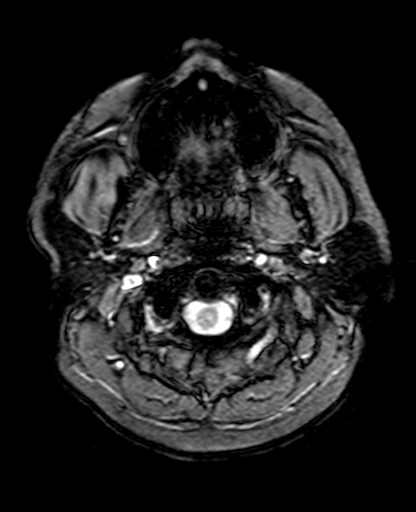
[im 14/41]
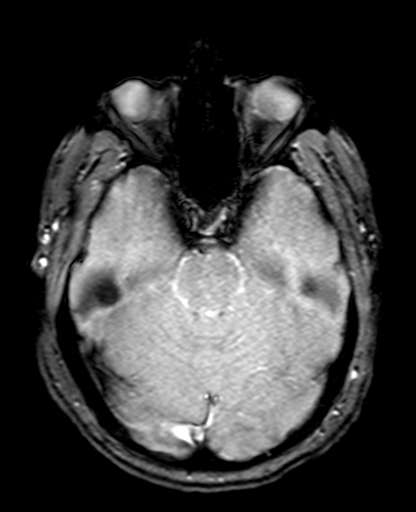
[im 27/41]
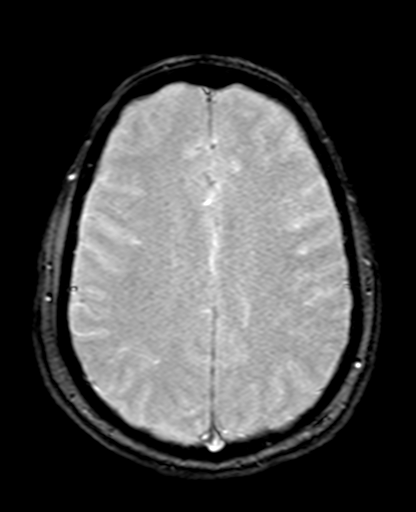
[im 41/41]
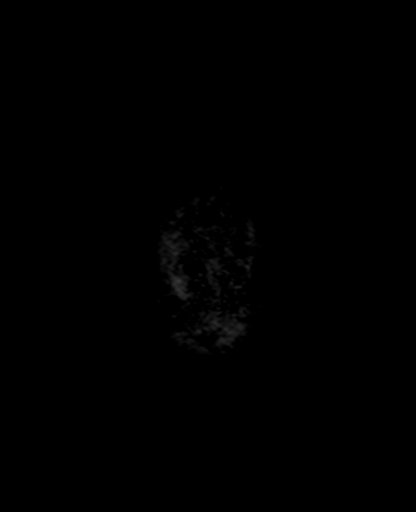

[Series 8: FLAIR · axial · 4.0mm · 0.86mm/px · z∈[-33,+118]mm · 4 of 40 slices shown]
[im 1/40]
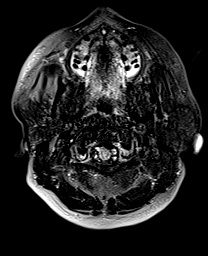
[im 14/40]
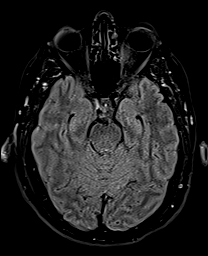
[im 27/40]
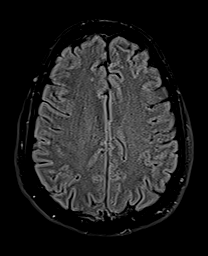
[im 40/40]
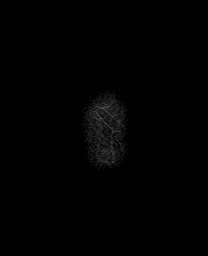

[Series 9: DWI · coronal · 5.0mm · 1.31mm/px · 6 of 57 slices shown (1 of 2)]
[im 1/57]
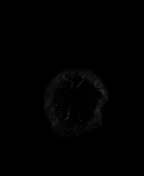
[im 12/57]
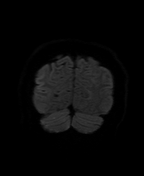
[im 23/57]
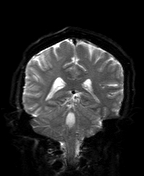
[im 34/57]
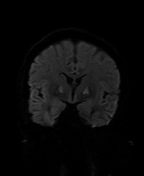
[im 45/57]
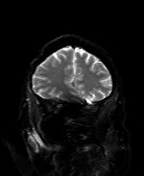
[im 57/57]
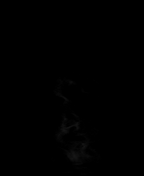

[Series 10: DWI · coronal · 5.0mm · 1.31mm/px · 3 of 30 slices shown (2 of 2)]
[im 1/30]
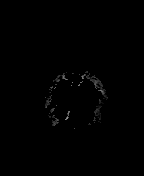
[im 15/30]
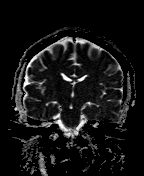
[im 30/30]
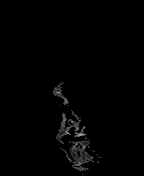

[Series 11: T2 · sagittal · 5.0mm · 0.47mm/px · 3 of 26 slices shown (1 of 3)]
[im 1/26]
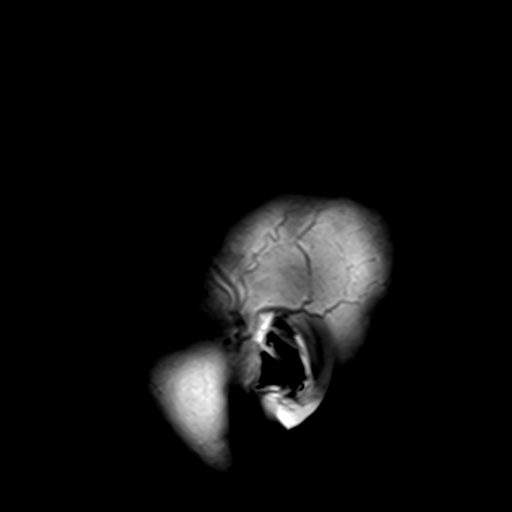
[im 13/26]
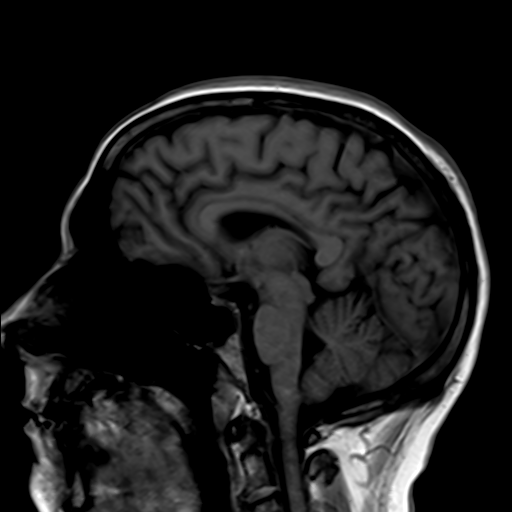
[im 26/26]
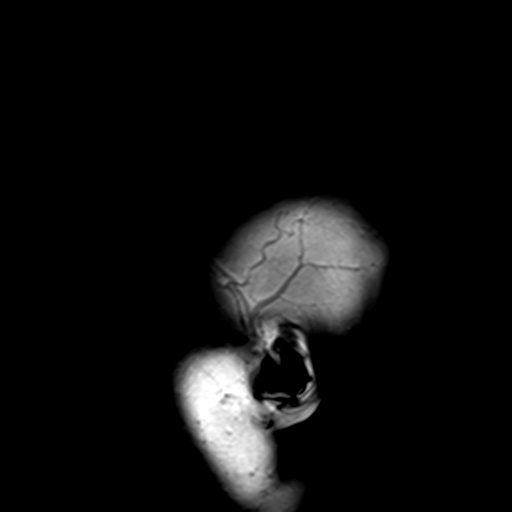

[Series 12: T2 · axial · 5.0mm · 0.45mm/px · z∈[-39,+118]mm · 3 of 26 slices shown (2 of 3)]
[im 1/26]
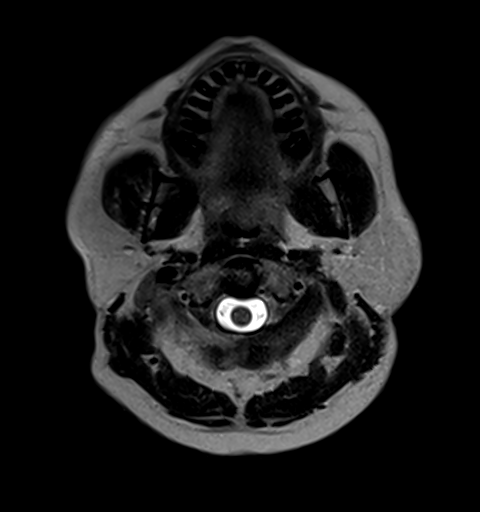
[im 13/26]
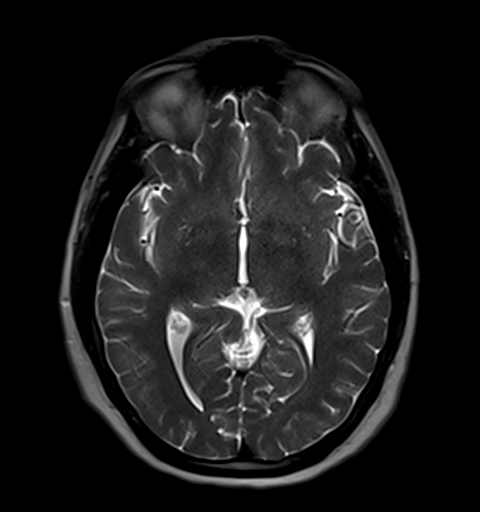
[im 26/26]
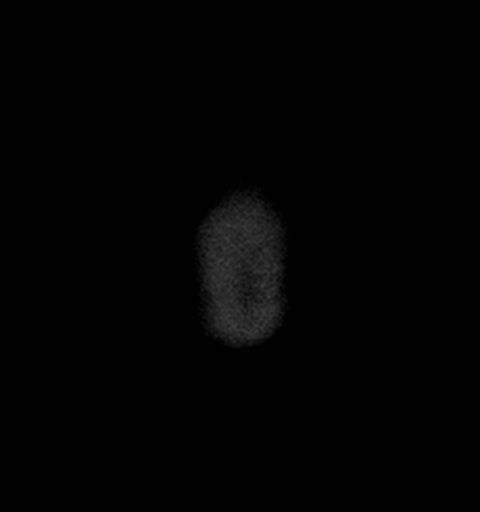

[Series 13: T1 · axial · 4.0mm · 0.45mm/px · z∈[-32,+119]mm · 4 of 40 slices shown]
[im 1/40]
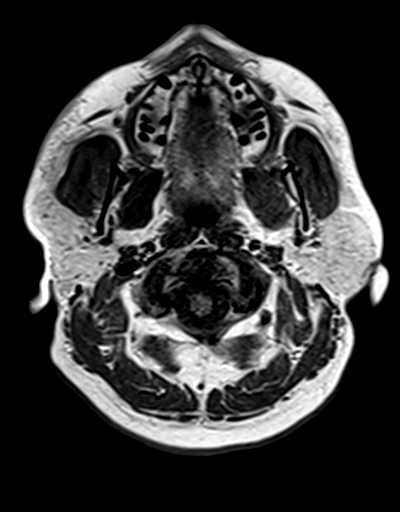
[im 14/40]
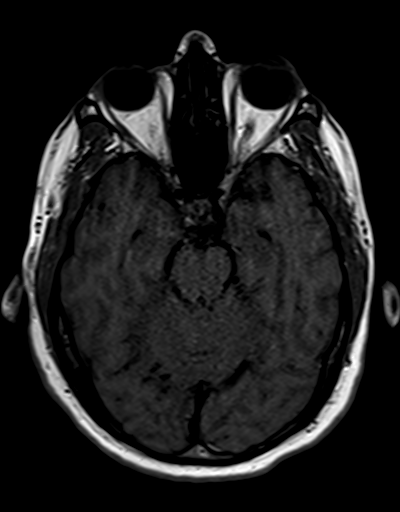
[im 27/40]
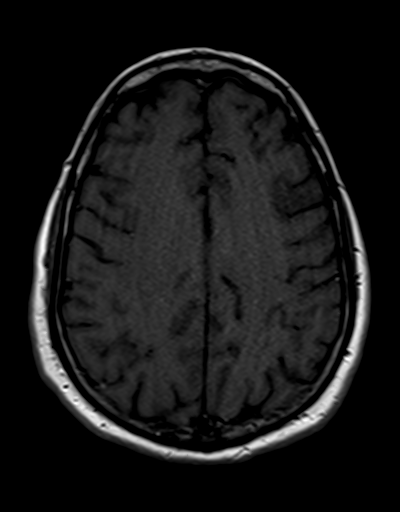
[im 40/40]
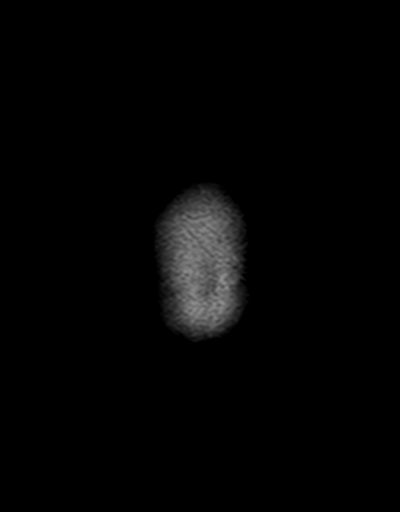

[Series 14: T2 · coronal · 5.0mm · 0.86mm/px · 3 of 30 slices shown (3 of 3)]
[im 1/30]
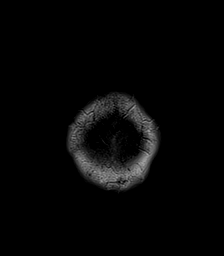
[im 15/30]
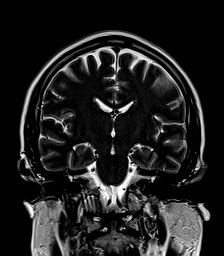
[im 30/30]
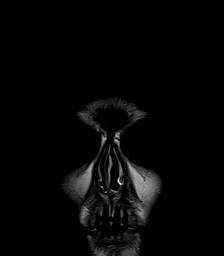

[43 of 48 positions shown; findings below may reference images not displayed]

FINDINGS: Brain: There is symmetric restricted diffusion and T2 hyperintensity
in the globi pallidi. There are also 2 subcentimeter foci of
restricted diffusion in the right occipital lobe and a single
punctate focus of restricted diffusion in the right cerebellum. The
brain is normal in signal elsewhere. No intracranial hemorrhage,
mass, midline shift, or extra-axial fluid collection is identified.
The ventricles and sulci are normal.

Vascular: Major intracranial vascular flow voids are preserved.

Skull and upper cervical spine: Unremarkable bone marrow signal.

Sinuses/Orbits: Unremarkable orbits. Paranasal sinuses and mastoid
air cells are clear.

Other: None.
IMPRESSION: 1. Symmetric restricted diffusion in the globi pallidi. This is most
often seen with an acute toxic or metabolic insult (including carbon
monoxide poisoning and drugs of abuse) or hypoxic-ischemic injury.
No hemorrhage.
2. Punctate acute infarcts in the right occipital lobe and right
cerebellum.

## 2019-10-27 IMAGING — CT CT CERVICAL SPINE W/O CM
3 of 4 series · 13 of 33 positions shown, 16 images · non-contrast
Comparison: None.

CLINICAL DATA: Post injury, syncopal episode and fall.

EXAM:
CT HEAD WITHOUT CONTRAST
CT MAXILLOFACIAL WITHOUT CONTRAST
CT CERVICAL SPINE WITHOUT CONTRAST
TECHNIQUE: Multidetector CT imaging of the head, cervical spine, and
maxillofacial structures were performed using the standard protocol
without intravenous contrast. Multiplanar CT image reconstructions
of the cervical spine and maxillofacial structures were also
generated.

[Series 6: orthogonal axials · axial · 0.23mm/px · z∈[+1313,+1453]mm · 5 of 116 slices shown, 7 images]
[im 20/116  soft-tissue]
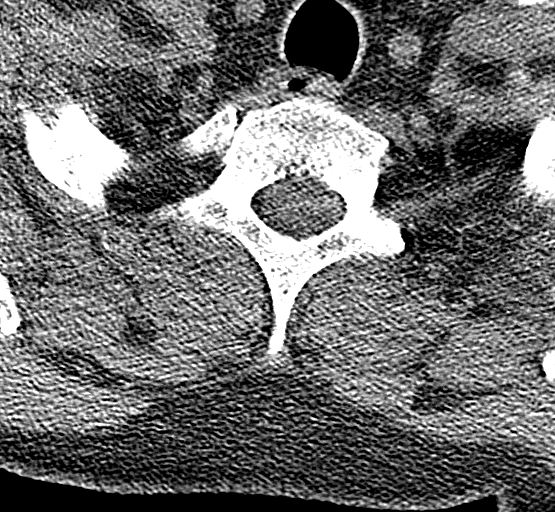
[im 20/116  bone]
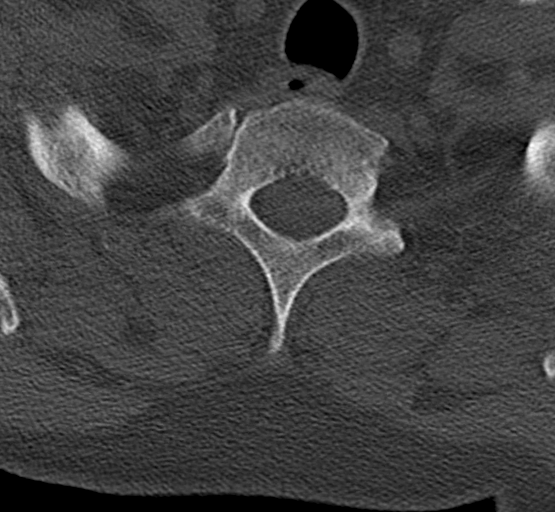
[im 39/116  bone]
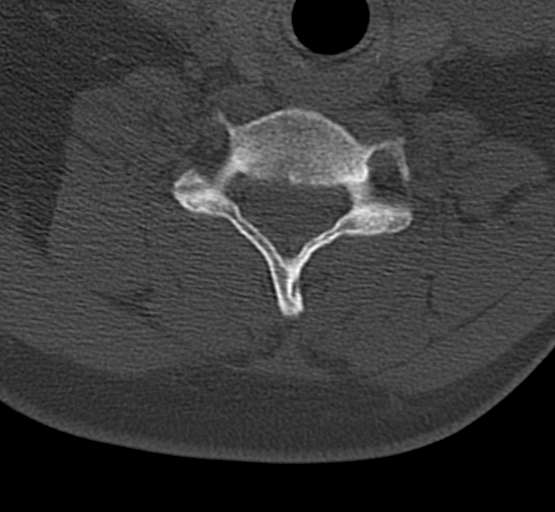
[im 58/116  bone]
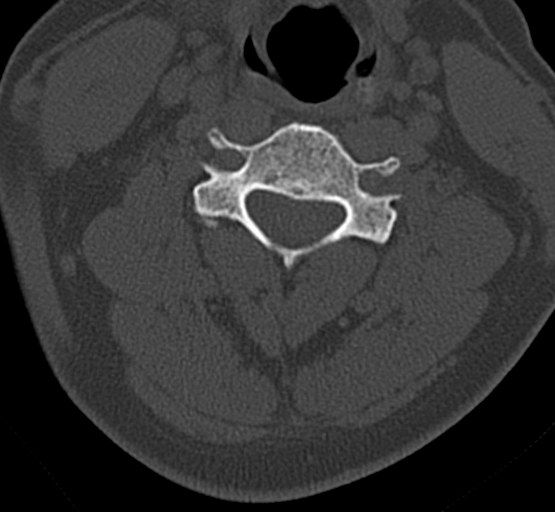
[im 77/116  bone]
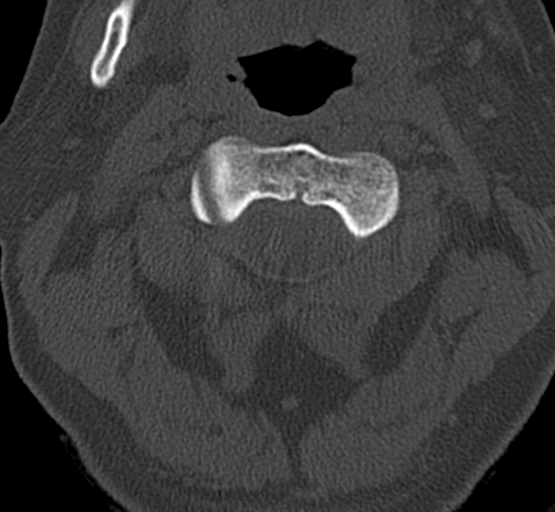
[im 96/116  soft-tissue]
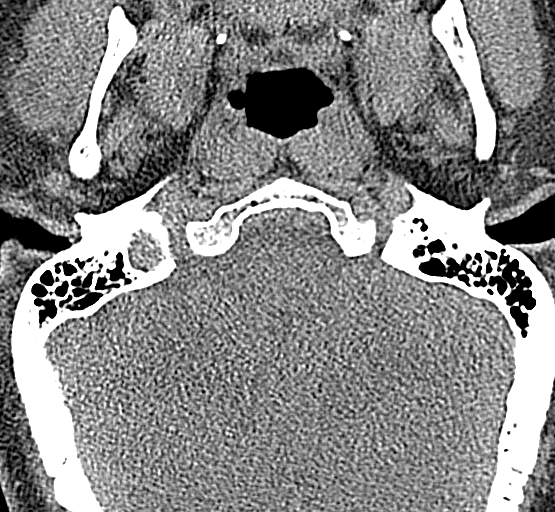
[im 96/116  bone]
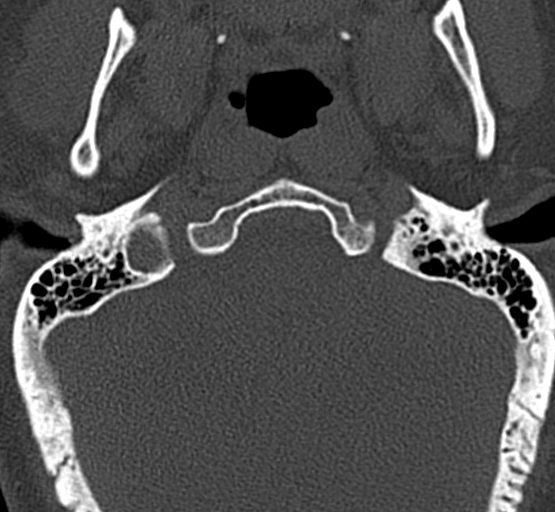

[Series 7: coronal bone · coronal · 0.30mm/px · 3 of 61 slices shown]
[im 13/61  bone]
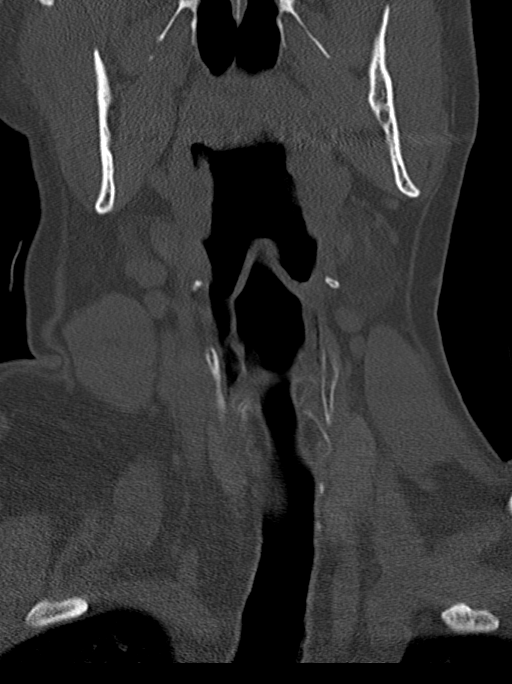
[im 25/61  bone]
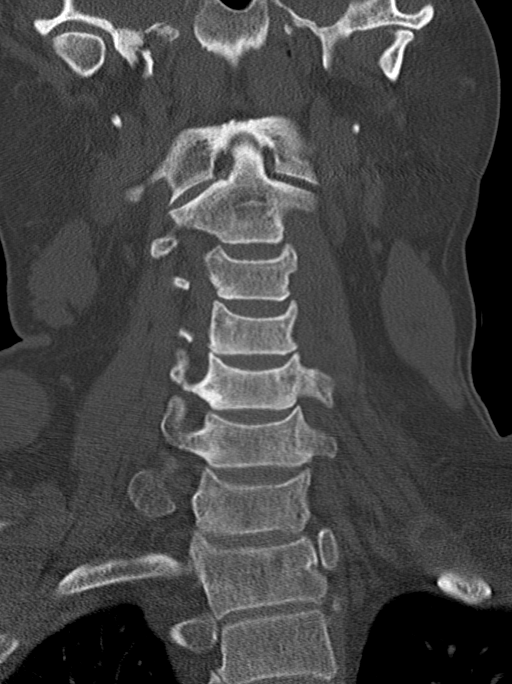
[im 37/61  bone]
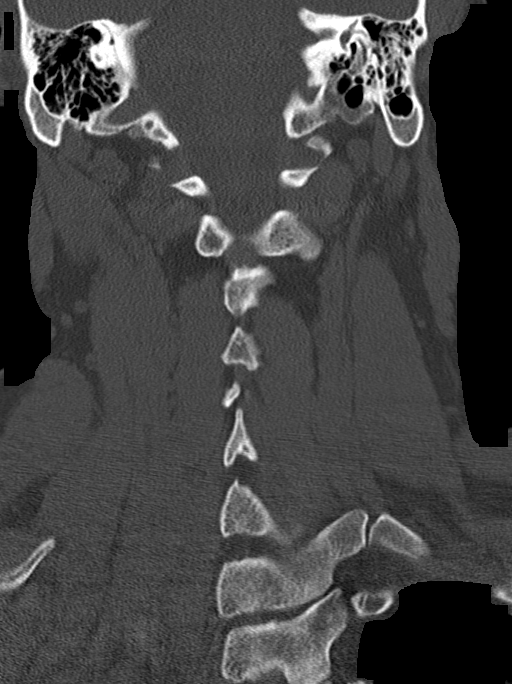

[Series 8: sagittal bone · sagittal · 0.30mm/px · 5 of 58 slices shown, 6 images]
[im 20/58  bone]
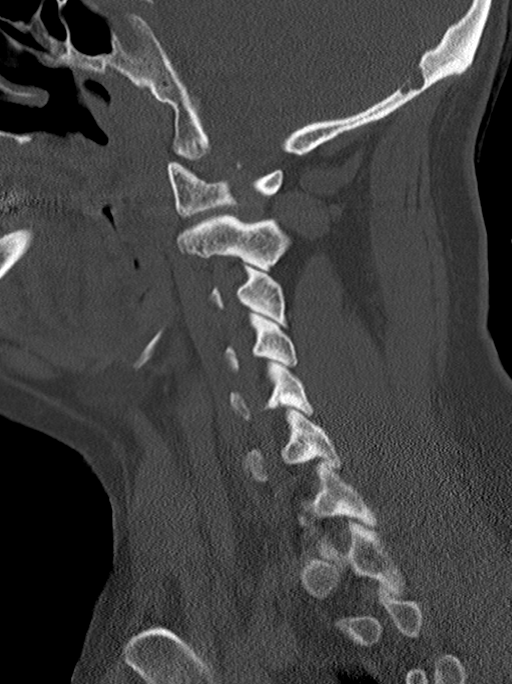
[im 24/58  bone]
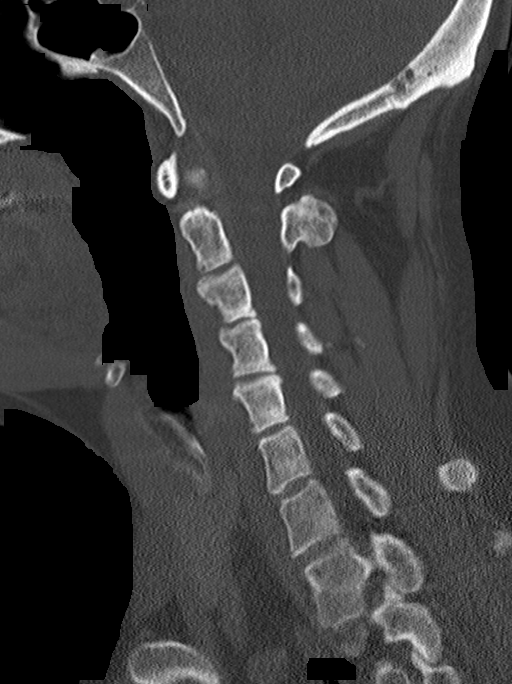
[im 29/58  soft-tissue]
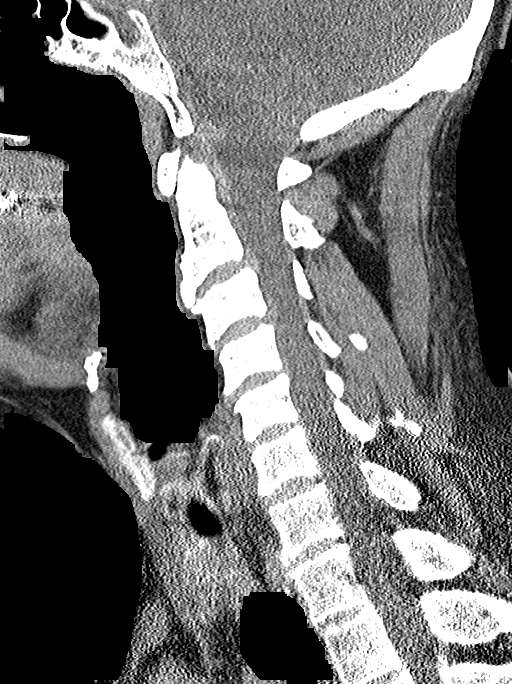
[im 29/58  bone]
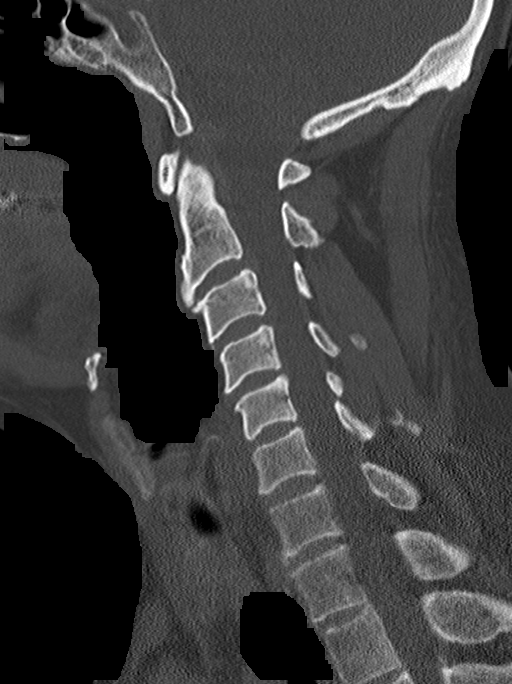
[im 34/58  bone]
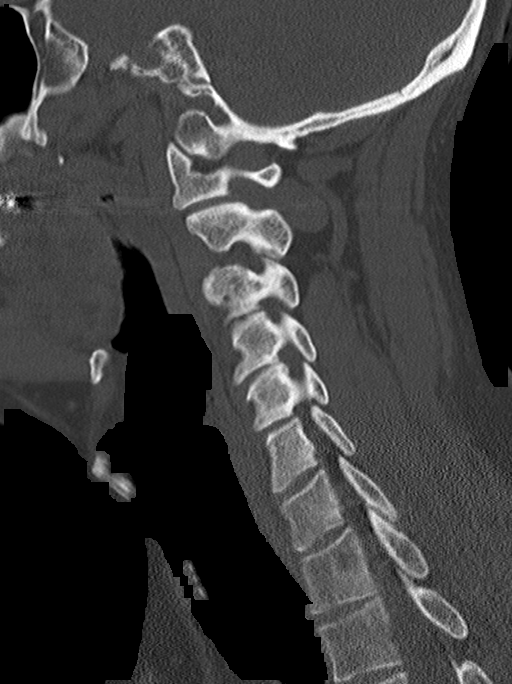
[im 39/58  bone]
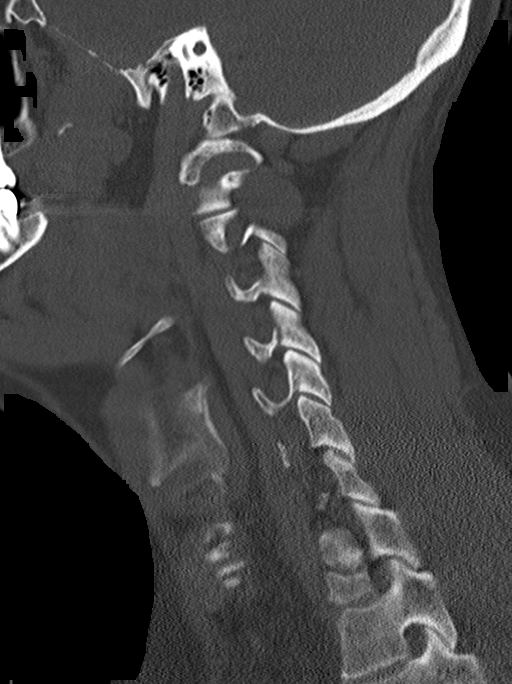

[13 of 33 positions shown; findings below may reference images not displayed]

FINDINGS: CT HEAD FINDINGS

Brain: No evidence of acute infarction, hemorrhage, hydrocephalus,
extra-axial collection or mass lesion/mass effect.

Vascular: No hyperdense vessel or unexpected calcification.

Skull: Normal. Negative for fracture or focal lesion.

Other: None.

CT MAXILLOFACIAL FINDINGS

Osseous: No fracture or mandibular dislocation. No destructive
process.

Orbits: Negative. No traumatic or inflammatory finding.

Sinuses: Clear.

Soft tissues: Negative.

CT CERVICAL SPINE FINDINGS

Alignment: Normal.

Skull base and vertebrae: No acute fracture. No primary bone lesion
or focal pathologic process.

Soft tissues and spinal canal: No prevertebral fluid or swelling. No
visible canal hematoma.

Disc levels:  Mild osteoarthritic changes at C2-C3, C3-C4 and C4-C5.

Upper chest: Negative.

Other: None.
IMPRESSION: 1. No acute intracranial abnormality.
2. No evidence of acute traumatic injury to the cervical spine.
3. No evidence of facial fractures.
4. Mild osteoarthritic changes of the cervical spine.

## 2019-10-27 IMAGING — CT CT HEAD W/O CM
3 series · 15 of 47 positions shown, 18 images · non-contrast
Comparison: None.

CLINICAL DATA: Post injury, syncopal episode and fall.

EXAM:
CT HEAD WITHOUT CONTRAST
CT MAXILLOFACIAL WITHOUT CONTRAST
CT CERVICAL SPINE WITHOUT CONTRAST
TECHNIQUE: Multidetector CT imaging of the head, cervical spine, and
maxillofacial structures were performed using the standard protocol
without intravenous contrast. Multiplanar CT image reconstructions
of the cervical spine and maxillofacial structures were also
generated.

[Series 4: head wo · axial · 0.47mm/px · z∈[+1490,+1625]mm · 9 of 33 slices shown, 12 images]
[im 3/33  brain]
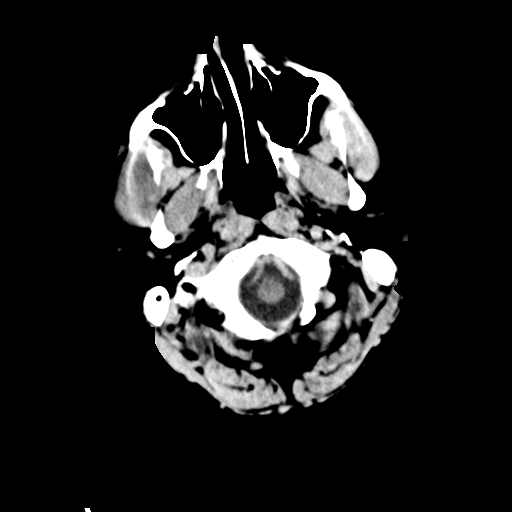
[im 3/33  bone]
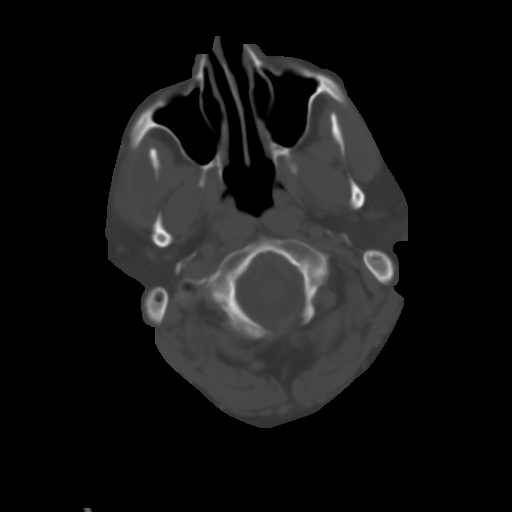
[im 6/33  brain]
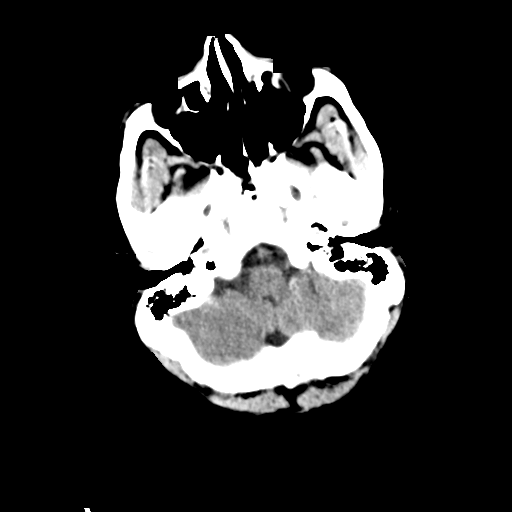
[im 9/33  brain]
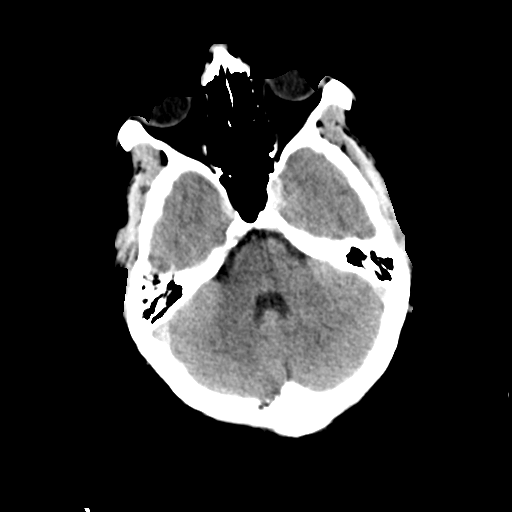
[im 13/33  brain]
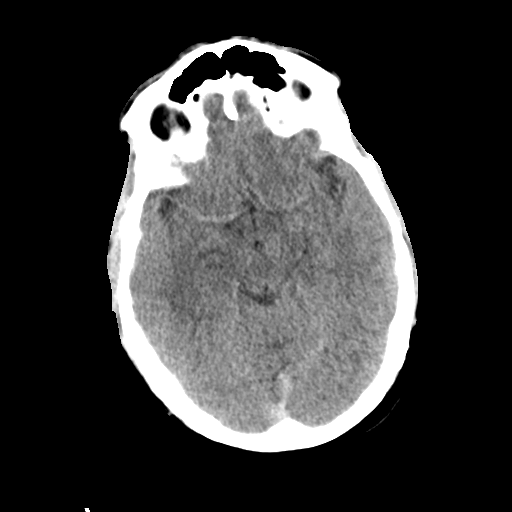
[im 17/33  brain]
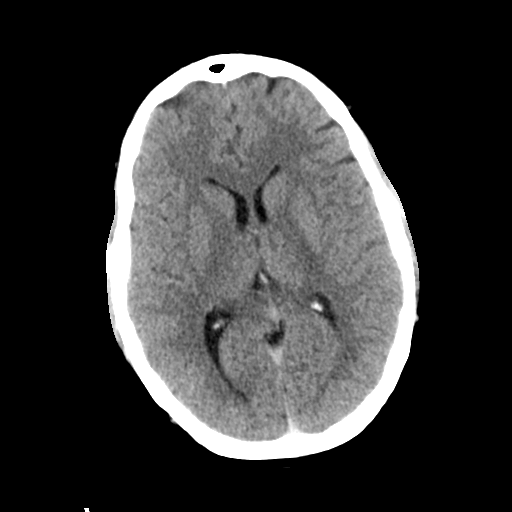
[im 17/33  bone]
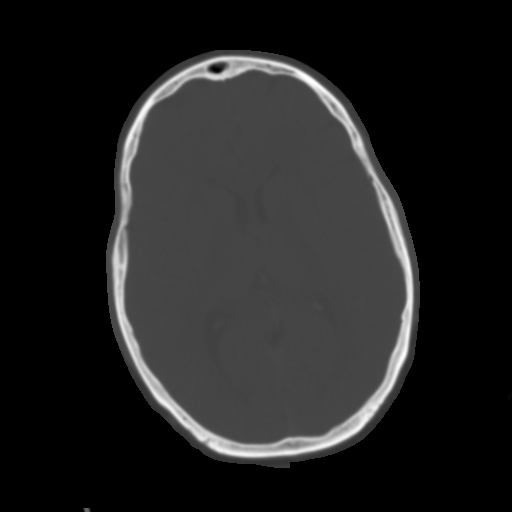
[im 20/33  brain]
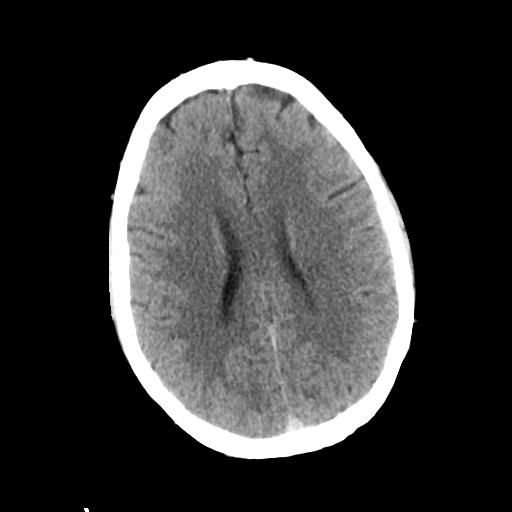
[im 24/33  brain]
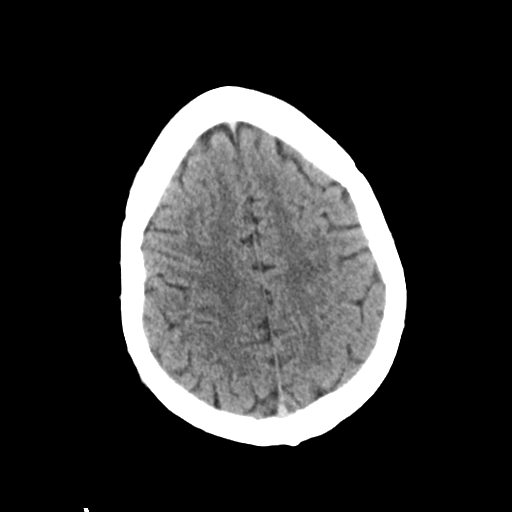
[im 27/33  brain]
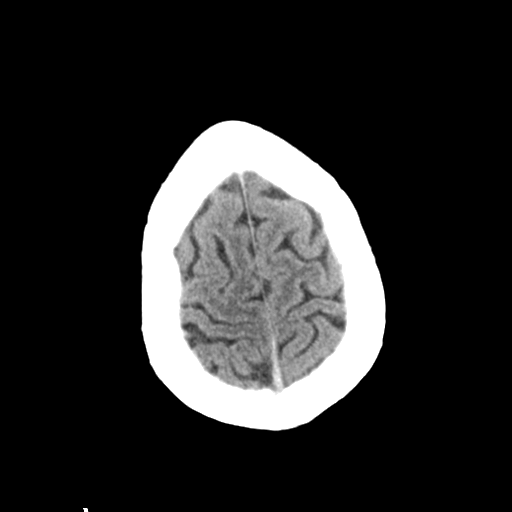
[im 30/33  brain]
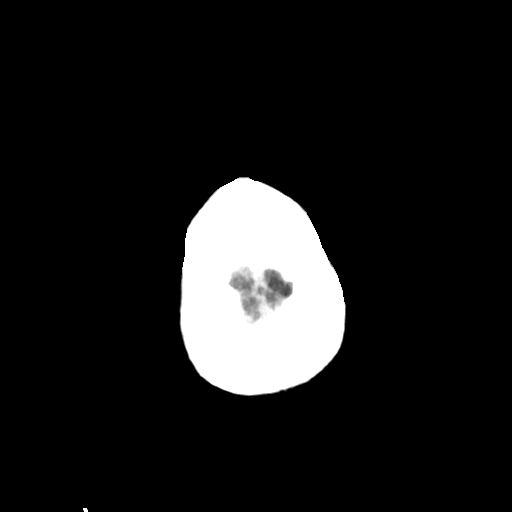
[im 30/33  bone]
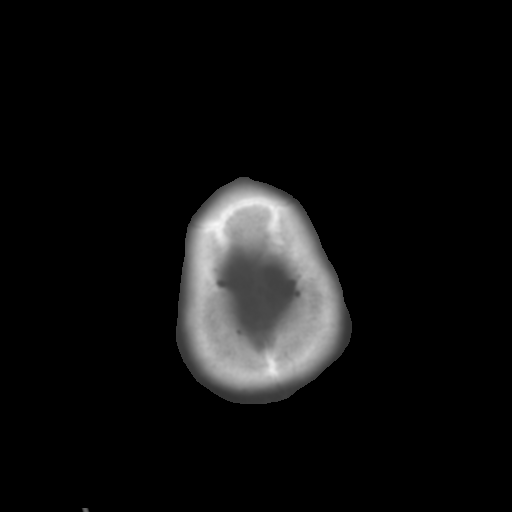

[Series 7: coronal soft tissue · coronal · 0.32mm/px · 3 of 73 slices shown]
[im 25/73  brain]
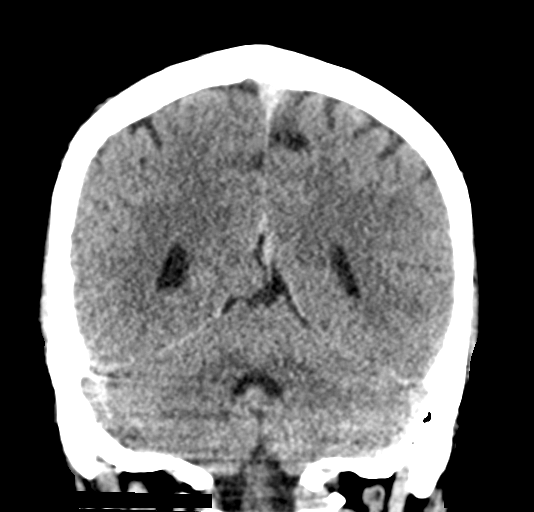
[im 33/73  brain]
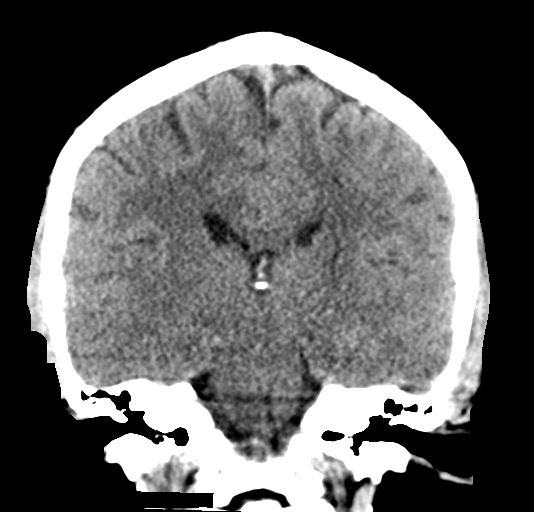
[im 41/73  brain]
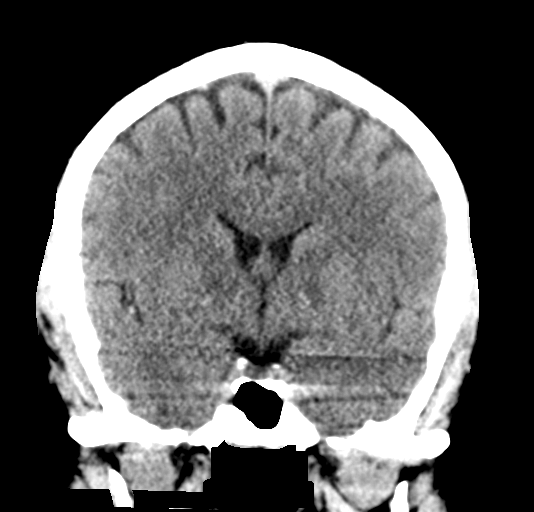

[Series 8: sagittal soft tissue · sagittal · 0.32mm/px · 3 of 58 slices shown]
[im 20/58  brain]
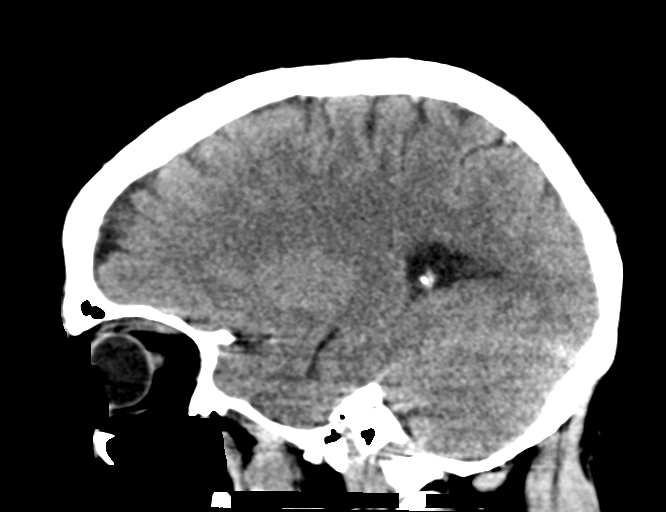
[im 29/58  brain]
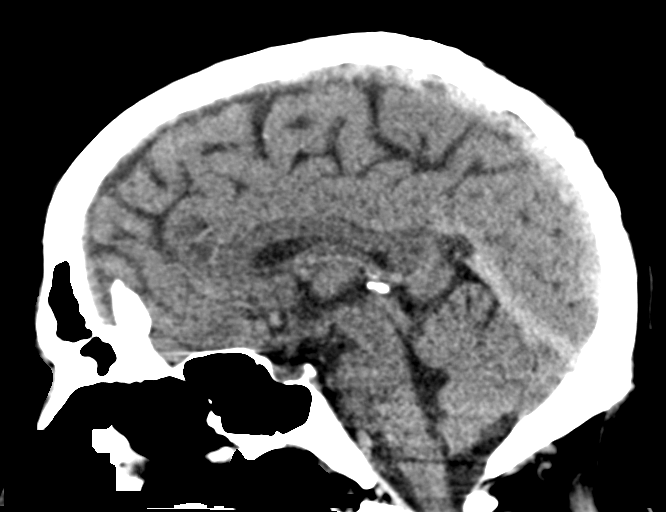
[im 39/58  brain]
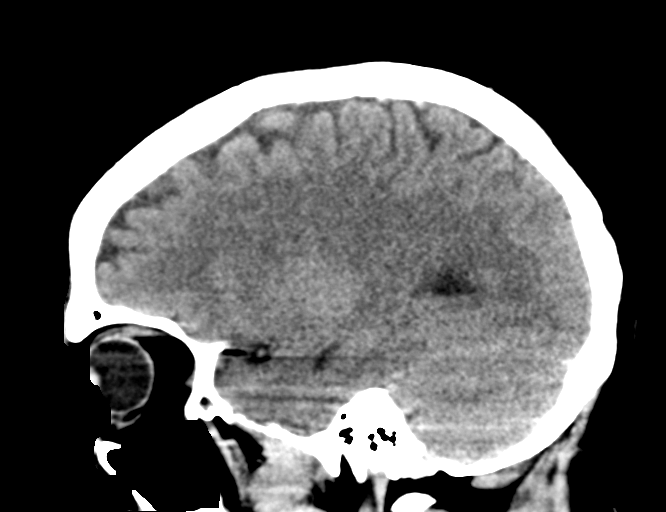

[15 of 47 positions shown; findings below may reference images not displayed]

FINDINGS: CT HEAD FINDINGS

Brain: No evidence of acute infarction, hemorrhage, hydrocephalus,
extra-axial collection or mass lesion/mass effect.

Vascular: No hyperdense vessel or unexpected calcification.

Skull: Normal. Negative for fracture or focal lesion.

Other: None.

CT MAXILLOFACIAL FINDINGS

Osseous: No fracture or mandibular dislocation. No destructive
process.

Orbits: Negative. No traumatic or inflammatory finding.

Sinuses: Clear.

Soft tissues: Negative.

CT CERVICAL SPINE FINDINGS

Alignment: Normal.

Skull base and vertebrae: No acute fracture. No primary bone lesion
or focal pathologic process.

Soft tissues and spinal canal: No prevertebral fluid or swelling. No
visible canal hematoma.

Disc levels:  Mild osteoarthritic changes at C2-C3, C3-C4 and C4-C5.

Upper chest: Negative.

Other: None.
IMPRESSION: 1. No acute intracranial abnormality.
2. No evidence of acute traumatic injury to the cervical spine.
3. No evidence of facial fractures.
4. Mild osteoarthritic changes of the cervical spine.

## 2019-10-27 MED ORDER — CALCIUM GLUCONATE-NACL 1-0.675 GM/50ML-% IV SOLN
1.0000 g | Freq: Once | INTRAVENOUS | Status: AC
  Administered 2019-10-27: 1000 mg via INTRAVENOUS
  Filled 2019-10-27: qty 50

## 2019-10-27 MED ORDER — ALBUTEROL SULFATE (2.5 MG/3ML) 0.083% IN NEBU
10.0000 mg | INHALATION_SOLUTION | Freq: Once | RESPIRATORY_TRACT | Status: AC
  Administered 2019-10-27: 10 mg via RESPIRATORY_TRACT
  Filled 2019-10-27: qty 12

## 2019-10-27 MED ORDER — INSULIN ASPART 100 UNIT/ML IV SOLN
10.0000 [IU] | Freq: Once | INTRAVENOUS | Status: AC
  Administered 2019-10-27: 10 [IU] via INTRAVENOUS
  Filled 2019-10-27: qty 0.1

## 2019-10-27 MED ORDER — DEXTROSE 50 % IV SOLN
1.0000 | Freq: Once | INTRAVENOUS | Status: AC
  Administered 2019-10-27: 50 mL via INTRAVENOUS
  Filled 2019-10-27: qty 50

## 2019-10-27 MED ORDER — FENTANYL CITRATE (PF) 100 MCG/2ML IJ SOLN
50.0000 ug | Freq: Once | INTRAMUSCULAR | Status: AC
  Administered 2019-10-27: 50 ug via INTRAVENOUS
  Filled 2019-10-27: qty 2

## 2019-10-27 MED ORDER — HEPARIN SODIUM (PORCINE) 5000 UNIT/ML IJ SOLN
5000.0000 [IU] | Freq: Three times a day (TID) | INTRAMUSCULAR | Status: DC
  Administered 2019-10-28 – 2019-11-09 (×34): 5000 [IU] via SUBCUTANEOUS
  Filled 2019-10-27 (×35): qty 1

## 2019-10-27 MED ORDER — SENNOSIDES-DOCUSATE SODIUM 8.6-50 MG PO TABS
1.0000 | ORAL_TABLET | Freq: Every evening | ORAL | Status: DC | PRN
  Administered 2019-11-04 – 2019-11-06 (×2): 1 via ORAL
  Filled 2019-10-27 (×2): qty 1

## 2019-10-27 MED ORDER — ONDANSETRON HCL 4 MG PO TABS: 4.0000 mg | ORAL_TABLET | Freq: Four times a day (QID) | ORAL | Status: DC | PRN

## 2019-10-27 MED ORDER — SODIUM ZIRCONIUM CYCLOSILICATE 10 G PO PACK
10.0000 g | PACK | Freq: Once | ORAL | Status: AC
  Administered 2019-10-27: 10 g via ORAL
  Filled 2019-10-27: qty 1

## 2019-10-27 MED ORDER — SODIUM CHLORIDE 0.9 % IV BOLUS
1000.0000 mL | Freq: Once | INTRAVENOUS | Status: AC
  Administered 2019-10-27: 1000 mL via INTRAVENOUS

## 2019-10-27 MED ORDER — SODIUM CHLORIDE 0.9 % IV SOLN: INTRAVENOUS | Status: DC

## 2019-10-27 MED ORDER — HYDROMORPHONE HCL 1 MG/ML IJ SOLN
0.5000 mg | INTRAMUSCULAR | Status: DC | PRN
  Administered 2019-10-28 – 2019-11-09 (×36): 0.5 mg via INTRAVENOUS
  Filled 2019-10-27 (×36): qty 1

## 2019-10-27 MED ORDER — STROKE: EARLY STAGES OF RECOVERY BOOK
Freq: Once | Status: AC
  Filled 2019-10-27 (×2): qty 1

## 2019-10-27 MED ORDER — SODIUM CHLORIDE 0.9 % IV BOLUS
3000.0000 mL | Freq: Once | INTRAVENOUS | Status: AC
  Administered 2019-10-27: 3000 mL via INTRAVENOUS

## 2019-10-27 MED ORDER — ONDANSETRON HCL 4 MG/2ML IJ SOLN: 4.0000 mg | Freq: Four times a day (QID) | INTRAMUSCULAR | Status: DC | PRN

## 2019-10-27 MED ORDER — CALCIUM GLUCONATE 10 % IV SOLN: 1.0000 g | Freq: Once | INTRAVENOUS | Status: DC

## 2019-10-27 NOTE — Consult Note (Addendum)
Renal Service Consult Note [facility]  [name] 10/27/2019 Sol Blazing Requesting Physician:  Dr Vivi Martens  Reason for Consult:  Renal failure, high K+ HPI: The patient is a 49 y.o. year-old w/ hx of Crohn's disease, depression, hx of PE, hx colostomy (2011) and colostomy reversal (2012) presented to ED for decreased LOC. He woke up this am and had pain the R side of his face and bilat LE muscle aches. Was brought in by his wife.  Does MDMA.  BP's were low and pt was given total of about 4 L. Creat up 5.9, K > 7.5 w/ normal ekg. Brain MRI showed watershed pattern perhaps from low BP's, also showed 2 punctate CVA's in the R occipital and R cerebellar territories. AMS improved while in ED.  Pt is for admission to [facility].    In ED pt has rec'd IV Ca gluc, 2m albuterol, IV ins/ glucose and lokelma 10 gm x 1. Also got 4 L NS bolus and IVF's at 200 cc/hr are ordered.  Repeat K+ at 9:44 pm was 7.5.    Pt seen in ED, he is awake and alert and appropriate.  Reports R arm and R leg and R face pain, and painful swelling in the R leg and arm. No voiding issues.  No hx kidney disease.  occ use of nsaids, not heavy use at home.     ROS  denies CP  no joint pain   no HA  no blurry vision  no rash  no diarrhea  no nausea/ vomiting  no dysuria  no difficulty voiding  no change in urine color    Past Medical History  Past Medical History:  Diagnosis Date  . Allergy   [place]   . Depression   . Pulmonary embolism ([facility]    Past Surgical History  Past Surgical History:  Procedure Laterality Date  . COLOSTOMY  March 2011  . Colostomy reversal  January 2012  . HIP SURGERY  R hip surgery    Family History  Family History  Problem Relation Age of Onset  . Colon polyps Father   . Multiple sclerosis Sister   . Colon cancer Neg Hx   . Esophageal cancer Neg Hx   . Stomach cancer Neg Hx   . Pancreatic cancer Neg Hx   . Liver disease Neg Hx    Social  History  reports that he has been smoking. He has been smoking about 0.50 packs per day. He has never used smokeless tobacco. He reports current alcohol use. He reports that he does not use drugs. Allergies No Known Allergies Home medications Prior to Admission medications   Medication Sig Start Date End Date Taking? Authorizing Provider  acetaminophen (TYLENOL) 500 MG tablet Take 1,500 mg by mouth 2 (two) times daily as needed for mild pain or moderate pain.    Yes [provider]  inFLIXimab (REMICADE) 100 MG injection Inject 400 mg into the vein every 8 (eight) weeks.   Yes [provider]  loratadine (CLARITIN) 10 MG tablet Take 10 mg by mouth as needed for allergies.    [provider]  sildenafil (VIAGRA) 50 MG tablet Take 50 mg by mouth daily as needed for erectile dysfunction.    [provider]     Vitals:   10/27/19 1848 10/27/19 2048 10/27/19 2103 10/27/19 2206  BP: (!) 73/60 (!) 77/53 (!) 90/54 100/64  Pulse: (!) 112 (!) 119 (!) 118 (Marland Kitchen  118  Resp: (!) 31 (!) 30 (!) 114 (!) 33  Temp:   98.5 F (36.9 C)   TempSrc:   Oral   SpO2: 95% 94% 94% 94%  Weight:      Height:       Exam Gen alert, no distress , Ox 3 No rash, cyanosis or gangrene Sclera anicteric, throat clear and moist  No jvd or bruits Chest clear bilat to bases, no rales or wheezing RRR no MRG Abd soft ntnd no mass or ascites, lower midline scar well-healed, +bs GU normal male MS no joint effusion R leg and arm tight w/ nonpitting edema, no LUE / LLE swelling / edema Ext no wounds or ulcers Neuro is alert, Ox 3 , nf    Home meds:  - remicade 400 IV every 8 wks  - ciagra 50 prn/ claritin 10 prn  - prn's/ vitamins/ supplements     UDS + cocaine     CT abd 9/04 > Adrenals/Urinary Tract: Both adrenal glands appear normal. The kidneys and collecting system appear normal without evidence of urinary tract calculus or hydronephrosis. Bladder is unremarkable. Otherwise >>  IMPRESSION: small portion of transverse colon and fat containing anterior umbilical hernia, however no definite evidence bowel obstruction or strangulation. Moderate amount of colonic stool. Patchy streaky airspace opacity at the right lung base which may be due to atelectasis and/or infectious etiology.     MRI brain 9/04 > IMPRESSION: 1. Symmetric restricted diffusion in the globi pallidi. This is most often seen with an acute toxic or metabolic insult (including carbon monoxide poisoning and drugs of abuse) or hypoxic-ischemic injury. No hemorrhage. 2. Punctate acute infarcts in the R occipital lobe and R cerebellum.    CXR 10/27/19 - IMPRESSION: Low lung volumes with bibasilar atelectasis. No evidence of acute traumatic injury   Na 147  K + > 7.5  CO2 14  aG 24  BUN 31 Cr 5.26  Glu 96  Ca 6.9 alb 3.9  AST pend/ ALT 1851  Tprot 7.1  Tbili 0.7  eGFR 13   wBC 43K  Hb 19  plt 276     Assessment/ Plan: 1. AKI - suspect rhabdomyolysis w/ swollen R leg/ arm, AKI and high K+ in pt who was unconscious on the ground for too long of a time. CPK is pending.  AKI due to hypotension/ hypovolemia and rhabdomyolysis. Has had 4 L bolus. Will change to bicarb gtt at 175 cc/hr for now. Has room for volume. Renal US and urine lytes ordered. Bladder scan q 12h.  Hopefully will turn around and not require dialysis.  2. Hyperkalemia - K+ 7.5, fortunately EKG is mostly normal. QRS 90 msec at 4 pm today, no bradycardia or peaked T's. Has rec'd all of the ED acute temporizing measures and also Lokelma.  Will avoid kayexalate given his Crohn's history. Cont lokelma at highest dose. If eating he should get a Renal diet, which is the only low K+ diet here. Will reorder Lokelma max dose 10gm tid for now. Repeat K+ every 4- 6 hrs until < 6.0.  3. Drug abuse 4. H/o Crohn's - hx colostomy 2011, reversal in 2012      Rob Eneida Evers  MD 10/27/2019, 10:56 PM  Recent Labs  Lab 10/27/19 1705 10/27/19 2144  WBC 43.5*  --   HGB 19.3*  13.6   Recent Labs  Lab 10/27/19 1852 10/27/19 1852 10/27/19 1953 10/27/19 2144  K >7.5*  --   --  7.5*  BUN 31*  --   --  38*  CREATININE 5.26*   < > 5.90* 5.50*  CALCIUM 6.9*  --   --   --    < > = values in this interval not displayed.

## 2019-10-27 NOTE — ED Provider Notes (Addendum)
[facility] DEPT Provider Note   CSN: [id number] Arrival date & time: 10/27/19  1534     History No chief complaint on file.   [name] is a 49 y.o. male.  49 year old male presents with decreased level of consciousness. Patient states that he took [name] just prior to arrival. States that he was not trying to harm himself. Denies any other coingestions. Does complain of a fall which happened yesterday. States that he is unsure of how to happen but that he woke this morning and had pain to the right side of his face. Also has bilateral lower extremity muscle aches. Denies any blood thinner use. He was brought here by his wife.        Past Medical History:  Diagnosis Date  . Allergy   . Crohn's colitis ([place])   . Depression   . Pulmonary embolism ([facility])     There are no problems to display for this patient.   Past Surgical History:  Procedure Laterality Date  . COLOSTOMY  March 2011  . Colostomy reversal  January 2012  . HIP SURGERY  R hip surgery        Family History  Problem Relation Age of Onset  . Colon polyps Father   . Multiple sclerosis Sister   . Colon cancer Neg Hx   . Esophageal cancer Neg Hx   . Stomach cancer Neg Hx   . Pancreatic cancer Neg Hx   . Liver disease Neg Hx     Social History   Tobacco Use  . Smoking status: Current Every Day Smoker    Packs/day: 0.50    Last attempt to quit: 07/29/2016    Years since quitting: 3.2  . Smokeless tobacco: Never Used  Substance Use Topics  . Alcohol use: Yes    Comment: 1 per day  . Drug use: No    Home Medications Prior to Admission medications   Medication Sig Start Date End Date Taking? Authorizing Provider  acetaminophen (TYLENOL) 500 MG tablet Take 1,500 mg by mouth as needed.    [provider]  inFLIXimab (REMICADE) 100 MG injection Inject 400 mg into the vein every 8 (eight) weeks.    [provider]  loratadine (CLARITIN) 10 MG tablet  Take 10 mg by mouth as needed for allergies.    [provider]  VIAGRA 50 MG tablet Take 1 tablet by mouth as needed. 08/14/16   [provider]    Allergies    Patient has no known allergies.  Review of Systems   Review of Systems  All other systems reviewed and are negative.   Physical Exam Updated Vital Signs There were no vitals taken for this visit.  Physical Exam Vitals and nursing note reviewed.  Constitutional:      General: He is not in acute distress.    Appearance: Normal appearance. He is well-developed. He is not toxic-appearing.  HENT:     Head: Contusion present.   Eyes:     General: Lids are normal.     Conjunctiva/sclera: Conjunctivae normal.     Pupils: Pupils are equal, round, and reactive to light.  Neck:     Thyroid: No thyroid mass.     Trachea: No tracheal deviation.  Cardiovascular:     Rate and Rhythm: Regular rhythm. Tachycardia present.     Heart sounds: Normal heart sounds. No murmur heard.  No gallop.   Pulmonary:     Effort: Pulmonary effort is  normal. No respiratory distress.     Breath sounds: Normal breath sounds. No stridor. No decreased breath sounds, wheezing, rhonchi or rales.  Abdominal:     General: Bowel sounds are normal. There is no distension.     Palpations: Abdomen is soft.     Tenderness: There is no abdominal tenderness. There is no rebound.  Musculoskeletal:        General: No tenderness. Normal range of motion.     Cervical back: Normal range of motion and neck supple.  Skin:    General: Skin is warm and dry.     Findings: No abrasion or rash.  Neurological:     Mental Status: He is oriented to person, place, and time. He is lethargic.     GCS: GCS eye subscore is 4. GCS verbal subscore is 5. GCS motor subscore is 6.     Cranial Nerves: Cranial nerve deficit and facial asymmetry present.     Sensory: No sensory deficit.     Coordination: Finger-Nose-Finger Test abnormal.     Comments: 3rd cranial  nerve involvement on the left eye lid Right upper extremity pronator drift noted. Right-sided facial droop also appreciated.  Speech is normal.  Psychiatric:        Attention and Perception: He is inattentive.        Mood and Affect: Affect is flat.        Speech: Speech is delayed.        Behavior: Behavior is cooperative.     ED Results / Procedures / Treatments   Labs (all labs ordered are listed, but only abnormal results are displayed) Labs Reviewed  ETHANOL  RAPID URINE DRUG SCREEN, HOSP PERFORMED  CBC WITH DIFFERENTIAL/PLATELET  COMPREHENSIVE METABOLIC PANEL  CK  CBG MONITORING, ED    EKG EKG Interpretation  Date/Time:  Saturday October 27 2019 16:11:09 EDT Ventricular Rate:  120 PR Interval:    QRS Duration: 90 QT Interval:  320 QTC Calculation: 453 R Axis:   100 Text Interpretation: Sinus tachycardia Right axis deviation RSR' in V1 or V2, probably normal variant Confirmed by Lacretia Leigh ([id number]) on 10/27/2019 4:51:05 PM   Radiology No results found.  Procedures Procedures (including critical care time)  Medications Ordered in ED Medications - No data to display  ED Course  I have reviewed the triage vital signs and the nursing notes.  Pertinent labs & imaging results that were available during my care of the patient were reviewed by me and considered in my medical decision making (see chart for details).    MDM Rules/Calculators/A&P                          Given IV fluids here for hypotension.  Given a total of 4 L.  Lactate noted but patient is afebrile and feel that this is from decreased volume as opposed to sepsis.  Creatinine shows acute kidney injury with a value of 5.9.  Potassium greater than 7.5 and this was treated with Lokelma, insulin, glucose, albuterol.  Patient abdominal CT for possible trauma in this was negative for internal hemorrhage.  Patient is brain MRI consistent with acute infarcts likely from watershed from decreased blood  pressure.  This was discussed with Dr. Leonel Ramsay from neurology who recommends that patient be [facility] to [facility] for further stroke work-up.  Patient's labs also significant for transaminitis with ALT of 1851.  Also has significant white count of 43,000 with a  hemoglobin of 19.3.  This is likely hemoconcentration.  Lactate elevated at 6.8.  This is likely from patient's hypotension.  CK still pending at this time.  Patient's mental status improved while here in the department.  Concern that patient has rhabdomyolysis.  Patient is renal status discussed with Dr. Burnett Sheng and he will see the patient in consultation.  Will consult hospitalist for admission  CRITICAL CARE Performed by: Leota Jacobsen Total critical care time: 80 minutes Critical care time was exclusive of separately billable procedures and treating other patients. Critical care was necessary to treat or prevent imminent or life-threatening deterioration. Critical care was time spent personally by me on the following activities: development of treatment plan with patient and/or surrogate as well as nursing, discussions with consultants, evaluation of patient's response to treatment, examination of patient, obtaining history from patient or surrogate, ordering and performing treatments and interventions, ordering and review of laboratory studies, ordering and review of radiographic studies, pulse oximetry and re-evaluation of patient's condition. Final Clinical Impression(s) / ED Diagnoses Final diagnoses:  None    Rx / DC Orders ED Discharge Orders    None       Lacretia Leigh, MD 10/27/19 2151    Lacretia Leigh, MD 10/27/19 2159

## 2019-10-27 NOTE — H&P (Addendum)
History and Physical    [name] IZT:[id number] DOB: 11/06/1970 DOA: 10/27/2019  PCP: Lavone Orn, MD  Patient coming from: Home  I have personally briefly reviewed patient's old medical records in [place]  Chief Complaint: Decreased level of consciousness, right-sided weakness and pain  HPI: [name] is a 49 y.o. male with medical history significant for Crohn's disease, PE not currently on anticoagulation, and depression who presents to the ED for evaluation of right upper and lower extremity weakness and pain.  Patient states he was out drinking last night (10/26/2019) when he got intoxicated on alcohol and also used Halley.  He blacked out and did not recall the events occurring later that night.  He apparently fell onto a brick floor kitchen at his friend's residence landing on the right side of his body and the right side of his face.  He woke up this morning unable to move his right arm or leg and had significant pain throughout his right upper and lower extremities.  He was brought to the ED by his significant other.  Patient reports history of Crohn's disease for which he takes Remicade.  He had a prior abdominal surgery in 2011 for his Crohn's disease.  He says he had a postoperative pulmonary embolus but is no longer on anticoagulation.  He denies using any other medications regularly.  ED Course:  Initial vitals showed BP 63/40, pulse 115, RR 29, temp 97.7 Fahrenheit, SPO2 95% on room air.  Labs significant for potassium >7.5, BUN 31, creatinine 5.26, sodium 147, bicarb 14, serum glucose 96, ALT 1851, AST pending, alk phos 83, total bilirubin 0.7, anion gap 24, WBC 43.5, hemoglobin 19.3, hematocrit 56.4, platelets 276,000, serum ethanol elevated at 24, lactic acid 6.8, CK pending (per nurse, lab needing to dilute several times).  Blood cultures were obtained and pending. SARS-CoV-2 PCR is collected and pending. UDS is positive for cocaine.  CT  head/maxillofacial/cervical spine without contrast were negative for acute intracranial abnormality, acute traumatic injury to the cervical spine, no evidence of facial fractures. Mild osteoarthritic changes of the cervical spine noted.  Portable chest x-ray showed low lung volumes with bibasilar atelectasis, no evidence of pneumothorax, focal consolidation. Right anterior fourth rib fracture is seen and appears remote.  Right ankle x-ray is negative for acute fracture or dislocation.  Right hip x-ray is negative for acute fracture or subluxation of the pelvis or right hip.  MRI brain without contrast shows symmetric restricted diffusion in the globi pallidi, per radiology read most often seen with acute toxic or metabolic insult or hypoxic-ischemic injury. No hemorrhage seen. Punctate acute infarcts in the right septal lobe and right cerebellum are seen. Carboxyhemoglobin was obtained and was 0.4%.  CT abdomen/pelvis without contrast shows small portion of transverse colon and fat-containing anterior umbilical hernia without definite evidence of bowel obstruction or strangulation. Moderate amount of colonic stool seen. Patchy streaky airspace opacity at the right lung base is seen.  Patient was given 4 L normal saline with improvement in blood pressures. EDP discussed with on-call neurology who recommended admission to [facility] for further stroke work-up of possible watershed stroke. EDP also discussed with on-call nephrology who also recommended admission to [facility] but did not feel emergent hemodialysis needed at this time with expected improvement with continued IV fluid resuscitation.  Patient was given 10 units NovoLog with 1 amp D50, Lokelma, calcium gluconate, 10 mg albuterol nebulizer treatment, and 50 mcg IV fentanyl.  The hospitalist service  was consulted to admit for further evaluation and management.  Review of Systems: All systems reviewed and are negative except as documented in  history of present illness above.   Past Medical History:  Diagnosis Date  . Allergy   . Crohn's colitis ([place])   . Depression   . Pulmonary embolism [facility])     Past Surgical History:  Procedure Laterality Date  . COLOSTOMY  March 2011  . Colostomy reversal  January 2012  . HIP SURGERY  R hip surgery     Social History:  reports that he has been smoking. He has been smoking about 0.50 packs per day. He has never used smokeless tobacco. He reports current alcohol use. He reports that he does not use drugs.  No Known Allergies  Family History  Problem Relation Age of Onset  . Colon polyps Father   . Multiple sclerosis Sister   . Colon cancer Neg Hx   . Esophageal cancer Neg Hx   . Stomach cancer Neg Hx   . Pancreatic cancer Neg Hx   . Liver disease Neg Hx      Prior to Admission medications   Medication Sig Start Date End Date Taking? Authorizing Provider  acetaminophen (TYLENOL) 500 MG tablet Take 1,500 mg by mouth 2 (two) times daily as needed for mild pain or moderate pain.    Yes [provider]  inFLIXimab (REMICADE) 100 MG injection Inject 400 mg into the vein every 8 (eight) weeks.   Yes [provider]  loratadine (CLARITIN) 10 MG tablet Take 10 mg by mouth as needed for allergies.    [provider]  sildenafil (VIAGRA) 50 MG tablet Take 50 mg by mouth daily as needed for erectile dysfunction.    [provider]    Physical Exam: Vitals:   10/27/19 2315 10/27/19 2330 10/27/19 2345 10/28/19 0000  BP: (!) 97/56 95/66 108/63 113/64  Pulse:   (!) 122 (!) 125  Resp: (!) 29 (!) 27 (!) 28 (!) 29  Temp:      TempSrc:      SpO2:   93% 93%  Weight:      Height:       Constitutional: Laying in bed with head elevated, appears tired but is awaking communicating appropriately. Eyes: PERRL, lids and conjunctivae normal.  Early bruising and swelling below the right eye. ENMT: Mucous membranes are dry. Posterior pharynx clear of any  exudate or lesions.Normal dentition.  Neck: normal, supple, no masses. Respiratory: clear to auscultation bilaterally, no wheezing, no crackles. Normal respiratory effort. No accessory muscle use.  Cardiovascular: Tachycardic, no murmurs / rubs / gallops. No extremity edema.  Unable to palpate right DP pulse.  Left DP pulse intact.  Radial pulse intact bilaterally. Abdomen: Midline surgical scar present.  No tenderness, no masses palpated.  Diminished bowel sounds. Musculoskeletal: ROM diminished right upper and lower extremities.  Unable to move right lower extremity at all including wiggling his toes.  Able to lift right arm off bed against gravity. Skin: Early bruise with swelling below right eye. Neurologic: CN 2-12 grossly intact. Sensation intact, strength 0/5 right lower extremity, 2/5 right upper extremity.  Decreased grip strength right hand.  Strength 5/5 left upper and lower extremities. Psychiatric: Alert and oriented x 3. Normal mood.   Labs on Admission: I have personally reviewed following labs and imaging studies  CBC: Recent Labs  Lab 10/27/19 1705 10/27/19 2144  WBC 43.5*  --   NEUTROABS 37.4*  --  HGB 19.3* 13.6  HCT 56.4* 40.0  MCV 91.6  --   PLT 276  --    Basic Metabolic Panel: Recent Labs  Lab 10/27/19 1852 10/27/19 1953 10/27/19 2144  NA 147*  --  140  K >7.5*  --  7.5*  CL 109  --  114*  CO2 14*  --   --   GLUCOSE 96  --  102*  BUN 31*  --  38*  CREATININE 5.26* 5.90* 5.50*  CALCIUM 6.9*  --   --    GFR: Estimated Creatinine Clearance: 19.4 mL/min (A) (by C-G formula based on SCr of 5.5 mg/dL (H)). Liver Function Tests: Recent Labs  Lab 10/27/19 1852  AST 4,337*  ALT 1,851*  ALKPHOS 83  BILITOT 0.7  PROT 7.1  ALBUMIN 3.9   No results for input(s): LIPASE, AMYLASE in the last 168 hours. No results for input(s): AMMONIA in the last 168 hours. Coagulation Profile: No results for input(s): INR, PROTIME in the last 168 hours. Cardiac  Enzymes: Recent Labs  Lab 10/27/19 1852  CKTOTAL >50,000*   BNP (last 3 results) No results for input(s): PROBNP in the last 8760 hours. HbA1C: No results for input(s): HGBA1C in the last 72 hours. CBG: Recent Labs  Lab 10/27/19 1600 10/27/19 2259  GLUCAP 97 174*   Lipid Profile: No results for input(s): CHOL, HDL, LDLCALC, TRIG, CHOLHDL, LDLDIRECT in the last 72 hours. Thyroid Function Tests: No results for input(s): TSH, T4TOTAL, FREET4, T3FREE, THYROIDAB in the last 72 hours. Anemia Panel: No results for input(s): VITAMINB12, FOLATE, FERRITIN, TIBC, IRON, RETICCTPCT in the last 72 hours. Urine analysis:    Component Value Date/Time   COLORURINE STRAW (A) 10/27/2019 2249   APPEARANCEUR CLEAR 10/27/2019 2249   LABSPEC 1.003 (L) 10/27/2019 2249   PHURINE 6.0 10/27/2019 2249   GLUCOSEU NEGATIVE 10/27/2019 2249   HGBUR MODERATE (A) 10/27/2019 2249   BILIRUBINUR NEGATIVE 10/27/2019 2249   KETONESUR NEGATIVE 10/27/2019 2249   PROTEINUR NEGATIVE 10/27/2019 2249   UROBILINOGEN 0.2 03/23/2010 1245   NITRITE NEGATIVE 10/27/2019 2249   LEUKOCYTESUR NEGATIVE 10/27/2019 2249    Radiological Exams on Admission: CT Abdomen Pelvis Wo Contrast  Result Date: 10/27/2019 CLINICAL DATA:  Abdominal distension and pain EXAM: CT ABDOMEN AND PELVIS WITHOUT CONTRAST TECHNIQUE: Multidetector CT imaging of the abdomen and pelvis was performed following the standard protocol without IV contrast. COMPARISON:  None. FINDINGS: Lower chest: The visualized heart size within normal limits. No pericardial fluid/thickening. No hiatal hernia. Mild streaky patchy airspace opacity seen at the right lung base. Hepatobiliary: Although limited due to the lack of intravenous contrast, normal in appearance without gross focal abnormality. No evidence of calcified gallstones or biliary ductal dilatation. Pancreas:  Unremarkable.  No surrounding inflammatory changes. Spleen: Normal in size. Although limited due to the  lack of intravenous contrast, normal in appearance. Adrenals/Urinary Tract: Both adrenal glands appear normal. The kidneys and collecting system appear normal without evidence of urinary tract calculus or hydronephrosis. Bladder is unremarkable. Stomach/Bowel: The stomach, small bowel, are normal in appearance. There is a small anterior fat and tiny portion of transverse colon containing hernia seen along the anterior abdominal wall. A surgical anastomosis seen within the left distal colon. No areas of focal wall thickening or bowel dilatation are seen. There is a moderate amount of colonic stool present. Vascular/Lymphatic: There are no enlarged abdominal or pelvic lymph nodes. Scattered mild aortic atherosclerosis is seen. Reproductive: The prostate is unremarkable. Other: No evidence of abdominal  wall mass or hernia. Musculoskeletal: No acute or significant osseous findings. Heterotopic ossifications are seen around the right hip. IMPRESSION: Small portion of transverse colon and fat containing anterior umbilical hernia, however no definite evidence bowel obstruction or strangulation. Moderate amount of colonic stool. Patchy streaky airspace opacity at the right lung base which may be due to atelectasis and/or infectious etiology. Aortic Atherosclerosis (ICD10-I70.0). Electronically Signed   By: Prudencio Pair M.D.   On: 10/27/2019 20:50   DG Ankle 2 Views Right  Result Date: 10/27/2019 CLINICAL DATA:  Trauma, fall.  Right ankle pain. EXAM: RIGHT ANKLE - 2 VIEW COMPARISON:  None. FINDINGS: No acute fracture or dislocation. There is slight lateral talar tilt. No mortise widening. No ankle joint effusion. No focal soft tissue abnormality. IMPRESSION: No acute fracture or dislocation. Slight lateral talar tilt. Electronically Signed   By: Keith Rake M.D.   On: 10/27/2019 16:59   CT Head Wo Contrast  Result Date: 10/27/2019 CLINICAL DATA:  Post injury, syncopal episode and fall. EXAM: CT HEAD WITHOUT  CONTRAST CT MAXILLOFACIAL WITHOUT CONTRAST CT CERVICAL SPINE WITHOUT CONTRAST TECHNIQUE: Multidetector CT imaging of the head, cervical spine, and maxillofacial structures were performed using the standard protocol without intravenous contrast. Multiplanar CT image reconstructions of the cervical spine and maxillofacial structures were also generated. COMPARISON:  None. FINDINGS: CT HEAD FINDINGS Brain: No evidence of acute infarction, hemorrhage, hydrocephalus, extra-axial collection or mass lesion/mass effect. Vascular: No hyperdense vessel or unexpected calcification. Skull: Normal. Negative for fracture or focal lesion. Other: None. CT MAXILLOFACIAL FINDINGS Osseous: No fracture or mandibular dislocation. No destructive process. Orbits: Negative. No traumatic or inflammatory finding. Sinuses: Clear. Soft tissues: Negative. CT CERVICAL SPINE FINDINGS Alignment: Normal. Skull base and vertebrae: No acute fracture. No primary bone lesion or focal pathologic process. Soft tissues and spinal canal: No prevertebral fluid or swelling. No visible canal hematoma. Disc levels:  Mild osteoarthritic changes at C2-C3, C3-C4 and C4-C5. Upper chest: Negative. Other: None. IMPRESSION: 1. No acute intracranial abnormality. 2. No evidence of acute traumatic injury to the cervical spine. 3. No evidence of facial fractures. 4. Mild osteoarthritic changes of the cervical spine. Electronically Signed   By: Fidela Salisbury M.D.   On: 10/27/2019 16:51   CT Cervical Spine Wo Contrast  Result Date: 10/27/2019 CLINICAL DATA:  Post injury, syncopal episode and fall. EXAM: CT HEAD WITHOUT CONTRAST CT MAXILLOFACIAL WITHOUT CONTRAST CT CERVICAL SPINE WITHOUT CONTRAST TECHNIQUE: Multidetector CT imaging of the head, cervical spine, and maxillofacial structures were performed using the standard protocol without intravenous contrast. Multiplanar CT image reconstructions of the cervical spine and maxillofacial structures were also  generated. COMPARISON:  None. FINDINGS: CT HEAD FINDINGS Brain: No evidence of acute infarction, hemorrhage, hydrocephalus, extra-axial collection or mass lesion/mass effect. Vascular: No hyperdense vessel or unexpected calcification. Skull: Normal. Negative for fracture or focal lesion. Other: None. CT MAXILLOFACIAL FINDINGS Osseous: No fracture or mandibular dislocation. No destructive process. Orbits: Negative. No traumatic or inflammatory finding. Sinuses: Clear. Soft tissues: Negative. CT CERVICAL SPINE FINDINGS Alignment: Normal. Skull base and vertebrae: No acute fracture. No primary bone lesion or focal pathologic process. Soft tissues and spinal canal: No prevertebral fluid or swelling. No visible canal hematoma. Disc levels:  Mild osteoarthritic changes at C2-C3, C3-C4 and C4-C5. Upper chest: Negative. Other: None. IMPRESSION: 1. No acute intracranial abnormality. 2. No evidence of acute traumatic injury to the cervical spine. 3. No evidence of facial fractures. 4. Mild osteoarthritic changes of the cervical spine. Electronically Signed  By: Fidela Salisbury M.D.   On: 10/27/2019 16:51   MR Brain Wo Contrast (neuro protocol)  Result Date: 10/27/2019 CLINICAL DATA:  Syncope.  Right-sided weakness. EXAM: MRI HEAD WITHOUT CONTRAST TECHNIQUE: Multiplanar, multiecho pulse sequences of the brain and surrounding structures were obtained without intravenous contrast. COMPARISON:  Head CT 10/27/2019 FINDINGS: Brain: There is symmetric restricted diffusion and T2 hyperintensity in the globi pallidi. There are also 2 subcentimeter foci of restricted diffusion in the right occipital lobe and a single punctate focus of restricted diffusion in the right cerebellum. The brain is normal in signal elsewhere. No intracranial hemorrhage, mass, midline shift, or extra-axial fluid collection is identified. The ventricles and sulci are normal. Vascular: Major intracranial vascular flow voids are preserved. Skull and  upper cervical spine: Unremarkable bone marrow signal. Sinuses/Orbits: Unremarkable orbits. Paranasal sinuses and mastoid air cells are clear. Other: None. IMPRESSION: 1. Symmetric restricted diffusion in the globi pallidi. This is most often seen with an acute toxic or metabolic insult (including carbon monoxide poisoning and drugs of abuse) or hypoxic-ischemic injury. No hemorrhage. 2. Punctate acute infarcts in the right occipital lobe and right cerebellum. Electronically Signed   By: Logan Bores M.D.   On: 10/27/2019 19:01   DG Chest Port 1 View  Result Date: 10/27/2019 CLINICAL DATA:  Trauma.  Fall onto right side. EXAM: PORTABLE CHEST 1 VIEW COMPARISON:  Chest radiograph 03/06/2010, no interval imaging available. FINDINGS: Lung volumes are low. Mild elevation of right hemidiaphragm. Upper normal heart size likely accentuated by technique. No evidence of pneumothorax. Streaky opacities at the right greater than left lung base typical of atelectasis. No acute osseous abnormalities are seen. Right anterior fourth rib fracture appears remote. IMPRESSION: Low lung volumes with bibasilar atelectasis. No evidence of acute traumatic injury. Electronically Signed   By: Keith Rake M.D.   On: 10/27/2019 16:55   DG HIP UNILAT WITH PELVIS 2-3 VIEWS RIGHT  Result Date: 10/27/2019 CLINICAL DATA:  Fall onto right side.  History of right hip surgery. EXAM: DG HIP (WITH OR WITHOUT PELVIS) 2-3V RIGHT COMPARISON:  Remote radiograph 06/28/2009 FINDINGS: No evidence of acute fracture. There is heterotopic ossification about the right hip. This appears increased from prior exam. Femoral head is seated in the acetabulum. Pubic rami are intact. Pubic symphysis and sacroiliac joints are congruent. There is slight flattening of the left femoral head neck junction that is unchanged from prior. IMPRESSION: No acute fracture or subluxation of the pelvis or right hip. Electronically Signed   By: Keith Rake M.D.   On:  10/27/2019 16:57   CT Maxillofacial WO CM  Result Date: 10/27/2019 CLINICAL DATA:  Post injury, syncopal episode and fall. EXAM: CT HEAD WITHOUT CONTRAST CT MAXILLOFACIAL WITHOUT CONTRAST CT CERVICAL SPINE WITHOUT CONTRAST TECHNIQUE: Multidetector CT imaging of the head, cervical spine, and maxillofacial structures were performed using the standard protocol without intravenous contrast. Multiplanar CT image reconstructions of the cervical spine and maxillofacial structures were also generated. COMPARISON:  None. FINDINGS: CT HEAD FINDINGS Brain: No evidence of acute infarction, hemorrhage, hydrocephalus, extra-axial collection or mass lesion/mass effect. Vascular: No hyperdense vessel or unexpected calcification. Skull: Normal. Negative for fracture or focal lesion. Other: None. CT MAXILLOFACIAL FINDINGS Osseous: No fracture or mandibular dislocation. No destructive process. Orbits: Negative. No traumatic or inflammatory finding. Sinuses: Clear. Soft tissues: Negative. CT CERVICAL SPINE FINDINGS Alignment: Normal. Skull base and vertebrae: No acute fracture. No primary bone lesion or focal pathologic process. Soft tissues and spinal canal: No prevertebral  fluid or swelling. No visible canal hematoma. Disc levels:  Mild osteoarthritic changes at C2-C3, C3-C4 and C4-C5. Upper chest: Negative. Other: None. IMPRESSION: 1. No acute intracranial abnormality. 2. No evidence of acute traumatic injury to the cervical spine. 3. No evidence of facial fractures. 4. Mild osteoarthritic changes of the cervical spine. Electronically Signed   By: Fidela Salisbury M.D.   On: 10/27/2019 16:51    EKG: Independently reviewed. Sinus tachycardia, rate 120, peak T waves present. No prior for comparison.  Assessment/Plan Principal Problem:   Acute renal failure due to rhabdomyolysis [facility]) Active Problems:   Hyperkalemia   Transaminitis   High anion gap metabolic acidosis   Alcohol use   Substance use disorder   Acute  cerebral infarction (HCC)   Hypotension   Crohn's disease ([facility])   Lactic acidosis  [name] is a 49 y.o. male with medical history significant for Crohn's disease, PE not currently on anticoagulation, and depression who is admitted with rhabdomyolysis associated with acute renal failure, hyperkalemia, and right sided weakness.   Rhabdomyolysis with acute renal failure: Occurring after prolonged downtime on the right side.  CK pending, likely >50,000 as lab is needing to dilute multiple times.  He has made small amount of urine while in the ED.  Has received 4 L normal saline so far. -Nephrology consulted, appreciate further assistance -Continue IVF with sodium bicarb per nephrology -Obtain urinalysis -Monitor strict intake and output -Follow and trend CK -Check renal function panel in a.m. -Renal ultrasound ordered and pending  Hyperkalemia: Secondary to above.  EKG does show peaked T wave changes.  Patient given IV calcium gluconate, insulin/D50, Lokelma, and 10 mg albuterol nebulizer treatment.  Has received 4 L normal saline as well.  Repeat K was 7.5 however was obtained before temporizing measures were given. -Continue IV fluids as above -Repeat BMP now and again in a.m. -Monitor on telemetry -Placed on additional Lokelma 10 g tid per nephrology  Right upper and lower extremity weakness/edema/pain: Strength is absent at his right lower extremity.  Doubt related to CVA as MRI shows right-sided infarcts.  I discussed with on-call orthopedist, Dr. Stann Mainland, who has seen the patient and felt this was less likely acute compartment syndrome but rather nerve compression/palsy. -Orthopedics consulted, appreciate assistance -Continue IV fluid resuscitation as above -Pain control with IV Dilaudid 0.5 mg q4h only as needed with hold parameters -No NSAIDs or Tylenol with renal and liver function abnormalities -PT/OT  Acute infarction the right occipital lobe and right  cerebellum/watershed stroke: MRI brain showing punctate acute infarcts in the right subdural lobe and right cerebellum, suspect watershed stroke in the setting of hypotension.  Right-sided weakness likely related to rhabdo/compartment syndrome rather than CVA. -Neurology consulted, appreciate further recommendations -Will defer further imaging studies to neurology at this time -Continue neurochecks -PT/OT eval  Hypotension: Improving after initial IV fluid resuscitation.  Continue IV fluids as above.  Transaminitis: AST 4337, ALT 1851.  Multifactorial from rhabdomyolysis, hypoperfusion due to hypotension, and alcohol use.  He is currently mentating well. -Check acetaminophen level -Repeat hepatic function panel in a.m. -Continue IV fluid hydration and management as above  High anion gap metabolic acidosis/lactic acidosis: Due to rhabdomyolysis/acute renal failure.  Continue IV fluids and management as above.  Repeat lactic acid pending.  Leukocytosis: Likely reactive from rhabdomyolysis in addition to hemoconcentration.  Crohn's disease: Follows with [facility], Dr. Havery Moros.  Has been on Remicade monotherapy.  Currently stable.  Hold Remicade.  Alcohol use: Denies  any prior history of withdrawal.  Continue CIWA checks q4h.  Will not place on standing Ativan at this time given multiple medical complications as above.  Substance use disorder: UDS positive for cocaine. Patient also reports using Molly.  DVT prophylaxis: Subcutaneous heparin Code Status: Full code, confirmed with patient Family Communication: Discussed with patient, he has discussed with family/significant other Disposition Plan: From home, discharge pending progress of renal failure and nephrology evaluation, correction of hyperkalemia, neurology eval for stroke work-up, orthopedics evaluation of compartment syndrome and functional progress of musculoskeletal weakness, PT/OT eval. Consults called: Nephrology,  neurology, orthopedics Admission status:  Status is: Inpatient  Remains inpatient appropriate because:Hemodynamically unstable, Persistent severe electrolyte disturbances, Ongoing active pain requiring inpatient pain management, Unsafe d/c plan, IV treatments appropriate due to intensity of illness or inability to take PO and Inpatient level of care appropriate due to severity of illness   Dispo: The patient is from: Home              Anticipated d/c is to: TBD pending further work-up/management as above and gain of function of right-sided extremities.              Anticipated d/c date is: > 3 days              Patient currently is not medically stable to d/c.   Zada Finders MD [facility]  If 7PM-7AM, please contact night-coverage [email or web address]  10/28/2019, 12:52 AM

## 2019-10-27 NOTE — ED Notes (Signed)
2L of NS admin initiated at this time per MD verbal bedside order.

## 2019-10-27 NOTE — ED Notes (Signed)
MRI called @ 17:25 .
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
## 2019-10-27 NOTE — ED Notes (Signed)
Pt transported to MRI at this time via stretcher with MRI tech.

## 2019-10-28 ENCOUNTER — Encounter (HOSPITAL_COMMUNITY): Payer: Self-pay | Admitting: Internal Medicine

## 2019-10-28 ENCOUNTER — Inpatient Hospital Stay (HOSPITAL_COMMUNITY): Payer: BC Managed Care – PPO

## 2019-10-28 DIAGNOSIS — I639 Cerebral infarction, unspecified: Secondary | ICD-10-CM

## 2019-10-28 DIAGNOSIS — G259 Extrapyramidal and movement disorder, unspecified: Secondary | ICD-10-CM

## 2019-10-28 LAB — CBC
HCT: 42 % (ref 39.0–52.0)
Hemoglobin: 13.9 g/dL (ref 13.0–17.0)
MCH: 31.3 pg (ref 26.0–34.0)
MCHC: 33.1 g/dL (ref 30.0–36.0)
MCV: 94.6 fL (ref 80.0–100.0)
Platelets: 148 10*3/uL — ABNORMAL LOW (ref 150–400)
RBC: 4.44 MIL/uL (ref 4.22–5.81)
RDW: 13.4 % (ref 11.5–15.5)
WBC: 25.2 10*3/uL — ABNORMAL HIGH (ref 4.0–10.5)
nRBC: 0 % (ref 0.0–0.2)

## 2019-10-28 LAB — SODIUM, URINE, RANDOM: Sodium, Ur: 11 mmol/L

## 2019-10-28 LAB — RENAL FUNCTION PANEL
Albumin: 3.1 g/dL — ABNORMAL LOW (ref 3.5–5.0)
Anion gap: 16 — ABNORMAL HIGH (ref 5–15)
BUN: 46 mg/dL — ABNORMAL HIGH (ref 6–20)
CO2: 15 mmol/L — ABNORMAL LOW (ref 22–32)
Calcium: 5.9 mg/dL — CL (ref 8.9–10.3)
Chloride: 106 mmol/L (ref 98–111)
Creatinine, Ser: 6.06 mg/dL — ABNORMAL HIGH (ref 0.61–1.24)
GFR calc Af Amer: 12 mL/min — ABNORMAL LOW (ref >60)
GFR calc non Af Amer: 10 mL/min — ABNORMAL LOW (ref >60)
Glucose, Bld: 143 mg/dL — ABNORMAL HIGH (ref 70–99)
Phosphorus: 10.1 mg/dL — ABNORMAL HIGH (ref 2.5–4.6)
Potassium: 5 mmol/L (ref 3.5–5.1)
Sodium: 137 mmol/L (ref 135–145)

## 2019-10-28 LAB — HEPATIC FUNCTION PANEL
ALT: 3179 U/L — ABNORMAL HIGH (ref 0–44)
AST: 9139 U/L — ABNORMAL HIGH (ref 15–41)
Albumin: 3 g/dL — ABNORMAL LOW (ref 3.5–5.0)
Alkaline Phosphatase: 72 U/L (ref 38–126)
Bilirubin, Direct: 0.2 mg/dL (ref 0.0–0.2)
Indirect Bilirubin: 0.7 mg/dL (ref 0.3–0.9)
Total Bilirubin: 0.9 mg/dL (ref 0.3–1.2)
Total Protein: 5.5 g/dL — ABNORMAL LOW (ref 6.5–8.1)

## 2019-10-28 LAB — BASIC METABOLIC PANEL
Anion gap: 16 — ABNORMAL HIGH (ref 5–15)
Anion gap: 19 — ABNORMAL HIGH (ref 5–15)
BUN: 37 mg/dL — ABNORMAL HIGH (ref 6–20)
BUN: 52 mg/dL — ABNORMAL HIGH (ref 6–20)
CO2: 13 mmol/L — ABNORMAL LOW (ref 22–32)
CO2: 16 mmol/L — ABNORMAL LOW (ref 22–32)
Calcium: 5.8 mg/dL — CL (ref 8.9–10.3)
Calcium: 5.9 mg/dL — CL (ref 8.9–10.3)
Chloride: 110 mmol/L (ref 98–111)
Chloride: 100 mmol/L (ref 98–111)
Creatinine, Ser: 5.46 mg/dL — ABNORMAL HIGH (ref 0.61–1.24)
Creatinine, Ser: 7.26 mg/dL — ABNORMAL HIGH (ref 0.61–1.24)
GFR calc Af Amer: 13 mL/min — ABNORMAL LOW (ref >60)
GFR calc Af Amer: 9 mL/min — ABNORMAL LOW (ref >60)
GFR calc non Af Amer: 11 mL/min — ABNORMAL LOW (ref >60)
GFR calc non Af Amer: 8 mL/min — ABNORMAL LOW (ref >60)
Glucose, Bld: 137 mg/dL — ABNORMAL HIGH (ref 70–99)
Glucose, Bld: 133 mg/dL — ABNORMAL HIGH (ref 70–99)
Potassium: 7 mmol/L (ref 3.5–5.1)
Potassium: 4.6 mmol/L (ref 3.5–5.1)
Sodium: 139 mmol/L (ref 135–145)
Sodium: 135 mmol/L (ref 135–145)

## 2019-10-28 LAB — HEMOGLOBIN A1C
Hgb A1c MFr Bld: 5.1 % (ref 4.8–5.6)
Mean Plasma Glucose: 99.67 mg/dL

## 2019-10-28 LAB — LIPID PANEL
Cholesterol: 182 mg/dL (ref 0–200)
HDL: 20 mg/dL — ABNORMAL LOW (ref >40)
LDL Cholesterol: UNDETERMINED mg/dL (ref 0–99)
Total CHOL/HDL Ratio: 9.1 RATIO
Triglycerides: 462 mg/dL — ABNORMAL HIGH (ref <150)
VLDL: UNDETERMINED mg/dL (ref 0–40)

## 2019-10-28 LAB — CREATININE, URINE, RANDOM: Creatinine, Urine: 56.68 mg/dL

## 2019-10-28 LAB — HEPATITIS C ANTIBODY: HCV Ab: NONREACTIVE

## 2019-10-28 LAB — LACTIC ACID, PLASMA
Lactic Acid, Venous: 5.4 mmol/L (ref 0.5–1.9)
Lactic Acid, Venous: 3.2 mmol/L (ref 0.5–1.9)

## 2019-10-28 LAB — HEPATITIS B CORE ANTIBODY, IGM: Hep B C IgM: NONREACTIVE

## 2019-10-28 LAB — CK
Total CK: >50000 U/L — ABNORMAL HIGH (ref 49–397)
Total CK: >50000 U/L — ABNORMAL HIGH (ref 49–397)

## 2019-10-28 LAB — HEPATITIS B SURFACE ANTIGEN: Hepatitis B Surface Ag: NONREACTIVE

## 2019-10-28 LAB — ACETAMINOPHEN LEVEL: Acetaminophen (Tylenol), Serum: <10 ug/mL — ABNORMAL LOW (ref 10–30)

## 2019-10-28 LAB — HIV ANTIBODY (ROUTINE TESTING W REFLEX): HIV Screen 4th Generation wRfx: NONREACTIVE

## 2019-10-28 IMAGING — US US RENAL
1 series · 14 of 25 positions shown · non-contrast
Comparison: CT abdomen/pelvis dated [DATE]

CLINICAL DATA: Acute kidney injury

EXAM:
RENAL / URINARY TRACT ULTRASOUND COMPLETE

[Series 1: us renal · 14 of 50 slices shown]
[im 1/50]
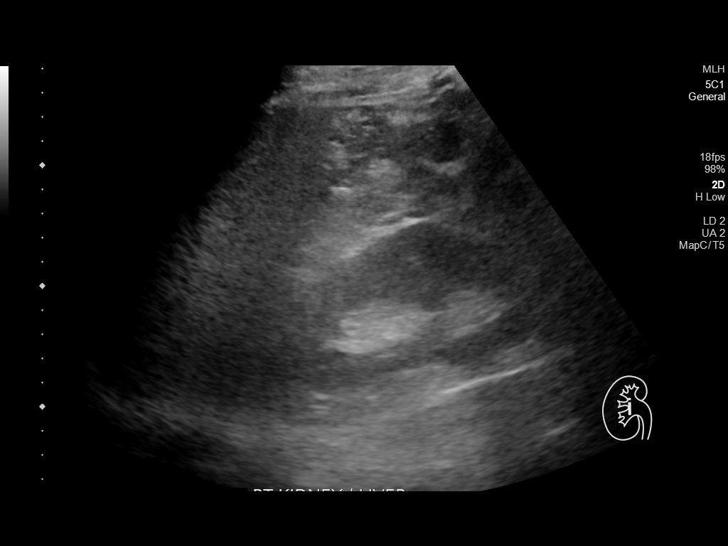
[im 5/50]
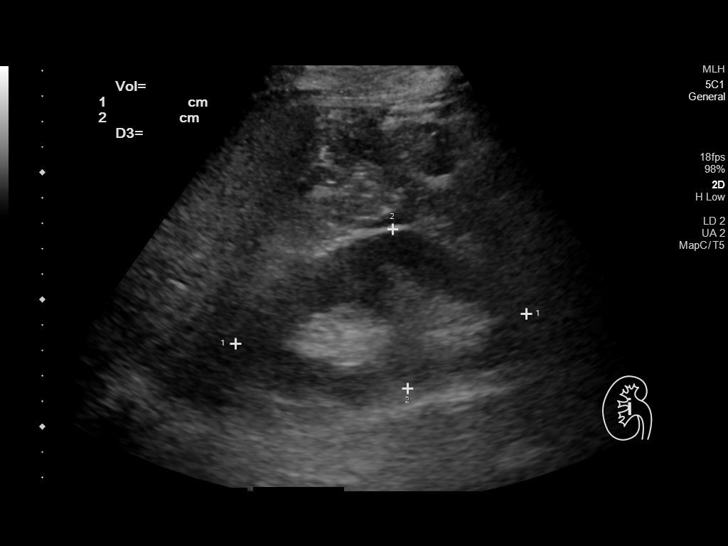
[im 9/50]
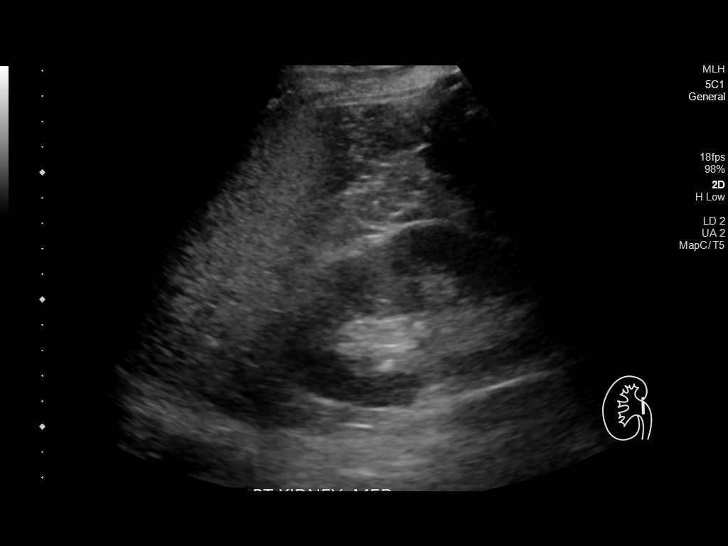
[im 13/50]
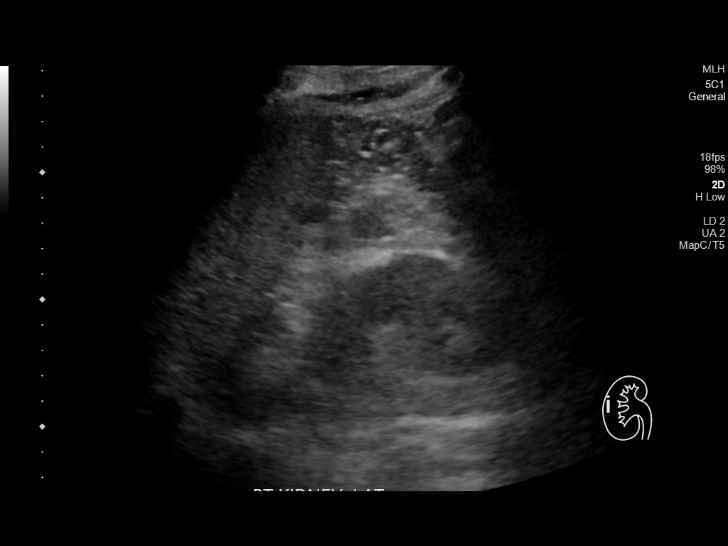
[im 17/50]
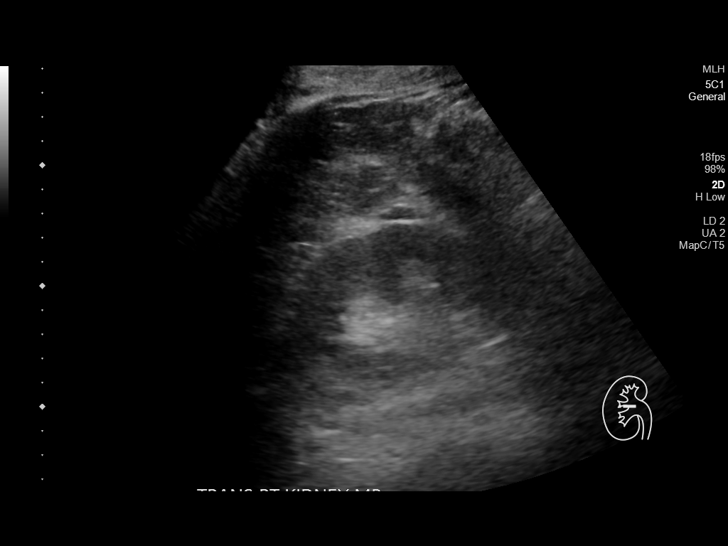
[im 19/50]
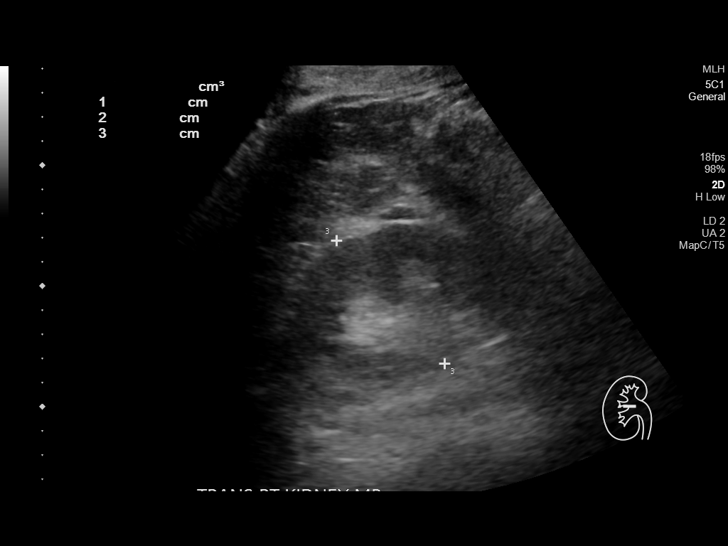
[im 23/50]
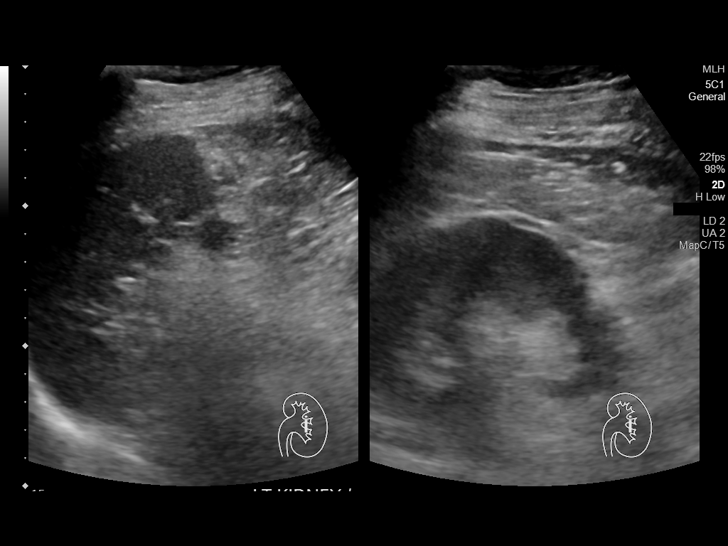
[im 27/50]
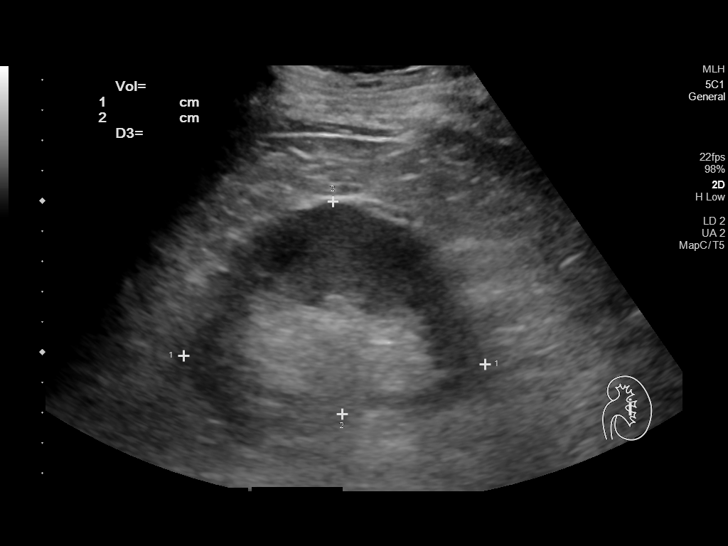
[im 31/50]
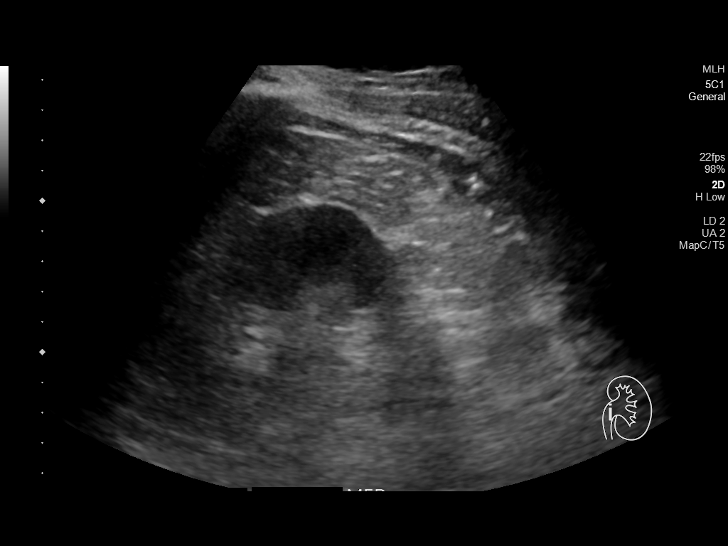
[im 33/50]
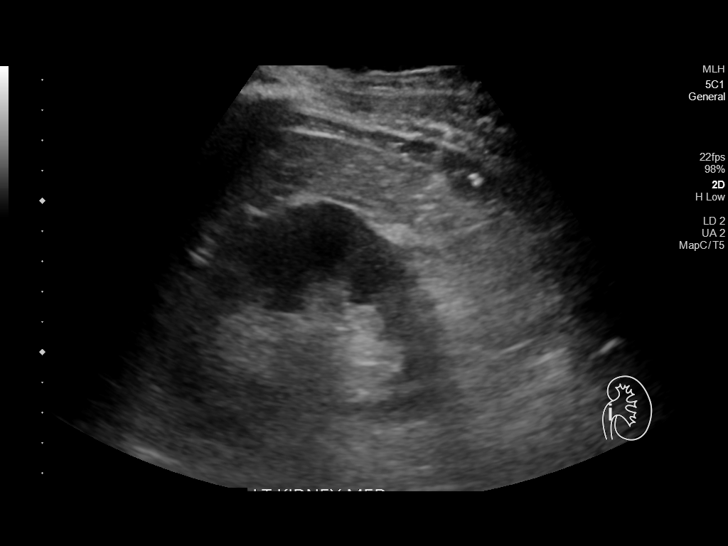
[im 37/50]
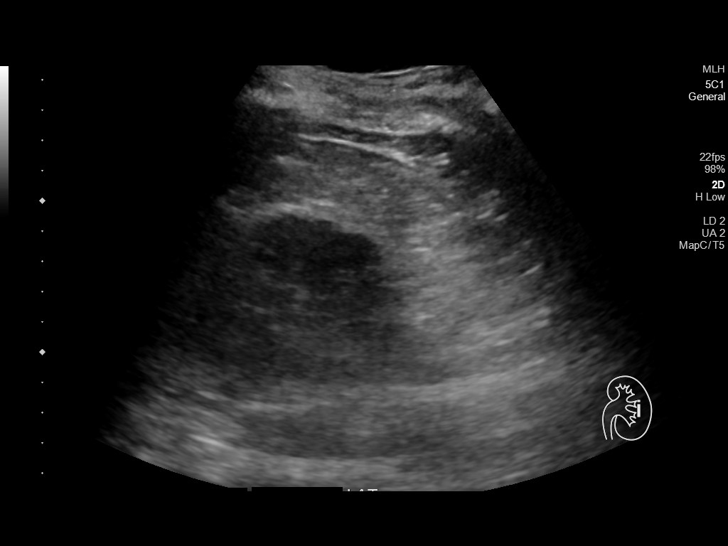
[im 41/50]
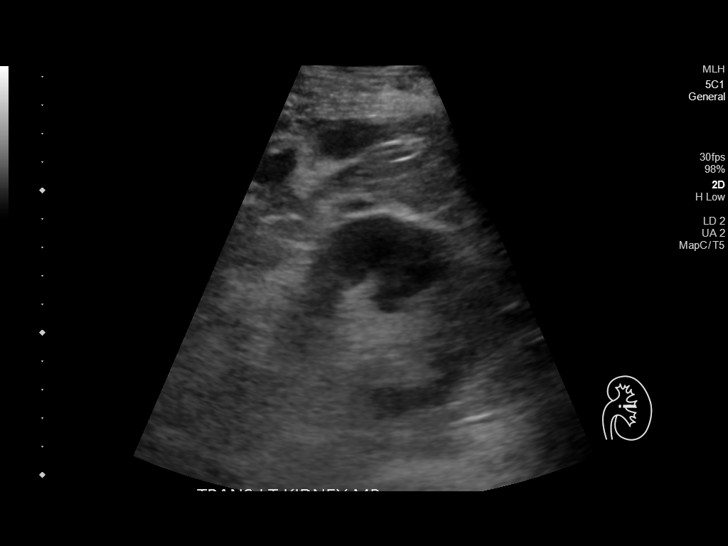
[im 45/50]
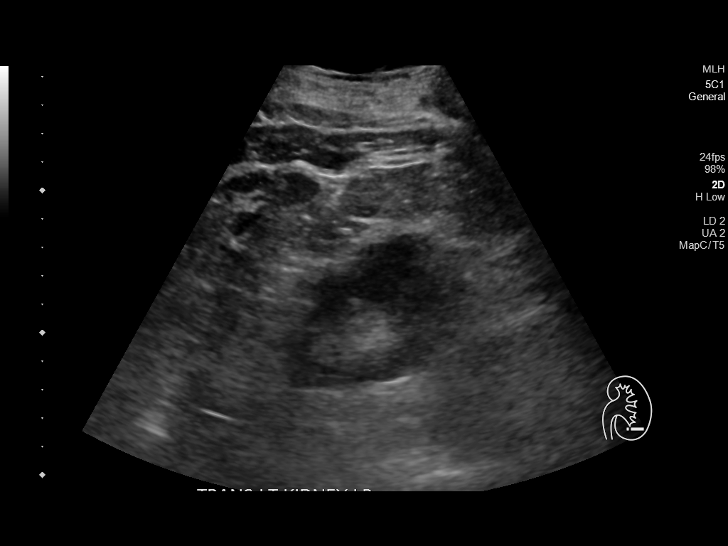
[im 50/50]
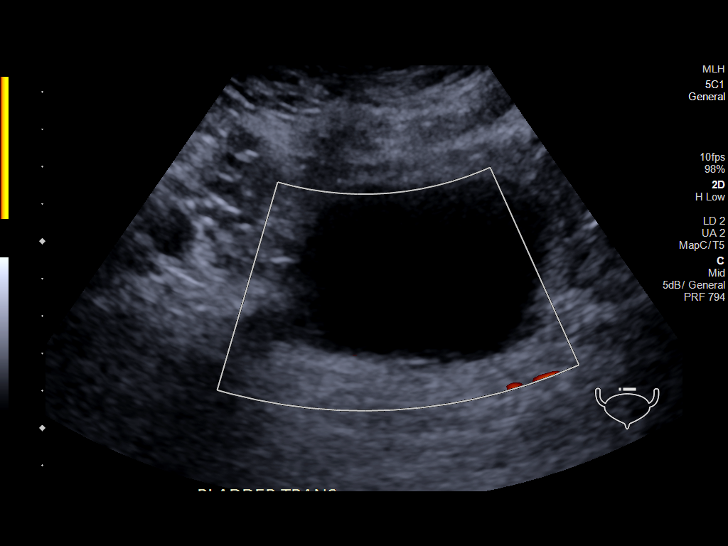

[14 of 25 positions shown; findings below may reference images not displayed]

FINDINGS: Right Kidney:

Renal measurements: 11.5 x 6.3 x 6.8 cm = volume: 257 mL.
Echogenicity within normal limits. No mass or hydronephrosis
visualized.

Left Kidney:

Renal measurements: 10.0 x 7.0 x 6.1 cm = volume: 223 mL.
Echogenicity within normal limits. No mass or hydronephrosis
visualized.

Bladder:

Appears normal for degree of bladder distention.

Other:

None.
IMPRESSION: Negative renal ultrasound.  No hydronephrosis.

## 2019-10-28 IMAGING — MR MR LUMBAR SPINE W/O CM
4 of 5 series · 27 of 48 positions shown · non-contrast
Comparison: None.

CLINICAL DATA: Left lower extremity weakness

EXAM:
MRI LUMBAR SPINE WITHOUT CONTRAST
TECHNIQUE: Multiplanar, multisequence MR imaging of the lumbar spine was
performed. No intravenous contrast was administered.

[Series 9: T2 · sagittal · 4.0mm · 0.73mm/px · 6 of 17 slices shown (1 of 2)]
[im 1/17]
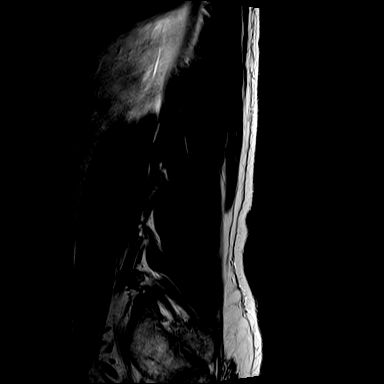
[im 4/17]
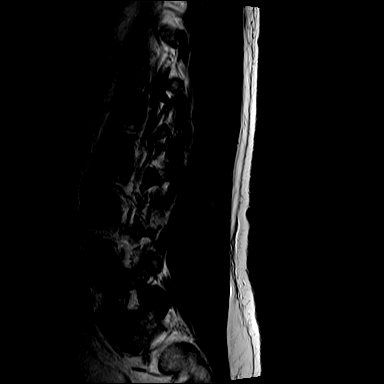
[im 7/17]
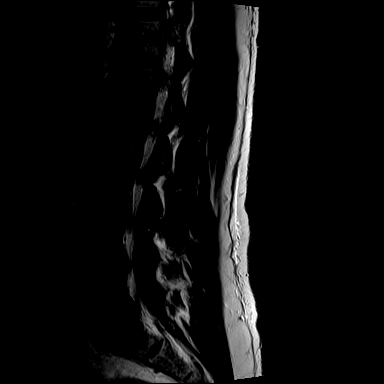
[im 10/17]
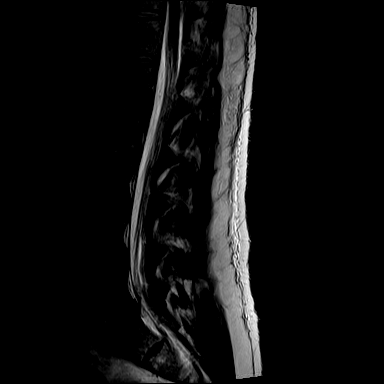
[im 13/17]
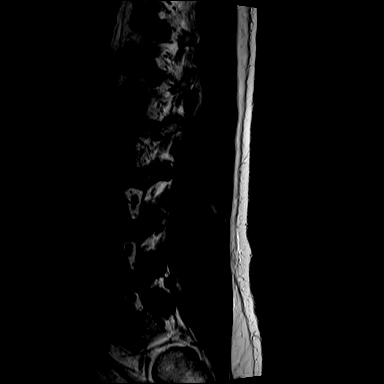
[im 17/17]
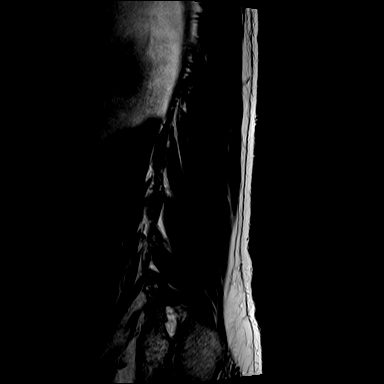

[Series 11: T1 · sagittal · 4.0mm · 0.88mm/px · 6 of 17 slices shown (1 of 2)]
[im 1/17]
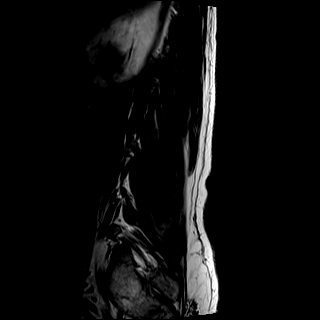
[im 4/17]
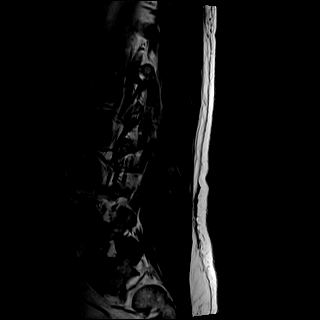
[im 7/17]
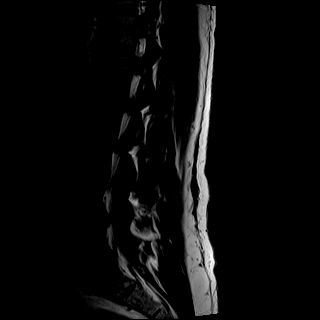
[im 10/17]
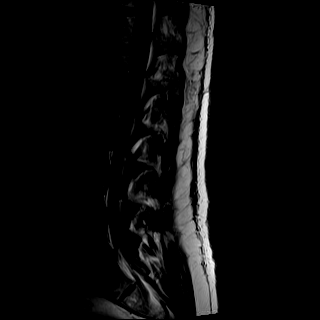
[im 13/17]
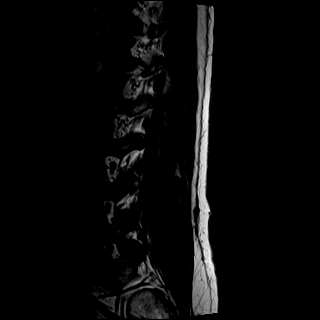
[im 17/17]
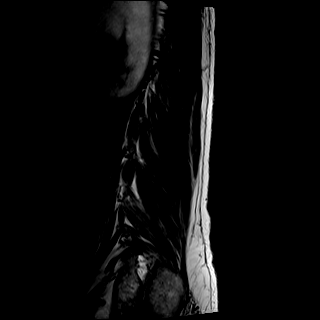

[Series 12: T2 · axial · 4.0mm · 0.57mm/px · z∈[-715,-479]mm · 9 of 41 slices shown (2 of 2)]
[im 1/41]
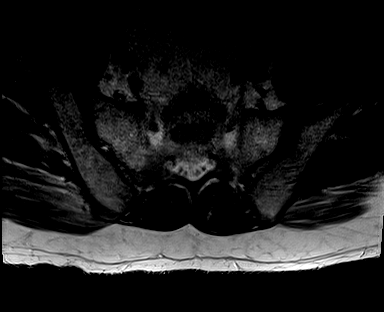
[im 6/41]
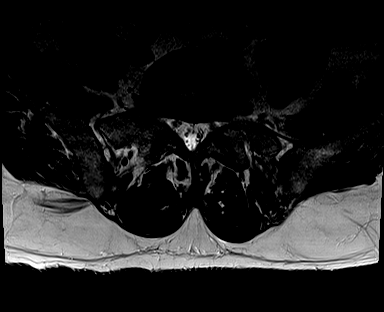
[im 12/41]
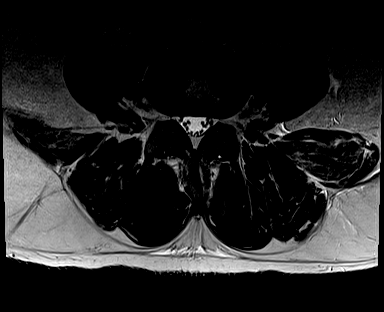
[im 18/41]
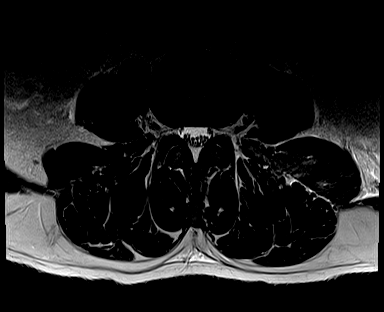
[im 21/41]
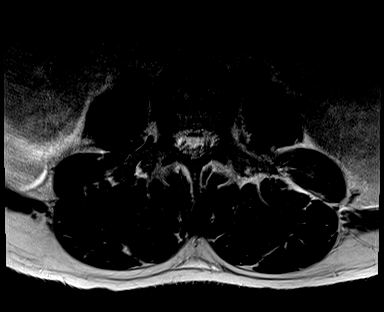
[im 23/41]
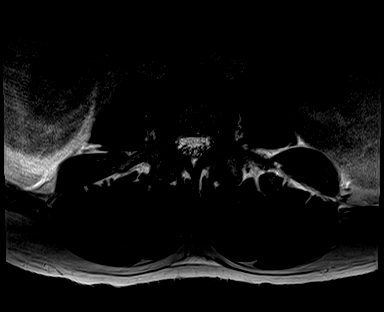
[im 29/41]
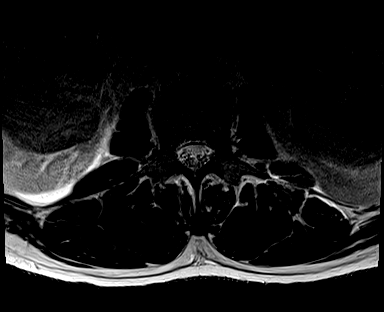
[im 35/41]
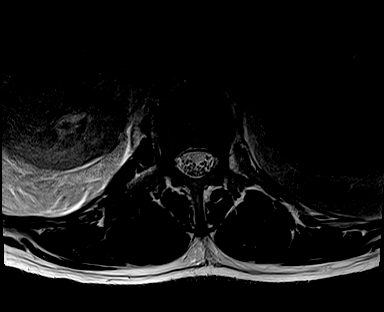
[im 41/41]
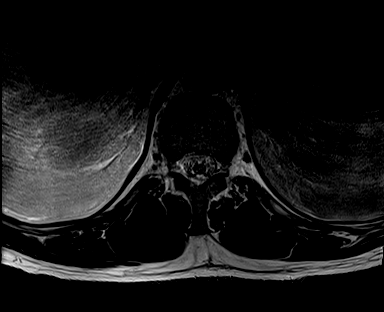

[Series 13: T1 · axial · 4.0mm · 0.34mm/px · z∈[-718,-508]mm · 6 of 41 slices shown (2 of 2)]
[im 1/41]
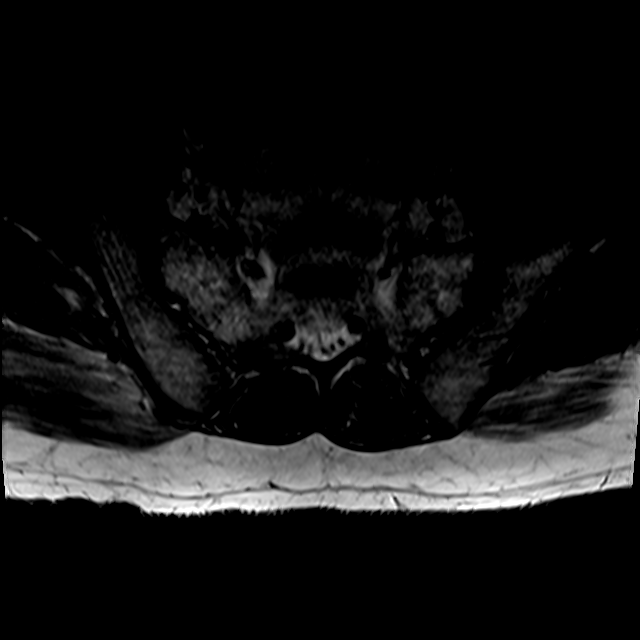
[im 6/41]
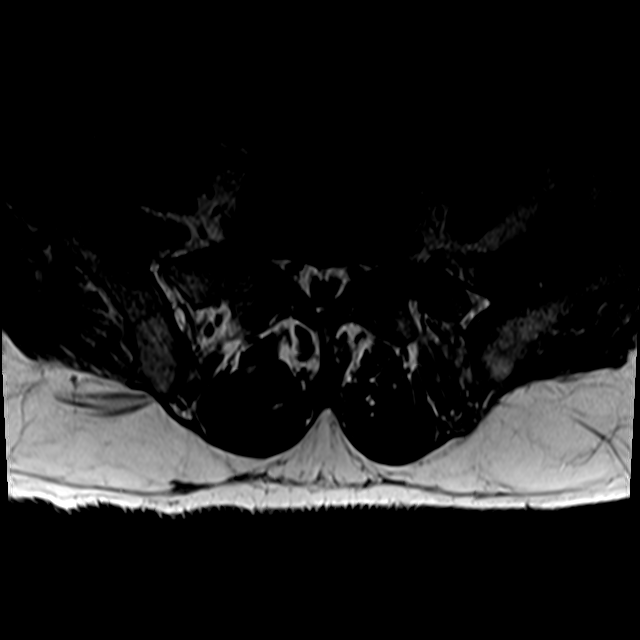
[im 12/41]
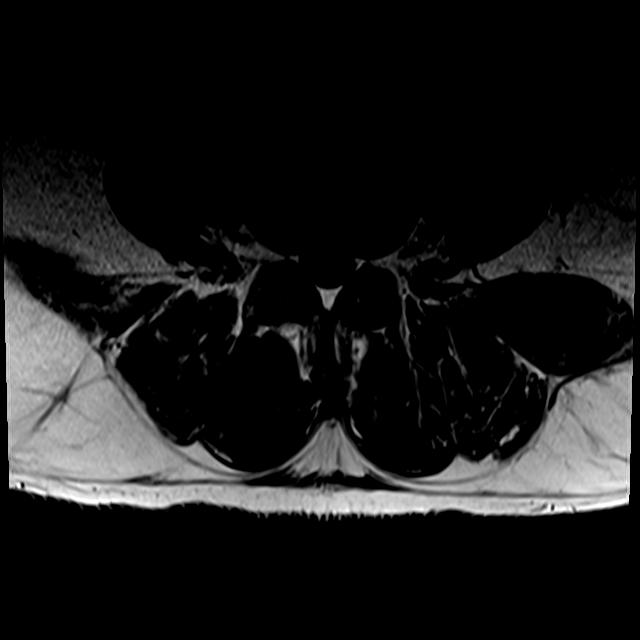
[im 18/41]
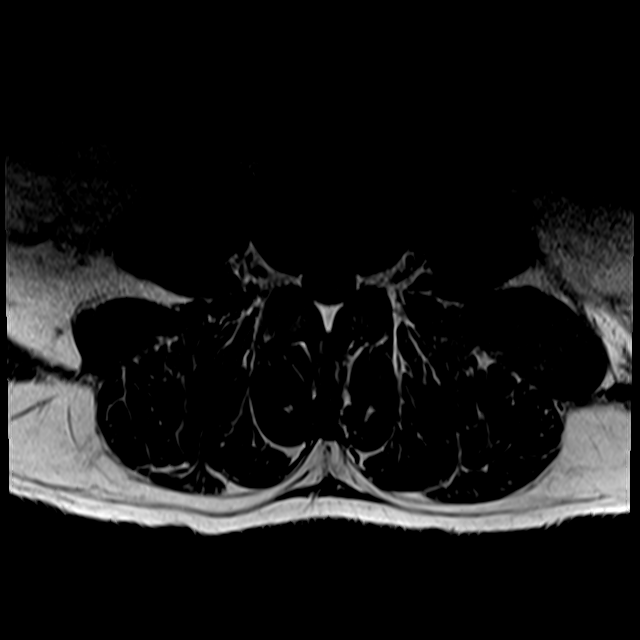
[im 21/41]
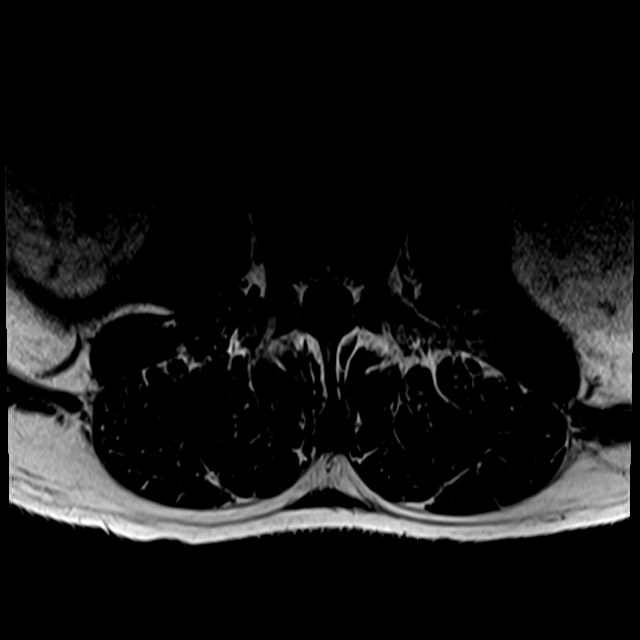
[im 35/41]
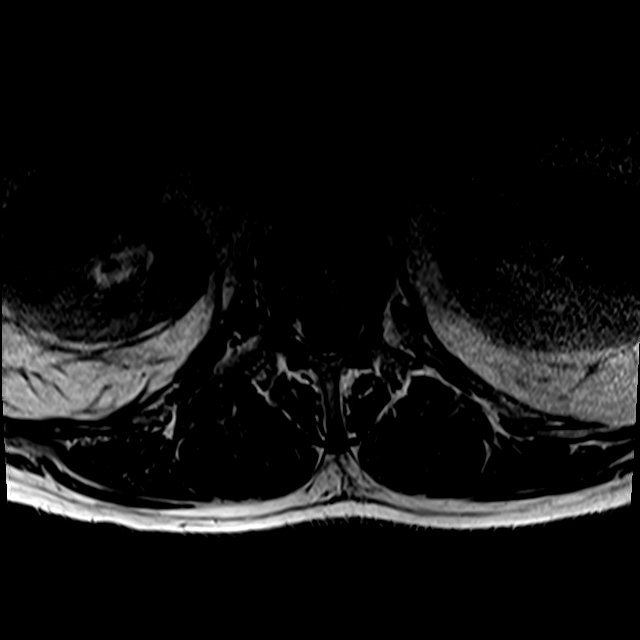

[27 of 48 positions shown; findings below may reference images not displayed]

FINDINGS: Segmentation:  Standard.

Alignment:  Physiologic.

Vertebrae:  No fracture, evidence of discitis, or bone lesion.

Conus medullaris and cauda equina: Conus extends to the L1 level.
Conus and cauda equina appear normal.

Paraspinal and other soft tissues: Negative.

Disc levels:

There is no disc herniation, spinal canal stenosis or neural
foraminal stenosis. There is mild facet arthrosis at L4-5 and L5-S1.
IMPRESSION: 1. No disc herniation, spinal canal stenosis or neural foraminal
stenosis.
2. Mild facet arthrosis at L4-5 and L5-S1.

## 2019-10-28 IMAGING — MR MR MRA HEAD W/O CM
1 series · 19 of 48 positions shown · non-contrast
Comparison: None.

CLINICAL DATA: Left-sided weakness

EXAM:
MRA HEAD WITHOUT CONTRAST
MRA NECK WITHOUT CONTRAST
TECHNIQUE: Angiographic images of the Circle of Willis were obtained using MRA
technique without intravenous contrast. Angiographic images of the
neck were obtained using MRA technique without intravenous contrast.
Carotid stenosis measurements (when applicable) are obtained
utilizing NASCET criteria, using the distal internal carotid
diameter as the denominator.

[Series 6: 3d cow · axial · 0.5mm · 0.41mm/px · z∈[-59,+37]mm · 19 of 204 slices shown]
[im 1/204]
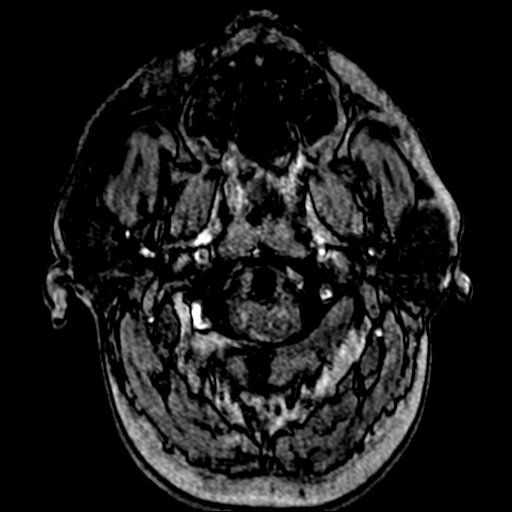
[im 5/204]
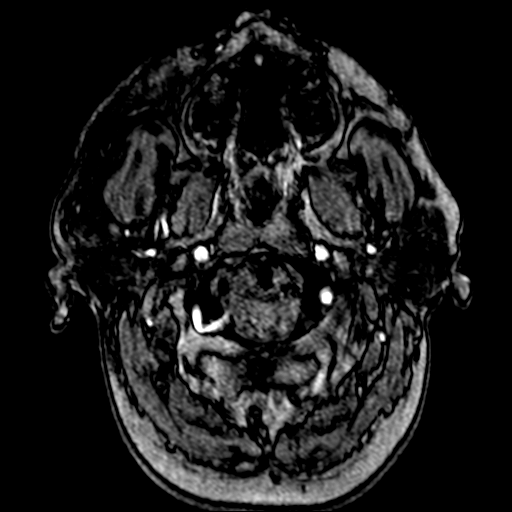
[im 9/204]
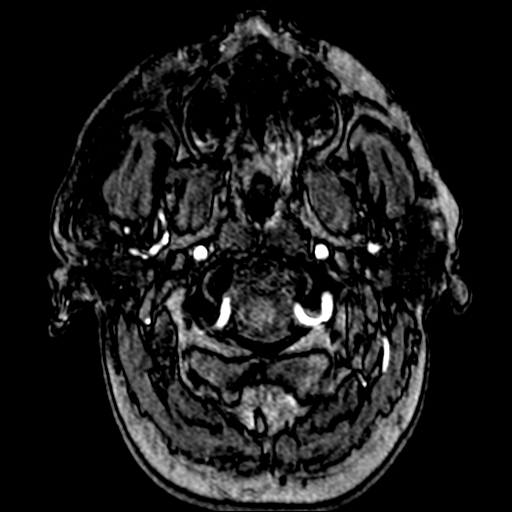
[im 13/204]
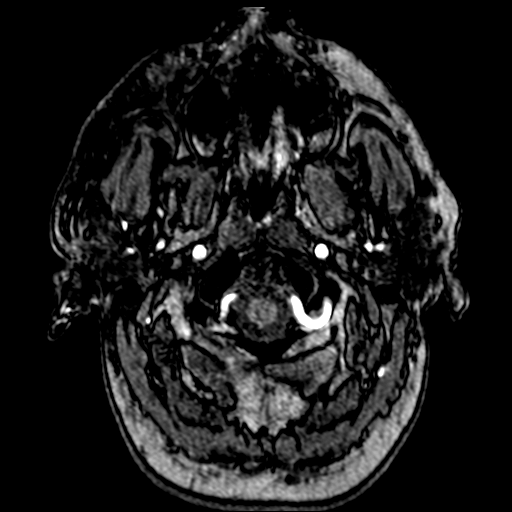
[im 18/204]
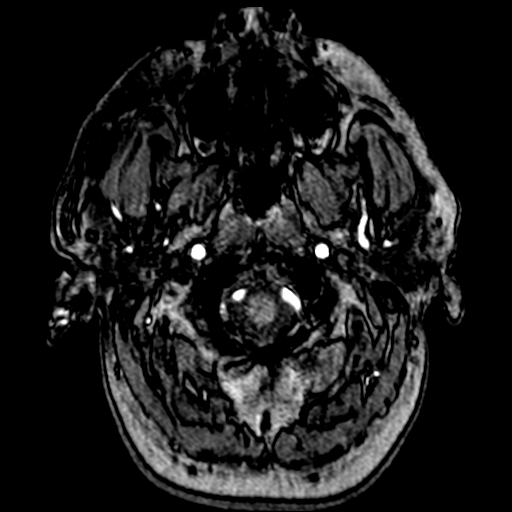
[im 22/204]
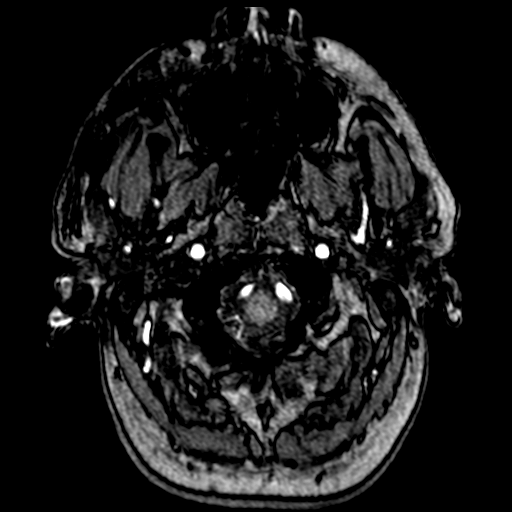
[im 26/204]
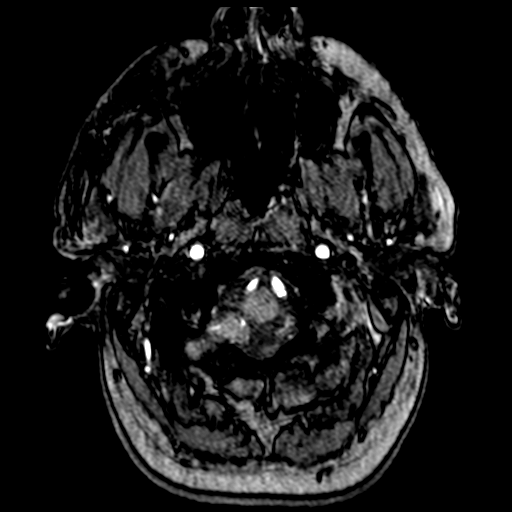
[im 31/204]
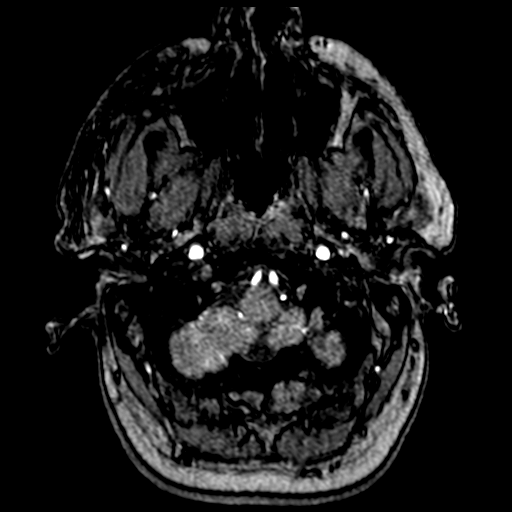
[im 35/204]
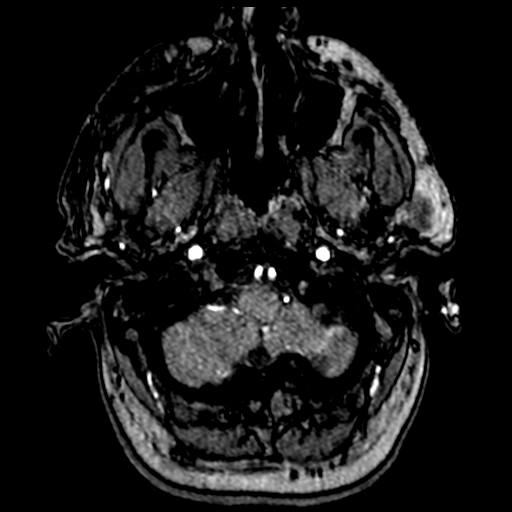
[im 39/204]
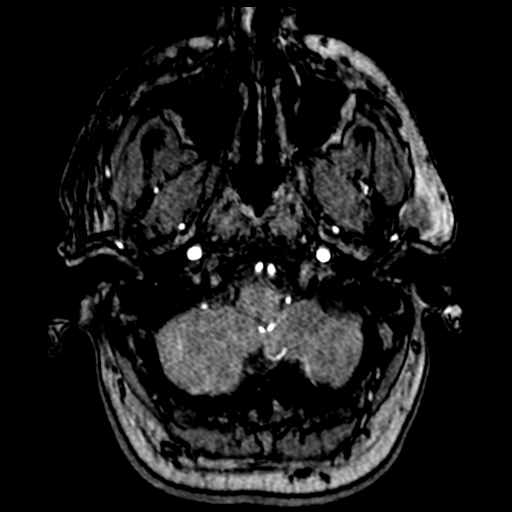
[im 44/204]
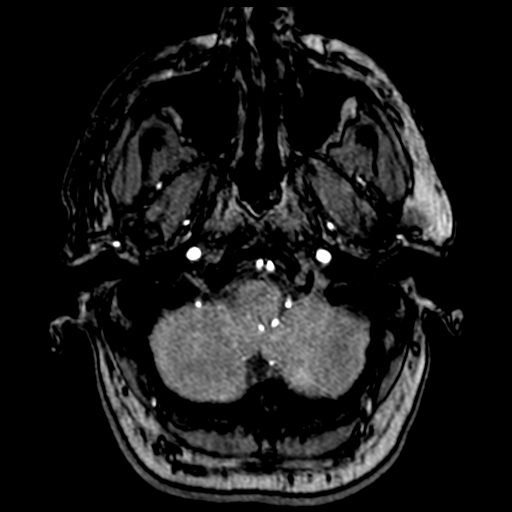
[im 65/204]
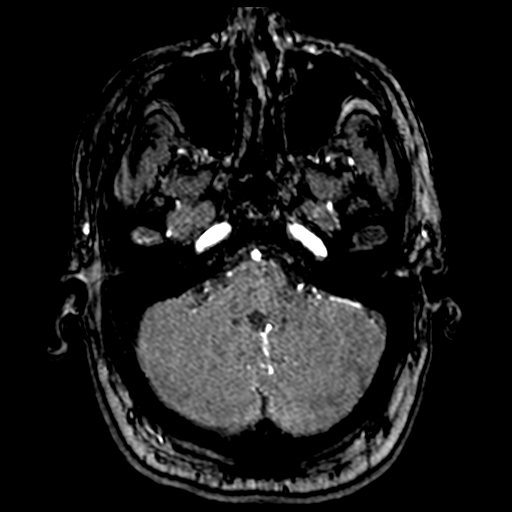
[im 91/204]
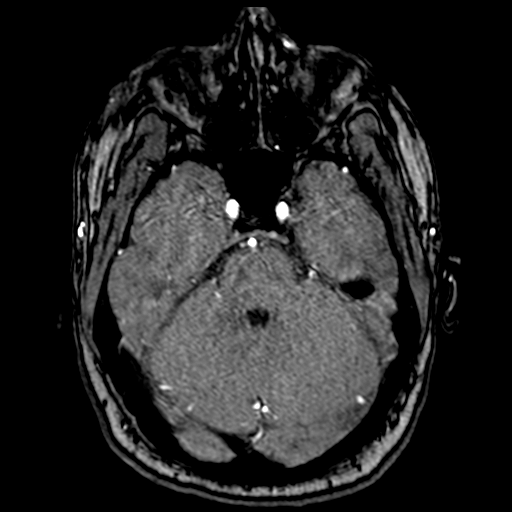
[im 104/204]
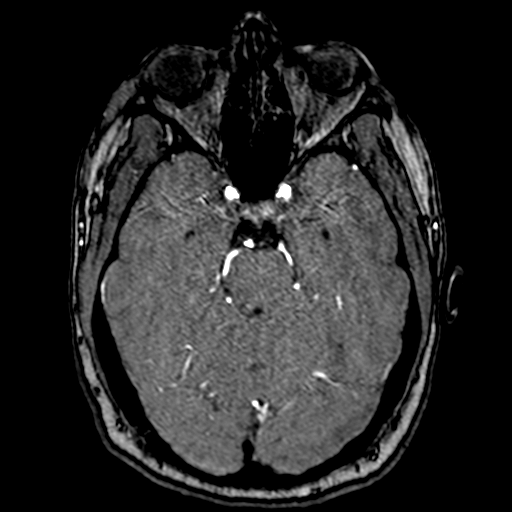
[im 117/204]
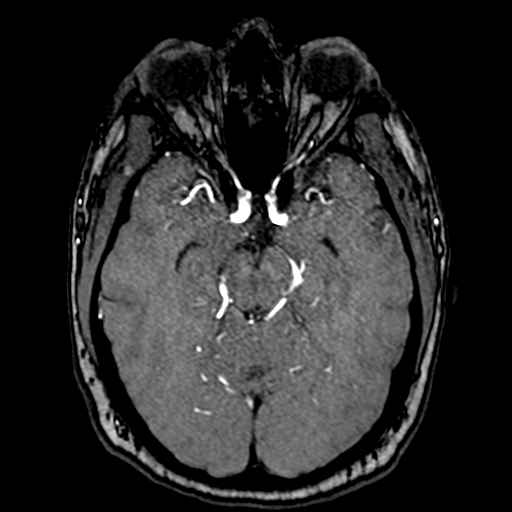
[im 143/204]
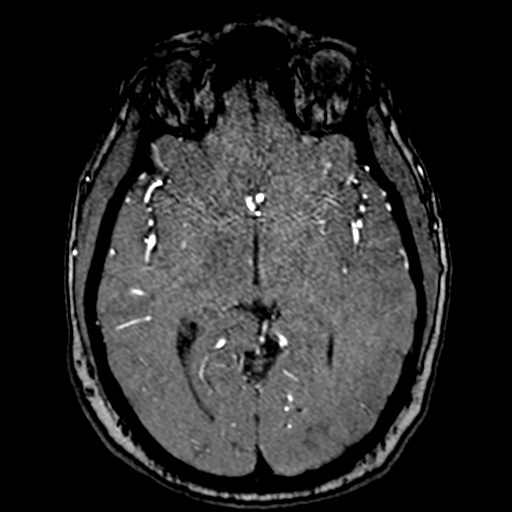
[im 169/204]
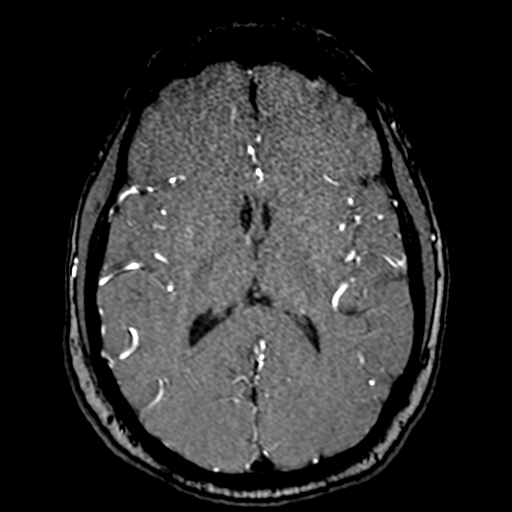
[im 173/204]
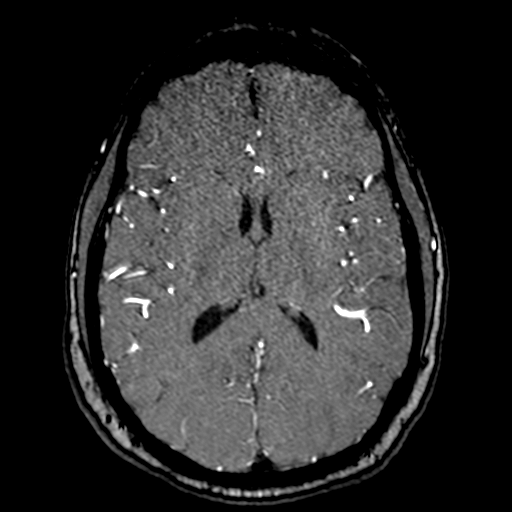
[im 195/204]
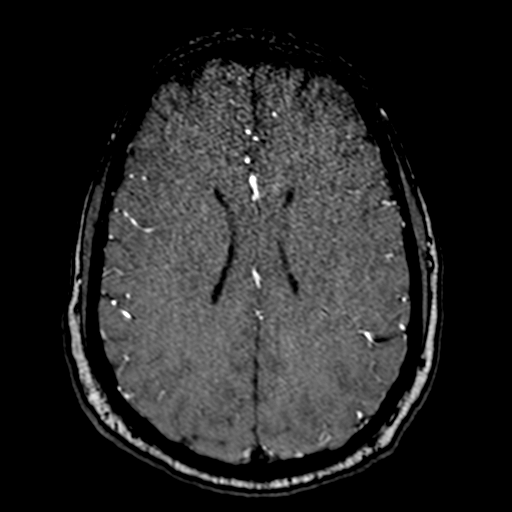

[19 of 48 positions shown; findings below may reference images not displayed]

FINDINGS: MRA HEAD FINDINGS

POSTERIOR CIRCULATION:

--Vertebral arteries: Normal V4 segments.

--Inferior cerebellar arteries: Normal.

--Basilar artery: Normal.

--Superior cerebellar arteries: Normal.

--Posterior cerebral arteries: Normal. The left PCA is predominantly
supplied by the posterior communicating artery.

ANTERIOR CIRCULATION:

--Intracranial internal carotid arteries: Normal.

--Anterior cerebral arteries (ACA): Normal. Both A1 segments are
present. Patent anterior communicating artery (a-comm).

--Middle cerebral arteries (MCA): Normal.

MRA NECK FINDINGS

Normal vertebral and carotid arteries.
IMPRESSION: Normal MRA of the head and neck.

## 2019-10-28 IMAGING — MR MR MRA NECK W/O CM
4 of 5 series · 46 of 48 positions shown · non-contrast
Comparison: None.

CLINICAL DATA: Left-sided weakness

EXAM:
MRA HEAD WITHOUT CONTRAST
MRA NECK WITHOUT CONTRAST
TECHNIQUE: Angiographic images of the Circle of Willis were obtained using MRA
technique without intravenous contrast. Angiographic images of the
neck were obtained using MRA technique without intravenous contrast.
Carotid stenosis measurements (when applicable) are obtained
utilizing NASCET criteria, using the distal internal carotid
diameter as the denominator.

[Series 10: tof_fl3d_tra_iso · axial · 0.6mm · 0.52mm/px · z∈[-207,-125]mm · 20 of 146 slices shown]
[im 1/146]
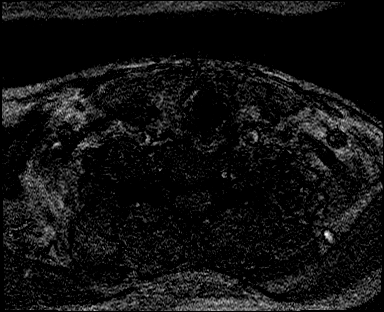
[im 8/146]
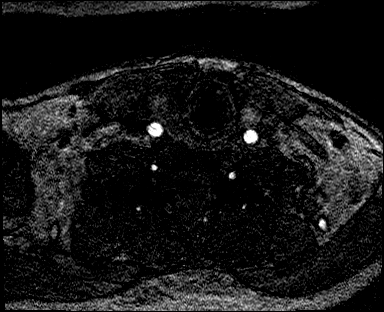
[im 16/146]
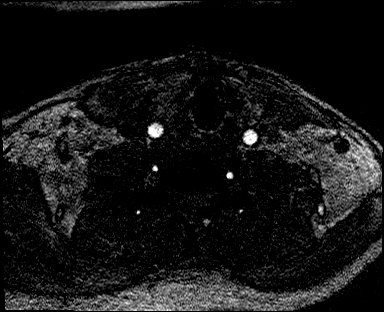
[im 23/146]
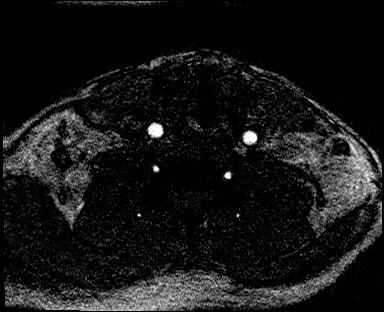
[im 31/146]
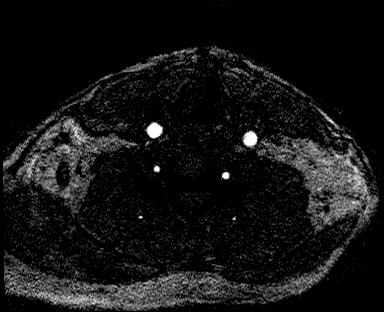
[im 39/146]
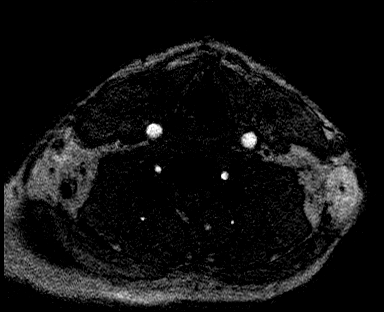
[im 46/146]
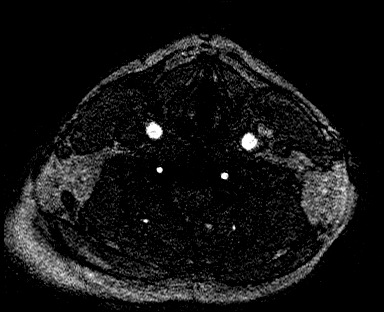
[im 54/146]
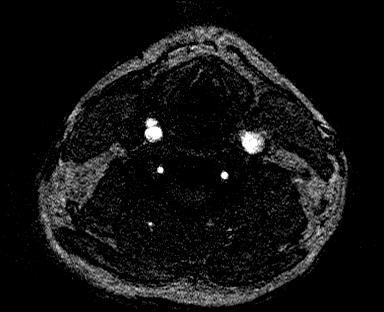
[im 62/146]
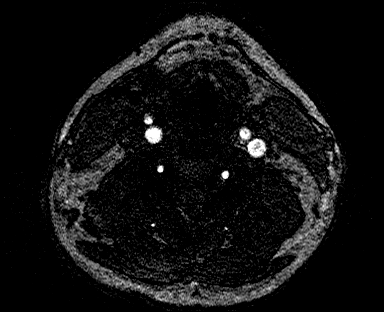
[im 69/146]
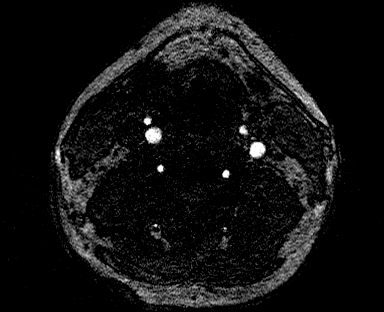
[im 77/146]
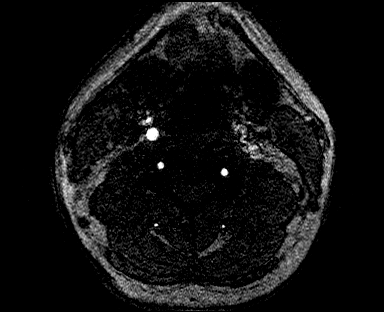
[im 84/146]
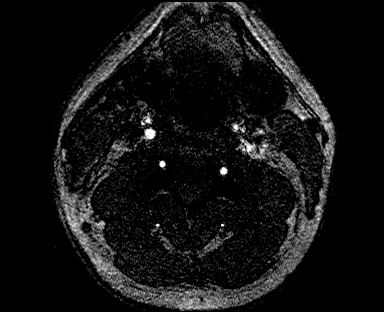
[im 92/146]
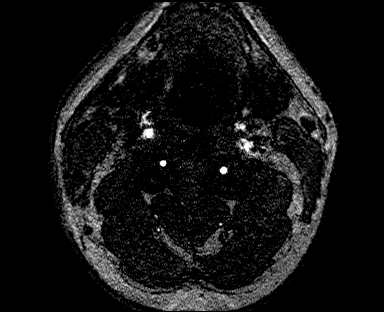
[im 100/146]
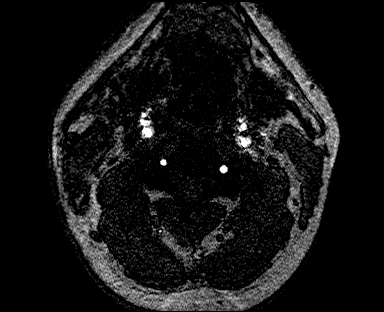
[im 107/146]
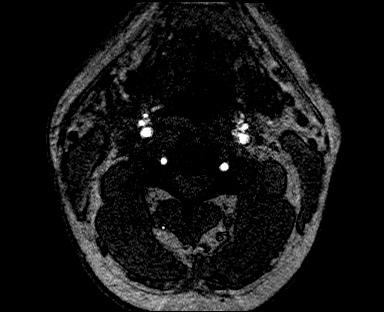
[im 115/146]
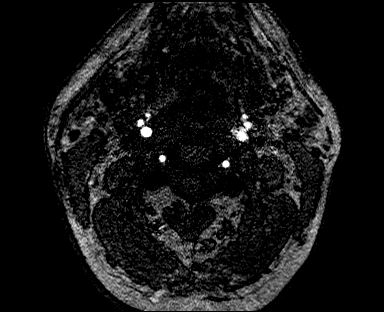
[im 123/146]
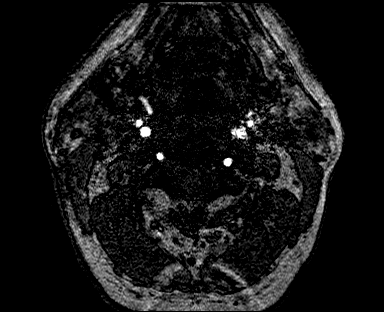
[im 130/146]
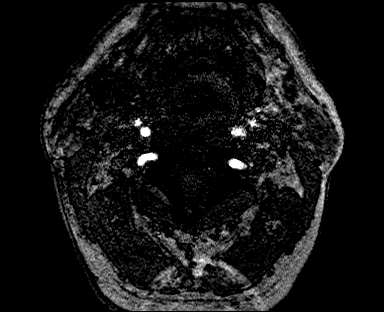
[im 138/146]
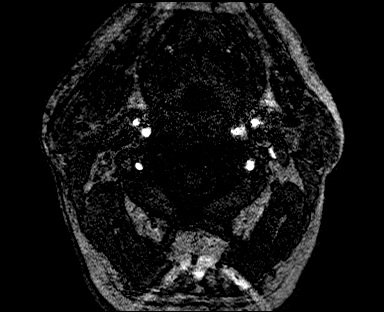
[im 146/146]
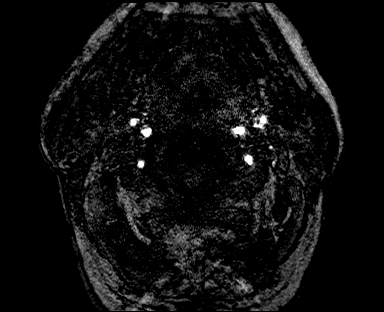

[Series 13: tof_fl2d_tra · axial · 3.0mm · 0.78mm/px · z∈[-298,-67]mm · 16 of 122 slices shown]
[im 1/122]
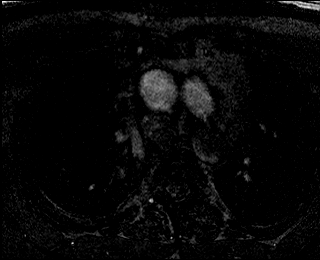
[im 8/122]
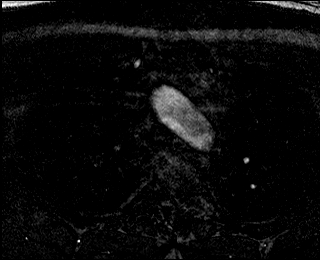
[im 16/122]
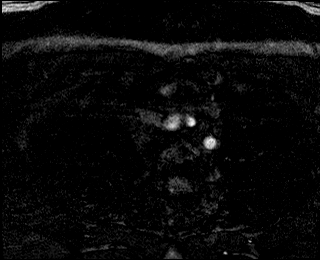
[im 23/122]
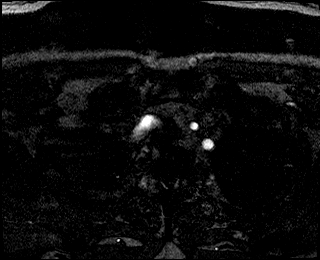
[im 31/122]
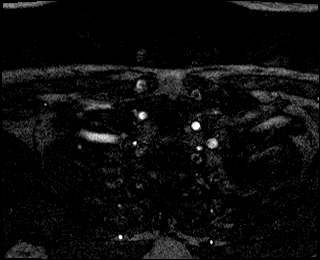
[im 38/122]
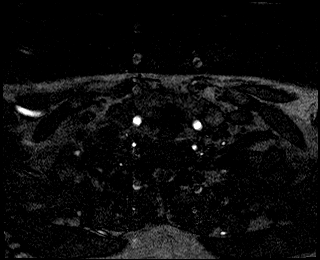
[im 46/122]
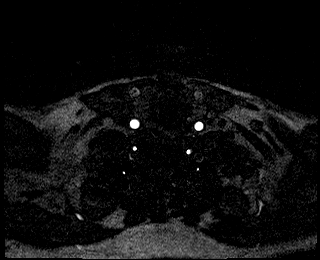
[im 53/122]
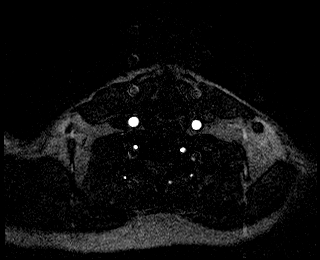
[im 61/122]
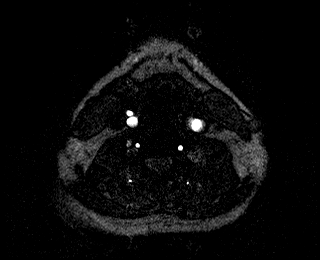
[im 69/122]
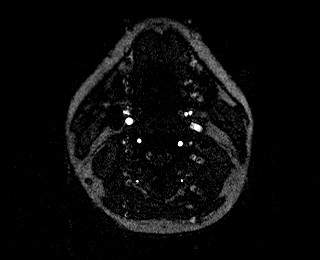
[im 76/122]
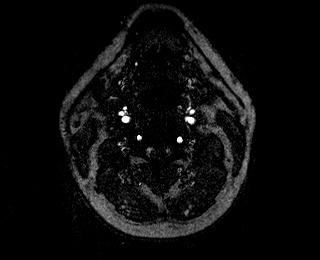
[im 84/122]
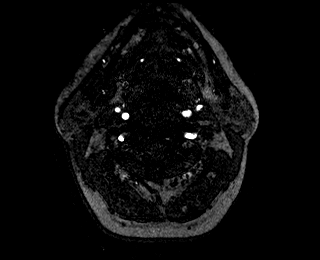
[im 91/122]
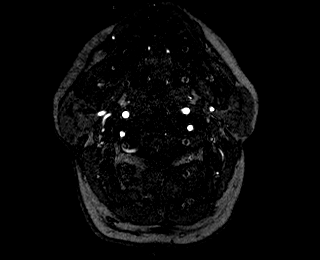
[im 99/122]
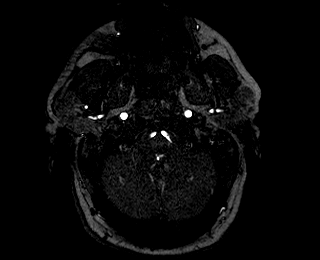
[im 106/122]
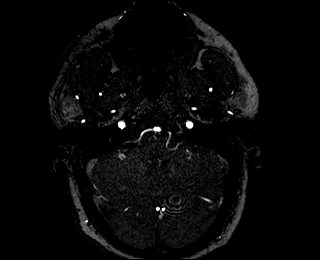
[im 114/122]
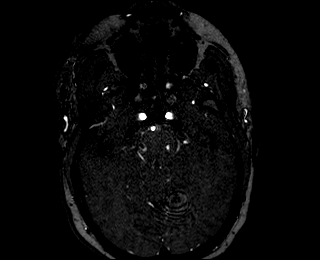

[Series 19: t1_tse_dixon_tra pre_w · axial · 3.0mm · 0.75mm/px · z∈[-237,-121]mm · 4 of 32 slices shown]
[im 1/32]
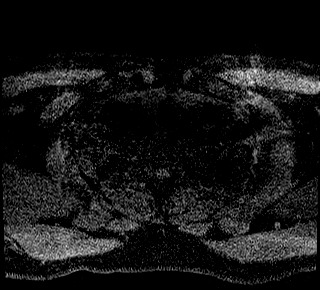
[im 11/32]
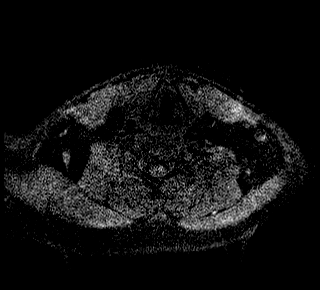
[im 21/32]
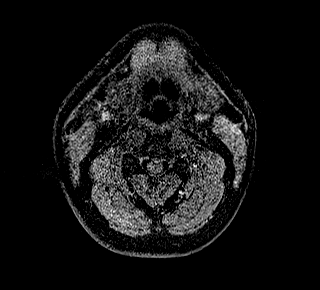
[im 32/32]
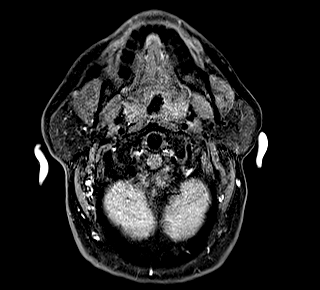

[Series 20: tof_fl2d_tra lower · axial · 3.0mm · 0.78mm/px · z∈[-303,-223]mm · 6 of 40 slices shown]
[im 1/40]
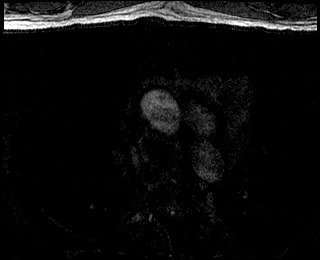
[im 8/40]
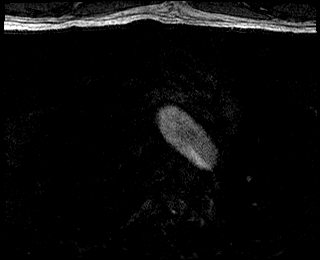
[im 16/40]
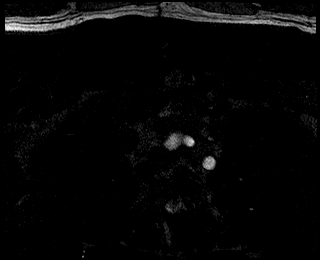
[im 24/40]
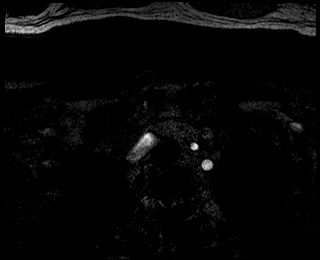
[im 32/40]
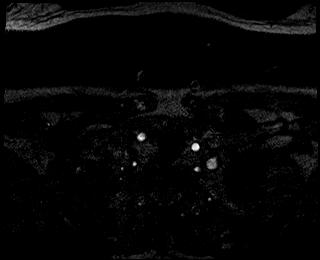
[im 40/40]
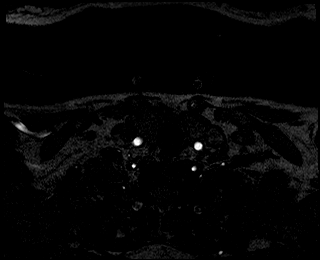

[46 of 48 positions shown; findings below may reference images not displayed]

FINDINGS: MRA HEAD FINDINGS

POSTERIOR CIRCULATION:

--Vertebral arteries: Normal V4 segments.

--Inferior cerebellar arteries: Normal.

--Basilar artery: Normal.

--Superior cerebellar arteries: Normal.

--Posterior cerebral arteries: Normal. The left PCA is predominantly
supplied by the posterior communicating artery.

ANTERIOR CIRCULATION:

--Intracranial internal carotid arteries: Normal.

--Anterior cerebral arteries (ACA): Normal. Both A1 segments are
present. Patent anterior communicating artery (a-comm).

--Middle cerebral arteries (MCA): Normal.

MRA NECK FINDINGS

Normal vertebral and carotid arteries.
IMPRESSION: Normal MRA of the head and neck.

## 2019-10-28 IMAGING — MR MR CERVICAL SPINE W/O CM
5 series · 37 of 48 positions shown · non-contrast
Comparison: None.

CLINICAL DATA: Right upper and lower extremity weakness

EXAM:
MRI CERVICAL SPINE WITHOUT CONTRAST
TECHNIQUE: Multiplanar, multisequence MR imaging of the cervical spine was
performed. No intravenous contrast was administered.

[Series 6: T2 · sagittal · 3.0mm · 0.69mm/px · 6 of 17 slices shown (1 of 2)]
[im 1/17]
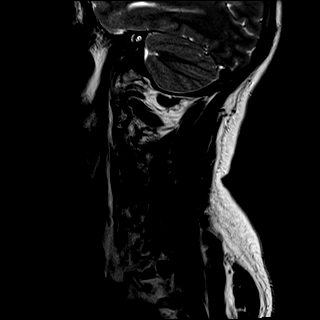
[im 4/17]
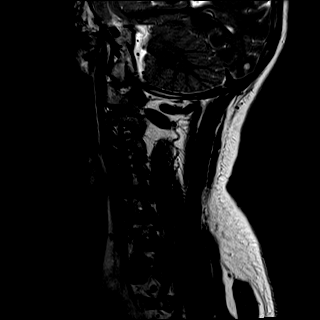
[im 7/17]
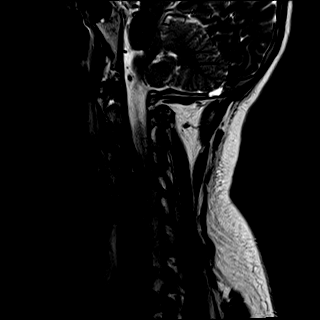
[im 10/17]
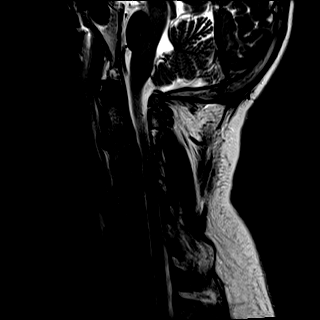
[im 13/17]
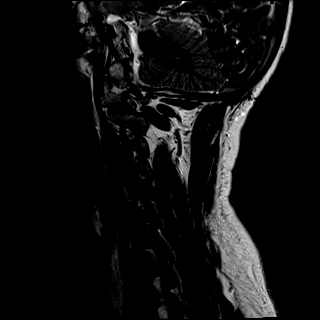
[im 17/17]
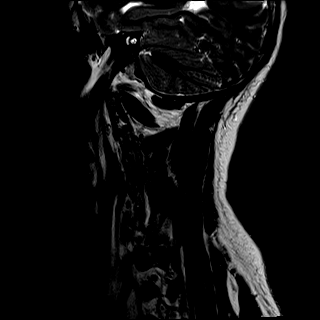

[Series 7: T1 · sagittal · 3.0mm · 0.69mm/px · 6 of 17 slices shown]
[im 1/17]
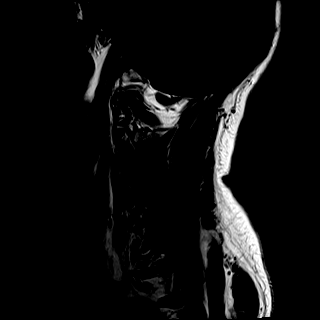
[im 4/17]
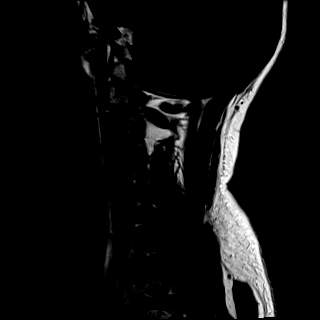
[im 7/17]
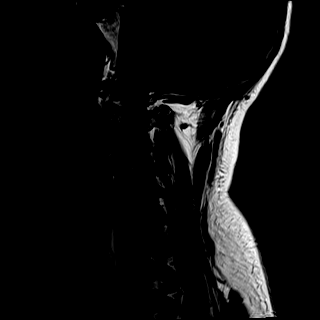
[im 10/17]
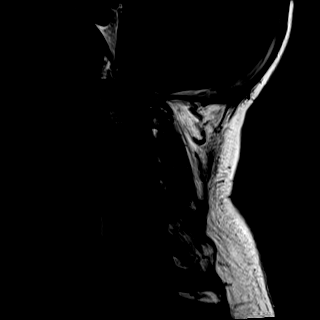
[im 13/17]
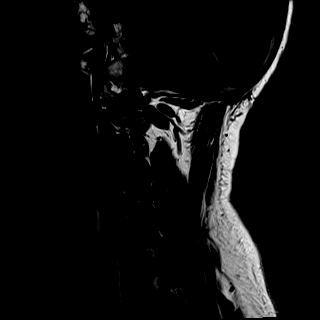
[im 17/17]
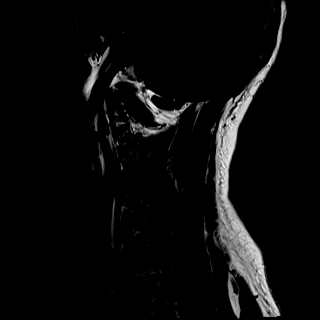

[Series 8: STIR · sagittal · 3.0mm · 0.86mm/px · 6 of 17 slices shown]
[im 1/17]
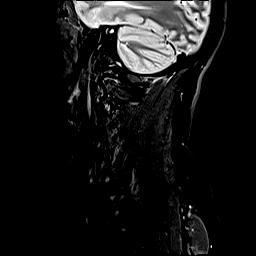
[im 4/17]
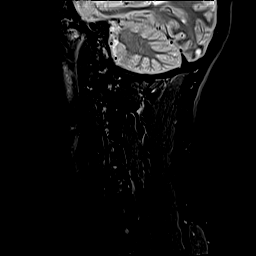
[im 7/17]
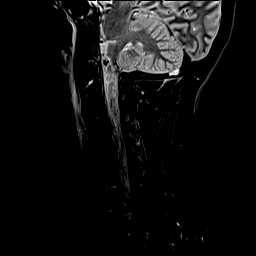
[im 10/17]
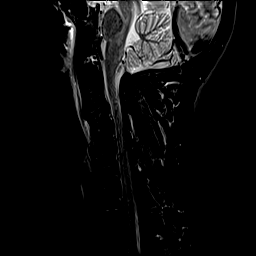
[im 13/17]
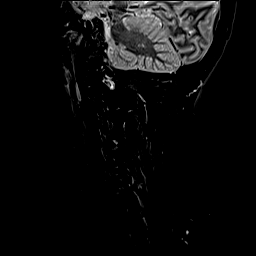
[im 17/17]
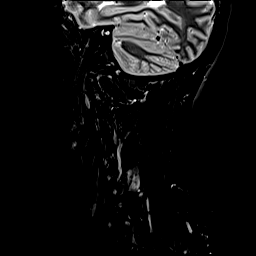

[Series 9: T2 · axial · 3.0mm · 0.66mm/px · z∈[-235,-116]mm · 11 of 40 slices shown (2 of 2)]
[im 1/40]
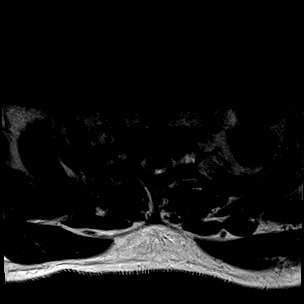
[im 3/40]
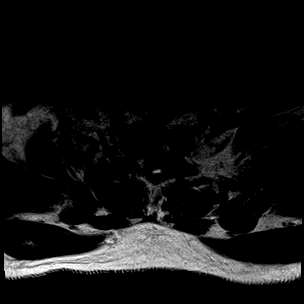
[im 6/40]
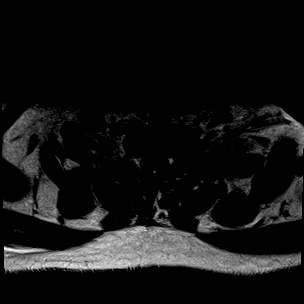
[im 9/40]
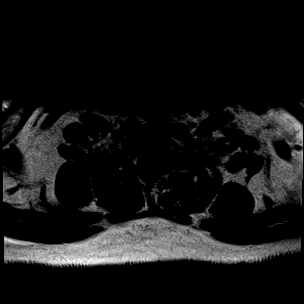
[im 12/40]
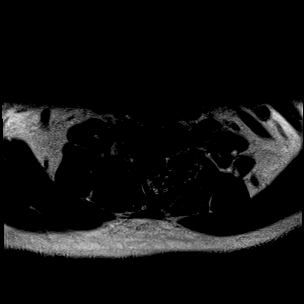
[im 17/40]
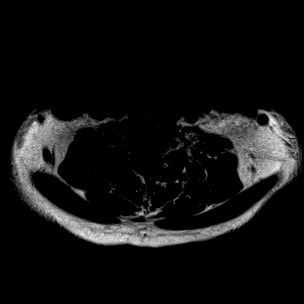
[im 20/40]
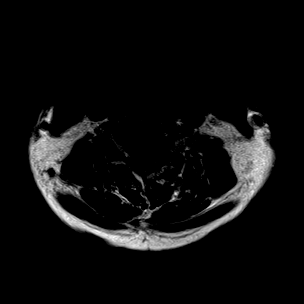
[im 23/40]
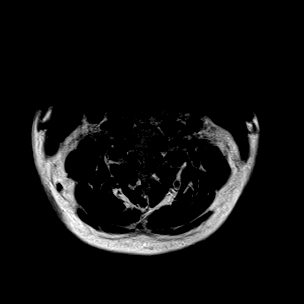
[im 28/40]
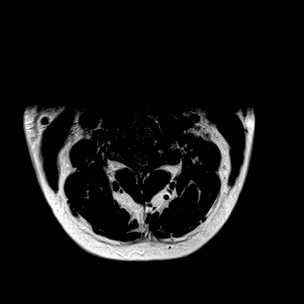
[im 34/40]
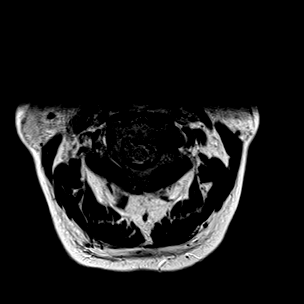
[im 40/40]
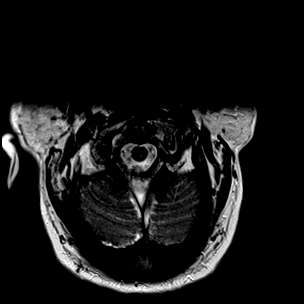

[Series 10: GRE · axial · 3.0mm · 0.39mm/px · z∈[-235,-116]mm · 8 of 40 slices shown]
[im 1/40]
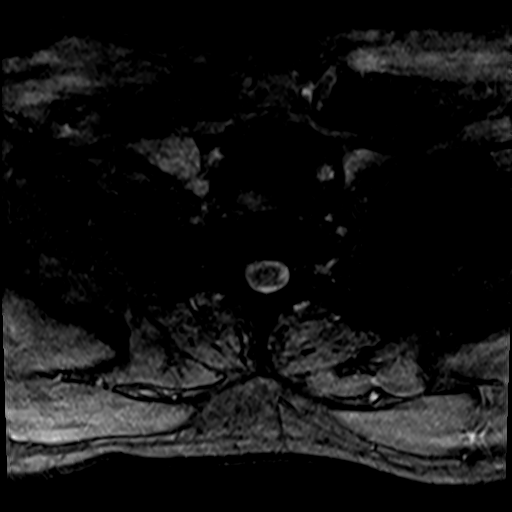
[im 6/40]
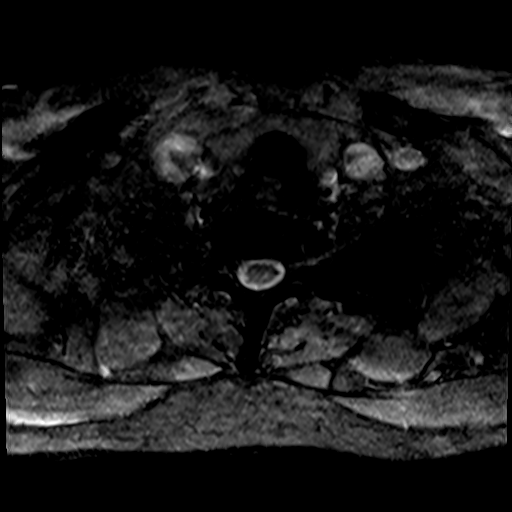
[im 12/40]
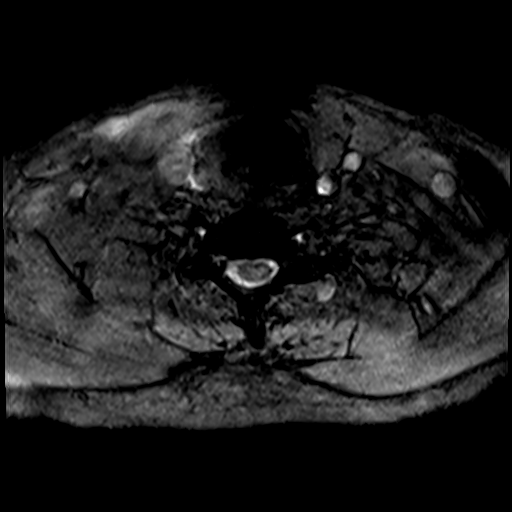
[im 17/40]
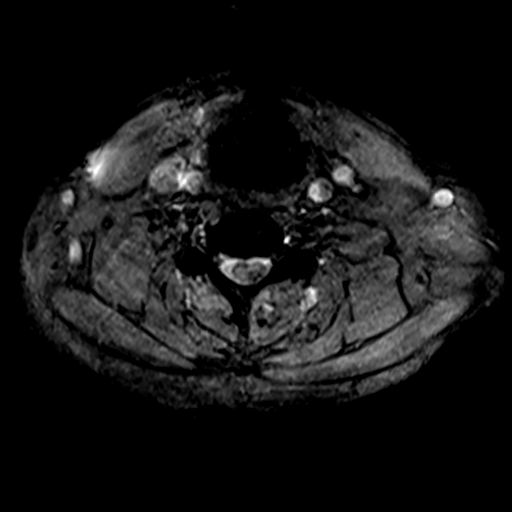
[im 23/40]
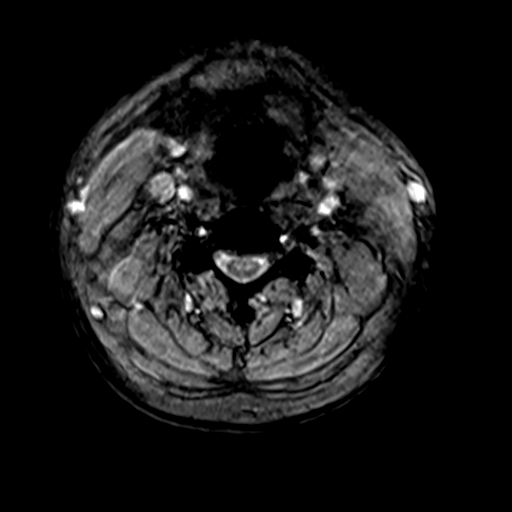
[im 28/40]
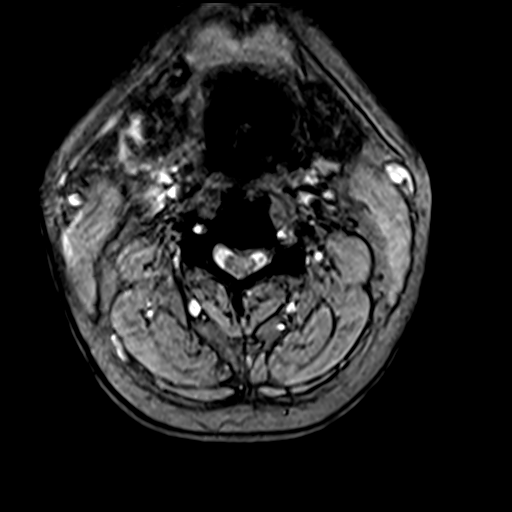
[im 34/40]
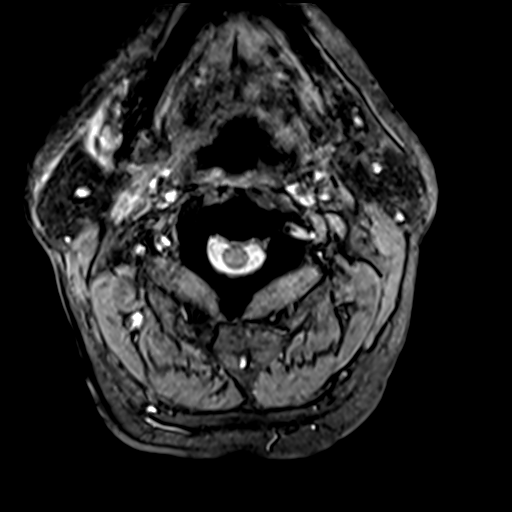
[im 40/40]
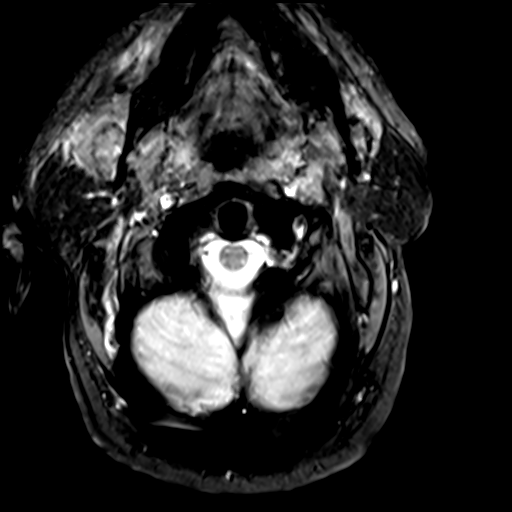

[37 of 48 positions shown; findings below may reference images not displayed]

FINDINGS: Alignment: Reversal of normal cervical lordosis may be positional or
due to muscle spasm.

Vertebrae: No fracture, evidence of discitis, or bone lesion.

Cord: Normal signal and morphology.

Posterior Fossa, vertebral arteries, paraspinal tissues: Visualized
posterior fossa is normal. Vertebral artery flow voids are
preserved. No prevertebral soft tissue swelling.

Disc levels:

C1-2: Small central disc protrusion.

C2-3: Normal disc space and facet joints. There is no spinal canal
stenosis. No neural foraminal stenosis.

C3-4: Small central disc protrusion with minimal superior migration
. Mild spinal canal stenosis. Mild left neural foraminal stenosis.

C4-5: Small disc bulge with bilateral facet hypertrophy and left
greater than right uncovertebral spurring. There is no spinal canal
stenosis. Mild right, moderate left neural foraminal stenosis.

C5-6: Normal disc space and facet joints. There is no spinal canal
stenosis. No neural foraminal stenosis.

C6-7: Normal disc space and facet joints. There is no spinal canal
stenosis. No neural foraminal stenosis.

C7-T1: Normal disc space and facet joints. There is no spinal canal
stenosis. No neural foraminal stenosis.
IMPRESSION: 1. Mild spinal canal stenosis at C3-4 secondary to small central
disc protrusion with minimal superior migration.
2. Mild right and moderate left C4-5 neural foraminal stenosis.
3. Mild left C3-4 neural foraminal stenosis.

## 2019-10-28 MED ORDER — SODIUM BICARBONATE 8.4 % IV SOLN
INTRAVENOUS | Status: AC
  Filled 2019-10-28 (×6): qty 100

## 2019-10-28 MED ORDER — SODIUM ZIRCONIUM CYCLOSILICATE 10 G PO PACK
10.0000 g | PACK | Freq: Two times a day (BID) | ORAL | Status: AC
  Administered 2019-10-28 – 2019-10-29 (×2): 10 g via ORAL
  Filled 2019-10-28 (×3): qty 1

## 2019-10-28 MED ORDER — SODIUM ZIRCONIUM CYCLOSILICATE 10 G PO PACK
10.0000 g | PACK | Freq: Three times a day (TID) | ORAL | Status: DC
  Administered 2019-10-28: 10 g via ORAL
  Filled 2019-10-28: qty 1

## 2019-10-28 MED ORDER — CHLORHEXIDINE GLUCONATE CLOTH 2 % EX PADS
6.0000 | MEDICATED_PAD | Freq: Every day | CUTANEOUS | Status: DC
  Administered 2019-10-29 – 2019-11-09 (×10): 6 via TOPICAL

## 2019-10-28 MED ORDER — CALCIUM GLUCONATE-NACL 2-0.675 GM/100ML-% IV SOLN
2.0000 g | Freq: Once | INTRAVENOUS | Status: AC
  Administered 2019-10-28: 2000 mg via INTRAVENOUS
  Filled 2019-10-28: qty 100

## 2019-10-28 MED ORDER — ASPIRIN EC 81 MG PO TBEC
81.0000 mg | DELAYED_RELEASE_TABLET | Freq: Every day | ORAL | Status: DC
  Administered 2019-10-29 – 2019-11-09 (×12): 81 mg via ORAL
  Filled 2019-10-28 (×12): qty 1

## 2019-10-28 MED ORDER — CALCIUM GLUCONATE-NACL 2-0.675 GM/100ML-% IV SOLN
2.0000 g | Freq: Once | INTRAVENOUS | Status: AC
  Administered 2019-10-29: 2000 mg via INTRAVENOUS
  Filled 2019-10-28 (×2): qty 100

## 2019-10-28 MED ORDER — LACTATED RINGERS IV SOLN: INTRAVENOUS | Status: AC

## 2019-10-28 NOTE — Evaluation (Signed)
Physical Therapy Evaluation Patient Details Name: [name] MRN: [id number] DOB: [birth year] Today's Date: 10/28/2019   History of Present Illness  49 year old male presents with decreased level of consciousness. Patient states that he took [name] just prior to arrival. States that he was not trying to harm himself. Denies any other coingestions. Does complain of a fall,  woke this morning and had pain to the right side of his face. Also has bilateral lower extremity muscle aches, admitted with ARF and rhabdomyolysis.  PMH: PE, tobacco abuse  Clinical Impression  Pt admitted with above diagnosis.  Pt limited by RLE pain and weakness. Requesting to use BSC. Assisted to [facility] using RW to limit wt on RLE d/t pain,  min assist. Will follow in acute, doubt he will need f/u PT, DME TBD  Pt currently with functional limitations due to the deficits listed below (see PT Problem List). Pt will benefit from skilled PT to increase their independence and safety with mobility to allow discharge to the venue listed below.       Follow Up Recommendations No PT follow up    Equipment Recommendations  Other (comment) (TBD)    Recommendations for Other Services       Precautions / Restrictions Precautions Precautions: Fall Restrictions Weight Bearing Restrictions: No      Mobility  Bed Mobility Overal bed mobility: Needs Assistance Bed Mobility: Supine to Sit     Supine to sit: Min assist;Mod assist;+2 for safety/equipment     General bed mobility comments: assist with RLE and to elevate trunk  Transfers Overall transfer level: Needs assistance Equipment used: Rolling walker (2 wheeled) Transfers: Sit to/from [facility] Sit to Stand: Min assist Stand pivot transfers: Min assist       General transfer comment: cues for technique, hand placement, assist to rise and transition to RW  Ambulation/Gait             General Gait Details: unable d/t pain  Stairs             Wheelchair Mobility    Modified Rankin (Stroke Patients Only)       Balance Overall balance assessment: Needs assistance Sitting-balance support: No upper extremity supported;Feet supported Sitting balance-Leahy Scale: Fair       Standing balance-Leahy Scale: Poor Standing balance comment: reliant on UEs                             Pertinent Vitals/Pain Pain Assessment: Faces Faces Pain Scale: Hurts whole lot Pain Location: RLE Pain Descriptors / Indicators: Grimacing;Sore Pain Intervention(s): Limited activity within patient's tolerance;Monitored during session;Premedicated before session    Home Living Family/patient expects to be discharged to:: Private residence Living Arrangements: Spouse/significant other Available Help at Discharge: Family Type of Home: House Home Access: Stairs to enter   Technical brewer of Steps: 2-3 Home Layout: One level Home Equipment: None      Prior Function Level of Independence: Independent               Hand Dominance        Extremity/Trunk Assessment   Upper Extremity Assessment Upper Extremity Assessment: RUE deficits/detail RUE Deficits / Details: c/o pain with end range elbow flexion and Shoulder flexion 3/5    Lower Extremity Assessment Lower Extremity Assessment: RLE deficits/detail RLE Deficits / Details: AAROM right knee to 90 degrees, hip flexion to 90 degrees, unable to df --all ROM limited by pain and  edema. strength grossly 2/5 RLE: Unable to fully assess due to pain       Communication   Communication: No difficulties  Cognition Arousal/Alertness: Awake/alert Behavior During Therapy: WFL for tasks assessed/performed Overall Cognitive Status: Within Functional Limits for tasks assessed                                        General Comments      Exercises     Assessment/Plan    PT Assessment Patient needs continued PT services  PT Problem List  Pain;Decreased balance;Decreased knowledge of use of DME;Decreased activity tolerance;Decreased range of motion;Decreased mobility;Decreased strength       PT Treatment Interventions DME instruction;Gait training;Functional mobility training;Therapeutic activities;Patient/family education;Therapeutic exercise    PT Goals (Current goals can be found in the Care Plan section)  Acute Rehab PT Goals Patient Stated Goal: less pain PT Goal Formulation: With patient Time For Goal Achievement: 11/11/19 Potential to Achieve Goals: Good    Frequency Min 3X/week   Barriers to discharge        Co-evaluation               AM-PAC PT "6 Clicks" Mobility  Outcome Measure Help needed turning from your back to your side while in a flat bed without using bedrails?: A Little Help needed moving from lying on your back to sitting on the side of a flat bed without using bedrails?: A Lot Help needed moving to and from a bed to a chair (including a wheelchair)?: A Little Help needed standing up from a chair using your arms (e.g., wheelchair or bedside chair)?: A Little Help needed to walk in hospital room?: A Lot Help needed climbing 3-5 steps with a railing? : Total 6 Click Score: 14    End of Session Equipment Utilized During Treatment: Gait belt Activity Tolerance: Patient tolerated treatment well Patient left: with call bell/phone within reach;with nursing/sitter in room;Other (comment) (on BSC)   PT Visit Diagnosis: Difficulty in walking, not elsewhere classified (R26.2)    Time: 1036-1050 PT Time Calculation (min) (ACUTE ONLY): 14 min   Charges:   PT Evaluation $PT Eval Low Complexity: [place], PT  Acute Rehab Dept ([place]) [phone] Pager [phone]  10/28/2019   [facility] 10/28/2019, 1:01 PM

## 2019-10-28 NOTE — ED Notes (Signed)
Pt resting comfortably, no complaints of pain at this time

## 2019-10-28 NOTE — ED Notes (Signed)
Date and time results received: 10/28/19 8:20 AM  (use smartphrase ".now" to insert current time)  Test: Ca++ Critical Value: 5.9  Name of Provider Notified: Tim, RN  Orders Received? Or Actions Taken?: Actions Taken: Notified primary RN

## 2019-10-28 NOTE — Progress Notes (Addendum)
PROGRESS NOTE    [name]  HBZ:[id number] DOB: 1970/03/24 DOA: 10/27/2019 PCP: Lavone Orn, MD   Brief Narrative:  HPI: [name] is a 49 y.o. male with medical history significant for Crohn's disease, PE not currently on anticoagulation, and depression who presents to the ED for evaluation of right upper and lower extremity weakness and pain.  Patient states he was out drinking last night (10/26/2019) when he got intoxicated on alcohol and also used Storla.  He blacked out and did not recall the events occurring later that night.  He apparently fell onto a brick floor kitchen at his friend's residence landing on the right side of his body and the right side of his face.  He woke up this morning unable to move his right arm or leg and had significant pain throughout his right upper and lower extremities.  He was brought to the ED by his significant other.  Patient reports history of Crohn's disease for which he takes Remicade.  He had a prior abdominal surgery in 2011 for his Crohn's disease.  He says he had a postoperative pulmonary embolus but is no longer on anticoagulation.  He denies using any other medications regularly.  ED Course:  Initial vitals showed BP 63/40, pulse 115, RR 29, temp 97.7 Fahrenheit, SPO2 95% on room air.  Labs significant for potassium >7.5, BUN 31, creatinine 5.26, sodium 147, bicarb 14, serum glucose 96, ALT 1851, AST pending, alk phos 83, total bilirubin 0.7, anion gap 24, WBC 43.5, hemoglobin 19.3, hematocrit 56.4, platelets 276,000, serum ethanol elevated at 24, lactic acid 6.8, CK pending (per nurse, lab needing to dilute several times).  Blood cultures were obtained and pending. SARS-CoV-2 PCR is collected and pending. UDS is positive for cocaine.  CT head/maxillofacial/cervical spine without contrast were negative for acute intracranial abnormality, acute traumatic injury to the cervical spine, no evidence of facial fractures. Mild  osteoarthritic changes of the cervical spine noted.  Portable chest x-ray showed low lung volumes with bibasilar atelectasis, no evidence of pneumothorax, focal consolidation. Right anterior fourth rib fracture is seen and appears remote.  Right ankle x-ray is negative for acute fracture or dislocation.  Right hip x-ray is negative for acute fracture or subluxation of the pelvis or right hip.  MRI brain without contrast shows symmetric restricted diffusion in the globi pallidi, per radiology read most often seen with acute toxic or metabolic insult or hypoxic-ischemic injury. No hemorrhage seen. Punctate acute infarcts in the right septal lobe and right cerebellum are seen. Carboxyhemoglobin was obtained and was 0.4%.  CT abdomen/pelvis without contrast shows small portion of transverse colon and fat-containing anterior umbilical hernia without definite evidence of bowel obstruction or strangulation. Moderate amount of colonic stool seen. Patchy streaky airspace opacity at the right lung base is seen.  Patient was given 4 L normal saline with improvement in blood pressures. EDP discussed with on-call neurology who recommended admission to [facility] for further stroke work-up of possible watershed stroke. EDP also discussed with on-call nephrology who also recommended admission to [facility] but did not feel emergent hemodialysis needed at this time with expected improvement with continued IV fluid resuscitation.  Patient was given 10 units NovoLog with 1 amp D50, Lokelma, calcium gluconate, 10 mg albuterol nebulizer treatment, and 50 mcg IV fentanyl.  The hospitalist service was consulted to admit for further evaluation and management  Assessment & Plan:   Principal Problem:   Acute renal failure due to rhabdomyolysis [facility]) Active  Problems:   Hyperkalemia   Transaminitis   High anion gap metabolic acidosis   Alcohol use   Substance use disorder   Acute cerebral infarction (HCC)    Hypotension   Crohn's disease (HCC)   Lactic acidosis   Rhabdomyolysis with acute renal failure/high anion gap metabolic acidosis: CK >53,202. received 4 L normal saline in ER, nephrology consulted and then was started on bicarb drip. Right upper extremity pain is improved.  No output charted.  Creatinine has worsened today.  Continue current IV fluids.  Monitor CK level and BMP on daily basis.  Hyperkalemia: Secondary to above.  Presented with potassium of > 7.5.  EKG did show peaked T wave changes.  Patient given IV calcium gluconate, insulin/D50, Lokelma, and 10 mg albuterol nebulizer treatment.  Has received 4 L normal saline as well.  Potassium normal today.  Hypocalcemia: 5.9 this morning.  Will order 1 dose of calcium gluconate 2 g.  Recheck in the morning.  Right upper and lower extremity weakness/edema/pain/acute infarction of the right occipital lobe and right cerebellum/watershed stroke/globi pallidi/cocaine and alcohol abuse: Strength is almost absent or 0/5 at his right lower extremity.  Doubt related to CVA as MRI shows right-sided infarcts.  Out of concern of possible compartment syndrome, he was seen by orthopedics and they are not concerned about any compartment syndrome.  Neurology to see patient.  PT OT consulted.  Patient received aspirin in the ER.  Patient awaiting transfer to [facility] for further neurology and nephrology work-up.  Hypotension: Improved after initial IV fluid resuscitation.  Continue IV fluids as above.  Transaminitis: AST 4337, ALT 1851.  Multifactorial from rhabdomyolysis, hypoperfusion due to hypotension, and alcohol use.  He is currently alert and oriented. -Normal acetaminophen level -Repeat hepatic function panel in a.m. -Continue IV fluid hydration and management as above  Leukocytosis: Likely reactive from rhabdomyolysis in addition to hemoconcentration.  Improving now.  No signs of infection.  Crohn's disease: Follows with  [facility], Dr. Havery Moros.  Has been on Remicade monotherapy.  Currently stable.  Hold Remicade.  Alcohol use: Denies any prior history of withdrawal.  Continue CIWA checks q4h.  Will not place on standing Ativan at this time given multiple medical complications as above.  Substance use disorder: UDS positive for cocaine. Patient also reports using Molly.  DVT prophylaxis: heparin injection 5,000 Units Start: 10/27/19 2315   Code Status: Full Code  Family Communication: None present at bedside.  Plan of care discussed with patient in length and he verbalized understanding and agreed with it.  Status is: Inpatient  Remains inpatient appropriate because:Inpatient level of care appropriate due to severity of illness   Dispo: The patient is from: Home              Anticipated d/c is to: Home              Anticipated d/c date is: 3 days              Patient currently is not medically stable to d/c.        Estimated body mass index is 29.43 kg/m as calculated from the following:   Height as of this encounter: 5' 11"  (1.803 m).   Weight as of this encounter: 95.7 kg.      Nutritional status:               Consultants:   Neurology  Orthopedics  Nephrology  Procedures:   None  Antimicrobials:  Anti-infectives (  From admission, onward)   None         Subjective: Patient seen and examined.  Still in the ED.  Feels better than yesterday.  Completely alert and oriented.  Still with right upper extremity pain but better than yesterday.  Able to move right upper extremity more than yesterday.  Still unable to move right lower extremity.  Speech clear and fluent.  No other complaint.  Objective: Vitals:   10/28/19 0658 10/28/19 0808 10/28/19 0900 10/28/19 0948  BP: 140/77 128/76 (!) 145/80 118/85  Pulse: (!) 117 (!) 115 (!) 119 (!) 113  Resp: (!) 23  (!) 27   Temp:  (!) 97 F (36.1 C)    TempSrc:  Oral    SpO2: 94% 96% 96% 95%  Weight:      Height:         Intake/Output Summary (Last 24 hours) at 10/28/2019 1055 Last data filed at 10/27/2019 2321 Gross per 24 hour  Intake 3042.53 ml  Output --  Net 3042.53 ml   Filed Weights   10/27/19 1616  Weight: 95.7 kg    Examination:  General exam: Appears calm and comfortable  Respiratory system: Clear to auscultation. Respiratory effort normal. Cardiovascular system: S1 & S2 heard, RRR. No JVD, murmurs, rubs, gallops or clicks. No pedal edema. Gastrointestinal system: Abdomen is nondistended, soft and nontender. No organomegaly or masses felt. Normal bowel sounds heard. Central nervous system: Alert and oriented.  Normal power in left upper and lower extremity.  1/5 power in right lower extremity and 4/5 power in right upper extremity. Extremities: Symmetric 5 x 5 power. Skin: No rashes, lesions or ulcers Psychiatry: Judgement and insight appear normal. Mood & affect appropriate.    Data Reviewed: I have personally reviewed following labs and imaging studies  CBC: Recent Labs  Lab 10/27/19 1705 10/27/19 2144 10/28/19 0500  WBC 43.5*  --  25.2*  NEUTROABS 37.4*  --   --   HGB 19.3* 13.6 13.9  HCT 56.4* 40.0 42.0  MCV 91.6  --  94.6  PLT 276  --  275*   Basic Metabolic Panel: Recent Labs  Lab 10/27/19 1852 10/27/19 1953 10/27/19 2144 10/28/19 0137 10/28/19 0649  NA 147*  --  140 139 137  K >7.5*  --  7.5* 7.0* 5.0  CL 109  --  114* 110 106  CO2 14*  --   --  13* 15*  GLUCOSE 96  --  102* 137* 143*  BUN 31*  --  38* 37* 46*  CREATININE 5.26* 5.90* 5.50* 5.46* 6.06*  CALCIUM 6.9*  --   --  5.8* 5.9*  PHOS  --   --   --   --  10.1*   GFR: Estimated Creatinine Clearance: 17.6 mL/min (A) (by C-G formula based on SCr of 6.06 mg/dL (H)). Liver Function Tests: Recent Labs  Lab 10/27/19 1852 10/28/19 0649  AST 4,337*  --   ALT 1,851*  --   ALKPHOS 83  --   BILITOT 0.7  --   PROT 7.1  --   ALBUMIN 3.9 3.1*   No results for input(s): LIPASE, AMYLASE in the last 168  hours. No results for input(s): AMMONIA in the last 168 hours. Coagulation Profile: No results for input(s): INR, PROTIME in the last 168 hours. Cardiac Enzymes: Recent Labs  Lab 10/27/19 1852 10/28/19 0137  CKTOTAL >50,000* >50,000*   BNP (last 3 results) No results for input(s): PROBNP in the last 8760 hours. HbA1C:  Recent Labs    10/28/19 0649  HGBA1C 5.1   CBG: Recent Labs  Lab 10/27/19 1600 10/27/19 2259  GLUCAP 97 174*   Lipid Profile: Recent Labs    10/28/19 0649  CHOL 182  HDL 20*  LDLCALC UNABLE TO CALCULATE IF TRIGLYCERIDE OVER 400 mg/dL  TRIG 462*  CHOLHDL 9.1   Thyroid Function Tests: No results for input(s): TSH, T4TOTAL, FREET4, T3FREE, THYROIDAB in the last 72 hours. Anemia Panel: No results for input(s): VITAMINB12, FOLATE, FERRITIN, TIBC, IRON, RETICCTPCT in the last 72 hours. Sepsis Labs: Recent Labs  Lab 10/27/19 1950 10/27/19 2249 10/28/19 0650  LATICACIDVEN 6.8* 5.4* 3.2*    Recent Results (from the past 240 hour(s))  SARS Coronavirus 2 by RT PCR (hospital order, performed in [facility] hospital lab) Nasopharyngeal Nasopharyngeal Swab     Status: None   Collection Time: 10/27/19  8:43 PM   Specimen: Nasopharyngeal Swab  Result Value Ref Range Status   SARS Coronavirus 2 NEGATIVE NEGATIVE Final    Comment: (NOTE) SARS-CoV-2 target nucleic acids are NOT DETECTED.  The SARS-CoV-2 RNA is generally detectable in upper and lower respiratory specimens during the acute phase of infection. The lowest concentration of SARS-CoV-2 viral copies this assay can detect is 250 copies / mL. A negative result does not preclude SARS-CoV-2 infection and should not be used as the sole basis for treatment or other patient management decisions.  A negative result may occur with improper specimen collection / handling, submission of specimen other than nasopharyngeal swab, presence of viral mutation(s) within the areas targeted by this assay, and  inadequate number of viral copies (<250 copies / mL). A negative result must be combined with clinical observations, patient history, and epidemiological information.  Fact Sheet for Patients:   StrictlyIdeas.no  Fact Sheet for Healthcare Providers: BankingDealers.co.za  This test is not yet approved or  cleared by the [place] FDA and has been authorized for detection and/or diagnosis of SARS-CoV-2 by FDA under an Emergency Use Authorization (EUA).  This EUA will remain in effect (meaning this test can be used) for the duration of the COVID-19 declaration under Section 564(b)(1) of the Act, 21 U.S.C. section 360bbb-3(b)(1), unless the authorization is terminated or revoked sooner.  Performed at [facility], [place] [address], [place], [place] [address]       Radiology Studies: CT Abdomen Pelvis Wo Contrast  Result Date: 10/27/2019 CLINICAL DATA:  Abdominal distension and pain EXAM: CT ABDOMEN AND PELVIS WITHOUT CONTRAST TECHNIQUE: Multidetector CT imaging of the abdomen and pelvis was performed following the standard protocol without IV contrast. COMPARISON:  None. FINDINGS: Lower chest: The visualized heart size within normal limits. No pericardial fluid/thickening. No hiatal hernia. Mild streaky patchy airspace opacity seen at the right lung base. Hepatobiliary: Although limited due to the lack of intravenous contrast, normal in appearance without gross focal abnormality. No evidence of calcified gallstones or biliary ductal dilatation. Pancreas:  Unremarkable.  No surrounding inflammatory changes. Spleen: Normal in size. Although limited due to the lack of intravenous contrast, normal in appearance. Adrenals/Urinary Tract: Both adrenal glands appear normal. The kidneys and collecting system appear normal without evidence of urinary tract calculus or hydronephrosis. Bladder is unremarkable. Stomach/Bowel: The stomach,  small bowel, are normal in appearance. There is a small anterior fat and tiny portion of transverse colon containing hernia seen along the anterior abdominal wall. A surgical anastomosis seen within the left distal colon. No areas of focal wall thickening or bowel dilatation  are seen. There is a moderate amount of colonic stool present. Vascular/Lymphatic: There are no enlarged abdominal or pelvic lymph nodes. Scattered mild aortic atherosclerosis is seen. Reproductive: The prostate is unremarkable. Other: No evidence of abdominal wall mass or hernia. Musculoskeletal: No acute or significant osseous findings. Heterotopic ossifications are seen around the right hip. IMPRESSION: Small portion of transverse colon and fat containing anterior umbilical hernia, however no definite evidence bowel obstruction or strangulation. Moderate amount of colonic stool. Patchy streaky airspace opacity at the right lung base which may be due to atelectasis and/or infectious etiology. Aortic Atherosclerosis (ICD10-I70.0). Electronically Signed   By: Prudencio Pair M.D.   On: 10/27/2019 20:50   DG Ankle 2 Views Right  Result Date: 10/27/2019 CLINICAL DATA:  Trauma, fall.  Right ankle pain. EXAM: RIGHT ANKLE - 2 VIEW COMPARISON:  None. FINDINGS: No acute fracture or dislocation. There is slight lateral talar tilt. No mortise widening. No ankle joint effusion. No focal soft tissue abnormality. IMPRESSION: No acute fracture or dislocation. Slight lateral talar tilt. Electronically Signed   By: Keith Rake M.D.   On: 10/27/2019 16:59   CT Head Wo Contrast  Result Date: 10/27/2019 CLINICAL DATA:  Post injury, syncopal episode and fall. EXAM: CT HEAD WITHOUT CONTRAST CT MAXILLOFACIAL WITHOUT CONTRAST CT CERVICAL SPINE WITHOUT CONTRAST TECHNIQUE: Multidetector CT imaging of the head, cervical spine, and maxillofacial structures were performed using the standard protocol without intravenous contrast. Multiplanar CT image  reconstructions of the cervical spine and maxillofacial structures were also generated. COMPARISON:  None. FINDINGS: CT HEAD FINDINGS Brain: No evidence of acute infarction, hemorrhage, hydrocephalus, extra-axial collection or mass lesion/mass effect. Vascular: No hyperdense vessel or unexpected calcification. Skull: Normal. Negative for fracture or focal lesion. Other: None. CT MAXILLOFACIAL FINDINGS Osseous: No fracture or mandibular dislocation. No destructive process. Orbits: Negative. No traumatic or inflammatory finding. Sinuses: Clear. Soft tissues: Negative. CT CERVICAL SPINE FINDINGS Alignment: Normal. Skull base and vertebrae: No acute fracture. No primary bone lesion or focal pathologic process. Soft tissues and spinal canal: No prevertebral fluid or swelling. No visible canal hematoma. Disc levels:  Mild osteoarthritic changes at C2-C3, C3-C4 and C4-C5. Upper chest: Negative. Other: None. IMPRESSION: 1. No acute intracranial abnormality. 2. No evidence of acute traumatic injury to the cervical spine. 3. No evidence of facial fractures. 4. Mild osteoarthritic changes of the cervical spine. Electronically Signed   By: Fidela Salisbury M.D.   On: 10/27/2019 16:51   CT Cervical Spine Wo Contrast  Result Date: 10/27/2019 CLINICAL DATA:  Post injury, syncopal episode and fall. EXAM: CT HEAD WITHOUT CONTRAST CT MAXILLOFACIAL WITHOUT CONTRAST CT CERVICAL SPINE WITHOUT CONTRAST TECHNIQUE: Multidetector CT imaging of the head, cervical spine, and maxillofacial structures were performed using the standard protocol without intravenous contrast. Multiplanar CT image reconstructions of the cervical spine and maxillofacial structures were also generated. COMPARISON:  None. FINDINGS: CT HEAD FINDINGS Brain: No evidence of acute infarction, hemorrhage, hydrocephalus, extra-axial collection or mass lesion/mass effect. Vascular: No hyperdense vessel or unexpected calcification. Skull: Normal. Negative for fracture or  focal lesion. Other: None. CT MAXILLOFACIAL FINDINGS Osseous: No fracture or mandibular dislocation. No destructive process. Orbits: Negative. No traumatic or inflammatory finding. Sinuses: Clear. Soft tissues: Negative. CT CERVICAL SPINE FINDINGS Alignment: Normal. Skull base and vertebrae: No acute fracture. No primary bone lesion or focal pathologic process. Soft tissues and spinal canal: No prevertebral fluid or swelling. No visible canal hematoma. Disc levels:  Mild osteoarthritic changes at C2-C3, C3-C4 and C4-C5. Upper chest:  Negative. Other: None. IMPRESSION: 1. No acute intracranial abnormality. 2. No evidence of acute traumatic injury to the cervical spine. 3. No evidence of facial fractures. 4. Mild osteoarthritic changes of the cervical spine. Electronically Signed   By: Fidela Salisbury M.D.   On: 10/27/2019 16:51   MR Brain Wo Contrast (neuro protocol)  Result Date: 10/27/2019 CLINICAL DATA:  Syncope.  Right-sided weakness. EXAM: MRI HEAD WITHOUT CONTRAST TECHNIQUE: Multiplanar, multiecho pulse sequences of the brain and surrounding structures were obtained without intravenous contrast. COMPARISON:  Head CT 10/27/2019 FINDINGS: Brain: There is symmetric restricted diffusion and T2 hyperintensity in the globi pallidi. There are also 2 subcentimeter foci of restricted diffusion in the right occipital lobe and a single punctate focus of restricted diffusion in the right cerebellum. The brain is normal in signal elsewhere. No intracranial hemorrhage, mass, midline shift, or extra-axial fluid collection is identified. The ventricles and sulci are normal. Vascular: Major intracranial vascular flow voids are preserved. Skull and upper cervical spine: Unremarkable bone marrow signal. Sinuses/Orbits: Unremarkable orbits. Paranasal sinuses and mastoid air cells are clear. Other: None. IMPRESSION: 1. Symmetric restricted diffusion in the globi pallidi. This is most often seen with an acute toxic or  metabolic insult (including carbon monoxide poisoning and drugs of abuse) or hypoxic-ischemic injury. No hemorrhage. 2. Punctate acute infarcts in the right occipital lobe and right cerebellum. Electronically Signed   By: Logan Bores M.D.   On: 10/27/2019 19:01   US RENAL  Result Date: 10/28/2019 CLINICAL DATA:  Acute kidney injury EXAM: RENAL / URINARY TRACT ULTRASOUND COMPLETE COMPARISON:  CT abdomen/pelvis dated 10/27/2019 FINDINGS: Right Kidney: Renal measurements: 11.5 x 6.3 x 6.8 cm = volume: 257 mL. Echogenicity within normal limits. No mass or hydronephrosis visualized. Left Kidney: Renal measurements: 10.0 x 7.0 x 6.1 cm = volume: 223 mL. Echogenicity within normal limits. No mass or hydronephrosis visualized. Bladder: Appears normal for degree of bladder distention. Other: None. IMPRESSION: Negative renal ultrasound.  No hydronephrosis. Electronically Signed   By: Julian Hy M.D.   On: 10/28/2019 08:40   DG Chest Port 1 View  Result Date: 10/27/2019 CLINICAL DATA:  Trauma.  Fall onto right side. EXAM: PORTABLE CHEST 1 VIEW COMPARISON:  Chest radiograph 03/06/2010, no interval imaging available. FINDINGS: Lung volumes are low. Mild elevation of right hemidiaphragm. Upper normal heart size likely accentuated by technique. No evidence of pneumothorax. Streaky opacities at the right greater than left lung base typical of atelectasis. No acute osseous abnormalities are seen. Right anterior fourth rib fracture appears remote. IMPRESSION: Low lung volumes with bibasilar atelectasis. No evidence of acute traumatic injury. Electronically Signed   By: Keith Rake M.D.   On: 10/27/2019 16:55   DG HIP UNILAT WITH PELVIS 2-3 VIEWS RIGHT  Result Date: 10/27/2019 CLINICAL DATA:  Fall onto right side.  History of right hip surgery. EXAM: DG HIP (WITH OR WITHOUT PELVIS) 2-3V RIGHT COMPARISON:  Remote radiograph 06/28/2009 FINDINGS: No evidence of acute fracture. There is heterotopic ossification about  the right hip. This appears increased from prior exam. Femoral head is seated in the acetabulum. Pubic rami are intact. Pubic symphysis and sacroiliac joints are congruent. There is slight flattening of the left femoral head neck junction that is unchanged from prior. IMPRESSION: No acute fracture or subluxation of the pelvis or right hip. Electronically Signed   By: Keith Rake M.D.   On: 10/27/2019 16:57   CT Maxillofacial WO CM  Result Date: 10/27/2019 CLINICAL DATA:  Post injury,  syncopal episode and fall. EXAM: CT HEAD WITHOUT CONTRAST CT MAXILLOFACIAL WITHOUT CONTRAST CT CERVICAL SPINE WITHOUT CONTRAST TECHNIQUE: Multidetector CT imaging of the head, cervical spine, and maxillofacial structures were performed using the standard protocol without intravenous contrast. Multiplanar CT image reconstructions of the cervical spine and maxillofacial structures were also generated. COMPARISON:  None. FINDINGS: CT HEAD FINDINGS Brain: No evidence of acute infarction, hemorrhage, hydrocephalus, extra-axial collection or mass lesion/mass effect. Vascular: No hyperdense vessel or unexpected calcification. Skull: Normal. Negative for fracture or focal lesion. Other: None. CT MAXILLOFACIAL FINDINGS Osseous: No fracture or mandibular dislocation. No destructive process. Orbits: Negative. No traumatic or inflammatory finding. Sinuses: Clear. Soft tissues: Negative. CT CERVICAL SPINE FINDINGS Alignment: Normal. Skull base and vertebrae: No acute fracture. No primary bone lesion or focal pathologic process. Soft tissues and spinal canal: No prevertebral fluid or swelling. No visible canal hematoma. Disc levels:  Mild osteoarthritic changes at C2-C3, C3-C4 and C4-C5. Upper chest: Negative. Other: None. IMPRESSION: 1. No acute intracranial abnormality. 2. No evidence of acute traumatic injury to the cervical spine. 3. No evidence of facial fractures. 4. Mild osteoarthritic changes of the cervical spine. Electronically  Signed   By: Fidela Salisbury M.D.   On: 10/27/2019 16:51    Scheduled Meds: .  stroke: mapping our early stages of recovery book   Does not apply Once  . heparin  5,000 Units Subcutaneous Q8H  . sodium zirconium cyclosilicate  10 g Oral TID   Continuous Infusions: . calcium gluconate    . sodium bicarbonate in D5W 1000 mL infusion 175 mL/hr at 10/28/19 0819     LOS: 1 day   Time spent: 40 minutes   Darliss Cheney, MD [facility]  10/28/2019, 10:55 AM   To contact the attending provider between 7A-7P or the covering provider during after hours 7P-7A, please log into the web site [email or web address].

## 2019-10-28 NOTE — Progress Notes (Signed)
[facility] Progress Note  Subjective: pt seen in ED at [facility], no c/o's. Not voiding much. Nothing recorded by staff. No sob or confusion. Am labs show K+ down to 5.0.   Vitals:   10/28/19 1215 10/28/19 1216 10/28/19 1322 10/28/19 1522  BP:  (!) 146/76 (!) 141/81 129/85  Pulse: (!) 114 (!) 114 (!) 113 (!) 112  Resp: 19 18 18  (!) 22  Temp:   (!) 97.3 F (36.3 C) 97.8 F (36.6 C)  TempSrc:   Oral Oral  SpO2: 95% 94% 94% 94%  Weight:      Height:        Exam: Gen alert, no distress , Ox 3 No jvd or bruits Chest clear bilat to bases RRR no MRG Abd soft ntnd no mass or ascites R leg and arm w/ tense edema, no edema on L side  Ext no wounds or ulcers Neuro is alert, Ox 3 , nf    Home meds:  - remicade 400 IV every 8 wks  - ciagra 50 prn/ claritin 10 prn  - prn's/ vitamins/ supplements     UDS + cocaine     CT abd 9/04 > Adrenals/Urinary Tract: Both adrenal glands appear normal. The kidneys and collecting system appear normal without evidence of urinary tract calculus or hydronephrosis. Bladder is unremarkable. Otherwise >> IMPRESSION: small portion of transverse colon and fat containing anterior umbilical hernia, however no definite evidence bowel obstruction or strangulation. Moderate amount of colonic stool. Patchy streaky airspace opacity at the right lung base which may be due to atelectasis and/or infectious etiology.     MRI brain 9/04 > IMPRESSION: 1. Symmetric restricted diffusion in the globi pallidi. This is most often seen with an acute toxic or metabolic insult (including carbon monoxide poisoning and drugs of abuse) or hypoxic-ischemic injury. No hemorrhage. 2. Punctate acute infarcts in the R occipital lobe and R cerebellum.    CXR 10/27/19 - IMPRESSION: Low lung volumes with bibasilar atelectasis. No evidence of acute traumatic injury         Assessment/ Plan: 1. AKI - suspect rhabdomyolysis w/ swollen R leg/ arm, AKI and high K+ in pt who was  unconscious on the ground for too long of a time. CPK > 50K.  AKI due to hypotension/ hypovolemia and rhabdomyolysis. Had 4L bolus in ED on 9/3. Creat up some today to mid 6's. No uremic signs. K+ down to 5.0 this am, will repeat mid day. Cont IVF"s 100- 150 cc/hr for now, frequent exam checks to avoid vol overload. Hopefully won't need HD. Place foley for now. No need for RRT at this time.  2. Hyperkalemia - K+ 7's on admit > 5.0 today. Better. Still at risk, will cont lokelma at lowered dose of 10 bid, dc when K+ < 4.5 3. Drug abuse 4. H/o Crohn's - hx colostomy 2011, reversal in 2012       Rob Kyli Sorter 10/28/2019, 4:15 PM   Recent Labs  Lab 10/27/19 2144 10/27/19 2144 10/28/19 0137 10/28/19 0500 10/28/19 0649  K 7.5*   < > 7.0*  --  5.0  BUN 38*   < > 37*  --  46*  CREATININE 5.50*   < > 5.46*  --  6.06*  CALCIUM  --    < > 5.8*  --  5.9*  PHOS  --   --   --   --  10.1*  HGB 13.6  --   --  13.9  --    < > =  values in this interval not displayed.   Inpatient medications: .  stroke: mapping our early stages of recovery book   Does not apply Once  . Chlorhexidine Gluconate Cloth  6 each Topical Daily  . heparin  5,000 Units Subcutaneous Q8H  . sodium zirconium cyclosilicate  10 g Oral BID   . sodium bicarbonate in D5W 1000 mL infusion 150 mL/hr at 10/28/19 1520   HYDROmorphone (DILAUDID) injection, ondansetron **OR** ondansetron (ZOFRAN) IV, senna-docusate

## 2019-10-28 NOTE — Progress Notes (Signed)
Patient off floor to go to MRI. Central monitoring notified. IV saline locked. VSS.

## 2019-10-28 NOTE — Progress Notes (Signed)
OT Cancellation Note  Patient Details Name: [name] MRN: [id number] DOB: 01-May-1970   Cancelled Treatment:    Reason Eval/Treat Not Completed: Other (comment) Patient with elevated potassium of 7.0 this AM. Will check back as schedule permits.   [name] OT OT pager: [phone]   [name] 10/28/2019, 6:53 AM

## 2019-10-28 NOTE — ED Notes (Addendum)
Sent page to Dr. Doristine Bosworth about pts Ca++ level

## 2019-10-28 NOTE — Consult Note (Signed)
Neurology Consultation Reason for Consult: Abnormal MRI Referring Physician: Kendal Hymen  CC: Right-sided weakness  History is obtained from: Patient, chart  HPI: [name] is a 49 y.o. male with a history of Crohn's colitis who was in his normal state of health when he was partying Friday night.  He had a fair bit to drink and took what he thought was ecstasy.  He then passed out for multiple hours.  When he awoke he was taken home by his wife.  On awakening, he had significant pain in his right arm and leg and had trouble moving them.  Due to this he sought care in the emergency department where an MRI was performed which shows bilateral basal ganglia insult as well as posterior watershed infarct in the right.  Since that time, has had significant pain and weakness of his right side which has not changed much.  He has elevated CK and orthopedic surgery was consulted for concern for compartment syndrome.     ROS: A 14 point ROS was performed and is negative except as noted in the HPI.   Past Medical History:  Diagnosis Date  . Allergy   . Crohn's colitis ([place])   . Depression   . Pulmonary embolism ([facility])      Family History  Problem Relation Age of Onset  . Colon polyps Father   . Multiple sclerosis Sister   . Colon cancer Neg Hx   . Esophageal cancer Neg Hx   . Stomach cancer Neg Hx   . Pancreatic cancer Neg Hx   . Liver disease Neg Hx      Social History:  reports that he has been smoking. He has been smoking about 0.50 packs per day. He has never used smokeless tobacco. He reports current alcohol use. He reports that he does not use drugs.   Exam: Current vital signs: BP 132/80 (BP Location: Left Arm)   Pulse (!) 112   Temp 98.1 F (36.7 C) (Oral)   Resp 20   Ht 5' 11"  (1.803 m)   Wt 95.7 kg   SpO2 96%   BMI 29.43 kg/m  Vital signs in last 24 hours: Temp:  [97 F (36.1 C)-98.2 F (36.8 C)] 98.1 F (36.7 C) (09/05 1900) Pulse Rate:  [109-125] 112  (09/05 1900) Resp:  [18-33] 20 (09/05 1900) BP: (95-148)/(44-85) 132/80 (09/05 1900) SpO2:  [91 %-97 %] 96 % (09/05 1900)   Physical Exam  Constitutional: Appears well-developed and well-nourished.  Psych: Affect appropriate to situation Eyes: No scleral injection HENT: No OP obstrucion MSK: no joint deformities.  Cardiovascular: Normal rate and regular rhythm.  Respiratory: Effort normal, non-labored breathing GI: Soft.  No distension. There is no tenderness.  Skin: WDI  Neuro: Mental Status: Patient is awake, alert, oriented to person, place, month, year, and situation. Patient is able to give a clear and coherent history. No signs of aphasia or neglect Cranial Nerves: II: Visual Fields are full. Pupils are equal, round, and reactive to light.   III,IV, VI: EOMI without ptosis or diploplia.  V: Facial sensation is symmetric to temperature VII: Facial movement is symmetric.  VIII: hearing is intact to voice X: Uvula elevates symmetrically XI: Shoulder shrug is symmetric. XII: tongue is midline without atrophy or fasciculations.  Motor: He has good strength in his left upper extremity, somewhat limited due to pain in his left lower extremity, his right arm has some weakness of the triceps, good wrist extension, good grip and finger  extension.  He has 2/5 knee extension, 1/5 foot dorsi/plantar flexion, no movement in the big toe. Sensory: Sensation is diminished on the lateral aspect of his right lower leg as well as foot, essentially peroneal distribution. Deep Tendon Reflexes: Absent throughout Plantars: Toes are downgoing bilaterally.  Cerebellar: Does not perform  I have reviewed labs in epic and the results pertinent to this consultation are: Calcium 5.9(from 16:35 after 2 g load)  Cr 7.26  I have reviewed the images obtained: MRI brain-restricted diffusion in bilateral globi pallidi, as well as posterior circulation infarcts on the right  Impression: 49 year old  male with cerebral injury likely due to overdose and prolonged downtime.  He suspected that he was taking ecstasy, but his urine drug screen was positive for cocaine.  Cocaine/fentanyl has been seen more commonly, and my suspicion is that he likely had fentanyl as part of his overdose.  I have seen the bilateral globus pallidus eye injury pattern and narcotic overdose much more commonly than cocaine.  Carboxyhemoglobin was not suggestive of carbon monoxide, but I still encouraged him to have his friend get a carbon monoxide monitor.  Treatment for this will be supportive.  His right-sided weakness I agree with orthopedics is likely peripheral in nature, secondary to being down for a prolonged time.  His hyporeflexia is more likely due to his hypocalcemia, however, and given that this may be masking hyperreflexia, I would favor evaluating his cervical spine as well.  With the numbness fitting peroneal distribution, it is possible this is the source of his numbness and his weakness is more due to his rhabdomyolysis which is likely due to his downtime.  I would favor however, getting some imaging of his lumbar spine as well.  If this represents neurapraxia due to his downtime/inflammation, it may improve relatively rapidly, but if there was actual injury to the nerve, then will have to regrow from the side of injury which could be weeks to months.  His strokes are posterior watershed and could be possibly due to his severe hypotension, I am not certain of the right cerebellar stroke, as it is punctate and I think could represent artifact.  In any case, however with the unilateral nature, he needs vascular imaging, and I would favor starting antiplatelet therapy with low-dose aspirin.  Recommendations: 1) MRI cervical spine and lumbar spine 2) MRA head and neck, echocardiogram, lipid panel 3) treatment of hypocalcemia per internal medicine 4) monitoring for compartment syndrome per orthopedic surgery 5)  recommend cessation of drugs of abuse 6) consider EMG as an outpatient if he continues to be weak. 7) Neurology will follow.    Roland Rack, MD [facility] [phone]  If 7pm- 7am, please page neurology on call as listed in [place].

## 2019-10-28 NOTE — Consult Note (Addendum)
ORTHOPAEDIC CONSULTATION  REQUESTING PHYSICIAN: Lenore Cordia, MD  PCP:  Lavone Orn, MD  Chief Complaint: Right leg pain  HPI: [name] is a 49 y.o. male who complains of right leg pain as well as right upper extremity weakness and right leg weakness. Briefly, [name] is a 49 year old right-hand-dominant male who states that he passed out intoxicated/high on the morning of September 4 at approximately 3 AM. He states that he was likely down for four or 5 hours. He states his wife took him home. While there he noted increasing pain in the right leg and ankle. He was having inability to bear weight or activate any motor function of the right leg. He also had extreme weakness in the right arm. He presented to the emergency department this afternoon around 4 PM. On his work-up here in the ED he has been noted to have acute rhabdomyolysis with acute renal failure. On his exam he has deficits in motor function on the right leg and right upper extremity. I was consulted to assess and evaluate for any potential impending or developing compartment syndrome.  [name] does deny diabetes. He states he is independent with ADLs. He is gainfully employed.  Past Medical History:  Diagnosis Date  . Allergy   . Crohn's colitis ([place])   . Depression   . Pulmonary embolism [facility])    Past Surgical History:  Procedure Laterality Date  . COLOSTOMY  March 2011  . Colostomy reversal  January 2012  . HIP SURGERY  R hip surgery    Social History   Socioeconomic History  . Marital status: Married    Spouse name: Not on file  . Number of children: 1  . Years of education: Not on file  . Highest education level: Not on file  Occupational History  . Not on file  Tobacco Use  . Smoking status: Current Every Day Smoker    Packs/day: 0.50    Last attempt to quit: 07/29/2016    Years since quitting: 3.2  . Smokeless tobacco: Never Used  Substance and Sexual Activity  . Alcohol use: Yes     Comment: 1 per day  . Drug use: No  . Sexual activity: Yes  Other Topics Concern  . Not on file  Social History Narrative  . Not on file   Social Determinants of Health   Financial Resource Strain:   . Difficulty of Paying Living Expenses: Not on file  Food Insecurity:   . Worried About Charity fundraiser in the Last Year: Not on file  . Ran Out of Food in the Last Year: Not on file  Transportation Needs:   . Lack of Transportation (Medical): Not on file  . Lack of Transportation (Non-Medical): Not on file  Physical Activity:   . Days of Exercise per Week: Not on file  . Minutes of Exercise per Session: Not on file  Stress:   . Feeling of Stress : Not on file  Social Connections:   . Frequency of Communication with Friends and Family: Not on file  . Frequency of Social Gatherings with Friends and Family: Not on file  . Attends Religious Services: Not on file  . Active Member of Clubs or Organizations: Not on file  . Attends Archivist Meetings: Not on file  . Marital Status: Not on file   Family History  Problem Relation Age of Onset  . Colon polyps Father   . Multiple sclerosis Sister   .  Colon cancer Neg Hx   . Esophageal cancer Neg Hx   . Stomach cancer Neg Hx   . Pancreatic cancer Neg Hx   . Liver disease Neg Hx    No Known Allergies Prior to Admission medications   Medication Sig Start Date End Date Taking? Authorizing Provider  acetaminophen (TYLENOL) 500 MG tablet Take 1,500 mg by mouth 2 (two) times daily as needed for mild pain or moderate pain.    Yes [provider]  inFLIXimab (REMICADE) 100 MG injection Inject 400 mg into the vein every 8 (eight) weeks.   Yes [provider]  loratadine (CLARITIN) 10 MG tablet Take 10 mg by mouth as needed for allergies.    [provider]  sildenafil (VIAGRA) 50 MG tablet Take 50 mg by mouth daily as needed for erectile dysfunction.    [provider]   CT Abdomen Pelvis Wo  Contrast  Result Date: 10/27/2019 CLINICAL DATA:  Abdominal distension and pain EXAM: CT ABDOMEN AND PELVIS WITHOUT CONTRAST TECHNIQUE: Multidetector CT imaging of the abdomen and pelvis was performed following the standard protocol without IV contrast. COMPARISON:  None. FINDINGS: Lower chest: The visualized heart size within normal limits. No pericardial fluid/thickening. No hiatal hernia. Mild streaky patchy airspace opacity seen at the right lung base. Hepatobiliary: Although limited due to the lack of intravenous contrast, normal in appearance without gross focal abnormality. No evidence of calcified gallstones or biliary ductal dilatation. Pancreas:  Unremarkable.  No surrounding inflammatory changes. Spleen: Normal in size. Although limited due to the lack of intravenous contrast, normal in appearance. Adrenals/Urinary Tract: Both adrenal glands appear normal. The kidneys and collecting system appear normal without evidence of urinary tract calculus or hydronephrosis. Bladder is unremarkable. Stomach/Bowel: The stomach, small bowel, are normal in appearance. There is a small anterior fat and tiny portion of transverse colon containing hernia seen along the anterior abdominal wall. A surgical anastomosis seen within the left distal colon. No areas of focal wall thickening or bowel dilatation are seen. There is a moderate amount of colonic stool present. Vascular/Lymphatic: There are no enlarged abdominal or pelvic lymph nodes. Scattered mild aortic atherosclerosis is seen. Reproductive: The prostate is unremarkable. Other: No evidence of abdominal wall mass or hernia. Musculoskeletal: No acute or significant osseous findings. Heterotopic ossifications are seen around the right hip. IMPRESSION: Small portion of transverse colon and fat containing anterior umbilical hernia, however no definite evidence bowel obstruction or strangulation. Moderate amount of colonic stool. Patchy streaky airspace opacity at the  right lung base which may be due to atelectasis and/or infectious etiology. Aortic Atherosclerosis (ICD10-I70.0). Electronically Signed   By: Prudencio Pair M.D.   On: 10/27/2019 20:50   DG Ankle 2 Views Right  Result Date: 10/27/2019 CLINICAL DATA:  Trauma, fall.  Right ankle pain. EXAM: RIGHT ANKLE - 2 VIEW COMPARISON:  None. FINDINGS: No acute fracture or dislocation. There is slight lateral talar tilt. No mortise widening. No ankle joint effusion. No focal soft tissue abnormality. IMPRESSION: No acute fracture or dislocation. Slight lateral talar tilt. Electronically Signed   By: Keith Rake M.D.   On: 10/27/2019 16:59   CT Head Wo Contrast  Result Date: 10/27/2019 CLINICAL DATA:  Post injury, syncopal episode and fall. EXAM: CT HEAD WITHOUT CONTRAST CT MAXILLOFACIAL WITHOUT CONTRAST CT CERVICAL SPINE WITHOUT CONTRAST TECHNIQUE: Multidetector CT imaging of the head, cervical spine, and maxillofacial structures were performed using the standard protocol without intravenous contrast. Multiplanar CT image reconstructions of the cervical  spine and maxillofacial structures were also generated. COMPARISON:  None. FINDINGS: CT HEAD FINDINGS Brain: No evidence of acute infarction, hemorrhage, hydrocephalus, extra-axial collection or mass lesion/mass effect. Vascular: No hyperdense vessel or unexpected calcification. Skull: Normal. Negative for fracture or focal lesion. Other: None. CT MAXILLOFACIAL FINDINGS Osseous: No fracture or mandibular dislocation. No destructive process. Orbits: Negative. No traumatic or inflammatory finding. Sinuses: Clear. Soft tissues: Negative. CT CERVICAL SPINE FINDINGS Alignment: Normal. Skull base and vertebrae: No acute fracture. No primary bone lesion or focal pathologic process. Soft tissues and spinal canal: No prevertebral fluid or swelling. No visible canal hematoma. Disc levels:  Mild osteoarthritic changes at C2-C3, C3-C4 and C4-C5. Upper chest: Negative. Other: None.  IMPRESSION: 1. No acute intracranial abnormality. 2. No evidence of acute traumatic injury to the cervical spine. 3. No evidence of facial fractures. 4. Mild osteoarthritic changes of the cervical spine. Electronically Signed   By: Fidela Salisbury M.D.   On: 10/27/2019 16:51   CT Cervical Spine Wo Contrast  Result Date: 10/27/2019 CLINICAL DATA:  Post injury, syncopal episode and fall. EXAM: CT HEAD WITHOUT CONTRAST CT MAXILLOFACIAL WITHOUT CONTRAST CT CERVICAL SPINE WITHOUT CONTRAST TECHNIQUE: Multidetector CT imaging of the head, cervical spine, and maxillofacial structures were performed using the standard protocol without intravenous contrast. Multiplanar CT image reconstructions of the cervical spine and maxillofacial structures were also generated. COMPARISON:  None. FINDINGS: CT HEAD FINDINGS Brain: No evidence of acute infarction, hemorrhage, hydrocephalus, extra-axial collection or mass lesion/mass effect. Vascular: No hyperdense vessel or unexpected calcification. Skull: Normal. Negative for fracture or focal lesion. Other: None. CT MAXILLOFACIAL FINDINGS Osseous: No fracture or mandibular dislocation. No destructive process. Orbits: Negative. No traumatic or inflammatory finding. Sinuses: Clear. Soft tissues: Negative. CT CERVICAL SPINE FINDINGS Alignment: Normal. Skull base and vertebrae: No acute fracture. No primary bone lesion or focal pathologic process. Soft tissues and spinal canal: No prevertebral fluid or swelling. No visible canal hematoma. Disc levels:  Mild osteoarthritic changes at C2-C3, C3-C4 and C4-C5. Upper chest: Negative. Other: None. IMPRESSION: 1. No acute intracranial abnormality. 2. No evidence of acute traumatic injury to the cervical spine. 3. No evidence of facial fractures. 4. Mild osteoarthritic changes of the cervical spine. Electronically Signed   By: Fidela Salisbury M.D.   On: 10/27/2019 16:51   MR Brain Wo Contrast (neuro protocol)  Result Date:  10/27/2019 CLINICAL DATA:  Syncope.  Right-sided weakness. EXAM: MRI HEAD WITHOUT CONTRAST TECHNIQUE: Multiplanar, multiecho pulse sequences of the brain and surrounding structures were obtained without intravenous contrast. COMPARISON:  Head CT 10/27/2019 FINDINGS: Brain: There is symmetric restricted diffusion and T2 hyperintensity in the globi pallidi. There are also 2 subcentimeter foci of restricted diffusion in the right occipital lobe and a single punctate focus of restricted diffusion in the right cerebellum. The brain is normal in signal elsewhere. No intracranial hemorrhage, mass, midline shift, or extra-axial fluid collection is identified. The ventricles and sulci are normal. Vascular: Major intracranial vascular flow voids are preserved. Skull and upper cervical spine: Unremarkable bone marrow signal. Sinuses/Orbits: Unremarkable orbits. Paranasal sinuses and mastoid air cells are clear. Other: None. IMPRESSION: 1. Symmetric restricted diffusion in the globi pallidi. This is most often seen with an acute toxic or metabolic insult (including carbon monoxide poisoning and drugs of abuse) or hypoxic-ischemic injury. No hemorrhage. 2. Punctate acute infarcts in the right occipital lobe and right cerebellum. Electronically Signed   By: Logan Bores M.D.   On: 10/27/2019 19:01   DG Chest Port 1  View  Result Date: 10/27/2019 CLINICAL DATA:  Trauma.  Fall onto right side. EXAM: PORTABLE CHEST 1 VIEW COMPARISON:  Chest radiograph 03/06/2010, no interval imaging available. FINDINGS: Lung volumes are low. Mild elevation of right hemidiaphragm. Upper normal heart size likely accentuated by technique. No evidence of pneumothorax. Streaky opacities at the right greater than left lung base typical of atelectasis. No acute osseous abnormalities are seen. Right anterior fourth rib fracture appears remote. IMPRESSION: Low lung volumes with bibasilar atelectasis. No evidence of acute traumatic injury. Electronically  Signed   By: Keith Rake M.D.   On: 10/27/2019 16:55   DG HIP UNILAT WITH PELVIS 2-3 VIEWS RIGHT  Result Date: 10/27/2019 CLINICAL DATA:  Fall onto right side.  History of right hip surgery. EXAM: DG HIP (WITH OR WITHOUT PELVIS) 2-3V RIGHT COMPARISON:  Remote radiograph 06/28/2009 FINDINGS: No evidence of acute fracture. There is heterotopic ossification about the right hip. This appears increased from prior exam. Femoral head is seated in the acetabulum. Pubic rami are intact. Pubic symphysis and sacroiliac joints are congruent. There is slight flattening of the left femoral head neck junction that is unchanged from prior. IMPRESSION: No acute fracture or subluxation of the pelvis or right hip. Electronically Signed   By: Keith Rake M.D.   On: 10/27/2019 16:57   CT Maxillofacial WO CM  Result Date: 10/27/2019 CLINICAL DATA:  Post injury, syncopal episode and fall. EXAM: CT HEAD WITHOUT CONTRAST CT MAXILLOFACIAL WITHOUT CONTRAST CT CERVICAL SPINE WITHOUT CONTRAST TECHNIQUE: Multidetector CT imaging of the head, cervical spine, and maxillofacial structures were performed using the standard protocol without intravenous contrast. Multiplanar CT image reconstructions of the cervical spine and maxillofacial structures were also generated. COMPARISON:  None. FINDINGS: CT HEAD FINDINGS Brain: No evidence of acute infarction, hemorrhage, hydrocephalus, extra-axial collection or mass lesion/mass effect. Vascular: No hyperdense vessel or unexpected calcification. Skull: Normal. Negative for fracture or focal lesion. Other: None. CT MAXILLOFACIAL FINDINGS Osseous: No fracture or mandibular dislocation. No destructive process. Orbits: Negative. No traumatic or inflammatory finding. Sinuses: Clear. Soft tissues: Negative. CT CERVICAL SPINE FINDINGS Alignment: Normal. Skull base and vertebrae: No acute fracture. No primary bone lesion or focal pathologic process. Soft tissues and spinal canal: No prevertebral  fluid or swelling. No visible canal hematoma. Disc levels:  Mild osteoarthritic changes at C2-C3, C3-C4 and C4-C5. Upper chest: Negative. Other: None. IMPRESSION: 1. No acute intracranial abnormality. 2. No evidence of acute traumatic injury to the cervical spine. 3. No evidence of facial fractures. 4. Mild osteoarthritic changes of the cervical spine. Electronically Signed   By: Fidela Salisbury M.D.   On: 10/27/2019 16:51    Positive ROS: All other systems have been reviewed and were otherwise negative with the exception of those mentioned in the HPI and as above.  Physical Exam: General: Alert, no acute distress Cardiovascular: No pedal edema Respiratory: No cyanosis, no use of accessory musculature GI: No organomegaly, abdomen is soft and non-tender Skin: No lesions in the area of chief complaint Neurologic: Sensation intact distally Psychiatric: Patient is competent for consent with normal mood and affect Lymphatic: No axillary or cervical lymphadenopathy  MUSCULOSKELETAL:  Right upper extremity:  His compartments are all soft. No pain with passive motion. He has good palpable 2+ radial pulse. He has decreased sensation of the radial nerve and median nerve. He is intact in the ulnar nerve. Globally 4-5 strength in the right arm. Activates all muscles though.  Right lower extremity:  His compartments are soft. He  has no overlying skin changes. He has no pain with passive stretch at the knee, ankle, or toes. He has faint motor activation of quadriceps. Otherwise distally he has no motor with ankle dorsiflexion or plantarflexion or toe extension or flexion. He endorses sensation intact light touch in the femoral nerve as well as the saphenous nerve. Otherwise he denies ability to feel sensation in the sural, tibial, deep and superficial peroneal nerves. 2+ dorsalis pedis pulse and posterior tibialis pulse.  Assessment: Acute neurologic palsy secondary to alcohol induced, and acute  setting.  Plan: -At this time I have no concern for any impending or developing compartment syndrome in the right upper extremity or right lower extremity. His presentation is more concerning for peripheral neurologic issue. My top differential diagnosis would be an acute peripheral neuropathy/neuropraxia due to compression while he was experiencing his acute substance induced psychosis early yesterday morning. His complaints of pain are consistent with dysesthesias. At this time I would recommend just monitoring for recovery of function. May be a candidate for EMG if he has failure to improve.   -His other issues were clearly the acute renal injury and lactic acidosis with possible need for acute dialysis as the rhabdomyolysis evolves.  -Call us with any questions.    Nicholes Stairs, MD Cell [phone]    10/28/2019 12:40 AM

## 2019-10-29 ENCOUNTER — Inpatient Hospital Stay (HOSPITAL_COMMUNITY): Payer: BC Managed Care – PPO

## 2019-10-29 DIAGNOSIS — I6389 Other cerebral infarction: Secondary | ICD-10-CM

## 2019-10-29 DIAGNOSIS — E875 Hyperkalemia: Secondary | ICD-10-CM

## 2019-10-29 DIAGNOSIS — G5731 Lesion of lateral popliteal nerve, right lower limb: Secondary | ICD-10-CM

## 2019-10-29 DIAGNOSIS — Z7289 Other problems related to lifestyle: Secondary | ICD-10-CM

## 2019-10-29 DIAGNOSIS — E872 Acidosis: Secondary | ICD-10-CM

## 2019-10-29 DIAGNOSIS — F1990 Other psychoactive substance use, unspecified, uncomplicated: Secondary | ICD-10-CM

## 2019-10-29 DIAGNOSIS — R7401 Elevation of levels of liver transaminase levels: Secondary | ICD-10-CM

## 2019-10-29 LAB — CBC WITH DIFFERENTIAL/PLATELET
Abs Immature Granulocytes: 0.32 10*3/uL — ABNORMAL HIGH (ref 0.00–0.07)
Basophils Absolute: 0 10*3/uL (ref 0.0–0.1)
Basophils Relative: 0 %
Eosinophils Absolute: 0 10*3/uL (ref 0.0–0.5)
Eosinophils Relative: 0 %
HCT: 38.6 % — ABNORMAL LOW (ref 39.0–52.0)
Hemoglobin: 13.1 g/dL (ref 13.0–17.0)
Immature Granulocytes: 2 %
Lymphocytes Relative: 5 %
Lymphs Abs: 0.8 10*3/uL (ref 0.7–4.0)
MCH: 31 pg (ref 26.0–34.0)
MCHC: 33.9 g/dL (ref 30.0–36.0)
MCV: 91.3 fL (ref 80.0–100.0)
Monocytes Absolute: 0.7 10*3/uL (ref 0.1–1.0)
Monocytes Relative: 4 %
Neutro Abs: 16.5 10*3/uL — ABNORMAL HIGH (ref 1.7–7.7)
Neutrophils Relative %: 89 %
Platelets: 137 10*3/uL — ABNORMAL LOW (ref 150–400)
RBC: 4.23 MIL/uL (ref 4.22–5.81)
RDW: 13.2 % (ref 11.5–15.5)
WBC: 18.5 10*3/uL — ABNORMAL HIGH (ref 4.0–10.5)
nRBC: 0 % (ref 0.0–0.2)

## 2019-10-29 LAB — CBC
HCT: 35.9 % — ABNORMAL LOW (ref 39.0–52.0)
Hemoglobin: 11.8 g/dL — ABNORMAL LOW (ref 13.0–17.0)
MCH: 29.9 pg (ref 26.0–34.0)
MCHC: 32.9 g/dL (ref 30.0–36.0)
MCV: 90.9 fL (ref 80.0–100.0)
Platelets: 125 10*3/uL — ABNORMAL LOW (ref 150–400)
RBC: 3.95 MIL/uL — ABNORMAL LOW (ref 4.22–5.81)
RDW: 13.2 % (ref 11.5–15.5)
WBC: 14.9 10*3/uL — ABNORMAL HIGH (ref 4.0–10.5)
nRBC: 0 % (ref 0.0–0.2)

## 2019-10-29 LAB — COMPREHENSIVE METABOLIC PANEL
ALT: 1133 U/L — ABNORMAL HIGH (ref 0–44)
AST: 1796 U/L — ABNORMAL HIGH (ref 15–41)
Albumin: 2.6 g/dL — ABNORMAL LOW (ref 3.5–5.0)
Alkaline Phosphatase: 94 U/L (ref 38–126)
Anion gap: 19 — ABNORMAL HIGH (ref 5–15)
BUN: 63 mg/dL — ABNORMAL HIGH (ref 6–20)
CO2: 16 mmol/L — ABNORMAL LOW (ref 22–32)
Calcium: 5.8 mg/dL — CL (ref 8.9–10.3)
Chloride: 97 mmol/L — ABNORMAL LOW (ref 98–111)
Creatinine, Ser: 8.42 mg/dL — ABNORMAL HIGH (ref 0.61–1.24)
GFR calc Af Amer: 8 mL/min — ABNORMAL LOW (ref >60)
GFR calc non Af Amer: 7 mL/min — ABNORMAL LOW (ref >60)
Glucose, Bld: 114 mg/dL — ABNORMAL HIGH (ref 70–99)
Potassium: 4.6 mmol/L (ref 3.5–5.1)
Sodium: 132 mmol/L — ABNORMAL LOW (ref 135–145)
Total Bilirubin: 1.3 mg/dL — ABNORMAL HIGH (ref 0.3–1.2)
Total Protein: 5 g/dL — ABNORMAL LOW (ref 6.5–8.1)

## 2019-10-29 LAB — RENAL FUNCTION PANEL
Albumin: 2.5 g/dL — ABNORMAL LOW (ref 3.5–5.0)
Anion gap: 19 — ABNORMAL HIGH (ref 5–15)
BUN: 80 mg/dL — ABNORMAL HIGH (ref 6–20)
CO2: 15 mmol/L — ABNORMAL LOW (ref 22–32)
Calcium: 5.8 mg/dL — CL (ref 8.9–10.3)
Chloride: 98 mmol/L (ref 98–111)
Creatinine, Ser: 9.7 mg/dL — ABNORMAL HIGH (ref 0.61–1.24)
GFR calc Af Amer: 7 mL/min — ABNORMAL LOW (ref >60)
GFR calc non Af Amer: 6 mL/min — ABNORMAL LOW (ref >60)
Glucose, Bld: 110 mg/dL — ABNORMAL HIGH (ref 70–99)
Phosphorus: 11.2 mg/dL — ABNORMAL HIGH (ref 2.5–4.6)
Potassium: 4.4 mmol/L (ref 3.5–5.1)
Sodium: 132 mmol/L — ABNORMAL LOW (ref 135–145)

## 2019-10-29 LAB — CK: Total CK: 22988 U/L — ABNORMAL HIGH (ref 49–397)

## 2019-10-29 LAB — LDL CHOLESTEROL, DIRECT
Direct LDL: 66 mg/dL (ref 0–99)
Direct LDL: 56.7 mg/dL (ref 0–99)

## 2019-10-29 LAB — ECHOCARDIOGRAM COMPLETE
Area-P 1/2: 5.23 cm2
Height: 71 in
S' Lateral: 3.8 cm
Weight: 3682.56 oz

## 2019-10-29 LAB — MAGNESIUM: Magnesium: 2.2 mg/dL (ref 1.7–2.4)

## 2019-10-29 LAB — MRSA PCR SCREENING: MRSA by PCR: NEGATIVE

## 2019-10-29 MED ORDER — PENTAFLUOROPROP-TETRAFLUOROETH EX AERO: 1.0000 "application " | INHALATION_SPRAY | CUTANEOUS | Status: DC | PRN

## 2019-10-29 MED ORDER — LACTATED RINGERS IV SOLN: INTRAVENOUS | Status: DC

## 2019-10-29 MED ORDER — HEPARIN SODIUM (PORCINE) 1000 UNIT/ML DIALYSIS
1000.0000 [IU] | INTRAMUSCULAR | Status: DC | PRN
  Administered 2019-11-05 – 2019-11-07 (×2): 1000 [IU] via INTRAVENOUS_CENTRAL
  Filled 2019-10-29 (×7): qty 1

## 2019-10-29 MED ORDER — LIDOCAINE HCL (PF) 1 % IJ SOLN: 5.0000 mL | INTRAMUSCULAR | Status: DC | PRN

## 2019-10-29 MED ORDER — SODIUM CHLORIDE 0.9 % IV SOLN: 100.0000 mL | INTRAVENOUS | Status: DC | PRN

## 2019-10-29 MED ORDER — ALTEPLASE 2 MG IJ SOLR: 2.0000 mg | Freq: Once | INTRAMUSCULAR | Status: DC | PRN

## 2019-10-29 MED ORDER — LIDOCAINE-PRILOCAINE 2.5-2.5 % EX CREA
1.0000 "application " | TOPICAL_CREAM | CUTANEOUS | Status: DC | PRN
  Filled 2019-10-29: qty 5

## 2019-10-29 MED ORDER — CHLORHEXIDINE GLUCONATE CLOTH 2 % EX PADS
6.0000 | MEDICATED_PAD | Freq: Every day | CUTANEOUS | Status: DC
  Administered 2019-10-30 – 2019-11-03 (×6): 6 via TOPICAL

## 2019-10-29 MED ORDER — CALCIUM GLUCONATE-NACL 2-0.675 GM/100ML-% IV SOLN
2.0000 g | Freq: Once | INTRAVENOUS | Status: AC
  Administered 2019-10-29: 2000 mg via INTRAVENOUS
  Filled 2019-10-29: qty 100

## 2019-10-29 NOTE — Progress Notes (Signed)
[facility] Progress Note  Subjective: Cr up again, still has UOP, but in the mid-8s now in about 24 hrs  Vitals:   10/28/19 2129 10/28/19 2358 10/29/19 0346 10/29/19 0754  BP:  125/71 118/78 128/76  Pulse:  (!) 117 (!) 113 (!) 114  Resp:  18 20 20   Temp:  98.7 F (37.1 C) 98 F (36.7 C) 98.8 F (37.1 C)  TempSrc:  Oral Oral Oral  SpO2: 96% 98% 94% 95%  Weight:   104.4 kg   Height:        Exam: Gen NAD No jvd or bruits Chest clear bilat to bases RRR no MRG Abd soft ntnd no mass or ascites R leg and arm w/ tense edema, no edema on L side, R cheek abrasion Ext no wounds or ulcers Neuro is alert, Ox 3 , nf    Home meds:  - remicade 400 IV every 8 wks  - ciagra 50 prn/ claritin 10 prn  - prn's/ vitamins/ supplements     UDS + cocaine     CT abd 9/04 > Adrenals/Urinary Tract: Both adrenal glands appear normal. The kidneys and collecting system appear normal without evidence of urinary tract calculus or hydronephrosis. Bladder is unremarkable. Otherwise >> IMPRESSION: small portion of transverse colon and fat containing anterior umbilical hernia, however no definite evidence bowel obstruction or strangulation. Moderate amount of colonic stool. Patchy streaky airspace opacity at the right lung base which may be due to atelectasis and/or infectious etiology.     MRI brain 9/04 > IMPRESSION: 1. Symmetric restricted diffusion in the globi pallidi. This is most often seen with an acute toxic or metabolic insult (including carbon monoxide poisoning and drugs of abuse) or hypoxic-ischemic injury. No hemorrhage. 2. Punctate acute infarcts in the R occipital lobe and R cerebellum.    CXR 10/27/19 - IMPRESSION: Low lung volumes with bibasilar atelectasis. No evidence of acute traumatic injury         Assessment/ Plan: 1. AKI - suspect rhabdomyolysis w/ swollen R leg/ arm, AKI and high K+ in pt who was unconscious on the ground for too long of a time. CPK > 50K.  AKI due  to hypotension/ hypovolemia and rhabdomyolysis. Had 4L bolus in ED on 9/3. Cr up to mid 8s, K better, but will need RRT.  Have d/w pt and wife.  Will make NPO for [facility], HD tomorrow after line placement. 2. Hyperkalemia - K+ 7's on admit > 5.0 today. Better. Still at risk, will cont lokelma at lowered dose of 10 bid, dc when K+ < 4.5 3. Drug abuse 4. H/o Crohn's - hx colostomy 2011, reversal in 2012       Madelon Lips 10/29/2019, 11:19 AM   Recent Labs  Lab 10/28/19 0137 10/28/19 0500 10/28/19 0649 10/28/19 0649 10/28/19 1635 10/29/19 0314  K   < >  --  5.0   < > 4.6 4.6  BUN   < >  --  46*   < > 52* 63*  CREATININE   < >  --  6.06*   < > 7.26* 8.42*  CALCIUM   < >  --  5.9*   < > 5.9* 5.8*  PHOS  --   --  10.1*  --   --   --   HGB  --  13.9  --   --   --  13.1   < > = values in this interval not displayed.   Inpatient medications: .  aspirin EC  81 mg Oral Daily  . Chlorhexidine Gluconate Cloth  6 each Topical Daily  . heparin  5,000 Units Subcutaneous Q8H  . sodium zirconium cyclosilicate  10 g Oral BID    HYDROmorphone (DILAUDID) injection, ondansetron **OR** ondansetron (ZOFRAN) IV, senna-docusate

## 2019-10-29 NOTE — Progress Notes (Signed)
°   10/28/19 1322  Assess: MEWS Score  Temp (!) 97.3 F (36.3 C)  BP (!) 141/81  Pulse Rate (!) 113  ECG Heart Rate (!) 113  Resp 18  SpO2 94 %  O2 Device Room Air  Assess: MEWS Score  MEWS Temp 0  MEWS Systolic 0  MEWS Pulse 2  MEWS RR 0  MEWS LOC 0  MEWS Score 2  MEWS Score Color Yellow  Assess: if the MEWS score is Yellow or Red  Were vital signs taken at a resting state? Yes  Focused Assessment No change from prior assessment  Early Detection of Sepsis Score *See Row Information* Low  MEWS guidelines implemented *See Row Information* Yes  Treat  MEWS Interventions Escalated (See documentation below)  Pain Scale 0-10  Pain Score 0  Take Vital Signs  Increase Vital Sign Frequency  Yellow: Q 2hr X 2 then Q 4hr X 2, if remains yellow, continue Q 4hrs  Escalate  MEWS: Escalate Yellow: discuss with charge nurse/RN and consider discussing with provider and RRT  Notify: Charge Nurse/RN  Name of Charge Nurse/RN Notified [name]  Date Charge Nurse/RN Notified 10/28/19  Time Charge Nurse/RN Notified 1330  Document  Patient Outcome Not stable and remains on department  Progress note created (see row info) Yes

## 2019-10-29 NOTE — Progress Notes (Signed)
NEURO HOSPITALIST PROGRESS NOTE    Brief Narrative:                                                                                                                                          [name] is an 49 y.o. male with a past medical history significant for Chron's disease controlled on Remicade who presented to the emergency department on 04Sep2021 following a period of time down after a night of drinking and drug use (?ecstasy/cocaine). Upon waking, [name] reported significant pain and difficulty moving his right arm and leg (leg >> arm).  On my visit today [name]'s right arm strength and pain has improved, although pain is still present. Although strength in his right arm has improved, sensation in his right hand is still diminished and it feels "like it has a glove on". [name]'s right leg is still very weak but he has some ability to flex his hip.   [name] is currently being treated by nephrology for rhabdomyolysis.  Wife present at bedside  Pertinent Medications Dilaudid .50m PRN pain  Pertinent Imaging/Diagnostics 04Sep2021 CT head: No acute abnormality 04Sep2021 MR brain: Symmetric restricted diffusion in the globi pallidi, Punctate acute infarcts in the right occipital lobe and right cerebellum 06Sep2021 MR C-Spine, MR L-Spine: Mild C3-4 canal stenosis 2/2 small central disc protrusion; Mild right and moderate left C4-5 foraminal stenosis. Mild facet arthrosis L4-5, L5-S1 06Sep2021 MRA head/neck: Normal   Past Medical History:  Diagnosis Date  . Allergy   . Crohn's colitis ([facility]   . Depression   . Pulmonary embolism ([facility]     Past Surgical History:  Procedure Laterality Date  . COLOSTOMY  March 2011  . Colostomy reversal  January 2012  . HIP SURGERY  R hip surgery     Family History  Problem Relation Age of Onset  . Colon polyps Father   . Multiple sclerosis Sister   . Colon cancer Neg Hx   . Esophageal cancer Neg Hx   .  Stomach cancer Neg Hx   . Pancreatic cancer Neg Hx   . Liver disease Neg Hx     Social History:  reports that he has been smoking. He has been smoking about 0.50 packs per day. He has never used smokeless tobacco. He reports current alcohol use. He reports that he does not use drugs.  No Known Allergies  MEDICATIONS:  Current Meds  Medication Sig  . acetaminophen (TYLENOL) 500 MG tablet Take 1,500 mg by mouth 2 (two) times daily as needed for mild pain or moderate pain.   Marland Kitchen inFLIXimab (REMICADE) 100 MG injection Inject 400 mg into the vein every 8 (eight) weeks.     Review Of Systems:                                                                                                           A 14-point review of systems was performed and is negative except as noted in the HPI.  Blood pressure 121/67, pulse (!) 109, temperature 99.4 F (37.4 C), temperature source Oral, resp. rate 20, height 5' 11"  (1.803 m), weight 104.4 kg, SpO2 96 %.   Physical Examination:                                                                                                      General: WDWN male. Appears calm and comfortable in bed.  HEENT:  Normocephalic. Bruising and redness on the right cheek. Normal external eyes, ears and nose. Mucous membranes dry. Cardiovascular: regular rate and rhythm, pulses palpable throughout   Pulmonary: Breathing comfortably on room air Abdomen: Soft, non-tender Extremities: Right arm and leg slightly swollen compared to left. Musculoskeletal: Tone and bulk normal; no atrophy noted Skin: warm and dry, no hyperpigmentation, vitiligo, or suspicious lesions other than as noted in HEENT  Neurological Examination:                                                                                               Mental Status: [name] is alert, oriented x 4, thought  content appropriate.  Speech fluent  without evidence of aphasia. Able to follow 3-step commands without difficulty. Cranial Nerves: II: Visual fields grossly normal, pupils are equal, round, reactive to light. III,IV, VI: Ptosis not present, extra-ocular muscle movements intact bilaterally V,VII: Smile and eyebrow raise is symmetric. Facial light touch and pinprick sensation intact bilaterally VIII: Hearing grossly intact IX,X: Uvula and palate rise symmetrically XI: SCM and bilateral shoulder shrug strength 5/5 on left, 4/5 on right XII: Midline tongue extension Motor: Right :     Upper extremity   4/5, but 3-/5 wrist extension  Left:     Upper extremity   5/5          Lower extremity   Hip flexion antigravity only   Lower extremity   5/5 Pronator drift not present Sensory: Pinprick and light touch intact throughout left side and right arm. Diminished in right hand and right lower limb except above the knee and along S4 distribution of the leg. Right foot is mostly asensate. Deep Tendon Reflexes: Dropped throughout Cerebellar: Finger-to-nose test without evidence of dysmetria. Some slight ataxia noted.  Gait: Not tested.   Lab Results: Basic Metabolic Panel: Recent Labs  Lab 10/27/19 1852 10/27/19 1852 10/27/19 1953 10/27/19 2144 10/28/19 0137 10/28/19 0137 10/28/19 0649 10/28/19 1635 10/29/19 0314  NA 147*  --    < > 140 139  --  137 135 132*  K >7.5*  --    < > 7.5* 7.0*  --  5.0 4.6 4.6  CL 109  --    < > 114* 110  --  106 100 97*  CO2 14*  --   --   --  13*  --  15* 16* 16*  GLUCOSE 96  --    < > 102* 137*  --  143* 133* 114*  BUN 31*  --    < > 38* 37*  --  46* 52* 63*  CREATININE 5.26*  --    < > 5.50* 5.46*  --  6.06* 7.26* 8.42*  CALCIUM 6.9*   < >  --   --  5.8*   < > 5.9* 5.9* 5.8*  MG  --   --   --   --   --   --   --   --  2.2  PHOS  --   --   --   --   --   --  10.1*  --   --    < > = values in this interval not displayed.    Liver Function Tests: Recent  Labs  Lab 10/27/19 1852 10/28/19 0500 10/28/19 0649 10/29/19 0314  AST 4,337* 9,139*  --  1,796*  ALT 1,851* 3,179*  --  1,133*  ALKPHOS 83 72  --  94  BILITOT 0.7 0.9  --  1.3*  PROT 7.1 5.5*  --  5.0*  ALBUMIN 3.9 3.0* 3.1* 2.6*   No results for input(s): LIPASE, AMYLASE in the last 168 hours. No results for input(s): AMMONIA in the last 168 hours.  CBC: Recent Labs  Lab 10/27/19 1705 10/27/19 2144 10/28/19 0500 10/29/19 0314  WBC 43.5*  --  25.2* 18.5*  NEUTROABS 37.4*  --   --  16.5*  HGB 19.3* 13.6 13.9 13.1  HCT 56.4* 40.0 42.0 38.6*  MCV 91.6  --  94.6 91.3  PLT 276  --  148* 137*    Cardiac Enzymes: Recent Labs  Lab 10/27/19 1852 10/28/19 0137 10/29/19 0314  CKTOTAL >50,000* >50,000* 22,988*    Lipid Panel: Recent Labs  Lab 10/28/19 0649  CHOL 182  TRIG 462*  HDL 20*  CHOLHDL 9.1  VLDL UNABLE TO CALCULATE IF TRIGLYCERIDE OVER 400 mg/dL  LDLCALC UNABLE TO CALCULATE IF TRIGLYCERIDE OVER 400 mg/dL    CBG: Recent Labs  Lab 10/27/19 1600 10/27/19 2259  GLUCAP 97 174*    Microbiology: Results for orders placed or performed during the hospital encounter of 10/27/19  Culture, blood (Routine X 2) w Reflex to ID Panel     Status: None (Preliminary result)  Collection Time: 10/27/19  8:43 PM   Specimen: BLOOD  Result Value Ref Range Status   Specimen Description   Final    BLOOD LEFT ANTECUBITAL Performed at [facility] [address] [place], [place] [address]    Special Requests   Final    BOTTLES DRAWN AEROBIC AND ANAEROBIC Blood Culture results may not be optimal due to an excessive volume of blood received in culture bottles Performed at [facility] [address], [place], [place] [address]    Culture   Final    NO GROWTH 1 DAY Performed at [facility], [place] [address], [place], [place] [address]    Report Status PENDING  Incomplete  SARS Coronavirus 2 by RT PCR (hospital order,  performed in [facility] hospital lab) Nasopharyngeal Nasopharyngeal Swab     Status: None   Collection Time: 10/27/19  8:43 PM   Specimen: Nasopharyngeal Swab  Result Value Ref Range Status   SARS Coronavirus 2 NEGATIVE NEGATIVE Final    Comment: (NOTE) SARS-CoV-2 target nucleic acids are NOT DETECTED.  The SARS-CoV-2 RNA is generally detectable in upper and lower respiratory specimens during the acute phase of infection. The lowest concentration of SARS-CoV-2 viral copies this assay can detect is 250 copies / mL. A negative result does not preclude SARS-CoV-2 infection and should not be used as the sole basis for treatment or other patient management decisions.  A negative result may occur with improper specimen collection / handling, submission of specimen other than nasopharyngeal swab, presence of viral mutation(s) within the areas targeted by this assay, and inadequate number of viral copies (<250 copies / mL). A negative result must be combined with clinical observations, patient history, and epidemiological information.  Fact Sheet for Patients:   StrictlyIdeas.no  Fact Sheet for Healthcare Providers: BankingDealers.co.za  This test is not yet approved or  cleared by the [place] FDA and has been authorized for detection and/or diagnosis of SARS-CoV-2 by FDA under an Emergency Use Authorization (EUA).  This EUA will remain in effect (meaning this test can be used) for the duration of the COVID-19 declaration under Section 564(b)(1) of the Act, 21 U.S.C. section 360bbb-3(b)(1), unless the authorization is terminated or revoked sooner.  Performed at [facility], [place] [address] [place], [place] [address]   Culture, blood (Routine X 2) w Reflex to ID Panel     Status: None (Preliminary result)   Collection Time: 10/27/19  9:12 PM   Specimen: BLOOD  Result Value Ref Range Status   Specimen Description    Final    BLOOD RIGHT ANTECUBITAL Performed at [facility] [address], [place], [place] [address]    Special Requests   Final    BOTTLES DRAWN AEROBIC AND ANAEROBIC Blood Culture adequate volume Performed at [facility] [address] [place], [place] [address]    Culture   Final    NO GROWTH 1 DAY Performed at [facility], [place] [address], [place],  [address]    Report Status PENDING  Incomplete    Coagulation Studies: No results for input(s): LABPROT, INR in the last 72 hours.  Imaging: CT Abdomen Pelvis Wo Contrast  Result Date: 10/27/2019 CLINICAL DATA:  Abdominal distension and pain EXAM: CT ABDOMEN AND PELVIS WITHOUT CONTRAST TECHNIQUE: Multidetector CT imaging of the abdomen and pelvis was performed following the standard protocol without IV contrast. COMPARISON:  None. FINDINGS: Lower chest: The  visualized heart size within normal limits. No pericardial fluid/thickening. No hiatal hernia. Mild streaky patchy airspace opacity seen at the right lung base. Hepatobiliary: Although limited due to the lack of intravenous contrast, normal in appearance without gross focal abnormality. No evidence of calcified gallstones or biliary ductal dilatation. Pancreas:  Unremarkable.  No surrounding inflammatory changes. Spleen: Normal in size. Although limited due to the lack of intravenous contrast, normal in appearance. Adrenals/Urinary Tract: Both adrenal glands appear normal. The kidneys and collecting system appear normal without evidence of urinary tract calculus or hydronephrosis. Bladder is unremarkable. Stomach/Bowel: The stomach, small bowel, are normal in appearance. There is a small anterior fat and tiny portion of transverse colon containing hernia seen along the anterior abdominal wall. A surgical anastomosis seen within the left distal colon. No areas of focal wall thickening or bowel dilatation are seen. There is a moderate  amount of colonic stool present. Vascular/Lymphatic: There are no enlarged abdominal or pelvic lymph nodes. Scattered mild aortic atherosclerosis is seen. Reproductive: The prostate is unremarkable. Other: No evidence of abdominal wall mass or hernia. Musculoskeletal: No acute or significant osseous findings. Heterotopic ossifications are seen around the right hip. IMPRESSION: Small portion of transverse colon and fat containing anterior umbilical hernia, however no definite evidence bowel obstruction or strangulation. Moderate amount of colonic stool. Patchy streaky airspace opacity at the right lung base which may be due to atelectasis and/or infectious etiology. Aortic Atherosclerosis (ICD10-I70.0). Electronically Signed   By: Prudencio Pair M.D.   On: 10/27/2019 20:50   DG Ankle 2 Views Right  Result Date: 10/27/2019 CLINICAL DATA:  Trauma, fall.  Right ankle pain. EXAM: RIGHT ANKLE - 2 VIEW COMPARISON:  None. FINDINGS: No acute fracture or dislocation. There is slight lateral talar tilt. No mortise widening. No ankle joint effusion. No focal soft tissue abnormality. IMPRESSION: No acute fracture or dislocation. Slight lateral talar tilt. Electronically Signed   By: Keith Rake M.D.   On: 10/27/2019 16:59   CT Head Wo Contrast  Result Date: 10/27/2019 CLINICAL DATA:  Post injury, syncopal episode and fall. EXAM: CT HEAD WITHOUT CONTRAST CT MAXILLOFACIAL WITHOUT CONTRAST CT CERVICAL SPINE WITHOUT CONTRAST TECHNIQUE: Multidetector CT imaging of the head, cervical spine, and maxillofacial structures were performed using the standard protocol without intravenous contrast. Multiplanar CT image reconstructions of the cervical spine and maxillofacial structures were also generated. COMPARISON:  None. FINDINGS: CT HEAD FINDINGS Brain: No evidence of acute infarction, hemorrhage, hydrocephalus, extra-axial collection or mass lesion/mass effect. Vascular: No hyperdense vessel or unexpected calcification. Skull:  Normal. Negative for fracture or focal lesion. Other: None. CT MAXILLOFACIAL FINDINGS Osseous: No fracture or mandibular dislocation. No destructive process. Orbits: Negative. No traumatic or inflammatory finding. Sinuses: Clear. Soft tissues: Negative. CT CERVICAL SPINE FINDINGS Alignment: Normal. Skull base and vertebrae: No acute fracture. No primary bone lesion or focal pathologic process. Soft tissues and spinal canal: No prevertebral fluid or swelling. No visible canal hematoma. Disc levels:  Mild osteoarthritic changes at C2-C3, C3-C4 and C4-C5. Upper chest: Negative. Other: None. IMPRESSION: 1. No acute intracranial abnormality. 2. No evidence of acute traumatic injury to the cervical spine. 3. No evidence of facial fractures. 4. Mild osteoarthritic changes of the cervical spine. Electronically Signed   By: Fidela Salisbury M.D.   On: 10/27/2019 16:51   CT Cervical Spine Wo Contrast  Result Date: 10/27/2019 CLINICAL DATA:  Post injury, syncopal episode and fall. EXAM: CT HEAD WITHOUT CONTRAST CT MAXILLOFACIAL WITHOUT CONTRAST CT CERVICAL SPINE WITHOUT CONTRAST  TECHNIQUE: Multidetector CT imaging of the head, cervical spine, and maxillofacial structures were performed using the standard protocol without intravenous contrast. Multiplanar CT image reconstructions of the cervical spine and maxillofacial structures were also generated. COMPARISON:  None. FINDINGS: CT HEAD FINDINGS Brain: No evidence of acute infarction, hemorrhage, hydrocephalus, extra-axial collection or mass lesion/mass effect. Vascular: No hyperdense vessel or unexpected calcification. Skull: Normal. Negative for fracture or focal lesion. Other: None. CT MAXILLOFACIAL FINDINGS Osseous: No fracture or mandibular dislocation. No destructive process. Orbits: Negative. No traumatic or inflammatory finding. Sinuses: Clear. Soft tissues: Negative. CT CERVICAL SPINE FINDINGS Alignment: Normal. Skull base and vertebrae: No acute fracture. No  primary bone lesion or focal pathologic process. Soft tissues and spinal canal: No prevertebral fluid or swelling. No visible canal hematoma. Disc levels:  Mild osteoarthritic changes at C2-C3, C3-C4 and C4-C5. Upper chest: Negative. Other: None. IMPRESSION: 1. No acute intracranial abnormality. 2. No evidence of acute traumatic injury to the cervical spine. 3. No evidence of facial fractures. 4. Mild osteoarthritic changes of the cervical spine. Electronically Signed   By: Fidela Salisbury M.D.   On: 10/27/2019 16:51   MR ANGIO HEAD WO CONTRAST  Result Date: 10/29/2019 CLINICAL DATA:  Left-sided weakness EXAM: MRA HEAD WITHOUT CONTRAST MRA NECK WITHOUT CONTRAST TECHNIQUE: Angiographic images of the Circle of Willis were obtained using MRA technique without intravenous contrast. Angiographic images of the neck were obtained using MRA technique without intravenous contrast. Carotid stenosis measurements (when applicable) are obtained utilizing NASCET criteria, using the distal internal carotid diameter as the denominator. COMPARISON:  None. FINDINGS: MRA HEAD FINDINGS POSTERIOR CIRCULATION: --Vertebral arteries: Normal V4 segments. --Inferior cerebellar arteries: Normal. --Basilar artery: Normal. --Superior cerebellar arteries: Normal. --Posterior cerebral arteries: Normal. The left PCA is predominantly supplied by the posterior communicating artery. ANTERIOR CIRCULATION: --Intracranial internal carotid arteries: Normal. --Anterior cerebral arteries (ACA): Normal. Both A1 segments are present. Patent anterior communicating artery (a-comm). --Middle cerebral arteries (MCA): Normal. MRA NECK FINDINGS Normal vertebral and carotid arteries. IMPRESSION: Normal MRA of the head and neck. Electronically Signed   By: Ulyses Jarred M.D.   On: 10/29/2019 00:25   MR ANGIO NECK WO CONTRAST  Result Date: 10/29/2019 CLINICAL DATA:  Left-sided weakness EXAM: MRA HEAD WITHOUT CONTRAST MRA NECK WITHOUT CONTRAST TECHNIQUE:  Angiographic images of the Circle of Willis were obtained using MRA technique without intravenous contrast. Angiographic images of the neck were obtained using MRA technique without intravenous contrast. Carotid stenosis measurements (when applicable) are obtained utilizing NASCET criteria, using the distal internal carotid diameter as the denominator. COMPARISON:  None. FINDINGS: MRA HEAD FINDINGS POSTERIOR CIRCULATION: --Vertebral arteries: Normal V4 segments. --Inferior cerebellar arteries: Normal. --Basilar artery: Normal. --Superior cerebellar arteries: Normal. --Posterior cerebral arteries: Normal. The left PCA is predominantly supplied by the posterior communicating artery. ANTERIOR CIRCULATION: --Intracranial internal carotid arteries: Normal. --Anterior cerebral arteries (ACA): Normal. Both A1 segments are present. Patent anterior communicating artery (a-comm). --Middle cerebral arteries (MCA): Normal. MRA NECK FINDINGS Normal vertebral and carotid arteries. IMPRESSION: Normal MRA of the head and neck. Electronically Signed   By: Ulyses Jarred M.D.   On: 10/29/2019 00:25   MR Brain Wo Contrast (neuro protocol)  Result Date: 10/27/2019 CLINICAL DATA:  Syncope.  Right-sided weakness. EXAM: MRI HEAD WITHOUT CONTRAST TECHNIQUE: Multiplanar, multiecho pulse sequences of the brain and surrounding structures were obtained without intravenous contrast. COMPARISON:  Head CT 10/27/2019 FINDINGS: Brain: There is symmetric restricted diffusion and T2 hyperintensity in the globi pallidi. There are also 2 subcentimeter foci  of restricted diffusion in the right occipital lobe and a single punctate focus of restricted diffusion in the right cerebellum. The brain is normal in signal elsewhere. No intracranial hemorrhage, mass, midline shift, or extra-axial fluid collection is identified. The ventricles and sulci are normal. Vascular: Major intracranial vascular flow voids are preserved. Skull and upper cervical spine:  Unremarkable bone marrow signal. Sinuses/Orbits: Unremarkable orbits. Paranasal sinuses and mastoid air cells are clear. Other: None. IMPRESSION: 1. Symmetric restricted diffusion in the globi pallidi. This is most often seen with an acute toxic or metabolic insult (including carbon monoxide poisoning and drugs of abuse) or hypoxic-ischemic injury. No hemorrhage. 2. Punctate acute infarcts in the right occipital lobe and right cerebellum. Electronically Signed   By: Logan Bores M.D.   On: 10/27/2019 19:01   MR CERVICAL SPINE WO CONTRAST  Result Date: 10/29/2019 CLINICAL DATA:  Right upper and lower extremity weakness EXAM: MRI CERVICAL SPINE WITHOUT CONTRAST TECHNIQUE: Multiplanar, multisequence MR imaging of the cervical spine was performed. No intravenous contrast was administered. COMPARISON:  None. FINDINGS: Alignment: Reversal of normal cervical lordosis may be positional or due to muscle spasm. Vertebrae: No fracture, evidence of discitis, or bone lesion. Cord: Normal signal and morphology. Posterior Fossa, vertebral arteries, paraspinal tissues: Visualized posterior fossa is normal. Vertebral artery flow voids are preserved. No prevertebral soft tissue swelling. Disc levels: C1-2: Small central disc protrusion. C2-3: Normal disc space and facet joints. There is no spinal canal stenosis. No neural foraminal stenosis. C3-4: Small central disc protrusion with minimal superior migration . Mild spinal canal stenosis. Mild left neural foraminal stenosis. C4-5: Small disc bulge with bilateral facet hypertrophy and left greater than right uncovertebral spurring. There is no spinal canal stenosis. Mild right, moderate left neural foraminal stenosis. C5-6: Normal disc space and facet joints. There is no spinal canal stenosis. No neural foraminal stenosis. C6-7: Normal disc space and facet joints. There is no spinal canal stenosis. No neural foraminal stenosis. C7-T1: Normal disc space and facet joints. There is no  spinal canal stenosis. No neural foraminal stenosis. IMPRESSION: 1. Mild spinal canal stenosis at C3-4 secondary to small central disc protrusion with minimal superior migration. 2. Mild right and moderate left C4-5 neural foraminal stenosis. 3. Mild left C3-4 neural foraminal stenosis. Electronically Signed   By: Ulyses Jarred M.D.   On: 10/29/2019 00:09   MR LUMBAR SPINE WO CONTRAST  Result Date: 10/29/2019 CLINICAL DATA:  Left lower extremity weakness EXAM: MRI LUMBAR SPINE WITHOUT CONTRAST TECHNIQUE: Multiplanar, multisequence MR imaging of the lumbar spine was performed. No intravenous contrast was administered. COMPARISON:  None. FINDINGS: Segmentation:  Standard. Alignment:  Physiologic. Vertebrae:  No fracture, evidence of discitis, or bone lesion. Conus medullaris and cauda equina: Conus extends to the L1 level. Conus and cauda equina appear normal. Paraspinal and other soft tissues: Negative. Disc levels: There is no disc herniation, spinal canal stenosis or neural foraminal stenosis. There is mild facet arthrosis at L4-5 and L5-S1. IMPRESSION: 1. No disc herniation, spinal canal stenosis or neural foraminal stenosis. 2. Mild facet arthrosis at L4-5 and L5-S1. Electronically Signed   By: Ulyses Jarred M.D.   On: 10/29/2019 00:16   US RENAL  Result Date: 10/28/2019 CLINICAL DATA:  Acute kidney injury EXAM: RENAL / URINARY TRACT ULTRASOUND COMPLETE COMPARISON:  CT abdomen/pelvis dated 10/27/2019 FINDINGS: Right Kidney: Renal measurements: 11.5 x 6.3 x 6.8 cm = volume: 257 mL. Echogenicity within normal limits. No mass or hydronephrosis visualized. Left Kidney: Renal measurements: 10.0  x 7.0 x 6.1 cm = volume: 223 mL. Echogenicity within normal limits. No mass or hydronephrosis visualized. Bladder: Appears normal for degree of bladder distention. Other: None. IMPRESSION: Negative renal ultrasound.  No hydronephrosis. Electronically Signed   By: Julian Hy M.D.   On: 10/28/2019 08:40   DG Chest  Port 1 View  Result Date: 10/27/2019 CLINICAL DATA:  Trauma.  Fall onto right side. EXAM: PORTABLE CHEST 1 VIEW COMPARISON:  Chest radiograph 03/06/2010, no interval imaging available. FINDINGS: Lung volumes are low. Mild elevation of right hemidiaphragm. Upper normal heart size likely accentuated by technique. No evidence of pneumothorax. Streaky opacities at the right greater than left lung base typical of atelectasis. No acute osseous abnormalities are seen. Right anterior fourth rib fracture appears remote. IMPRESSION: Low lung volumes with bibasilar atelectasis. No evidence of acute traumatic injury. Electronically Signed   By: Keith Rake M.D.   On: 10/27/2019 16:55   DG HIP UNILAT WITH PELVIS 2-3 VIEWS RIGHT  Result Date: 10/27/2019 CLINICAL DATA:  Fall onto right side.  History of right hip surgery. EXAM: DG HIP (WITH OR WITHOUT PELVIS) 2-3V RIGHT COMPARISON:  Remote radiograph 06/28/2009 FINDINGS: No evidence of acute fracture. There is heterotopic ossification about the right hip. This appears increased from prior exam. Femoral head is seated in the acetabulum. Pubic rami are intact. Pubic symphysis and sacroiliac joints are congruent. There is slight flattening of the left femoral head neck junction that is unchanged from prior. IMPRESSION: No acute fracture or subluxation of the pelvis or right hip. Electronically Signed   By: Keith Rake M.D.   On: 10/27/2019 16:57   CT Maxillofacial WO CM  Result Date: 10/27/2019 CLINICAL DATA:  Post injury, syncopal episode and fall. EXAM: CT HEAD WITHOUT CONTRAST CT MAXILLOFACIAL WITHOUT CONTRAST CT CERVICAL SPINE WITHOUT CONTRAST TECHNIQUE: Multidetector CT imaging of the head, cervical spine, and maxillofacial structures were performed using the standard protocol without intravenous contrast. Multiplanar CT image reconstructions of the cervical spine and maxillofacial structures were also generated. COMPARISON:  None. FINDINGS: CT HEAD FINDINGS  Brain: No evidence of acute infarction, hemorrhage, hydrocephalus, extra-axial collection or mass lesion/mass effect. Vascular: No hyperdense vessel or unexpected calcification. Skull: Normal. Negative for fracture or focal lesion. Other: None. CT MAXILLOFACIAL FINDINGS Osseous: No fracture or mandibular dislocation. No destructive process. Orbits: Negative. No traumatic or inflammatory finding. Sinuses: Clear. Soft tissues: Negative. CT CERVICAL SPINE FINDINGS Alignment: Normal. Skull base and vertebrae: No acute fracture. No primary bone lesion or focal pathologic process. Soft tissues and spinal canal: No prevertebral fluid or swelling. No visible canal hematoma. Disc levels:  Mild osteoarthritic changes at C2-C3, C3-C4 and C4-C5. Upper chest: Negative. Other: None. IMPRESSION: 1. No acute intracranial abnormality. 2. No evidence of acute traumatic injury to the cervical spine. 3. No evidence of facial fractures. 4. Mild osteoarthritic changes of the cervical spine. Electronically Signed   By: Fidela Salisbury M.D.   On: 10/27/2019 16:51    Assessment and plan per attending neurologist.   Solon Augusta PA-C [facility]  10/29/2019, 12:37 PM  Assessment and Plan:

## 2019-10-29 NOTE — Progress Notes (Signed)
CRITICAL VALUE STICKER  CRITICAL VALUE: Calcium 5.8  RECEIVER (on-site recipient of call): Silverio Hagan, RN  DATE & TIME NOTIFIED: 10/29/2019, 8209  MESSENGER (representative from lab):  Sander Radon   MD NOTIFIED: A. Zierle-Ghosh, DO  TIME OF NOTIFICATION: [phone]  RESPONSE: no response as of yet

## 2019-10-29 NOTE — Evaluation (Signed)
Occupational Therapy Evaluation Patient Details Name: [name] MRN: [id number] DOB: 20-Nov-1970 Today's Date: 10/29/2019    History of Present Illness 49 year old male presents with decreased level of consciousness. Patient states that he took [name] just prior to arrival. States that he was not trying to harm himself. Denies any other coingestions. Does complain of a fall,  woke this morning and had pain to the right side of his face. Also has bilateral lower extremity muscle aches, admitted with ARF and rhabdomyolysis.  PMH: PE, tobacco abuse   Clinical Impression   Patient's HR monitored during session.  HR 111 throughout, up to 121 BPM, and patient cued to sit and rest.  HR returned to 110 sitting EOB times 30 sec.  Patient advised not to hold breath during activity.  Barriers include, RUE and RLE numbness (RLE>RUE) and pain R hip > R shoulder.  Patient notes subjective improvement to numbness R R hand.  Patient was independent with all care, mobility, drove and cares for children at home.  Significant declines to primarily LB ADL and mobility status.  RW utilized.  OT to continue to follow in acute 2x/wk.  Unclear if [facility] OT will be needed, recommendations depending on progress in the acute setting.  Patient and spouse are open to [facility] services.      Follow Up Recommendations  Home health OT    Equipment Recommendations  Tub/shower seat           Precautions / Restrictions Precautions Precautions: Fall Precaution Comments: foot drop noted to RLE. Restrictions Weight Bearing Restrictions: No      Mobility Bed Mobility Overal bed mobility: Needs Assistance Bed Mobility: Sit to Supine       Sit to supine: Mod assist   General bed mobility comments: RLE  Transfers Overall transfer level: Needs assistance Equipment used: Rolling walker (2 wheeled) Transfers: Sit to/from [facility] Sit to Stand: Supervision;Min guard;Min assist Stand pivot transfers:  Supervision;Min guard       General transfer comment: cues for technique, hand placement, assist to rise and transition to RW.  Cued to reach back for solid surface.    Balance Overall balance assessment: Needs assistance Sitting-balance support: No upper extremity supported;Feet supported Sitting balance-Leahy Scale: Good     Standing balance support: Bilateral upper extremity supported Standing balance-Leahy Scale: Fair Standing balance comment: reliant on UEs.  occasional assist to advance RLE, especially side steping.                           ADL either performed or assessed with clinical judgement   ADL Overall ADL's : Needs assistance/impaired Eating/Feeding: Set up;Sitting   Grooming: Set up;Sitting   Upper Body Bathing: Supervision/ safety;Set up;Minimal assistance;Sitting   Lower Body Bathing: Supervison/ safety;Set up;Min guard;Moderate assistance;Sit to/from stand Lower Body Bathing Details (indicate cue type and reason): RLE deficits and decreased safety with RW Upper Body Dressing : Supervision/safety;Set up;Moderate assistance;Sitting   Lower Body Dressing: Supervision/safety;Set up;Maximal assistance Lower Body Dressing Details (indicate cue type and reason): R hip pain and dcreased AROM to RLE.  R shoulder soreness and numbness to fingertips.  Unable to reach B feet. Toilet Transfer: Supervision/safety;Set up;Min guard;Minimal assistance;RW           Functional mobility during ADLs: Minimal assistance;Min guard;Set up;Supervision/safety       Vision Baseline Vision/History: No visual deficits Patient Visual Report: No change from baseline Vision Assessment?: No apparent visual deficits  Perception     Praxis      Pertinent Vitals/Pain Pain Assessment: 0-10 Pain Score: 6  Pain Location: R hip and shoulder Pain Descriptors / Indicators: Grimacing;Sore Pain Intervention(s): Monitored during session;Repositioned     Hand Dominance  Right   Extremity/Trunk Assessment Upper Extremity Assessment RUE Deficits / Details: c/o pain with end range elbow flexion and Shoulder flexion.  R UE MMT grossly  3+/5.  RUE WNL's AROM and MMT RUE Sensation: decreased light touch RUE Coordination: decreased fine motor (patient reports better than previous day)   Lower Extremity Assessment Lower Extremity Assessment: Defer to PT evaluation   Cervical / Trunk Assessment Cervical / Trunk Assessment: Normal   Communication Communication Communication: No difficulties   Cognition Arousal/Alertness: Awake/alert Behavior During Therapy: WFL for tasks assessed/performed Overall Cognitive Status: Within Functional Limits for tasks assessed                                 General Comments: no processing, complex thought, or command following deficits noted.   General Comments  WFL's    Exercises     Shoulder Instructions      Home Living Family/patient expects to be discharged to:: Private residence Living Arrangements: Spouse/significant other;Children Available Help at Discharge: Family Type of Home: House Home Access: Stairs to enter [employer] of Steps: 2-3   Home Layout: One level     Bathroom Shower/Tub: Tub/shower unit;Walk-in shower   Bathroom Toilet: Standard Bathroom Accessibility: Yes How Accessible: Accessible via walker            Prior Functioning/Environment Level of Independence: Independent                 OT Problem List: Decreased strength;Decreased range of motion;Decreased activity tolerance;Impaired balance (sitting and/or standing);Decreased knowledge of use of DME or AE;Impaired sensation;Impaired UE functional use;Pain      OT Treatment/Interventions: Self-care/ADL training;Therapeutic exercise;Energy conservation;DME and/or AE instruction;Therapeutic activities;Balance training    OT Goals(Current goals can be found in the care plan section) Acute Rehab OT  Goals Patient Stated Goal: I want to use my R leg better and get back home. OT Goal Formulation: With patient Time For Goal Achievement: 11/16/19 Potential to Achieve Goals: Good  OT Frequency: Min 2X/week   Barriers to D/C:  Progress in acute setting.                          AM-PAC OT "6 Clicks" Daily Activity     Outcome Measure Help from another person eating meals?: None Help from another person taking care of personal grooming?: None Help from another person toileting, which includes using toliet, bedpan, or urinal?: A Little Help from another person bathing (including washing, rinsing, drying)?: A Lot Help from another person to put on and taking off regular upper body clothing?: A Little Help from another person to put on and taking off regular lower body clothing?: A Lot 6 Click Score: 18   End of Session Equipment Utilized During Treatment: Gait belt;Rolling walker  Activity Tolerance: Patient limited by fatigue;Patient limited by pain Patient left: in bed;with call bell/phone within reach;with family/visitor present  OT Visit Diagnosis: Unsteadiness on feet (R26.81);Muscle weakness (generalized) (M62.81);Pain;Other (comment) (numbness to RLE and R fingertips.) Pain - Right/Left: Right Pain - part of body: Shoulder;Leg                Time: 1010-1030  OT Time Calculation (min): 20 min Charges:  OT General Charges $OT Visit: 1 Visit OT Evaluation $OT Eval Moderate Complexity: 1 Mod  10/29/2019  Rich, OTR/L  Acute Rehabilitation Services  Office:  [place] 10/29/2019, 10:54 AM

## 2019-10-29 NOTE — Progress Notes (Deleted)
   10/28/19 1322  Assess: MEWS Score  Temp (!) 97.3 F (36.3 C)  BP (!) 141/81  Pulse Rate (!) 113  ECG Heart Rate (!) 113  Resp 18  SpO2 94 %  O2 Device Room Air  Assess: MEWS Score  MEWS Temp 0  MEWS Systolic 0  MEWS Pulse 2  MEWS RR 0  MEWS LOC 0  MEWS Score 2  MEWS Score Color Yellow  Assess: if the MEWS score is Yellow or Red  Were vital signs taken at a resting state? Yes  Focused Assessment No change from prior assessment  Early Detection of Sepsis Score *See Row Information* Low  MEWS guidelines implemented *See Row Information* Yes  Treat  MEWS Interventions Escalated (See documentation below)  Pain Scale 0-10  Pain Score 0  Take Vital Signs  Increase Vital Sign Frequency  Yellow: Q 2hr X 2 then Q 4hr X 2, if remains yellow, continue Q 4hrs  Escalate  MEWS: Escalate Yellow: discuss with charge nurse/RN and consider discussing with provider and RRT  Notify: Charge Nurse/RN  Name of Charge Nurse/RN Notified Colletta [name]  Date Charge Nurse/RN Notified 10/28/19  Time Charge Nurse/RN Notified 1330  Document  Patient Outcome Not stable and remains on department  Progress note created (see row info) Yes  Pt arrived from [employer].  Has been tachypneic and Tachycardic. Will continue to monitor.

## 2019-10-29 NOTE — Progress Notes (Signed)
Physical Therapy Treatment Patient Details Name: [name] MRN: [id number] DOB: [birth year] Today's Date: 10/29/2019    History of Present Illness 49 year old male presents with decreased level of consciousness. Patient states that he took [name] just prior to arrival. Does complain of a fall,  woke this morning and had pain to the right side of his face. Also has bilateral lower extremity muscle aches, admitted with ARF and rhabdomyolysis.   Brain MRI-bilateral basal ganglia infarcts and posterior watershed infarcts on right. PMH: PE, tobacco abuse    PT Comments    Patient progressing well towards PT goals. Tolerated gait training today with MIn A and use of RW for support/safety. Requires manual assist to advance RLE; also with right foot drop. Pt noted to have 2-3/4 DOE with HR up to 131 bpm max. Pt able to perform LAQ sitting EOB with assist and noted to have 1/5 strength in hip flexors. Instructed pt in there ex to perform while in the hospital. Discharge recommendation to HHPT due to slow progress. Will follow.    Follow Up Recommendations  Home health PT;Supervision - Intermittent     Equipment Recommendations  Rolling walker with 5" wheels    Recommendations for Other Services       Precautions / Restrictions Precautions Precautions: Fall Precaution Comments: foot drop on RLE Restrictions Weight Bearing Restrictions: No    Mobility  Bed Mobility Overal bed mobility: Needs Assistance Bed Mobility: Supine to Sit     Supine to sit: Min guard;HOB elevated Sit to supine: Mod assist   General bed mobility comments: Able to get to EOB without assist; increased time/effort.  Transfers Overall transfer level: Needs assistance Equipment used: Rolling walker (2 wheeled) Transfers: Sit to/from Stand Sit to Stand: Min assist;Min guard Stand pivot transfers: Supervision;Min guard       General transfer comment: Min guard to stand from EOB x1, from toilet x1 with cues  for hand placement/technique. Transferred to chair post ambulation.  Ambulation/Gait Ambulation/Gait assistance: Min assist Gait Distance (Feet): 24 Feet (+ 15') Assistive device: Rolling walker (2 wheeled) Gait Pattern/deviations: Step-to pattern;Decreased step length - right;Wide base of support;Trunk flexed Gait velocity: decreased Gait velocity interpretation: <1.31 ft/sec, indicative of household ambulator General Gait Details: Slow, unsteady gait with assist to advance RLE, foot drop in right and increased WB through BUEs on RW. 2-3/4 DOE. HR up to 131 bpm. Right knee instability noted as well needing Min A at times for support. 1 seated rest break.   Stairs             Wheelchair Mobility    Modified Rankin (Stroke Patients Only)       Balance Overall balance assessment: Needs assistance Sitting-balance support: Feet supported;No upper extremity supported Sitting balance-Leahy Scale: Good     Standing balance support: During functional activity Standing balance-Leahy Scale: Poor Standing balance comment: Requires UE support ins tanding due to weakness through RLE.                            Cognition Arousal/Alertness: Awake/alert Behavior During Therapy: WFL for tasks assessed/performed Overall Cognitive Status: Within Functional Limits for tasks assessed                                 General Comments: no processing, complex thought, or command following deficits noted.      Exercises General Exercises -  Lower Extremity Quad Sets: AROM;Both;10 reps;Supine Long Arc Quad: AROM;AAROM;Right;10 reps;Seated Hip Flexion/Marching: AAROM;Right;5 reps    General Comments General comments (skin integrity, edema, etc.): HR up to 131 bpm with activity.      Pertinent Vitals/Pain Pain Assessment: 0-10 Pain Score: 7  Pain Location: RLE Pain Descriptors / Indicators: Grimacing;Sore Pain Intervention(s): Monitored during  session;Repositioned    Home Living Family/patient expects to be discharged to:: Private residence Living Arrangements: Spouse/significant other;Children Available Help at Discharge: Family Type of Home: House Home Access: Stairs to enter   Home Layout: One level        Prior Function Level of Independence: Independent          PT Goals (current goals can now be found in the care plan section) Acute Rehab PT Goals Patient Stated Goal: I want to use my R leg better and get back home. Progress towards PT goals: Progressing toward goals    Frequency    Min 3X/week      PT Plan Discharge plan needs to be updated    Co-evaluation              AM-PAC PT "6 Clicks" Mobility   Outcome Measure  Help needed turning from your back to your side while in a flat bed without using bedrails?: A Little Help needed moving from lying on your back to sitting on the side of a flat bed without using bedrails?: A Little Help needed moving to and from a bed to a chair (including a wheelchair)?: A Little Help needed standing up from a chair using your arms (e.g., wheelchair or bedside chair)?: A Little Help needed to walk in hospital room?: A Little Help needed climbing 3-5 steps with a railing? : A Lot 6 Click Score: 17    End of Session Equipment Utilized During Treatment: Gait belt Activity Tolerance: Patient limited by fatigue Patient left: in chair;with call bell/phone within reach Nurse Communication: Mobility status PT Visit Diagnosis: Difficulty in walking, not elsewhere classified (R26.2)     Time: 4917-9150 PT Time Calculation (min) (ACUTE ONLY): 31 min  Charges:  $Gait Training: 8-22 mins $Therapeutic Activity: 8-22 mins                     [name], PT, DPT Acute Rehabilitation Services Pager [phone] Office [phone]       Marguarite Arbour A Benedict 10/29/2019, 11:14 AM

## 2019-10-29 NOTE — Progress Notes (Signed)
  Echocardiogram 2D Echocardiogram has been performed.  [name] 10/29/2019, 2:51 PM

## 2019-10-29 NOTE — Progress Notes (Signed)
Patient back on floor from MRI. VSS. Alert and oriented x4.

## 2019-10-29 NOTE — Progress Notes (Signed)
PROGRESS NOTE    [name]  WGY:[id number] DOB: 1970-10-16 DOA: 10/27/2019 PCP: Lavone Orn, MD   Brief Narrative:  HPI from Dr Rayann Heman [name] is a 49 y.o. male with medical history significant for Crohn's disease, PE not currently on anticoagulation, and depression who presents to the ED for evaluation of right upper and lower extremity weakness and pain. Patient stated he was out drinking last night (10/26/2019) when he got intoxicated on alcohol, Molly, cocaine.  He blacked out and did not recall the events occurring later that night.  He apparently fell onto a brick floor kitchen at his friend's residence landing on the right side of his body and the right side of his face.  He woke up, unable to move his right arm or leg and had significant pain throughout his right upper and lower extremities.  He was brought to the ED by his significant other.  ED Course:  Initial vitals showed BP 63/40, pulse 115, RR 29, temp 97.7 Fahrenheit, SPO2 95% on room air. Labs significant for potassium >7.5, BUN 31, creatinine 5.26, bicarb 14, ALT 1851, anion gap 24, WBC 43.5, hemoglobin 19.3, hematocrit 56.4, platelets 276,000, serum ethanol elevated at 24, lactic acid 6.8, CK >50,000, UDS is positive for cocaine. CT head/maxillofacial/cervical spine without contrast were negative for acute intracranial abnormality. Portable chest x-ray unremarkable Right ankle and hip x-ray is negative for acute fracture or dislocation. MRI brain shows punctate acute infarcts in the right septal lobe and right cerebellum. CT abdomen/pelvis unremarkable.  Nephrology, neurology, orthopedic surgery were consulted.  Patient admitted for further management.   Assessment & Plan:   Principal Problem:   Acute renal failure due to rhabdomyolysis [facility]) Active Problems:   Hyperkalemia   Transaminitis   High anion gap metabolic acidosis   Alcohol use   Substance use disorder   Acute cerebral infarction (HCC)    Hypotension   Crohn's disease (HCC)   Lactic acidosis   Rhabdomyolysis with acute renal failure/high anion gap metabolic acidosis Hypotensive on arrival CK >50,000--> 22,988 Cr worsened 8.42 Nephrology consulted, started on bicarb drip, due to minimal UO, plan to start RRT Plan for [facility] on 10/30/19 Continue IVF, plan for RRT, continue Foley Daily CMP  Hyperkalemia Improving Secondary to above Continue lokelma  Hypocalcemia Repeat 1 dose of calcium gluconate 2 g Daily CMP  Transaminitis AST 4337, ALT 1851 on admission, will trend Multifactorial from rhabdomyolysis, hypoperfusion, alcohol use Normal acetaminophen level Daily CMP  Leukocytosis Afebrile, no signs of infection Likely reactive from rhabdomyolysis in addition to hemoconcentration  Right upper and lower extremity weakness/edema/pain/acute infarction of the right occipital lobe and right cerebellum/watershed stroke/globi pallidi MRI brain shows punctate acute infarcts in the right septal lobe and right cerebellum Neurology consulted, right-sided weakness likely peripheral in nature (nerve palsy), may need outpatient EMG if continues to be weak MRI cervical/lumbar spine fairly unremarkable, normal MRA of the head and neck Echo pending Orthopedics consulted for possible developing compartment syndrome, likely acute neurologic palsy, recommend outpatient EMG if no improvement PT/OT  Crohn's disease Follows with [facility], Dr. Havery Moros.  Has been on Remicade monotherapy.  Currently stable.  Hold Remicade.  Substance/alcohol abuse Denies any prior history of withdrawal UDS positive for cocaine, reports using Molly Continue CIWA, no standing Ativan at this time given multiple medical complications as above   DVT prophylaxis: heparin injection 5,000 Units Start: 10/27/19 2315   Code Status: Full Code  Family Communication: None present at bedside  Status is: Inpatient  Remains inpatient appropriate  because:Inpatient level of care appropriate due to severity of illness   Dispo: The patient is from: Home              Anticipated d/c is to: Home              Anticipated d/c date is: 3 days              Patient currently is not medically stable to d/c.     Estimated body mass index is 32.1 kg/m as calculated from the following:   Height as of this encounter: 5' 11"  (1.803 m).   Weight as of this encounter: 104.4 kg.      Nutritional status:               Consultants:   Neurology  Orthopedics  Nephrology  Procedures:   None  Antimicrobials:  Anti-infectives (From admission, onward)   None         Subjective: Pt seen and examined, still with RUE swelling and pain. Denies any new complaints. Denies N/V, fever/chills, chest pain, oriented.    Objective: Vitals:   10/28/19 2358 10/29/19 0346 10/29/19 0754 10/29/19 1128  BP: 125/71 118/78 128/76 121/67  Pulse: (!) 117 (!) 113 (!) 114 (!) 109  Resp: 18 20 20 20   Temp: 98.7 F (37.1 C) 98 F (36.7 C) 98.8 F (37.1 C) 99.4 F (37.4 C)  TempSrc: Oral Oral Oral Oral  SpO2: 98% 94% 95% 96%  Weight:  104.4 kg    Height:        Intake/Output Summary (Last 24 hours) at 10/29/2019 1416 Last data filed at 10/29/2019 1300 Gross per 24 hour  Intake 1614.59 ml  Output 350 ml  Net 1264.59 ml   Filed Weights   10/27/19 1616 10/29/19 0346  Weight: 95.7 kg 104.4 kg    Examination:  General: NAD   Cardiovascular: S1, S2 present  Respiratory: CTAB  Abdomen: Soft, nontender, nondistended, bowel sounds present  Musculoskeletal: No bilateral pedal edema noted  Skin: Normal  Psychiatry: Normal mood  Neurology: RUE 4/5, 1/5 RLE, no other focal neurologic deficit     Data Reviewed: I have personally reviewed following labs and imaging studies  CBC: Recent Labs  Lab 10/27/19 1705 10/27/19 2144 10/28/19 0500 10/29/19 0314  WBC 43.5*  --  25.2* 18.5*  NEUTROABS 37.4*  --   --  16.5*  HGB  19.3* 13.6 13.9 13.1  HCT 56.4* 40.0 42.0 38.6*  MCV 91.6  --  94.6 91.3  PLT 276  --  148* 409*   Basic Metabolic Panel: Recent Labs  Lab 10/27/19 1852 10/27/19 1953 10/27/19 2144 10/28/19 0137 10/28/19 0649 10/28/19 1635 10/29/19 0314  NA 147*   < > 140 139 137 135 132*  K >7.5*   < > 7.5* 7.0* 5.0 4.6 4.6  CL 109   < > 114* 110 106 100 97*  CO2 14*  --   --  13* 15* 16* 16*  GLUCOSE 96   < > 102* 137* 143* 133* 114*  BUN 31*   < > 38* 37* 46* 52* 63*  CREATININE 5.26*   < > 5.50* 5.46* 6.06* 7.26* 8.42*  CALCIUM 6.9*  --   --  5.8* 5.9* 5.9* 5.8*  MG  --   --   --   --   --   --  2.2  PHOS  --   --   --   --  10.1*  --   --    < > = values in this interval not displayed.   GFR: Estimated Creatinine Clearance: 13.2 mL/min (A) (by C-G formula based on SCr of 8.42 mg/dL (H)). Liver Function Tests: Recent Labs  Lab 10/27/19 1852 10/28/19 0500 10/28/19 0649 10/29/19 0314  AST 4,337* 9,139*  --  1,796*  ALT 1,851* 3,179*  --  1,133*  ALKPHOS 83 72  --  94  BILITOT 0.7 0.9  --  1.3*  PROT 7.1 5.5*  --  5.0*  ALBUMIN 3.9 3.0* 3.1* 2.6*   No results for input(s): LIPASE, AMYLASE in the last 168 hours. No results for input(s): AMMONIA in the last 168 hours. Coagulation Profile: No results for input(s): INR, PROTIME in the last 168 hours. Cardiac Enzymes: Recent Labs  Lab 10/27/19 1852 10/28/19 0137 10/29/19 0314  CKTOTAL >50,000* >50,000* 22,988*   BNP (last 3 results) No results for input(s): PROBNP in the last 8760 hours. HbA1C: Recent Labs    10/28/19 0649  HGBA1C 5.1   CBG: Recent Labs  Lab 10/27/19 1600 10/27/19 2259  GLUCAP 97 174*   Lipid Profile: Recent Labs    10/28/19 0649 10/29/19 0314  CHOL 182  --   HDL 20*  --   LDLCALC UNABLE TO CALCULATE IF TRIGLYCERIDE OVER 400 mg/dL  --   TRIG 462*  --   CHOLHDL 9.1  --   LDLDIRECT 66.0 56.7   Thyroid Function Tests: No results for input(s): TSH, T4TOTAL, FREET4, T3FREE, THYROIDAB in the  last 72 hours. Anemia Panel: No results for input(s): VITAMINB12, FOLATE, FERRITIN, TIBC, IRON, RETICCTPCT in the last 72 hours. Sepsis Labs: Recent Labs  Lab 10/27/19 1950 10/27/19 2249 10/28/19 0650  LATICACIDVEN 6.8* 5.4* 3.2*    Recent Results (from the past 240 hour(s))  Culture, blood (Routine X 2) w Reflex to ID Panel     Status: None (Preliminary result)   Collection Time: 10/27/19  8:43 PM   Specimen: BLOOD  Result Value Ref Range Status   Specimen Description   Final    BLOOD LEFT ANTECUBITAL Performed at [facility], [place] [address] [place], [place] [address]    Special Requests   Final    BOTTLES DRAWN AEROBIC AND ANAEROBIC Blood Culture results may not be optimal due to an excessive volume of blood received in culture bottles Performed at [place] [address], [place], [place] [address]    Culture   Final    NO GROWTH 1 DAY Performed at [facility], [place] [address], [place], [place] [address]    Report Status PENDING  Incomplete  SARS Coronavirus 2 by RT PCR (hospital order, performed in [facility] hospital lab) Nasopharyngeal Nasopharyngeal Swab     Status: None   Collection Time: 10/27/19  8:43 PM   Specimen: Nasopharyngeal Swab  Result Value Ref Range Status   SARS Coronavirus 2 NEGATIVE NEGATIVE Final    Comment: (NOTE) SARS-CoV-2 target nucleic acids are NOT DETECTED.  The SARS-CoV-2 RNA is generally detectable in upper and lower respiratory specimens during the acute phase of infection. The lowest concentration of SARS-CoV-2 viral copies this assay can detect is 250 copies / mL. A negative result does not preclude SARS-CoV-2 infection and should not be used as the sole basis for treatment or other patient management decisions.  A negative result may occur with improper specimen collection / handling, submission of specimen other than nasopharyngeal swab, presence of viral  mutation(s) within  the areas targeted by this assay, and inadequate number of viral copies (<250 copies / mL). A negative result must be combined with clinical observations, patient history, and epidemiological information.  Fact Sheet for Patients:   StrictlyIdeas.no  Fact Sheet for Healthcare Providers: BankingDealers.co.za  This test is not yet approved or  cleared by the [place] FDA and has been authorized for detection and/or diagnosis of SARS-CoV-2 by FDA under an Emergency Use Authorization (EUA).  This EUA will remain in effect (meaning this test can be used) for the duration of the COVID-19 declaration under Section 564(b)(1) of the Act, 21 U.S.C. section 360bbb-3(b)(1), unless the authorization is terminated or revoked sooner.  Performed at [facility], [place] [address] [place], [place] [address]   Culture, blood (Routine X 2) w Reflex to ID Panel     Status: None (Preliminary result)   Collection Time: 10/27/19  9:12 PM   Specimen: BLOOD  Result Value Ref Range Status   Specimen Description   Final    BLOOD RIGHT ANTECUBITAL Performed at [place] [address] [place], [place] [address]    Special Requests   Final    BOTTLES DRAWN AEROBIC AND ANAEROBIC Blood Culture adequate volume Performed at [place] [address] [place], [place] [address]    Culture   Final    NO GROWTH 1 DAY Performed at [facility], [place] [address] [place], [place] [address]    Report Status PENDING  Incomplete      Radiology Studies: CT Abdomen Pelvis Wo Contrast  Result Date: 10/27/2019 CLINICAL DATA:  Abdominal distension and pain EXAM: CT ABDOMEN AND PELVIS WITHOUT CONTRAST TECHNIQUE: Multidetector CT imaging of the abdomen and pelvis was performed following the standard protocol without IV contrast. COMPARISON:  None. FINDINGS: Lower chest: The visualized heart size within  normal limits. No pericardial fluid/thickening. No hiatal hernia. Mild streaky patchy airspace opacity seen at the right lung base. Hepatobiliary: Although limited due to the lack of intravenous contrast, normal in appearance without gross focal abnormality. No evidence of calcified gallstones or biliary ductal dilatation. Pancreas:  Unremarkable.  No surrounding inflammatory changes. Spleen: Normal in size. Although limited due to the lack of intravenous contrast, normal in appearance. Adrenals/Urinary Tract: Both adrenal glands appear normal. The kidneys and collecting system appear normal without evidence of urinary tract calculus or hydronephrosis. Bladder is unremarkable. Stomach/Bowel: The stomach, small bowel, are normal in appearance. There is a small anterior fat and tiny portion of transverse colon containing hernia seen along the anterior abdominal wall. A surgical anastomosis seen within the left distal colon. No areas of focal wall thickening or bowel dilatation are seen. There is a moderate amount of colonic stool present. Vascular/Lymphatic: There are no enlarged abdominal or pelvic lymph nodes. Scattered mild aortic atherosclerosis is seen. Reproductive: The prostate is unremarkable. Other: No evidence of abdominal wall mass or hernia. Musculoskeletal: No acute or significant osseous findings. Heterotopic ossifications are seen around the right hip. IMPRESSION: Small portion of transverse colon and fat containing anterior umbilical hernia, however no definite evidence bowel obstruction or strangulation. Moderate amount of colonic stool. Patchy streaky airspace opacity at the right lung base which may be due to atelectasis and/or infectious etiology. Aortic Atherosclerosis (ICD10-I70.0). Electronically Signed   By: Prudencio Pair M.D.   On: 10/27/2019 20:50   DG Ankle 2 Views Right  Result Date: 10/27/2019 CLINICAL DATA:  Trauma, fall.  Right  ankle pain. EXAM: RIGHT ANKLE - 2 VIEW COMPARISON:  None.  FINDINGS: No acute fracture or dislocation. There is slight lateral talar tilt. No mortise widening. No ankle joint effusion. No focal soft tissue abnormality. IMPRESSION: No acute fracture or dislocation. Slight lateral talar tilt. Electronically Signed   By: Keith Rake M.D.   On: 10/27/2019 16:59   CT Head Wo Contrast  Result Date: 10/27/2019 CLINICAL DATA:  Post injury, syncopal episode and fall. EXAM: CT HEAD WITHOUT CONTRAST CT MAXILLOFACIAL WITHOUT CONTRAST CT CERVICAL SPINE WITHOUT CONTRAST TECHNIQUE: Multidetector CT imaging of the head, cervical spine, and maxillofacial structures were performed using the standard protocol without intravenous contrast. Multiplanar CT image reconstructions of the cervical spine and maxillofacial structures were also generated. COMPARISON:  None. FINDINGS: CT HEAD FINDINGS Brain: No evidence of acute infarction, hemorrhage, hydrocephalus, extra-axial collection or mass lesion/mass effect. Vascular: No hyperdense vessel or unexpected calcification. Skull: Normal. Negative for fracture or focal lesion. Other: None. CT MAXILLOFACIAL FINDINGS Osseous: No fracture or mandibular dislocation. No destructive process. Orbits: Negative. No traumatic or inflammatory finding. Sinuses: Clear. Soft tissues: Negative. CT CERVICAL SPINE FINDINGS Alignment: Normal. Skull base and vertebrae: No acute fracture. No primary bone lesion or focal pathologic process. Soft tissues and spinal canal: No prevertebral fluid or swelling. No visible canal hematoma. Disc levels:  Mild osteoarthritic changes at C2-C3, C3-C4 and C4-C5. Upper chest: Negative. Other: None. IMPRESSION: 1. No acute intracranial abnormality. 2. No evidence of acute traumatic injury to the cervical spine. 3. No evidence of facial fractures. 4. Mild osteoarthritic changes of the cervical spine. Electronically Signed   By: Fidela Salisbury M.D.   On: 10/27/2019 16:51   CT Cervical Spine Wo Contrast  Result Date:  10/27/2019 CLINICAL DATA:  Post injury, syncopal episode and fall. EXAM: CT HEAD WITHOUT CONTRAST CT MAXILLOFACIAL WITHOUT CONTRAST CT CERVICAL SPINE WITHOUT CONTRAST TECHNIQUE: Multidetector CT imaging of the head, cervical spine, and maxillofacial structures were performed using the standard protocol without intravenous contrast. Multiplanar CT image reconstructions of the cervical spine and maxillofacial structures were also generated. COMPARISON:  None. FINDINGS: CT HEAD FINDINGS Brain: No evidence of acute infarction, hemorrhage, hydrocephalus, extra-axial collection or mass lesion/mass effect. Vascular: No hyperdense vessel or unexpected calcification. Skull: Normal. Negative for fracture or focal lesion. Other: None. CT MAXILLOFACIAL FINDINGS Osseous: No fracture or mandibular dislocation. No destructive process. Orbits: Negative. No traumatic or inflammatory finding. Sinuses: Clear. Soft tissues: Negative. CT CERVICAL SPINE FINDINGS Alignment: Normal. Skull base and vertebrae: No acute fracture. No primary bone lesion or focal pathologic process. Soft tissues and spinal canal: No prevertebral fluid or swelling. No visible canal hematoma. Disc levels:  Mild osteoarthritic changes at C2-C3, C3-C4 and C4-C5. Upper chest: Negative. Other: None. IMPRESSION: 1. No acute intracranial abnormality. 2. No evidence of acute traumatic injury to the cervical spine. 3. No evidence of facial fractures. 4. Mild osteoarthritic changes of the cervical spine. Electronically Signed   By: Fidela Salisbury M.D.   On: 10/27/2019 16:51   MR ANGIO HEAD WO CONTRAST  Result Date: 10/29/2019 CLINICAL DATA:  Left-sided weakness EXAM: MRA HEAD WITHOUT CONTRAST MRA NECK WITHOUT CONTRAST TECHNIQUE: Angiographic images of the Circle of Willis were obtained using MRA technique without intravenous contrast. Angiographic images of the neck were obtained using MRA technique without intravenous contrast. Carotid stenosis measurements (when  applicable) are obtained utilizing NASCET criteria, using the distal internal carotid diameter as the denominator. COMPARISON:  None. FINDINGS: MRA HEAD FINDINGS POSTERIOR CIRCULATION: --Vertebral arteries: Normal  V4 segments. --Inferior cerebellar arteries: Normal. --Basilar artery: Normal. --Superior cerebellar arteries: Normal. --Posterior cerebral arteries: Normal. The left PCA is predominantly supplied by the posterior communicating artery. ANTERIOR CIRCULATION: --Intracranial internal carotid arteries: Normal. --Anterior cerebral arteries (ACA): Normal. Both A1 segments are present. Patent anterior communicating artery (a-comm). --Middle cerebral arteries (MCA): Normal. MRA NECK FINDINGS Normal vertebral and carotid arteries. IMPRESSION: Normal MRA of the head and neck. Electronically Signed   By: Ulyses Jarred M.D.   On: 10/29/2019 00:25   MR ANGIO NECK WO CONTRAST  Result Date: 10/29/2019 CLINICAL DATA:  Left-sided weakness EXAM: MRA HEAD WITHOUT CONTRAST MRA NECK WITHOUT CONTRAST TECHNIQUE: Angiographic images of the Circle of Willis were obtained using MRA technique without intravenous contrast. Angiographic images of the neck were obtained using MRA technique without intravenous contrast. Carotid stenosis measurements (when applicable) are obtained utilizing NASCET criteria, using the distal internal carotid diameter as the denominator. COMPARISON:  None. FINDINGS: MRA HEAD FINDINGS POSTERIOR CIRCULATION: --Vertebral arteries: Normal V4 segments. --Inferior cerebellar arteries: Normal. --Basilar artery: Normal. --Superior cerebellar arteries: Normal. --Posterior cerebral arteries: Normal. The left PCA is predominantly supplied by the posterior communicating artery. ANTERIOR CIRCULATION: --Intracranial internal carotid arteries: Normal. --Anterior cerebral arteries (ACA): Normal. Both A1 segments are present. Patent anterior communicating artery (a-comm). --Middle cerebral arteries (MCA): Normal. MRA  NECK FINDINGS Normal vertebral and carotid arteries. IMPRESSION: Normal MRA of the head and neck. Electronically Signed   By: Ulyses Jarred M.D.   On: 10/29/2019 00:25   MR Brain Wo Contrast (neuro protocol)  Result Date: 10/27/2019 CLINICAL DATA:  Syncope.  Right-sided weakness. EXAM: MRI HEAD WITHOUT CONTRAST TECHNIQUE: Multiplanar, multiecho pulse sequences of the brain and surrounding structures were obtained without intravenous contrast. COMPARISON:  Head CT 10/27/2019 FINDINGS: Brain: There is symmetric restricted diffusion and T2 hyperintensity in the globi pallidi. There are also 2 subcentimeter foci of restricted diffusion in the right occipital lobe and a single punctate focus of restricted diffusion in the right cerebellum. The brain is normal in signal elsewhere. No intracranial hemorrhage, mass, midline shift, or extra-axial fluid collection is identified. The ventricles and sulci are normal. Vascular: Major intracranial vascular flow voids are preserved. Skull and upper cervical spine: Unremarkable bone marrow signal. Sinuses/Orbits: Unremarkable orbits. Paranasal sinuses and mastoid air cells are clear. Other: None. IMPRESSION: 1. Symmetric restricted diffusion in the globi pallidi. This is most often seen with an acute toxic or metabolic insult (including carbon monoxide poisoning and drugs of abuse) or hypoxic-ischemic injury. No hemorrhage. 2. Punctate acute infarcts in the right occipital lobe and right cerebellum. Electronically Signed   By: Logan Bores M.D.   On: 10/27/2019 19:01   MR CERVICAL SPINE WO CONTRAST  Result Date: 10/29/2019 CLINICAL DATA:  Right upper and lower extremity weakness EXAM: MRI CERVICAL SPINE WITHOUT CONTRAST TECHNIQUE: Multiplanar, multisequence MR imaging of the cervical spine was performed. No intravenous contrast was administered. COMPARISON:  None. FINDINGS: Alignment: Reversal of normal cervical lordosis may be positional or due to muscle spasm. Vertebrae: No  fracture, evidence of discitis, or bone lesion. Cord: Normal signal and morphology. Posterior Fossa, vertebral arteries, paraspinal tissues: Visualized posterior fossa is normal. Vertebral artery flow voids are preserved. No prevertebral soft tissue swelling. Disc levels: C1-2: Small central disc protrusion. C2-3: Normal disc space and facet joints. There is no spinal canal stenosis. No neural foraminal stenosis. C3-4: Small central disc protrusion with minimal superior migration . Mild spinal canal stenosis. Mild left neural foraminal stenosis. C4-5: Small disc bulge  with bilateral facet hypertrophy and left greater than right uncovertebral spurring. There is no spinal canal stenosis. Mild right, moderate left neural foraminal stenosis. C5-6: Normal disc space and facet joints. There is no spinal canal stenosis. No neural foraminal stenosis. C6-7: Normal disc space and facet joints. There is no spinal canal stenosis. No neural foraminal stenosis. C7-T1: Normal disc space and facet joints. There is no spinal canal stenosis. No neural foraminal stenosis. IMPRESSION: 1. Mild spinal canal stenosis at C3-4 secondary to small central disc protrusion with minimal superior migration. 2. Mild right and moderate left C4-5 neural foraminal stenosis. 3. Mild left C3-4 neural foraminal stenosis. Electronically Signed   By: Ulyses Jarred M.D.   On: 10/29/2019 00:09   MR LUMBAR SPINE WO CONTRAST  Result Date: 10/29/2019 CLINICAL DATA:  Left lower extremity weakness EXAM: MRI LUMBAR SPINE WITHOUT CONTRAST TECHNIQUE: Multiplanar, multisequence MR imaging of the lumbar spine was performed. No intravenous contrast was administered. COMPARISON:  None. FINDINGS: Segmentation:  Standard. Alignment:  Physiologic. Vertebrae:  No fracture, evidence of discitis, or bone lesion. Conus medullaris and cauda equina: Conus extends to the L1 level. Conus and cauda equina appear normal. Paraspinal and other soft tissues: Negative. Disc levels:  There is no disc herniation, spinal canal stenosis or neural foraminal stenosis. There is mild facet arthrosis at L4-5 and L5-S1. IMPRESSION: 1. No disc herniation, spinal canal stenosis or neural foraminal stenosis. 2. Mild facet arthrosis at L4-5 and L5-S1. Electronically Signed   By: Ulyses Jarred M.D.   On: 10/29/2019 00:16   US RENAL  Result Date: 10/28/2019 CLINICAL DATA:  Acute kidney injury EXAM: RENAL / URINARY TRACT ULTRASOUND COMPLETE COMPARISON:  CT abdomen/pelvis dated 10/27/2019 FINDINGS: Right Kidney: Renal measurements: 11.5 x 6.3 x 6.8 cm = volume: 257 mL. Echogenicity within normal limits. No mass or hydronephrosis visualized. Left Kidney: Renal measurements: 10.0 x 7.0 x 6.1 cm = volume: 223 mL. Echogenicity within normal limits. No mass or hydronephrosis visualized. Bladder: Appears normal for degree of bladder distention. Other: None. IMPRESSION: Negative renal ultrasound.  No hydronephrosis. Electronically Signed   By: Julian Hy M.D.   On: 10/28/2019 08:40   DG Chest Port 1 View  Result Date: 10/27/2019 CLINICAL DATA:  Trauma.  Fall onto right side. EXAM: PORTABLE CHEST 1 VIEW COMPARISON:  Chest radiograph 03/06/2010, no interval imaging available. FINDINGS: Lung volumes are low. Mild elevation of right hemidiaphragm. Upper normal heart size likely accentuated by technique. No evidence of pneumothorax. Streaky opacities at the right greater than left lung base typical of atelectasis. No acute osseous abnormalities are seen. Right anterior fourth rib fracture appears remote. IMPRESSION: Low lung volumes with bibasilar atelectasis. No evidence of acute traumatic injury. Electronically Signed   By: Keith Rake M.D.   On: 10/27/2019 16:55   DG HIP UNILAT WITH PELVIS 2-3 VIEWS RIGHT  Result Date: 10/27/2019 CLINICAL DATA:  Fall onto right side.  History of right hip surgery. EXAM: DG HIP (WITH OR WITHOUT PELVIS) 2-3V RIGHT COMPARISON:  Remote radiograph 06/28/2009 FINDINGS: No  evidence of acute fracture. There is heterotopic ossification about the right hip. This appears increased from prior exam. Femoral head is seated in the acetabulum. Pubic rami are intact. Pubic symphysis and sacroiliac joints are congruent. There is slight flattening of the left femoral head neck junction that is unchanged from prior. IMPRESSION: No acute fracture or subluxation of the pelvis or right hip. Electronically Signed   By: Keith Rake M.D.   On: 10/27/2019  16:57   CT Maxillofacial WO CM  Result Date: 10/27/2019 CLINICAL DATA:  Post injury, syncopal episode and fall. EXAM: CT HEAD WITHOUT CONTRAST CT MAXILLOFACIAL WITHOUT CONTRAST CT CERVICAL SPINE WITHOUT CONTRAST TECHNIQUE: Multidetector CT imaging of the head, cervical spine, and maxillofacial structures were performed using the standard protocol without intravenous contrast. Multiplanar CT image reconstructions of the cervical spine and maxillofacial structures were also generated. COMPARISON:  None. FINDINGS: CT HEAD FINDINGS Brain: No evidence of acute infarction, hemorrhage, hydrocephalus, extra-axial collection or mass lesion/mass effect. Vascular: No hyperdense vessel or unexpected calcification. Skull: Normal. Negative for fracture or focal lesion. Other: None. CT MAXILLOFACIAL FINDINGS Osseous: No fracture or mandibular dislocation. No destructive process. Orbits: Negative. No traumatic or inflammatory finding. Sinuses: Clear. Soft tissues: Negative. CT CERVICAL SPINE FINDINGS Alignment: Normal. Skull base and vertebrae: No acute fracture. No primary bone lesion or focal pathologic process. Soft tissues and spinal canal: No prevertebral fluid or swelling. No visible canal hematoma. Disc levels:  Mild osteoarthritic changes at C2-C3, C3-C4 and C4-C5. Upper chest: Negative. Other: None. IMPRESSION: 1. No acute intracranial abnormality. 2. No evidence of acute traumatic injury to the cervical spine. 3. No evidence of facial fractures. 4.  Mild osteoarthritic changes of the cervical spine. Electronically Signed   By: Fidela Salisbury M.D.   On: 10/27/2019 16:51    Scheduled Meds: . aspirin EC  81 mg Oral Daily  . Chlorhexidine Gluconate Cloth  6 each Topical Daily  . Chlorhexidine Gluconate Cloth  6 each Topical Q0600  . heparin  5,000 Units Subcutaneous Q8H  . sodium zirconium cyclosilicate  10 g Oral BID   Continuous Infusions: . sodium chloride    . sodium chloride       LOS: 2 days     Alma Friendly, MD [facility]  10/29/2019, 2:16 PM

## 2019-10-29 NOTE — Progress Notes (Signed)
Report given to [facility], Therapist, sports. Patient is drowsy though oriented x4 sitting up in bed. ST on tele. PT/OT to see patient today. LR infusing @ 75 mL/hr.  Ortho suggesting EMG as outpatient depending on patient's progress. Neuro ordered MR of cervical and spine and MRA head and neck overnight and all completed. Foley WNL. Modified NIHSS Q4 in progress. Echo today.  This AM WBC 18.5, Hgb 13.1, platelets 137, Na+ 132, K+ 4.6, Creat 8.42 (worsening), Ca2+ 5.8 (despite IV calcium gluconate given overnight). Critical result sent to [facility] on call.

## 2019-10-30 ENCOUNTER — Inpatient Hospital Stay (HOSPITAL_COMMUNITY): Payer: BC Managed Care – PPO

## 2019-10-30 HISTORY — PX: IR US GUIDE VASC ACCESS RIGHT: IMG2390

## 2019-10-30 HISTORY — PX: IR FLUORO GUIDE CV LINE RIGHT: IMG2283

## 2019-10-30 LAB — COMPREHENSIVE METABOLIC PANEL
ALT: 2474 U/L — ABNORMAL HIGH (ref 0–44)
AST: 2731 U/L — ABNORMAL HIGH (ref 15–41)
Albumin: 2.3 g/dL — ABNORMAL LOW (ref 3.5–5.0)
Alkaline Phosphatase: 77 U/L (ref 38–126)
Anion gap: 20 — ABNORMAL HIGH (ref 5–15)
BUN: 93 mg/dL — ABNORMAL HIGH (ref 6–20)
CO2: 17 mmol/L — ABNORMAL LOW (ref 22–32)
Calcium: 5.4 mg/dL — CL (ref 8.9–10.3)
Chloride: 99 mmol/L (ref 98–111)
Creatinine, Ser: 11.09 mg/dL — ABNORMAL HIGH (ref 0.61–1.24)
GFR calc Af Amer: 6 mL/min — ABNORMAL LOW (ref >60)
GFR calc non Af Amer: 5 mL/min — ABNORMAL LOW (ref >60)
Glucose, Bld: 100 mg/dL — ABNORMAL HIGH (ref 70–99)
Potassium: 4.1 mmol/L (ref 3.5–5.1)
Sodium: 136 mmol/L (ref 135–145)
Total Bilirubin: 1.2 mg/dL (ref 0.3–1.2)
Total Protein: 4.7 g/dL — ABNORMAL LOW (ref 6.5–8.1)

## 2019-10-30 LAB — CBC WITH DIFFERENTIAL/PLATELET
Abs Immature Granulocytes: 0.25 10*3/uL — ABNORMAL HIGH (ref 0.00–0.07)
Basophils Absolute: 0 10*3/uL (ref 0.0–0.1)
Basophils Relative: 0 %
Eosinophils Absolute: 0 10*3/uL (ref 0.0–0.5)
Eosinophils Relative: 0 %
HCT: 32.5 % — ABNORMAL LOW (ref 39.0–52.0)
Hemoglobin: 10.9 g/dL — ABNORMAL LOW (ref 13.0–17.0)
Immature Granulocytes: 2 %
Lymphocytes Relative: 7 %
Lymphs Abs: 0.8 10*3/uL (ref 0.7–4.0)
MCH: 30.3 pg (ref 26.0–34.0)
MCHC: 33.5 g/dL (ref 30.0–36.0)
MCV: 90.3 fL (ref 80.0–100.0)
Monocytes Absolute: 0.5 10*3/uL (ref 0.1–1.0)
Monocytes Relative: 4 %
Neutro Abs: 10.2 10*3/uL — ABNORMAL HIGH (ref 1.7–7.7)
Neutrophils Relative %: 87 %
Platelets: 115 10*3/uL — ABNORMAL LOW (ref 150–400)
RBC: 3.6 MIL/uL — ABNORMAL LOW (ref 4.22–5.81)
RDW: 13.3 % (ref 11.5–15.5)
WBC: 11.8 10*3/uL — ABNORMAL HIGH (ref 4.0–10.5)
nRBC: 0.2 % (ref 0.0–0.2)

## 2019-10-30 LAB — CK: Total CK: >50000 U/L — ABNORMAL HIGH (ref 49–397)

## 2019-10-30 LAB — HEPATITIS B SURFACE ANTIBODY, QUANTITATIVE: Hep B S AB Quant (Post): 4.6 m[IU]/mL — ABNORMAL LOW (ref >9.9)

## 2019-10-30 IMAGING — XA IR FLUORO GUIDE CV LINE*R*
2 series · 4 of 4 positions shown · non-contrast
Comparison: none

INDICATION: 49-year-old male with a history of rhabdomyolysis, referred for
tunneled hemodialysis catheter

[Series 1: fl (-) angio · 1 of 1 slices shown]
[im 1/1]
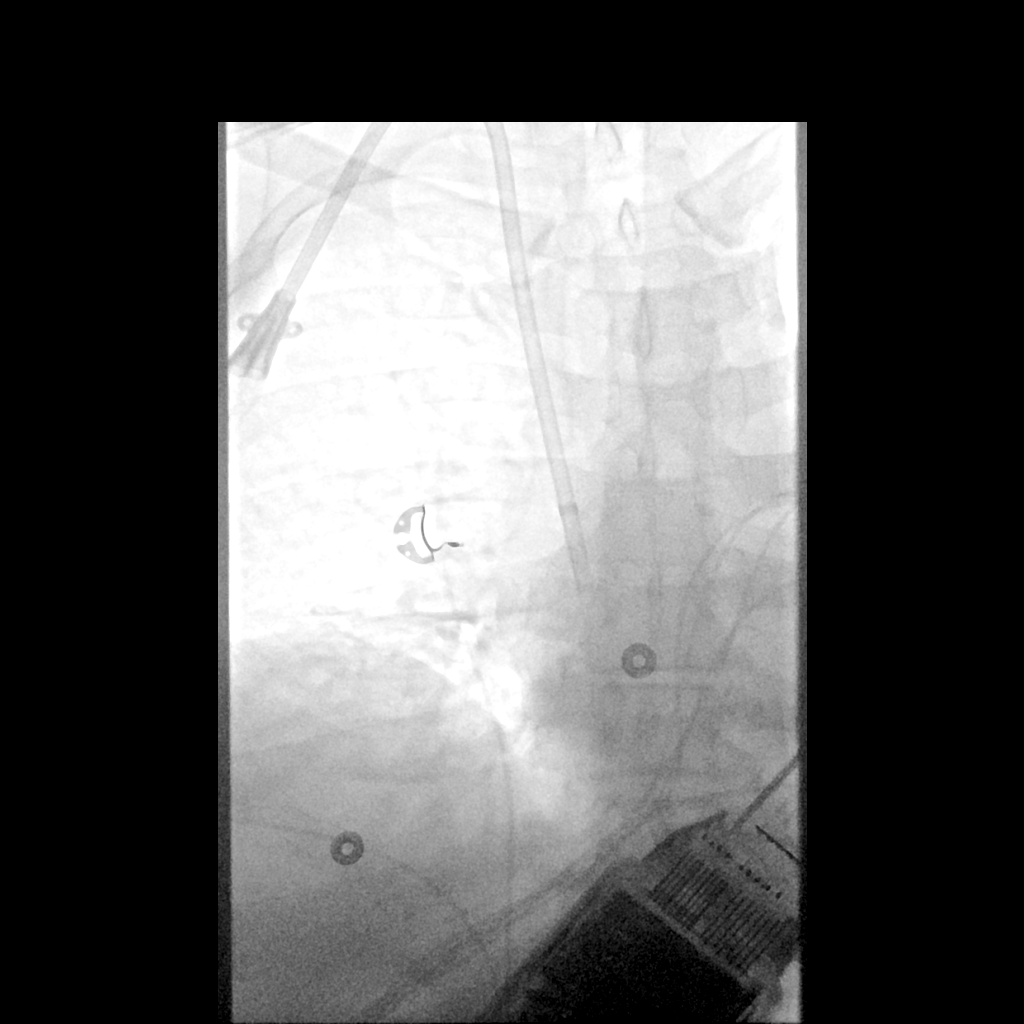

[Series 1: ir fluoro guide cv line*right* · 3 of 3 slices shown]
[im 1/3]
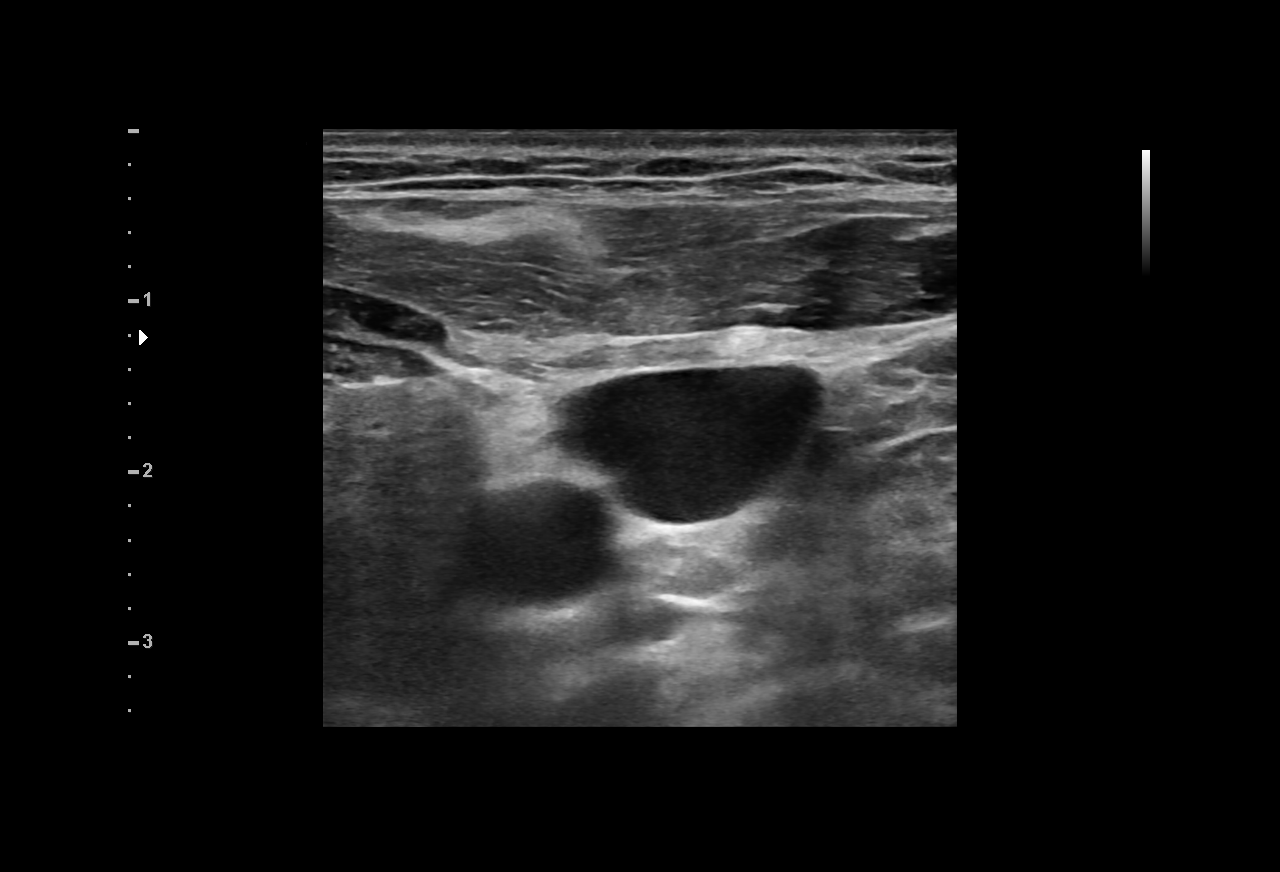
[im 2/3]
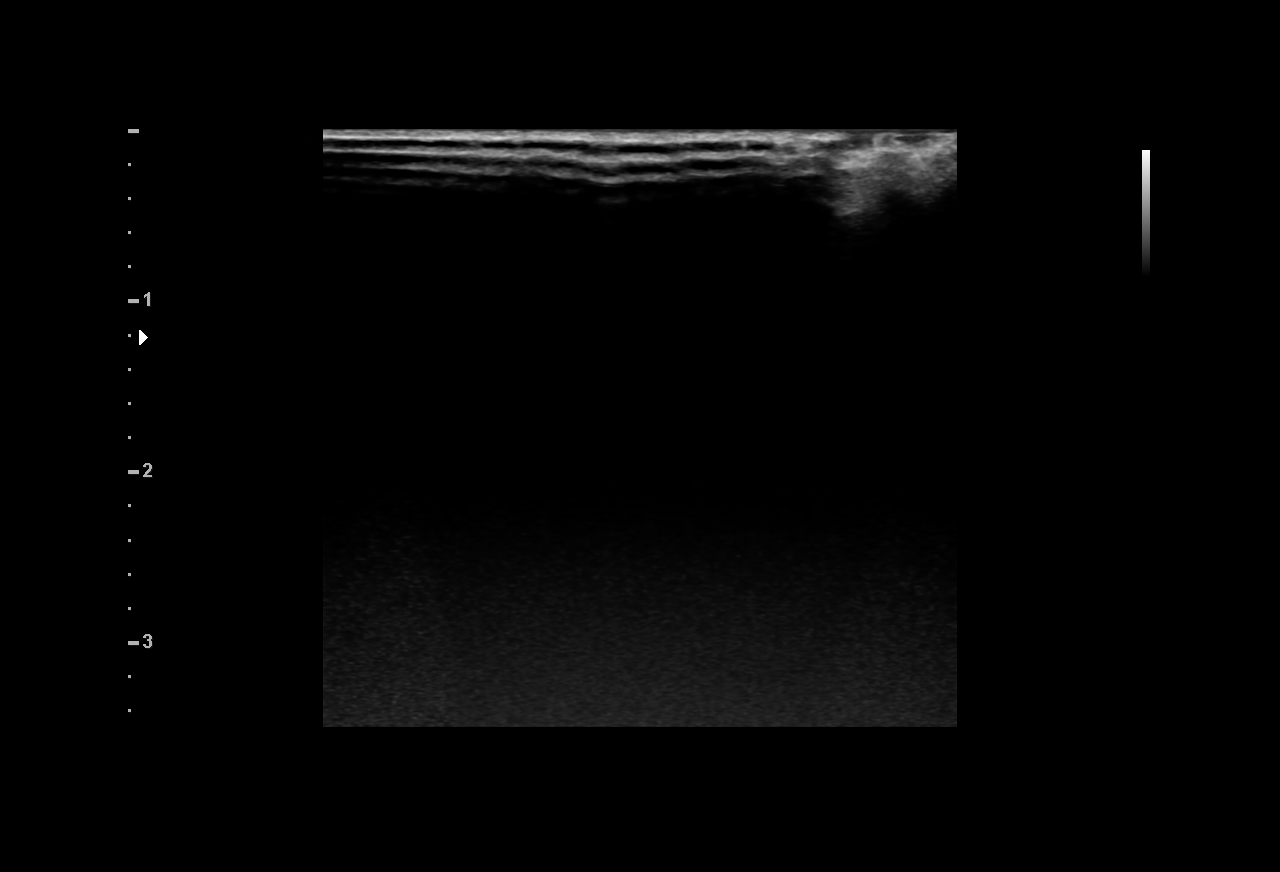
[im 3/3]
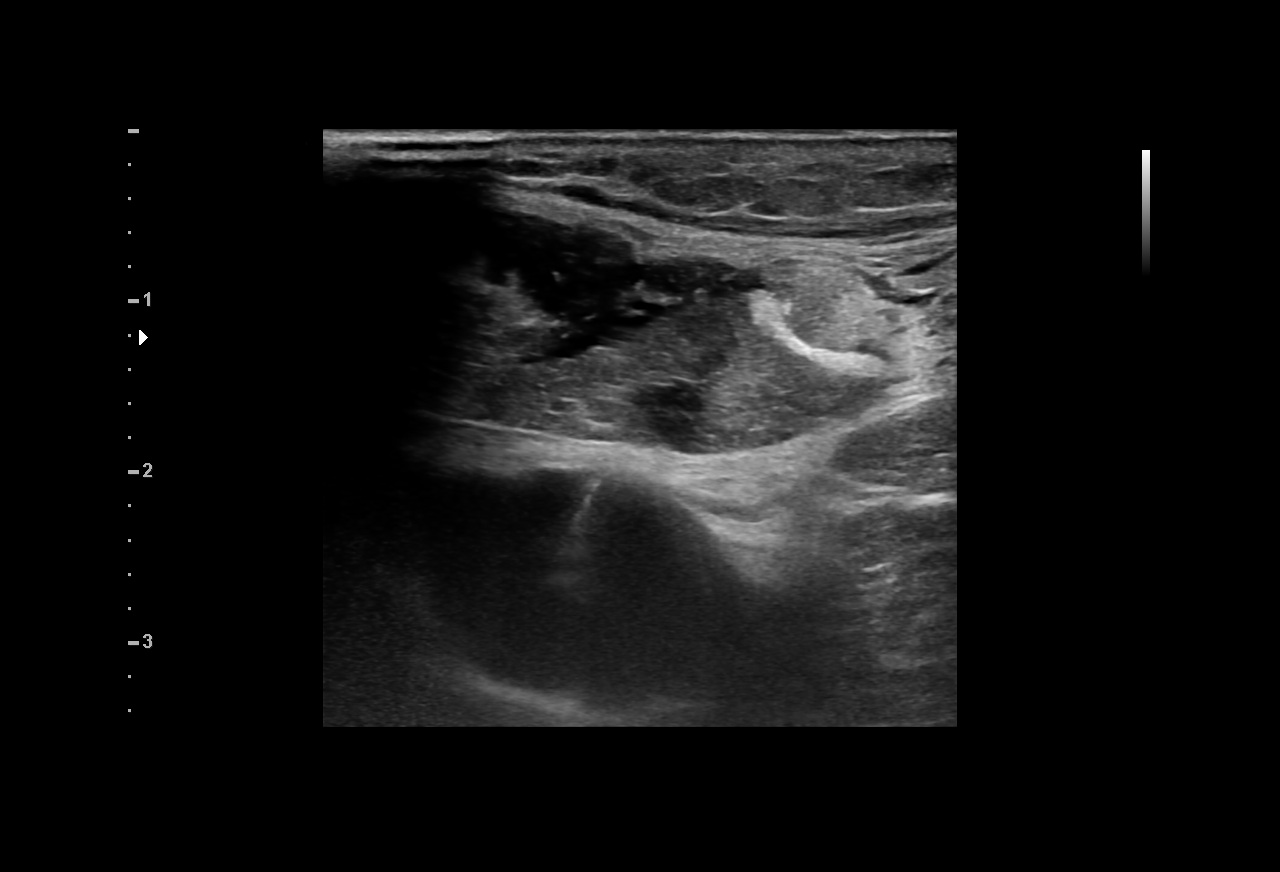

[4 of 4 positions shown; findings below may reference images not displayed]

EXAM:
IMAGE GUIDED PLACEMENT OF TUNNELED HEMODIALYSIS CATHETER

MEDICATIONS:
2 g Ancef; The antibiotic was administered within an appropriate
time interval prior to skin puncture.

ANESTHESIA/SEDATION:
Moderate (conscious) sedation was employed during this procedure. A
total of Versed 2.0 mg and Fentanyl 100 mcg was administered
intravenously.

Moderate Sedation Time: 18 minutes. The patient's level of
consciousness and vital signs were monitored continuously by
radiology nursing throughout the procedure under my direct
supervision.

FLUOROSCOPY TIME:  Fluoroscopy Time: 0 minutes 6 seconds (1 mGy).

COMPLICATIONS:
None



Ultrasound survey was performed.

Micropuncture kit was utilized to access the right internal jugular
vein under direct, real-time ultrasound guidance after the overlying
soft tissues were anesthetized with 1% lidocaine with epinephrine.
Stab incision was made with 11 blade scalpel. Microwire was passed
centrally. The microwire was then marked to measure appropriate
internal catheter length. External tunneled length was estimated. A
total tip to cuff length of 19 cm was selected.

035 guidewire was advanced to the level of the IVC.

Skin and subcutaneous tissues of chest wall below the clavicle were
generously infiltrated with 1% lidocaine for local anesthesia. A
small stab incision was made with 11 blade scalpel. The selected
hemodialysis catheter was tunneled in a retrograde fashion from the
anterior chest wall to the venotomy incision.

Serial dilation was performed and then a peel-away sheath was
placed.

The catheter was then placed through the peel-away sheath with tips
ultimately positioned within the superior aspect of the right
atrium. Final catheter positioning was confirmed and documented with
a spot radiographic image. The catheter aspirates and flushes
normally. The catheter was flushed with appropriate volume heparin
dwells.

The catheter exit site was secured with a 0-Prolene retention
suture. Gel-Foam slurry was infused into the soft tissue tract.

The venotomy incision was closed Derma bond and sterile dressing.
Dressings were applied at the chest wall.

Patient tolerated the procedure well and remained hemodynamically
stable throughout.

No complications were encountered and no significant blood loss
encountered.
IMPRESSION: Status post right IJ tunneled hemodialysis catheter.

## 2019-10-30 MED ORDER — MIDAZOLAM HCL 2 MG/2ML IJ SOLN
INTRAMUSCULAR | Status: AC | PRN
  Administered 2019-10-30: 1 mg via INTRAVENOUS

## 2019-10-30 MED ORDER — HEPARIN SODIUM (PORCINE) 1000 UNIT/ML IJ SOLN
INTRAMUSCULAR | Status: AC
  Filled 2019-10-30: qty 1

## 2019-10-30 MED ORDER — FENTANYL CITRATE (PF) 100 MCG/2ML IJ SOLN
INTRAMUSCULAR | Status: AC
Start: 2019-10-30 — End: 2019-10-31
  Filled 2019-10-30: qty 2

## 2019-10-30 MED ORDER — CEFAZOLIN SODIUM-DEXTROSE 2-4 GM/100ML-% IV SOLN
INTRAVENOUS | Status: AC
  Administered 2019-10-30: 2000 mg
  Filled 2019-10-30: qty 100

## 2019-10-30 MED ORDER — MIDAZOLAM HCL 2 MG/2ML IJ SOLN
INTRAMUSCULAR | Status: AC
  Filled 2019-10-30: qty 2

## 2019-10-30 MED ORDER — GELATIN ABSORBABLE 12-7 MM EX MISC
CUTANEOUS | Status: AC | PRN
  Administered 2019-10-30: 1 via TOPICAL

## 2019-10-30 MED ORDER — FENTANYL CITRATE (PF) 100 MCG/2ML IJ SOLN
INTRAMUSCULAR | Status: AC | PRN
  Administered 2019-10-30: 50 ug via INTRAVENOUS

## 2019-10-30 MED ORDER — LIDOCAINE HCL 1 % IJ SOLN
INTRAMUSCULAR | Status: AC | PRN
  Administered 2019-10-30: 15 mL

## 2019-10-30 MED ORDER — GELATIN ABSORBABLE 12-7 MM EX MISC
CUTANEOUS | Status: AC
  Filled 2019-10-30: qty 1

## 2019-10-30 MED ORDER — CALCIUM GLUCONATE-NACL 2-0.675 GM/100ML-% IV SOLN
2.0000 g | Freq: Once | INTRAVENOUS | Status: AC
  Administered 2019-10-30: 2000 mg via INTRAVENOUS
  Filled 2019-10-30: qty 100

## 2019-10-30 MED ORDER — LIDOCAINE HCL 1 % IJ SOLN
INTRAMUSCULAR | Status: AC
  Filled 2019-10-30: qty 20

## 2019-10-30 MED ORDER — HEPARIN SODIUM (PORCINE) 1000 UNIT/ML IJ SOLN
INTRAMUSCULAR | Status: AC | PRN
  Administered 2019-10-30 (×2): 3.2 mL via INTRAVENOUS

## 2019-10-30 NOTE — Progress Notes (Signed)
Physical Therapy Treatment Patient Details Name: [name] MRN: [id number] DOB: April 09, 1970 Today's Date: 10/30/2019    History of Present Illness 49 year old male presents with decreased level of consciousness. Patient states that he took [name] just prior to arrival. Does complain of a fall,  woke this morning and had pain to the right side of his face. Also has bilateral lower extremity muscle aches, admitted with ARF and rhabdomyolysis.   Brain MRI-bilateral basal ganglia infarcts and posterior watershed infarcts on right. PMH: PE, tobacco abuse    PT Comments    Pt eager to be up on his leg.  Emphasis on stretch and warm up ROM, transfer safety and gait stability/quality.    Follow Up Recommendations  Home health PT;Supervision - Intermittent     Equipment Recommendations  Rolling walker with 5" wheels    Recommendations for Other Services       Precautions / Restrictions Precautions Precautions: Fall    Mobility  Bed Mobility Overal bed mobility: Needs Assistance Bed Mobility: Supine to Sit     Supine to sit: Min guard (mild struggle to get R LE to EOB and move up/forward.) Sit to supine: Min assist      Transfers Overall transfer level: Needs assistance Equipment used: Rolling walker (2 wheeled) Transfers: Sit to/from Stand Sit to Stand: Min guard         General transfer comment: cues for hand placement  Ambulation/Gait Ambulation/Gait assistance: Min guard Gait Distance (Feet): 120 Feet Assistive device: Rolling walker (2 wheeled) Gait Pattern/deviations: Step-through pattern Gait velocity: decreased Gait velocity interpretation: <1.8 ft/sec, indicate of risk for recurrent falls General Gait Details: mildly unsteady and antalgic on the right with mild drop foot and mid foot contact.   Stairs             Wheelchair Mobility    Modified Rankin (Stroke Patients Only)       Balance     Sitting balance-Leahy Scale: Good        Standing balance-Leahy Scale: Fair Standing balance comment: stood at sink and brushed his teeth without support including clean up.                            Cognition Arousal/Alertness: Awake/alert Behavior During Therapy: WFL for tasks assessed/performed Overall Cognitive Status: Within Functional Limits for tasks assessed                                        Exercises Other Exercises Other Exercises: hip/knee flexion stretch/AAROM with resisted extension x10    General Comments        Pertinent Vitals/Pain Pain Assessment: Faces Faces Pain Scale: Hurts whole lot Pain Location: RLE Pain Descriptors / Indicators: Grimacing;Sore Pain Intervention(s): Monitored during session    Home Living                      Prior Function            PT Goals (current goals can now be found in the care plan section) Acute Rehab PT Goals Patient Stated Goal: I want to use my R leg better and get back home. PT Goal Formulation: With patient Time For Goal Achievement: 11/11/19 Potential to Achieve Goals: Good Progress towards PT goals: Progressing toward goals    Frequency    Min 3X/week  PT Plan Current plan remains appropriate    Co-evaluation              AM-PAC PT "6 Clicks" Mobility   Outcome Measure  Help needed turning from your back to your side while in a flat bed without using bedrails?: A Little Help needed moving from lying on your back to sitting on the side of a flat bed without using bedrails?: A Little Help needed moving to and from a bed to a chair (including a wheelchair)?: A Little Help needed standing up from a chair using your arms (e.g., wheelchair or bedside chair)?: A Little Help needed to walk in hospital room?: A Little Help needed climbing 3-5 steps with a railing? : A Lot 6 Click Score: 17    End of Session   Activity Tolerance: Patient tolerated treatment well;Patient limited by  pain Patient left: in bed;with call bell/phone within reach Nurse Communication: Mobility status PT Visit Diagnosis: Difficulty in walking, not elsewhere classified (R26.2)     Time: 2217-9810 PT Time Calculation (min) (ACUTE ONLY): 15 min  Charges:  $Gait Training: 8-22 mins                     10/30/2019  Danny Carne., PT Acute Rehabilitation Services [phone]  (pager) [phone]  (office)   [name] 10/30/2019, 6:28 PM

## 2019-10-30 NOTE — Progress Notes (Addendum)
Patient returned from IR at 1834hrs. HD catheter in place RIJ, clean dry and intact.  Diet ordered.  HD notified patient now has catheter, advised he would get HD on next shift.  Will monitor VS q30 x4.

## 2019-10-30 NOTE — Procedures (Signed)
Interventional Radiology Procedure Note  Procedure: Placement of a right IJ approach tunneled HD catheter.  Tip is positioned at the superior cavoatrial junction and catheter is ready for immediate use.  Complications: None Recommendations:  - Ok to use - Do not submerge - Routine line care   Signed,  Dulcy Fanny. Earleen Newport, DO

## 2019-10-30 NOTE — Progress Notes (Signed)
PROGRESS NOTE    [name]  QAS:[id number] DOB: 04/23/1970 DOA: 10/27/2019 PCP: Lavone Orn, MD   Brief Narrative:  HPI from Dr Rayann Heman [name] is a 49 y.o. male with medical history significant for Crohn's disease, PE not currently on anticoagulation, and depression who presents to the ED for evaluation of right upper and lower extremity weakness and pain. Patient stated he was out drinking last night (10/26/2019) when he got intoxicated on alcohol, Molly, cocaine.  He blacked out and did not recall the events occurring later that night.  He apparently fell onto a brick floor kitchen at his friend's residence landing on the right side of his body and the right side of his face.  He woke up, unable to move his right arm or leg and had significant pain throughout his right upper and lower extremities.  He was brought to the ED by his significant other.  ED Course:  Initial vitals showed BP 63/40, pulse 115, RR 29, temp 97.7 Fahrenheit, SPO2 95% on room air. Labs significant for potassium >7.5, BUN 31, creatinine 5.26, bicarb 14, ALT 1851, anion gap 24, WBC 43.5, hemoglobin 19.3, hematocrit 56.4, platelets 276,000, serum ethanol elevated at 24, lactic acid 6.8, CK >50,000, UDS is positive for cocaine. CT head/maxillofacial/cervical spine without contrast were negative for acute intracranial abnormality. Portable chest x-ray unremarkable Right ankle and hip x-ray is negative for acute fracture or dislocation. MRI brain shows punctate acute infarcts in the right septal lobe and right cerebellum. CT abdomen/pelvis unremarkable.  Nephrology, neurology, orthopedic surgery were consulted.  Patient admitted for further management.   Assessment & Plan:   Principal Problem:   Acute renal failure due to rhabdomyolysis [facility]) Active Problems:   Hyperkalemia   Transaminitis   High anion gap metabolic acidosis   Alcohol use   Substance use disorder   Acute cerebral infarction (HCC)    Hypotension   Crohn's disease (HCC)   Lactic acidosis   Rhabdomyolysis with acute renal failure/high anion gap metabolic acidosis Hypotensive on arrival CK >50,000--> 22,988-->50,00 Cr worsened 8.42-->11 Nephrology consulted, started on bicarb drip, due to minimal UO, plan to start RRT on 10/30/19 Plan for [facility] on 10/30/19 Plan for RRT, continue Foley Daily CMP  Hyperkalemia Resolved  Hypocalcemia Repeat 1 dose of calcium gluconate 2 g Daily CMP  Transaminitis AST 4337, ALT 1851 on admission, will trend Multifactorial from rhabdomyolysis, hypoperfusion, alcohol use Normal acetaminophen level Daily CMP  Leukocytosis Afebrile, no signs of infection Likely reactive from rhabdomyolysis in addition to hemoconcentration  Right upper and lower extremity weakness/edema/pain/acute infarction of the right occipital lobe and right cerebellum/watershed stroke/globi pallidi MRI brain shows punctate acute infarcts in the right septal lobe and right cerebellum Neurology consulted, right-sided weakness likely peripheral in nature (nerve palsy), may need outpatient EMG if continues to be weak MRI cervical/lumbar spine fairly unremarkable, normal MRA of the head and neck Echo pending Orthopedics consulted for possible developing compartment syndrome, likely acute neurologic palsy, recommend outpatient EMG if no improvement PT/OT  Crohn's disease Follows with  GI, Dr. Havery Moros.  Has been on Remicade monotherapy.  Currently stable.  Hold Remicade.  Substance/alcohol abuse Denies any prior history of withdrawal UDS positive for cocaine, reports using Molly Continue CIWA, no standing Ativan at this time given multiple medical complications as above   DVT prophylaxis: heparin injection 5,000 Units Start: 10/27/19 2315   Code Status: Full Code  Family Communication: None present at bedside    Status is: Inpatient  Remains inpatient appropriate because:Inpatient level of care  appropriate due to severity of illness   Dispo: The patient is from: Home              Anticipated d/c is to: Home              Anticipated d/c date is: 3 days              Patient currently is not medically stable to d/c.     Estimated body mass index is 30.47 kg/m as calculated from the following:   Height as of this encounter: 5' 11"  (1.803 m).   Weight as of this encounter: 99.1 kg.      Nutritional status:               Consultants:   Neurology  Orthopedics  Nephrology  Procedures:   None  Antimicrobials:  Anti-infectives (From admission, onward)   None         Subjective: Patient seen and examined at bedside.  Still with right upper extremity tenderness, unable to lift left lower extremity.  Patient denies any nausea/vomiting, fever/chills, abdominal pain, chest pain, shortness of breath.  Oriented x4    Objective: Vitals:   10/30/19 0402 10/30/19 0510 10/30/19 0807 10/30/19 1214  BP: 131/70  (!) 143/74 140/75  Pulse: (!) 104  (!) 102 (!) 104  Resp: 18  20 18   Temp: 99.1 F (37.3 C)  (!) 97.3 F (36.3 C) 97.8 F (36.6 C)  TempSrc: Axillary  Oral Oral  SpO2: 95% 94% 94% 97%  Weight: 99.1 kg     Height:        Intake/Output Summary (Last 24 hours) at 10/30/2019 1507 Last data filed at 10/30/2019 1200 Gross per 24 hour  Intake 1324.68 ml  Output 150 ml  Net 1174.68 ml   Filed Weights   10/27/19 1616 10/29/19 0346 10/30/19 0402  Weight: 95.7 kg 104.4 kg 99.1 kg    Examination:  General: NAD   Cardiovascular: S1, S2 present  Respiratory: CTAB  Abdomen: Soft, nontender, nondistended, bowel sounds present  Musculoskeletal: No bilateral pedal edema noted  Skin: Normal  Psychiatry: Normal mood  Neurology: RUE 4/5, 1/5 RLE, no other focal neurologic deficit     Data Reviewed: I have personally reviewed following labs and imaging studies  CBC: Recent Labs  Lab 10/27/19 1705 10/27/19 1705 10/27/19 2144  10/28/19 0500 10/29/19 0314 10/29/19 1811 10/30/19 0452  WBC 43.5*  --   --  25.2* 18.5* 14.9* 11.8*  NEUTROABS 37.4*  --   --   --  16.5*  --  10.2*  HGB 19.3*   < > 13.6 13.9 13.1 11.8* 10.9*  HCT 56.4*   < > 40.0 42.0 38.6* 35.9* 32.5*  MCV 91.6  --   --  94.6 91.3 90.9 90.3  PLT 276  --   --  148* 137* 125* 115*   < > = values in this interval not displayed.   Basic Metabolic Panel: Recent Labs  Lab 10/28/19 0649 10/28/19 1635 10/29/19 0314 10/29/19 1811 10/30/19 0452  NA 137 135 132* 132* 136  K 5.0 4.6 4.6 4.4 4.1  CL 106 100 97* 98 99  CO2 15* 16* 16* 15* 17*  GLUCOSE 143* 133* 114* 110* 100*  BUN 46* 52* 63* 80* 93*  CREATININE 6.06* 7.26* 8.42* 9.70* 11.09*  CALCIUM 5.9* 5.9* 5.8* 5.8* 5.4*  MG  --   --  2.2  --   --  PHOS 10.1*  --   --  11.2*  --    GFR: Estimated Creatinine Clearance: 9.8 mL/min (A) (by C-G formula based on SCr of 11.09 mg/dL (H)). Liver Function Tests: Recent Labs  Lab 10/27/19 1852 10/27/19 1852 10/28/19 0500 10/28/19 0649 10/29/19 0314 10/29/19 1811 10/30/19 0452  AST 4,337*  --  9,139*  --  1,796*  --  2,731*  ALT 1,851*  --  3,179*  --  1,133*  --  2,474*  ALKPHOS 83  --  72  --  94  --  77  BILITOT 0.7  --  0.9  --  1.3*  --  1.2  PROT 7.1  --  5.5*  --  5.0*  --  4.7*  ALBUMIN 3.9   < > 3.0* 3.1* 2.6* 2.5* 2.3*   < > = values in this interval not displayed.   No results for input(s): LIPASE, AMYLASE in the last 168 hours. No results for input(s): AMMONIA in the last 168 hours. Coagulation Profile: No results for input(s): INR, PROTIME in the last 168 hours. Cardiac Enzymes: Recent Labs  Lab 10/27/19 1852 10/28/19 0137 10/29/19 0314 10/30/19 0452  CKTOTAL >50,000* >50,000* 22,988* >50,000*   BNP (last 3 results) No results for input(s): PROBNP in the last 8760 hours. HbA1C: Recent Labs    10/28/19 0649  HGBA1C 5.1   CBG: Recent Labs  Lab 10/27/19 1600 10/27/19 2259  GLUCAP 97 174*   Lipid  Profile: Recent Labs    10/28/19 0649 10/29/19 0314  CHOL 182  --   HDL 20*  --   LDLCALC UNABLE TO CALCULATE IF TRIGLYCERIDE OVER 400 mg/dL  --   TRIG 462*  --   CHOLHDL 9.1  --   LDLDIRECT 66.0 56.7   Thyroid Function Tests: No results for input(s): TSH, T4TOTAL, FREET4, T3FREE, THYROIDAB in the last 72 hours. Anemia Panel: No results for input(s): VITAMINB12, FOLATE, FERRITIN, TIBC, IRON, RETICCTPCT in the last 72 hours. Sepsis Labs: Recent Labs  Lab 10/27/19 1950 10/27/19 2249 10/28/19 0650  LATICACIDVEN 6.8* 5.4* 3.2*    Recent Results (from the past 240 hour(s))  Culture, blood (Routine X 2) w Reflex to ID Panel     Status: None (Preliminary result)   Collection Time: 10/27/19  8:43 PM   Specimen: BLOOD  Result Value Ref Range Status   Specimen Description   Final    BLOOD LEFT ANTECUBITAL Performed at [facility], [place] [address], [place], [place] [address]    Special Requests   Final    BOTTLES DRAWN AEROBIC AND ANAEROBIC Blood Culture results may not be optimal due to an excessive volume of blood received in culture bottles Performed at [facility] [address] [place], [place] [address]    Culture   Final    NO GROWTH 2 DAYS Performed at [facility] [address] [place], [place] [address]    Report Status PENDING  Incomplete  SARS Coronavirus 2 by RT PCR (hospital order, performed in [facility] hospital lab) Nasopharyngeal Nasopharyngeal Swab     Status: None   Collection Time: 10/27/19  8:43 PM   Specimen: Nasopharyngeal Swab  Result Value Ref Range Status   SARS Coronavirus 2 NEGATIVE NEGATIVE Final    Comment: (NOTE) SARS-CoV-2 target nucleic acids are NOT DETECTED.  The SARS-CoV-2 RNA is generally detectable in upper and lower respiratory specimens during the acute phase of infection. The lowest concentration of SARS-CoV-2 viral copies  this assay can detect is 250 copies / mL. A negative result  does not preclude SARS-CoV-2 infection and should not be used as the sole basis for treatment or other patient management decisions.  A negative result may occur with improper specimen collection / handling, submission of specimen other than nasopharyngeal swab, presence of viral mutation(s) within the areas targeted by this assay, and inadequate number of viral copies (<250 copies / mL). A negative result must be combined with clinical observations, patient history, and epidemiological information.  Fact Sheet for Patients:   StrictlyIdeas.no  Fact Sheet for Healthcare Providers: BankingDealers.co.za  This test is not yet approved or  cleared by the [place] FDA and has been authorized for detection and/or diagnosis of SARS-CoV-2 by FDA under an Emergency Use Authorization (EUA).  This EUA will remain in effect (meaning this test can be used) for the duration of the COVID-19 declaration under Section 564(b)(1) of the Act, 21 U.S.C. section 360bbb-3(b)(1), unless the authorization is terminated or revoked sooner.  Performed at [facility], [place] [address], [place], [place] [address]   Culture, blood (Routine X 2) w Reflex to ID Panel     Status: None (Preliminary result)   Collection Time: 10/27/19  9:12 PM   Specimen: BLOOD  Result Value Ref Range Status   Specimen Description   Final    BLOOD RIGHT ANTECUBITAL Performed at [facility] [address], [place], [place] [address]    Special Requests   Final    BOTTLES DRAWN AEROBIC AND ANAEROBIC Blood Culture adequate volume Performed at [facility] [address] [place], [place] [address]    Culture   Final    NO GROWTH 2 DAYS Performed at [facility] [address] [place], [place] [address]    Report Status PENDING  Incomplete  MRSA PCR Screening     Status: None   Collection Time: 10/29/19  1:44 PM    Specimen: Nasopharyngeal  Result Value Ref Range Status   MRSA by PCR NEGATIVE NEGATIVE Final    Comment:        The GeneXpert MRSA Assay (FDA approved for NASAL specimens only), is one component of a comprehensive MRSA colonization surveillance program. It is not intended to diagnose MRSA infection nor to guide or monitor treatment for MRSA infections. Performed at [facility], [place] [address] [place],  [address]       Radiology Studies: MR ANGIO HEAD WO CONTRAST  Result Date: 10/29/2019 CLINICAL DATA:  Left-sided weakness EXAM: MRA HEAD WITHOUT CONTRAST MRA NECK WITHOUT CONTRAST TECHNIQUE: Angiographic images of the Circle of Willis were obtained using MRA technique without intravenous contrast. Angiographic images of the neck were obtained using MRA technique without intravenous contrast. Carotid stenosis measurements (when applicable) are obtained utilizing NASCET criteria, using the distal internal carotid diameter as the denominator. COMPARISON:  None. FINDINGS: MRA HEAD FINDINGS POSTERIOR CIRCULATION: --Vertebral arteries: Normal V4 segments. --Inferior cerebellar arteries: Normal. --Basilar artery: Normal. --Superior cerebellar arteries: Normal. --Posterior cerebral arteries: Normal. The left PCA is predominantly supplied by the posterior communicating artery. ANTERIOR CIRCULATION: --Intracranial internal carotid arteries: Normal. --Anterior cerebral arteries (ACA): Normal. Both A1 segments are present. Patent anterior communicating artery (a-comm). --Middle cerebral arteries (MCA): Normal. MRA NECK FINDINGS Normal vertebral and carotid arteries. IMPRESSION: Normal MRA of the head and neck. Electronically Signed   By: Ulyses Jarred M.D.   On: 10/29/2019 00:25   MR ANGIO NECK WO  CONTRAST  Result Date: 10/29/2019 CLINICAL DATA:  Left-sided weakness EXAM: MRA HEAD WITHOUT CONTRAST MRA NECK WITHOUT CONTRAST TECHNIQUE: Angiographic images of the Circle of Willis were  obtained using MRA technique without intravenous contrast. Angiographic images of the neck were obtained using MRA technique without intravenous contrast. Carotid stenosis measurements (when applicable) are obtained utilizing NASCET criteria, using the distal internal carotid diameter as the denominator. COMPARISON:  None. FINDINGS: MRA HEAD FINDINGS POSTERIOR CIRCULATION: --Vertebral arteries: Normal V4 segments. --Inferior cerebellar arteries: Normal. --Basilar artery: Normal. --Superior cerebellar arteries: Normal. --Posterior cerebral arteries: Normal. The left PCA is predominantly supplied by the posterior communicating artery. ANTERIOR CIRCULATION: --Intracranial internal carotid arteries: Normal. --Anterior cerebral arteries (ACA): Normal. Both A1 segments are present. Patent anterior communicating artery (a-comm). --Middle cerebral arteries (MCA): Normal. MRA NECK FINDINGS Normal vertebral and carotid arteries. IMPRESSION: Normal MRA of the head and neck. Electronically Signed   By: Ulyses Jarred M.D.   On: 10/29/2019 00:25   MR CERVICAL SPINE WO CONTRAST  Result Date: 10/29/2019 CLINICAL DATA:  Right upper and lower extremity weakness EXAM: MRI CERVICAL SPINE WITHOUT CONTRAST TECHNIQUE: Multiplanar, multisequence MR imaging of the cervical spine was performed. No intravenous contrast was administered. COMPARISON:  None. FINDINGS: Alignment: Reversal of normal cervical lordosis may be positional or due to muscle spasm. Vertebrae: No fracture, evidence of discitis, or bone lesion. Cord: Normal signal and morphology. Posterior Fossa, vertebral arteries, paraspinal tissues: Visualized posterior fossa is normal. Vertebral artery flow voids are preserved. No prevertebral soft tissue swelling. Disc levels: C1-2: Small central disc protrusion. C2-3: Normal disc space and facet joints. There is no spinal canal stenosis. No neural foraminal stenosis. C3-4: Small central disc protrusion with minimal superior  migration . Mild spinal canal stenosis. Mild left neural foraminal stenosis. C4-5: Small disc bulge with bilateral facet hypertrophy and left greater than right uncovertebral spurring. There is no spinal canal stenosis. Mild right, moderate left neural foraminal stenosis. C5-6: Normal disc space and facet joints. There is no spinal canal stenosis. No neural foraminal stenosis. C6-7: Normal disc space and facet joints. There is no spinal canal stenosis. No neural foraminal stenosis. C7-T1: Normal disc space and facet joints. There is no spinal canal stenosis. No neural foraminal stenosis. IMPRESSION: 1. Mild spinal canal stenosis at C3-4 secondary to small central disc protrusion with minimal superior migration. 2. Mild right and moderate left C4-5 neural foraminal stenosis. 3. Mild left C3-4 neural foraminal stenosis. Electronically Signed   By: Ulyses Jarred M.D.   On: 10/29/2019 00:09   MR LUMBAR SPINE WO CONTRAST  Result Date: 10/29/2019 CLINICAL DATA:  Left lower extremity weakness EXAM: MRI LUMBAR SPINE WITHOUT CONTRAST TECHNIQUE: Multiplanar, multisequence MR imaging of the lumbar spine was performed. No intravenous contrast was administered. COMPARISON:  None. FINDINGS: Segmentation:  Standard. Alignment:  Physiologic. Vertebrae:  No fracture, evidence of discitis, or bone lesion. Conus medullaris and cauda equina: Conus extends to the L1 level. Conus and cauda equina appear normal. Paraspinal and other soft tissues: Negative. Disc levels: There is no disc herniation, spinal canal stenosis or neural foraminal stenosis. There is mild facet arthrosis at L4-5 and L5-S1. IMPRESSION: 1. No disc herniation, spinal canal stenosis or neural foraminal stenosis. 2. Mild facet arthrosis at L4-5 and L5-S1. Electronically Signed   By: Ulyses Jarred M.D.   On: 10/29/2019 00:16   ECHOCARDIOGRAM COMPLETE  Result Date: 10/29/2019    ECHOCARDIOGRAM REPORT   Patient Name:   [name] Date of Exam: 10/29/2019 Medical  Rec #:  [id number]      Height:       71.0 in Accession #:    [id number]     Weight:       230.2 lb Date of Birth:  1970/05/29      BSA:          2.238 m Patient Age:    50 years       BP:           121/67 mmHg Patient Gender: M              HR:           111 bpm. Exam Location:  Inpatient Procedure: 2D Echo Indications:    Stroke I163.9  History:        Patient has no prior history of Echocardiogram examinations.  Sonographer:    Mikki Santee RDCS (AE) Referring Phys: [phone] [facility] P [place]  1. Left ventricular ejection fraction, by estimation, is 55%. The left ventricle has normal function. The left ventricle has no regional wall motion abnormalities. Left ventricular diastolic parameters were normal.  2. Right ventricular systolic function is normal. The right ventricular size is normal.  3. The mitral valve is normal in structure. No evidence of mitral valve regurgitation. No evidence of mitral stenosis.  4. The aortic valve is normal in structure. Aortic valve regurgitation is not visualized. No aortic stenosis is present.  5. The inferior vena cava is normal in size with greater than 50% respiratory variability, suggesting right atrial pressure of 3 mmHg. FINDINGS  Left Ventricle: Left ventricular ejection fraction, by estimation, is 55%. The left ventricle has normal function. The left ventricle has no regional wall motion abnormalities. The left ventricular internal cavity size was normal in size. There is no left ventricular hypertrophy. Left ventricular diastolic parameters were normal. Right Ventricle: The right ventricular size is normal. No increase in right ventricular wall thickness. Right ventricular systolic function is normal. Left Atrium: Left atrial size was normal in size. Right Atrium: Right atrial size was normal in size. Pericardium: There is no evidence of pericardial effusion. Mitral Valve: The mitral valve is normal in structure. Normal mobility of the mitral valve  leaflets. No evidence of mitral valve regurgitation. No evidence of mitral valve stenosis. Tricuspid Valve: The tricuspid valve is normal in structure. Tricuspid valve regurgitation is not demonstrated. No evidence of tricuspid stenosis. Aortic Valve: The aortic valve is normal in structure. Aortic valve regurgitation is not visualized. No aortic stenosis is present. Pulmonic Valve: The pulmonic valve was normal in structure. Pulmonic valve regurgitation is not visualized. No evidence of pulmonic stenosis. Aorta: The aortic root is normal in size and structure. Venous: The inferior vena cava is normal in size with greater than 50% respiratory variability, suggesting right atrial pressure of 3 mmHg. IAS/Shunts: No atrial level shunt detected by color flow Doppler.  LEFT VENTRICLE PLAX 2D LVIDd:         5.70 cm  Diastology LVIDs:         3.80 cm  LV e' medial:   8.38 cm/s LV PW:         1.00 cm  LV E/e' medial: 10.5 LV IVS:        1.00 cm LVOT diam:     2.30 cm LV SV:         41 LV SV Index:   18 LVOT Area:     4.15 cm  RIGHT VENTRICLE RV S prime:  17.00 cm/s LEFT ATRIUM           Index       RIGHT ATRIUM           Index LA diam:      3.20 cm 1.43 cm/m  RA Area:     10.50 cm LA Vol (A2C): 23.4 ml 10.45 ml/m RA Volume:   19.40 ml  8.67 ml/m LA Vol (A4C): 29.7 ml 13.27 ml/m  AORTIC VALVE LVOT Vmax:   78.30 cm/s LVOT Vmean:  46.600 cm/s LVOT VTI:    0.098 m  AORTA Ao Root diam: 3.50 cm MITRAL VALVE MV Area (PHT): 5.23 cm    SHUNTS MV Decel Time: 145 msec    Systemic VTI:  0.10 m MV E velocity: 87.90 cm/s  Systemic Diam: 2.30 cm MV A velocity: 64.90 cm/s MV E/A ratio:  1.35 Jenkins Rouge MD Electronically signed by Jenkins Rouge MD Signature Date/Time: 10/29/2019/2:57:37 PM    Final     Scheduled Meds: . aspirin EC  81 mg Oral Daily  . Chlorhexidine Gluconate Cloth  6 each Topical Daily  . Chlorhexidine Gluconate Cloth  6 each Topical Q0600  . heparin  5,000 Units Subcutaneous Q8H   Continuous Infusions: .  sodium chloride    . sodium chloride       LOS: 3 days     Alma Friendly, MD [facility]  10/30/2019, 3:07 PM

## 2019-10-30 NOTE — H&P (Signed)
Chief Complaint: Patient was seen in consultation today for a tunneled dialysis catheter.   Referring Physician(s): Dr. Hollie Salk   Supervising Physician: Corrie Mckusick  Patient Status: [facility] - In-pt  History of Present Illness: [name] is a 49 y.o. male with a medical history significant for crohn's colitis, depression, pulmonary embolism (post-op; not on anticoagulation) and substance abuse. The patient was brought to the ED by his wife 10/27/19 after the patient lost consciousness and fell the night prior. He had been drinking and using drugs and he was on the floor for a prolonged period of time. When he awoke the morning of 10/27/19 he was unable to move the right side of his body and he had significant pain. ED work up was positive for acute renal failure due to rhabdomyolysis including a K+ of 7.5, BUN/Cr 31/5.26 and CK >50k.   Interventional Radiology has been asked to evaluate this patient for a tunneled dialysis catheter for hemodialysis.      Past Medical History:  Diagnosis Date  . Allergy   . Crohn's colitis ([place])   . Depression   . Pulmonary embolism [facility])     Past Surgical History:  Procedure Laterality Date  . COLOSTOMY  March 2011  . Colostomy reversal  January 2012  . HIP SURGERY  R hip surgery     Allergies: Patient has no known allergies.  Medications: Prior to Admission medications   Medication Sig Start Date End Date Taking? Authorizing Provider  acetaminophen (TYLENOL) 500 MG tablet Take 1,500 mg by mouth 2 (two) times daily as needed for mild pain or moderate pain.    Yes [provider]  inFLIXimab (REMICADE) 100 MG injection Inject 400 mg into the vein every 8 (eight) weeks.   Yes [provider]  loratadine (CLARITIN) 10 MG tablet Take 10 mg by mouth as needed for allergies.    [provider]  sildenafil (VIAGRA) 50 MG tablet Take 50 mg by mouth daily as needed for erectile dysfunction.    [provider]      Family History  Problem Relation Age of Onset  . Colon polyps Father   . Multiple sclerosis Sister   . Colon cancer Neg Hx   . Esophageal cancer Neg Hx   . Stomach cancer Neg Hx   . Pancreatic cancer Neg Hx   . Liver disease Neg Hx     Social History   Socioeconomic History  . Marital status: Married    Spouse name: Not on file  . Number of children: 1  . Years of education: Not on file  . Highest education level: Not on file  Occupational History  . Not on file  Tobacco Use  . Smoking status: Current Every Day Smoker    Packs/day: 0.50    Last attempt to quit: 07/29/2016    Years since quitting: 3.2  . Smokeless tobacco: Never Used  Substance and Sexual Activity  . Alcohol use: Yes    Comment: 1 per day  . Drug use: No  . Sexual activity: Yes  Other Topics Concern  . Not on file  Social History Narrative  . Not on file   Social Determinants of Health   Financial Resource Strain:   . Difficulty of Paying Living Expenses: Not on file  Food Insecurity:   . Worried About Charity fundraiser in the Last Year: Not on file  . Ran Out of Food in the Last Year: Not on file  Transportation Needs:   . Film/video editor (Medical): Not on file  . Lack of Transportation (Non-Medical): Not on file  Physical Activity:   . Days of Exercise per Week: Not on file  . Minutes of Exercise per Session: Not on file  Stress:   . Feeling of Stress : Not on file  Social Connections:   . Frequency of Communication with Friends and Family: Not on file  . Frequency of Social Gatherings with Friends and Family: Not on file  . Attends Religious Services: Not on file  . Active Member of Clubs or Organizations: Not on file  . Attends Archivist Meetings: Not on file  . Marital Status: Not on file    Review of Systems: A 12 point ROS discussed and pertinent positives are indicated in the HPI above.  All other systems are negative.  Review of Systems  Constitutional:  Positive for appetite change and fatigue.  Respiratory: Negative for cough and shortness of breath.   Cardiovascular: Positive for leg swelling. Negative for chest pain.       Right leg   Gastrointestinal: Negative for abdominal pain, diarrhea, nausea and vomiting.  Genitourinary: Positive for decreased urine volume.  Musculoskeletal: Negative for back pain.  Neurological: Negative for headaches.    Vital Signs: BP (!) 143/74 (BP Location: Right Arm)   Pulse (!) 102   Temp (!) 97.3 F (36.3 C) (Oral)   Resp 20   Ht 5' 11"  (1.803 m)   Wt 218 lb 7.6 oz (99.1 kg)   SpO2 94%   BMI 30.47 kg/m   Physical Exam Constitutional:      General: He is not in acute distress.    Appearance: He is ill-appearing.  HENT:     Head:     Comments: Right face edema/abrasion.     Mouth/Throat:     Mouth: Mucous membranes are dry.     Pharynx: Oropharynx is clear.  Cardiovascular:     Rate and Rhythm: Regular rhythm. Tachycardia present.     Pulses: Normal pulses.     Heart sounds: Normal heart sounds.  Pulmonary:     Effort: Pulmonary effort is normal.     Breath sounds: Normal breath sounds.  Abdominal:     General: Bowel sounds are normal.     Palpations: Abdomen is soft.  Musculoskeletal:        General: Swelling, tenderness and signs of injury present.     Comments: Right arm/leg pain and swelling.   Skin:    General: Skin is warm and dry.  Neurological:     Mental Status: He is alert and oriented to person, place, and time.     Imaging: CT Abdomen Pelvis Wo Contrast  Result Date: 10/27/2019 CLINICAL DATA:  Abdominal distension and pain EXAM: CT ABDOMEN AND PELVIS WITHOUT CONTRAST TECHNIQUE: Multidetector CT imaging of the abdomen and pelvis was performed following the standard protocol without IV contrast. COMPARISON:  None. FINDINGS: Lower chest: The visualized heart size within normal limits. No pericardial fluid/thickening. No hiatal hernia. Mild streaky patchy airspace opacity  seen at the right lung base. Hepatobiliary: Although limited due to the lack of intravenous contrast, normal in appearance without gross focal abnormality. No evidence of calcified gallstones or biliary ductal dilatation. Pancreas:  Unremarkable.  No surrounding inflammatory changes. Spleen: Normal in size. Although limited due to the lack of intravenous contrast, normal in appearance. Adrenals/Urinary Tract: Both adrenal glands appear normal. The kidneys and collecting system appear normal  without evidence of urinary tract calculus or hydronephrosis. Bladder is unremarkable. Stomach/Bowel: The stomach, small bowel, are normal in appearance. There is a small anterior fat and tiny portion of transverse colon containing hernia seen along the anterior abdominal wall. A surgical anastomosis seen within the left distal colon. No areas of focal wall thickening or bowel dilatation are seen. There is a moderate amount of colonic stool present. Vascular/Lymphatic: There are no enlarged abdominal or pelvic lymph nodes. Scattered mild aortic atherosclerosis is seen. Reproductive: The prostate is unremarkable. Other: No evidence of abdominal wall mass or hernia. Musculoskeletal: No acute or significant osseous findings. Heterotopic ossifications are seen around the right hip. IMPRESSION: Small portion of transverse colon and fat containing anterior umbilical hernia, however no definite evidence bowel obstruction or strangulation. Moderate amount of colonic stool. Patchy streaky airspace opacity at the right lung base which may be due to atelectasis and/or infectious etiology. Aortic Atherosclerosis (ICD10-I70.0). Electronically Signed   By: Prudencio Pair M.D.   On: 10/27/2019 20:50   DG Ankle 2 Views Right  Result Date: 10/27/2019 CLINICAL DATA:  Trauma, fall.  Right ankle pain. EXAM: RIGHT ANKLE - 2 VIEW COMPARISON:  None. FINDINGS: No acute fracture or dislocation. There is slight lateral talar tilt. No mortise widening. No  ankle joint effusion. No focal soft tissue abnormality. IMPRESSION: No acute fracture or dislocation. Slight lateral talar tilt. Electronically Signed   By: Keith Rake M.D.   On: 10/27/2019 16:59   CT Head Wo Contrast  Result Date: 10/27/2019 CLINICAL DATA:  Post injury, syncopal episode and fall. EXAM: CT HEAD WITHOUT CONTRAST CT MAXILLOFACIAL WITHOUT CONTRAST CT CERVICAL SPINE WITHOUT CONTRAST TECHNIQUE: Multidetector CT imaging of the head, cervical spine, and maxillofacial structures were performed using the standard protocol without intravenous contrast. Multiplanar CT image reconstructions of the cervical spine and maxillofacial structures were also generated. COMPARISON:  None. FINDINGS: CT HEAD FINDINGS Brain: No evidence of acute infarction, hemorrhage, hydrocephalus, extra-axial collection or mass lesion/mass effect. Vascular: No hyperdense vessel or unexpected calcification. Skull: Normal. Negative for fracture or focal lesion. Other: None. CT MAXILLOFACIAL FINDINGS Osseous: No fracture or mandibular dislocation. No destructive process. Orbits: Negative. No traumatic or inflammatory finding. Sinuses: Clear. Soft tissues: Negative. CT CERVICAL SPINE FINDINGS Alignment: Normal. Skull base and vertebrae: No acute fracture. No primary bone lesion or focal pathologic process. Soft tissues and spinal canal: No prevertebral fluid or swelling. No visible canal hematoma. Disc levels:  Mild osteoarthritic changes at C2-C3, C3-C4 and C4-C5. Upper chest: Negative. Other: None. IMPRESSION: 1. No acute intracranial abnormality. 2. No evidence of acute traumatic injury to the cervical spine. 3. No evidence of facial fractures. 4. Mild osteoarthritic changes of the cervical spine. Electronically Signed   By: Fidela Salisbury M.D.   On: 10/27/2019 16:51   CT Cervical Spine Wo Contrast  Result Date: 10/27/2019 CLINICAL DATA:  Post injury, syncopal episode and fall. EXAM: CT HEAD WITHOUT CONTRAST CT  MAXILLOFACIAL WITHOUT CONTRAST CT CERVICAL SPINE WITHOUT CONTRAST TECHNIQUE: Multidetector CT imaging of the head, cervical spine, and maxillofacial structures were performed using the standard protocol without intravenous contrast. Multiplanar CT image reconstructions of the cervical spine and maxillofacial structures were also generated. COMPARISON:  None. FINDINGS: CT HEAD FINDINGS Brain: No evidence of acute infarction, hemorrhage, hydrocephalus, extra-axial collection or mass lesion/mass effect. Vascular: No hyperdense vessel or unexpected calcification. Skull: Normal. Negative for fracture or focal lesion. Other: None. CT MAXILLOFACIAL FINDINGS Osseous: No fracture or mandibular dislocation. No destructive process. Orbits: Negative.  No traumatic or inflammatory finding. Sinuses: Clear. Soft tissues: Negative. CT CERVICAL SPINE FINDINGS Alignment: Normal. Skull base and vertebrae: No acute fracture. No primary bone lesion or focal pathologic process. Soft tissues and spinal canal: No prevertebral fluid or swelling. No visible canal hematoma. Disc levels:  Mild osteoarthritic changes at C2-C3, C3-C4 and C4-C5. Upper chest: Negative. Other: None. IMPRESSION: 1. No acute intracranial abnormality. 2. No evidence of acute traumatic injury to the cervical spine. 3. No evidence of facial fractures. 4. Mild osteoarthritic changes of the cervical spine. Electronically Signed   By: Fidela Salisbury M.D.   On: 10/27/2019 16:51   MR ANGIO HEAD WO CONTRAST  Result Date: 10/29/2019 CLINICAL DATA:  Left-sided weakness EXAM: MRA HEAD WITHOUT CONTRAST MRA NECK WITHOUT CONTRAST TECHNIQUE: Angiographic images of the Circle of Willis were obtained using MRA technique without intravenous contrast. Angiographic images of the neck were obtained using MRA technique without intravenous contrast. Carotid stenosis measurements (when applicable) are obtained utilizing NASCET criteria, using the distal internal carotid diameter as  the denominator. COMPARISON:  None. FINDINGS: MRA HEAD FINDINGS POSTERIOR CIRCULATION: --Vertebral arteries: Normal V4 segments. --Inferior cerebellar arteries: Normal. --Basilar artery: Normal. --Superior cerebellar arteries: Normal. --Posterior cerebral arteries: Normal. The left PCA is predominantly supplied by the posterior communicating artery. ANTERIOR CIRCULATION: --Intracranial internal carotid arteries: Normal. --Anterior cerebral arteries (ACA): Normal. Both A1 segments are present. Patent anterior communicating artery (a-comm). --Middle cerebral arteries (MCA): Normal. MRA NECK FINDINGS Normal vertebral and carotid arteries. IMPRESSION: Normal MRA of the head and neck. Electronically Signed   By: Ulyses Jarred M.D.   On: 10/29/2019 00:25   MR ANGIO NECK WO CONTRAST  Result Date: 10/29/2019 CLINICAL DATA:  Left-sided weakness EXAM: MRA HEAD WITHOUT CONTRAST MRA NECK WITHOUT CONTRAST TECHNIQUE: Angiographic images of the Circle of Willis were obtained using MRA technique without intravenous contrast. Angiographic images of the neck were obtained using MRA technique without intravenous contrast. Carotid stenosis measurements (when applicable) are obtained utilizing NASCET criteria, using the distal internal carotid diameter as the denominator. COMPARISON:  None. FINDINGS: MRA HEAD FINDINGS POSTERIOR CIRCULATION: --Vertebral arteries: Normal V4 segments. --Inferior cerebellar arteries: Normal. --Basilar artery: Normal. --Superior cerebellar arteries: Normal. --Posterior cerebral arteries: Normal. The left PCA is predominantly supplied by the posterior communicating artery. ANTERIOR CIRCULATION: --Intracranial internal carotid arteries: Normal. --Anterior cerebral arteries (ACA): Normal. Both A1 segments are present. Patent anterior communicating artery (a-comm). --Middle cerebral arteries (MCA): Normal. MRA NECK FINDINGS Normal vertebral and carotid arteries. IMPRESSION: Normal MRA of the head and neck.  Electronically Signed   By: Ulyses Jarred M.D.   On: 10/29/2019 00:25   MR Brain Wo Contrast (neuro protocol)  Result Date: 10/27/2019 CLINICAL DATA:  Syncope.  Right-sided weakness. EXAM: MRI HEAD WITHOUT CONTRAST TECHNIQUE: Multiplanar, multiecho pulse sequences of the brain and surrounding structures were obtained without intravenous contrast. COMPARISON:  Head CT 10/27/2019 FINDINGS: Brain: There is symmetric restricted diffusion and T2 hyperintensity in the globi pallidi. There are also 2 subcentimeter foci of restricted diffusion in the right occipital lobe and a single punctate focus of restricted diffusion in the right cerebellum. The brain is normal in signal elsewhere. No intracranial hemorrhage, mass, midline shift, or extra-axial fluid collection is identified. The ventricles and sulci are normal. Vascular: Major intracranial vascular flow voids are preserved. Skull and upper cervical spine: Unremarkable bone marrow signal. Sinuses/Orbits: Unremarkable orbits. Paranasal sinuses and mastoid air cells are clear. Other: None. IMPRESSION: 1. Symmetric restricted diffusion in the globi pallidi. This is most  often seen with an acute toxic or metabolic insult (including carbon monoxide poisoning and drugs of abuse) or hypoxic-ischemic injury. No hemorrhage. 2. Punctate acute infarcts in the right occipital lobe and right cerebellum. Electronically Signed   By: Logan Bores M.D.   On: 10/27/2019 19:01   MR CERVICAL SPINE WO CONTRAST  Result Date: 10/29/2019 CLINICAL DATA:  Right upper and lower extremity weakness EXAM: MRI CERVICAL SPINE WITHOUT CONTRAST TECHNIQUE: Multiplanar, multisequence MR imaging of the cervical spine was performed. No intravenous contrast was administered. COMPARISON:  None. FINDINGS: Alignment: Reversal of normal cervical lordosis may be positional or due to muscle spasm. Vertebrae: No fracture, evidence of discitis, or bone lesion. Cord: Normal signal and morphology. Posterior  Fossa, vertebral arteries, paraspinal tissues: Visualized posterior fossa is normal. Vertebral artery flow voids are preserved. No prevertebral soft tissue swelling. Disc levels: C1-2: Small central disc protrusion. C2-3: Normal disc space and facet joints. There is no spinal canal stenosis. No neural foraminal stenosis. C3-4: Small central disc protrusion with minimal superior migration . Mild spinal canal stenosis. Mild left neural foraminal stenosis. C4-5: Small disc bulge with bilateral facet hypertrophy and left greater than right uncovertebral spurring. There is no spinal canal stenosis. Mild right, moderate left neural foraminal stenosis. C5-6: Normal disc space and facet joints. There is no spinal canal stenosis. No neural foraminal stenosis. C6-7: Normal disc space and facet joints. There is no spinal canal stenosis. No neural foraminal stenosis. C7-T1: Normal disc space and facet joints. There is no spinal canal stenosis. No neural foraminal stenosis. IMPRESSION: 1. Mild spinal canal stenosis at C3-4 secondary to small central disc protrusion with minimal superior migration. 2. Mild right and moderate left C4-5 neural foraminal stenosis. 3. Mild left C3-4 neural foraminal stenosis. Electronically Signed   By: Ulyses Jarred M.D.   On: 10/29/2019 00:09   MR LUMBAR SPINE WO CONTRAST  Result Date: 10/29/2019 CLINICAL DATA:  Left lower extremity weakness EXAM: MRI LUMBAR SPINE WITHOUT CONTRAST TECHNIQUE: Multiplanar, multisequence MR imaging of the lumbar spine was performed. No intravenous contrast was administered. COMPARISON:  None. FINDINGS: Segmentation:  Standard. Alignment:  Physiologic. Vertebrae:  No fracture, evidence of discitis, or bone lesion. Conus medullaris and cauda equina: Conus extends to the L1 level. Conus and cauda equina appear normal. Paraspinal and other soft tissues: Negative. Disc levels: There is no disc herniation, spinal canal stenosis or neural foraminal stenosis. There is mild  facet arthrosis at L4-5 and L5-S1. IMPRESSION: 1. No disc herniation, spinal canal stenosis or neural foraminal stenosis. 2. Mild facet arthrosis at L4-5 and L5-S1. Electronically Signed   By: Ulyses Jarred M.D.   On: 10/29/2019 00:16   US RENAL  Result Date: 10/28/2019 CLINICAL DATA:  Acute kidney injury EXAM: RENAL / URINARY TRACT ULTRASOUND COMPLETE COMPARISON:  CT abdomen/pelvis dated 10/27/2019 FINDINGS: Right Kidney: Renal measurements: 11.5 x 6.3 x 6.8 cm = volume: 257 mL. Echogenicity within normal limits. No mass or hydronephrosis visualized. Left Kidney: Renal measurements: 10.0 x 7.0 x 6.1 cm = volume: 223 mL. Echogenicity within normal limits. No mass or hydronephrosis visualized. Bladder: Appears normal for degree of bladder distention. Other: None. IMPRESSION: Negative renal ultrasound.  No hydronephrosis. Electronically Signed   By: Julian Hy M.D.   On: 10/28/2019 08:40   DG Chest Port 1 View  Result Date: 10/27/2019 CLINICAL DATA:  Trauma.  Fall onto right side. EXAM: PORTABLE CHEST 1 VIEW COMPARISON:  Chest radiograph 03/06/2010, no interval imaging available. FINDINGS: Lung volumes are  low. Mild elevation of right hemidiaphragm. Upper normal heart size likely accentuated by technique. No evidence of pneumothorax. Streaky opacities at the right greater than left lung base typical of atelectasis. No acute osseous abnormalities are seen. Right anterior fourth rib fracture appears remote. IMPRESSION: Low lung volumes with bibasilar atelectasis. No evidence of acute traumatic injury. Electronically Signed   By: Keith Rake M.D.   On: 10/27/2019 16:55   ECHOCARDIOGRAM COMPLETE  Result Date: 10/29/2019    ECHOCARDIOGRAM REPORT   Patient Name:   RAIHAN KIMMEL Date of Exam: 10/29/2019 Medical Rec #:  [id number]      Height:       71.0 in Accession #:    [id number]     Weight:       230.2 lb Date of Birth:  16-Jun-1970      BSA:          2.238 m Patient Age:    73 years       BP:            121/67 mmHg Patient Gender: M              HR:           111 bpm. Exam Location:  Inpatient Procedure: 2D Echo Indications:    Stroke I163.9  History:        Patient has no prior history of Echocardiogram examinations.  Sonographer:    Mikki Santee RDCS (AE) Referring Phys: [phone] MCNEILL P [place]  1. Left ventricular ejection fraction, by estimation, is 55%. The left ventricle has normal function. The left ventricle has no regional wall motion abnormalities. Left ventricular diastolic parameters were normal.  2. Right ventricular systolic function is normal. The right ventricular size is normal.  3. The mitral valve is normal in structure. No evidence of mitral valve regurgitation. No evidence of mitral stenosis.  4. The aortic valve is normal in structure. Aortic valve regurgitation is not visualized. No aortic stenosis is present.  5. The inferior vena cava is normal in size with greater than 50% respiratory variability, suggesting right atrial pressure of 3 mmHg. FINDINGS  Left Ventricle: Left ventricular ejection fraction, by estimation, is 55%. The left ventricle has normal function. The left ventricle has no regional wall motion abnormalities. The left ventricular internal cavity size was normal in size. There is no left ventricular hypertrophy. Left ventricular diastolic parameters were normal. Right Ventricle: The right ventricular size is normal. No increase in right ventricular wall thickness. Right ventricular systolic function is normal. Left Atrium: Left atrial size was normal in size. Right Atrium: Right atrial size was normal in size. Pericardium: There is no evidence of pericardial effusion. Mitral Valve: The mitral valve is normal in structure. Normal mobility of the mitral valve leaflets. No evidence of mitral valve regurgitation. No evidence of mitral valve stenosis. Tricuspid Valve: The tricuspid valve is normal in structure. Tricuspid valve regurgitation is not demonstrated.  No evidence of tricuspid stenosis. Aortic Valve: The aortic valve is normal in structure. Aortic valve regurgitation is not visualized. No aortic stenosis is present. Pulmonic Valve: The pulmonic valve was normal in structure. Pulmonic valve regurgitation is not visualized. No evidence of pulmonic stenosis. Aorta: The aortic root is normal in size and structure. Venous: The inferior vena cava is normal in size with greater than 50% respiratory variability, suggesting right atrial pressure of 3 mmHg. IAS/Shunts: No atrial level shunt detected by color flow Doppler.  LEFT VENTRICLE PLAX 2D LVIDd:  5.70 cm  Diastology LVIDs:         3.80 cm  LV e' medial:   8.38 cm/s LV PW:         1.00 cm  LV E/e' medial: 10.5 LV IVS:        1.00 cm LVOT diam:     2.30 cm LV SV:         41 LV SV Index:   18 LVOT Area:     4.15 cm  RIGHT VENTRICLE RV S prime:     17.00 cm/s LEFT ATRIUM           Index       RIGHT ATRIUM           Index LA diam:      3.20 cm 1.43 cm/m  RA Area:     10.50 cm LA Vol (A2C): 23.4 ml 10.45 ml/m RA Volume:   19.40 ml  8.67 ml/m LA Vol (A4C): 29.7 ml 13.27 ml/m  AORTIC VALVE LVOT Vmax:   78.30 cm/s LVOT Vmean:  46.600 cm/s LVOT VTI:    0.098 m  AORTA Ao Root diam: 3.50 cm MITRAL VALVE MV Area (PHT): 5.23 cm    SHUNTS MV Decel Time: 145 msec    Systemic VTI:  0.10 m MV E velocity: 87.90 cm/s  Systemic Diam: 2.30 cm MV A velocity: 64.90 cm/s MV E/A ratio:  1.35 Jenkins Rouge MD Electronically signed by Jenkins Rouge MD Signature Date/Time: 10/29/2019/2:57:37 PM    Final    DG HIP UNILAT WITH PELVIS 2-3 VIEWS RIGHT  Result Date: 10/27/2019 CLINICAL DATA:  Fall onto right side.  History of right hip surgery. EXAM: DG HIP (WITH OR WITHOUT PELVIS) 2-3V RIGHT COMPARISON:  Remote radiograph 06/28/2009 FINDINGS: No evidence of acute fracture. There is heterotopic ossification about the right hip. This appears increased from prior exam. Femoral head is seated in the acetabulum. Pubic rami are intact. Pubic  symphysis and sacroiliac joints are congruent. There is slight flattening of the left femoral head neck junction that is unchanged from prior. IMPRESSION: No acute fracture or subluxation of the pelvis or right hip. Electronically Signed   By: Keith Rake M.D.   On: 10/27/2019 16:57   CT Maxillofacial WO CM  Result Date: 10/27/2019 CLINICAL DATA:  Post injury, syncopal episode and fall. EXAM: CT HEAD WITHOUT CONTRAST CT MAXILLOFACIAL WITHOUT CONTRAST CT CERVICAL SPINE WITHOUT CONTRAST TECHNIQUE: Multidetector CT imaging of the head, cervical spine, and maxillofacial structures were performed using the standard protocol without intravenous contrast. Multiplanar CT image reconstructions of the cervical spine and maxillofacial structures were also generated. COMPARISON:  None. FINDINGS: CT HEAD FINDINGS Brain: No evidence of acute infarction, hemorrhage, hydrocephalus, extra-axial collection or mass lesion/mass effect. Vascular: No hyperdense vessel or unexpected calcification. Skull: Normal. Negative for fracture or focal lesion. Other: None. CT MAXILLOFACIAL FINDINGS Osseous: No fracture or mandibular dislocation. No destructive process. Orbits: Negative. No traumatic or inflammatory finding. Sinuses: Clear. Soft tissues: Negative. CT CERVICAL SPINE FINDINGS Alignment: Normal. Skull base and vertebrae: No acute fracture. No primary bone lesion or focal pathologic process. Soft tissues and spinal canal: No prevertebral fluid or swelling. No visible canal hematoma. Disc levels:  Mild osteoarthritic changes at C2-C3, C3-C4 and C4-C5. Upper chest: Negative. Other: None. IMPRESSION: 1. No acute intracranial abnormality. 2. No evidence of acute traumatic injury to the cervical spine. 3. No evidence of facial fractures. 4. Mild osteoarthritic changes of the cervical spine. Electronically Signed   By:  Fidela Salisbury M.D.   On: 10/27/2019 16:51    Labs:  CBC: Recent Labs    10/28/19 0500 10/29/19 0314  10/29/19 1811 10/30/19 0452  WBC 25.2* 18.5* 14.9* 11.8*  HGB 13.9 13.1 11.8* 10.9*  HCT 42.0 38.6* 35.9* 32.5*  PLT 148* 137* 125* 115*    COAGS: No results for input(s): INR, APTT in the last 8760 hours.  BMP: Recent Labs    10/28/19 1635 10/29/19 0314 10/29/19 1811 10/30/19 0452  NA 135 132* 132* 136  K 4.6 4.6 4.4 4.1  CL 100 97* 98 99  CO2 16* 16* 15* 17*  GLUCOSE 133* 114* 110* 100*  BUN 52* 63* 80* 93*  CALCIUM 5.9* 5.8* 5.8* 5.4*  CREATININE 7.26* 8.42* 9.70* 11.09*  GFRNONAA 8* 7* 6* 5*  GFRAA 9* 8* 7* 6*    LIVER FUNCTION TESTS: Recent Labs    10/27/19 1852 10/27/19 1852 10/28/19 0500 10/28/19 0500 10/28/19 0649 10/29/19 0314 10/29/19 1811 10/30/19 0452  BILITOT 0.7  --  0.9  --   --  1.3*  --  1.2  AST 4,337*  --  9,139*  --   --  1,610*  --  2,731*  ALT 1,851*  --  3,179*  --   --  1,133*  --  2,474*  ALKPHOS 83  --  72  --   --  94  --  77  PROT 7.1  --  5.5*  --   --  5.0*  --  4.7*  ALBUMIN 3.9   < > 3.0*   < > 3.1* 2.6* 2.5* 2.3*   < > = values in this interval not displayed.    TUMOR MARKERS: No results for input(s): AFPTM, CEA, CA199, CHROMGRNA in the last 8760 hours.  Assessment and Plan:   Acute renal failure secondary to rhabdomyolysis: [name]. 53, 49 year old male, is scheduled to be seen at the [facility] department for a tunneled dialysis catheter, tentatively for 10/30/19.  Risks and benefits discussed with the patient including, but not limited to bleeding, infection, vascular injury, pneumothorax which may require chest tube placement, air embolism or even death  All of the patient's questions were answered, patient is agreeable to proceed.  The patient has been NPO. Labs and vitals have been reviewed. Last dose of subcutaneous heparin was 10/29/19 at 2109.   Consent signed and in chart.  Thank you for this interesting consult.  I greatly enjoyed meeting [name] and look forward to  participating in their care.  A copy of this report was sent to the requesting provider on this date.  Electronically Signed: Soyla Dryer, AGACNP-BC [phone] 10/30/2019, 11:10 AM   I spent a total of 20 Minutes    in face to face in clinical consultation, greater than 50% of which was counseling/coordinating care for tunneled dialysis catheter placement.

## 2019-10-30 NOTE — Progress Notes (Signed)
[facility] Progress Note  Subjective: Cr continues to rise.    Vitals:   10/30/19 0402 10/30/19 0510 10/30/19 0807 10/30/19 1214  BP: 131/70  (!) 143/74 140/75  Pulse: (!) 104  (!) 102 (!) 104  Resp: 18  20 18   Temp: 99.1 F (37.3 C)  (!) 97.3 F (36.3 C) 97.8 F (36.6 C)  TempSrc: Axillary  Oral Oral  SpO2: 95% 94% 94% 97%  Weight: 99.1 kg     Height:        Exam: Gen NAD No jvd or bruits Chest clear bilat to bases RRR no MRG Abd soft ntnd no mass or ascites R leg and arm w/ tense edema, no edema on L side, R cheek abrasion Ext no wounds or ulcers Neuro is alert, Ox 3 , nf    Home meds:  - remicade 400 IV every 8 wks  - ciagra 50 prn/ claritin 10 prn  - prn's/ vitamins/ supplements     UDS + cocaine     CT abd 9/04 > Adrenals/Urinary Tract: Both adrenal glands appear normal. The kidneys and collecting system appear normal without evidence of urinary tract calculus or hydronephrosis. Bladder is unremarkable. Otherwise >> IMPRESSION: small portion of transverse colon and fat containing anterior umbilical hernia, however no definite evidence bowel obstruction or strangulation. Moderate amount of colonic stool. Patchy streaky airspace opacity at the right lung base which may be due to atelectasis and/or infectious etiology.     MRI brain 9/04 > IMPRESSION: 1. Symmetric restricted diffusion in the globi pallidi. This is most often seen with an acute toxic or metabolic insult (including carbon monoxide poisoning and drugs of abuse) or hypoxic-ischemic injury. No hemorrhage. 2. Punctate acute infarcts in the R occipital lobe and R cerebellum.    CXR 10/27/19 - IMPRESSION: Low lung volumes with bibasilar atelectasis. No evidence of acute traumatic injury         Assessment/ Plan: 1. AKI - suspect rhabdomyolysis w/ swollen R leg/ arm, AKI and high K+ in pt who was unconscious on the ground for too long of a time. CPK > 50K.  AKI due to hypotension/ hypovolemia  and rhabdomyolysis. Had 4L bolus in ED on 9/3. Cr up to mid 8s--> 11.1 today, K better, but will need RRT.  Have d/w pt and wife.  Will make NPO for [facility], HD after line placement today #1. 2. Hyperkalemia - K+ 7's on admit > 5.0--> 4.1.  Continue to follow closely 3. Drug abuse 4. H/o Crohn's - hx colostomy 2011, reversal in 2012, on [place] 10/30/2019, 12:26 PM   Recent Labs  Lab 10/28/19 0649 10/28/19 1635 10/29/19 1811 10/30/19 0452  K 5.0   < > 4.4 4.1  BUN 46*   < > 80* 93*  CREATININE 6.06*   < > 9.70* 11.09*  CALCIUM 5.9*   < > 5.8* 5.4*  PHOS 10.1*  --  11.2*  --   HGB  --    < > 11.8* 10.9*   < > = values in this interval not displayed.   Inpatient medications: . aspirin EC  81 mg Oral Daily  . Chlorhexidine Gluconate Cloth  6 each Topical Daily  . Chlorhexidine Gluconate Cloth  6 each Topical Q0600  . heparin  5,000 Units Subcutaneous Q8H   . sodium chloride    . sodium chloride    . lactated ringers Stopped (10/30/19 1129)   sodium chloride, sodium  chloride, alteplase, heparin, HYDROmorphone (DILAUDID) injection, lidocaine (PF), lidocaine-prilocaine, ondansetron **OR** ondansetron (ZOFRAN) IV, pentafluoroprop-tetrafluoroeth, senna-docusate

## 2019-10-30 NOTE — Progress Notes (Signed)
CRITICAL VALUE STICKER  CRITICAL VALUE: Calcium 5.4  RECEIVER (on-site recipient of call): Abundio Miu, [place] NOTIFIED:  10/30/2019 @ [phone]  MESSENGER (representative from lab): Regino Bellow  MD NOTIFIED: A. Zierle-Ghosh, DO  TIME OF NOTIFICATION: [phone]  RESPONSE: paged DO on call, no response as of yet; will pass on to AM RN

## 2019-10-30 NOTE — Progress Notes (Signed)
Report given to [facility], RN and [place], [place]. Patient is drowsy though oriented x4 resting in bed with call light in reach. Foley WNL but little output noted. ST on tele.  Patient is currently NPO for [facility] placement today. He will go to HD after catheter placed.  WBC 11.8, Hgb 10.9, platelets 115, Na+ 136, K+ 4.1 Creat 11.09, Ca2+ 5.4, AST 2731, ALT 2474 this AM. Neuro and nephro following.  Another dose of IV calcium gluconate ordered. SubQ Heparin held this AM for procedure. Plan is to go home with home health/PT vs rehab when stable.

## 2019-10-31 LAB — CBC WITH DIFFERENTIAL/PLATELET
Abs Immature Granulocytes: 0.29 10*3/uL — ABNORMAL HIGH (ref 0.00–0.07)
Basophils Absolute: 0.1 10*3/uL (ref 0.0–0.1)
Basophils Relative: 0 %
Eosinophils Absolute: 0.1 10*3/uL (ref 0.0–0.5)
Eosinophils Relative: 1 %
HCT: 33.8 % — ABNORMAL LOW (ref 39.0–52.0)
Hemoglobin: 11.1 g/dL — ABNORMAL LOW (ref 13.0–17.0)
Immature Granulocytes: 2 %
Lymphocytes Relative: 10 %
Lymphs Abs: 1.3 10*3/uL (ref 0.7–4.0)
MCH: 29.8 pg (ref 26.0–34.0)
MCHC: 32.8 g/dL (ref 30.0–36.0)
MCV: 90.9 fL (ref 80.0–100.0)
Monocytes Absolute: 0.6 10*3/uL (ref 0.1–1.0)
Monocytes Relative: 5 %
Neutro Abs: 10.1 10*3/uL — ABNORMAL HIGH (ref 1.7–7.7)
Neutrophils Relative %: 82 %
Platelets: 126 10*3/uL — ABNORMAL LOW (ref 150–400)
RBC: 3.72 MIL/uL — ABNORMAL LOW (ref 4.22–5.81)
RDW: 13.2 % (ref 11.5–15.5)
WBC: 12.4 10*3/uL — ABNORMAL HIGH (ref 4.0–10.5)
nRBC: 0 % (ref 0.0–0.2)

## 2019-10-31 LAB — COMPREHENSIVE METABOLIC PANEL
ALT: 1547 U/L — ABNORMAL HIGH (ref 0–44)
AST: 1292 U/L — ABNORMAL HIGH (ref 15–41)
Albumin: 2.5 g/dL — ABNORMAL LOW (ref 3.5–5.0)
Alkaline Phosphatase: 74 U/L (ref 38–126)
Anion gap: 22 — ABNORMAL HIGH (ref 5–15)
BUN: 130 mg/dL — ABNORMAL HIGH (ref 6–20)
CO2: 16 mmol/L — ABNORMAL LOW (ref 22–32)
Calcium: 4.6 mg/dL — CL (ref 8.9–10.3)
Chloride: 95 mmol/L — ABNORMAL LOW (ref 98–111)
Creatinine, Ser: 13.86 mg/dL — ABNORMAL HIGH (ref 0.61–1.24)
GFR calc Af Amer: 4 mL/min — ABNORMAL LOW (ref >60)
GFR calc non Af Amer: 4 mL/min — ABNORMAL LOW (ref >60)
Glucose, Bld: 97 mg/dL (ref 70–99)
Potassium: 4 mmol/L (ref 3.5–5.1)
Sodium: 133 mmol/L — ABNORMAL LOW (ref 135–145)
Total Bilirubin: 0.9 mg/dL (ref 0.3–1.2)
Total Protein: 5.2 g/dL — ABNORMAL LOW (ref 6.5–8.1)

## 2019-10-31 LAB — CK: Total CK: >50000 U/L — ABNORMAL HIGH (ref 49–397)

## 2019-10-31 MED ORDER — CALCIUM GLUCONATE-NACL 2-0.675 GM/100ML-% IV SOLN
2.0000 g | Freq: Once | INTRAVENOUS | Status: AC
  Administered 2019-10-31: 2000 mg via INTRAVENOUS
  Filled 2019-10-31: qty 100

## 2019-10-31 MED ORDER — HEPARIN SODIUM (PORCINE) 1000 UNIT/ML IJ SOLN
INTRAMUSCULAR | Status: AC
  Administered 2019-10-31: 3200 [IU] via INTRAVENOUS_CENTRAL
  Filled 2019-10-31: qty 3

## 2019-10-31 NOTE — Progress Notes (Signed)
Report given to [place], Therapist, sports. Patient was alert and oriented x4 overnight, drowsiness still present. ST on tele. VSS.  Patient currently in hemodialysis. Neuro and nephro still following.  Plan is to go home with home health/PT vs rehab when stable. Patient ambulating well with stanbdy assist and front wheel walker.  Hgb 11.1, platelets 126 this AM. Patient to continue working with PT.

## 2019-10-31 NOTE — TOC Initial Note (Signed)
Transition of Care [facility]) - Initial/Assessment Note    Patient Details  Name: [name] MRN: [id number] Date of Birth: [birth year]  [facility] CM/SW Contact:    Ninfa Meeker, RN Phone Number: 10/31/2019, 4:45 PM  Clinical Narrative:   Patient is 49 yr old male admitted with acute renal failure due to [facility]. Patient was found down at home. Had first HD treatment today. Case manager spoke with patient's wife Amy, to discuss need for Home Health. Discussed [place] agencies, referral was called to Adela Lank, [facility] Liaison. Amy states that she works, but they will have support of her family and patient's parents at discharge. TOC Team will continue to monitor.                Expected Discharge Plan: [place] Barriers to Discharge: Continued Medical Work up   Patient Goals and CMS Choice Patient states their goals for this hospitalization and ongoing recovery are:: wife wants patient to get better   Choice offered to / list presented to : Spouse  Expected Discharge Plan and Services Expected Discharge Plan: [place]   Discharge Planning Services: CM Consult Post Acute Care Choice: Home Health, Durable Medical Equipment Living arrangements for the past 2 months: [place]                 DME Arranged: 3-N-1, Walker rolling DME Agency: [employer] Date DME Agency Contacted: 10/31/19 Time DME Agency Contacted: [phone] Representative spoke with at DME Agency: [place]: PT [place]: [place] Date [place]: 10/31/19 Time HH Agency Contacted: 14 Representative spoke with at [place]: Adela Lank  Prior Living Arrangements/Services Living arrangements for the past 2 months: [place] with:: Spouse Patient language and need for interpreter reviewed:: Yes        Need for Family Participation in Patient Care: Yes (Comment) Care giver support system in  place?: Yes (comment) (lives with wife, has retired parents nearby)   Architect Involvement Pertinent to Current Situation/Hospitalization: No - Comment as needed  Activities of Daily Living Home Assistive Devices/Equipment: None ADL Screening (condition at time of admission) Patient's cognitive ability adequate to safely complete daily activities?: No Is the patient deaf or have difficulty hearing?: No Does the patient have difficulty seeing, even when wearing glasses/contacts?: No Does the patient have difficulty concentrating, remembering, or making decisions?: No Patient able to express need for assistance with ADLs?: Yes Does the patient have difficulty dressing or bathing?: Yes Independently performs ADLs?: No Communication: Independent Dressing (OT): Needs assistance Is this a change from baseline?: Change from baseline, expected to last >3 days Grooming: Needs assistance Is this a change from baseline?: Change from baseline, expected to last >3 days Feeding: Needs assistance Is this a change from baseline?: Change from baseline, expected to last >3 days Bathing: Needs assistance Is this a change from baseline?: Change from baseline, expected to last >3 days Toileting: Needs assistance Is this a change from baseline?: Change from baseline, expected to last >3days In/Out Bed: Needs assistance Is this a change from baseline?: Change from baseline, expected to last >3 days Walks in Home: Independent (prior to [place]) Does the patient have difficulty walking or climbing stairs?: Yes Weakness of Legs: Right Weakness of Arms/Hands: Right  Permission Sought/Granted Permission sought to share information with : Case Manager       Permission granted to share info  w AGENCY: [facility]        Emotional Assessment       Orientation: : Oriented to Self, Oriented to Place, Oriented to  Time, Oriented to Situation   Psych Involvement: No  (comment)  Admission diagnosis:  Fall [W19.XXXA] Acute renal failure (ARF) (HCC) [N17.9] AKI (acute kidney injury) ([place]) [N17.9] Acute kidney injury ([place]) [N17.9] Patient Active Problem List   Diagnosis Date Noted  . Acute renal failure due to rhabdomyolysis ([place]) 10/27/2019  . Hyperkalemia 10/27/2019  . Transaminitis 10/27/2019  . High anion gap metabolic acidosis 93/73/4287  . Alcohol use 10/27/2019  . Substance use disorder 10/27/2019  . Acute cerebral infarction ([place]) 10/27/2019  . Hypotension 10/27/2019  . Crohn's disease ([place]) 10/27/2019  . Lactic acidosis 10/27/2019   PCP:  Lavone Orn, MD Pharmacy:   [facility] [address] - [place], [place]. [place] [address] Phone: [phone] Fax: [phone]  [id number] [place] [address] [address]Phone: [phone]: [phone]    Social Determinants of Health (SDOH) Interventions    Readmission Risk Interventions No flowsheet data found.

## 2019-10-31 NOTE — Progress Notes (Signed)
[facility] Progress Note  Subjective: s/p [facility] and HD #1 today 9/8.  Tolerated well.  Ca 4.9, placed on 3.5 Ca bath on HD and 2 g Ca gluc ordered for afterwards.    Vitals:   10/31/19 0900 10/31/19 0929 10/31/19 1029 10/31/19 1132  BP: (!) 153/74 (!) 159/68 (!) 142/90 131/80  Pulse: 85 90 95 95  Resp: 19 17 18 18   Temp:  97.6 F (36.4 C) 98.1 F (36.7 C) 99 F (37.2 C)  TempSrc:  Oral Oral Oral  SpO2:  97% 97% 97%  Weight:  103.7 kg    Height:        Exam: Gen NAD No jvd or bruits Chest clear bilat to bases RRR no MRG Abd soft ntnd no mass or ascites R leg and arm w/ edema edema, no edema on L side, R cheek abrasion Ext no wounds or ulcers Neuro is alert, Ox 3 , nf ACCESS: R IJ TDC    Home meds:  - remicade 400 IV every 8 wks  - ciagra 50 prn/ claritin 10 prn  - prn's/ vitamins/ supplements     UDS + cocaine     CT abd 9/04 > Adrenals/Urinary Tract: Both adrenal glands appear normal. The kidneys and collecting system appear normal without evidence of urinary tract calculus or hydronephrosis. Bladder is unremarkable. Otherwise >> IMPRESSION: small portion of transverse colon and fat containing anterior umbilical hernia, however no definite evidence bowel obstruction or strangulation. Moderate amount of colonic stool. Patchy streaky airspace opacity at the right lung base which may be due to atelectasis and/or infectious etiology.     MRI brain 9/04 > IMPRESSION: 1. Symmetric restricted diffusion in the globi pallidi. This is most often seen with an acute toxic or metabolic insult (including carbon monoxide poisoning and drugs of abuse) or hypoxic-ischemic injury. No hemorrhage. 2. Punctate acute infarcts in the R occipital lobe and R cerebellum.    CXR 10/27/19 - IMPRESSION: Low lung volumes with bibasilar atelectasis. No evidence of acute traumatic injury         Assessment/ Plan: 1. AKI - suspect rhabdomyolysis w/ swollen R leg/ arm, AKI and high K+ in  pt who was unconscious on the ground for too long of a time. CPK > 50K.  AKI due to hypotension/ hypovolemia and rhabdomyolysis. Had 4L bolus in ED on 9/3. Cr up to mid 8s--> 11.1 today, K better, but will need RRT.  Have d/w pt and wife. S/p TDC, HD #1 10/31/19.   2. Rhabdomyolysis: d/t being unconscious on the floor for too long.  CK > 50,000.  LFTs coming down 3. Hyperkalemia - K+ 7's on admit > 5.0--> 4.1.  Continue to follow closely.  Off lokelma 4. Drug abuse 5. H/o Crohn's - hx colostomy 2011, reversal in 2012, on [place] 10/31/2019, 12:41 PM   Recent Labs  Lab 10/28/19 0649 10/28/19 1635 10/29/19 1811 10/29/19 1811 10/30/19 0452 10/31/19 0634  K 5.0   < > 4.4   < > 4.1 4.0  BUN 46*   < > 80*   < > 93* 130*  CREATININE 6.06*   < > 9.70*   < > 11.09* 13.86*  CALCIUM 5.9*   < > 5.8*   < > 5.4* 4.6*  PHOS 10.1*  --  11.2*  --   --   --   HGB  --    < > 11.8*   < >  10.9* 11.1*   < > = values in this interval not displayed.   Inpatient medications: . aspirin EC  81 mg Oral Daily  . Chlorhexidine Gluconate Cloth  6 each Topical Daily  . Chlorhexidine Gluconate Cloth  6 each Topical Q0600  . heparin  5,000 Units Subcutaneous Q8H   . sodium chloride    . sodium chloride    . calcium gluconate 2,000 mg (10/31/19 1218)   sodium chloride, sodium chloride, alteplase, heparin, HYDROmorphone (DILAUDID) injection, lidocaine (PF), lidocaine-prilocaine, ondansetron **OR** ondansetron (ZOFRAN) IV, pentafluoroprop-tetrafluoroeth, senna-docusate

## 2019-10-31 NOTE — Progress Notes (Addendum)
Physical Therapy Treatment Patient Details Name: [name] MRN: [id number] DOB: Sep 02, 1970 Today's Date: 10/31/2019    History of Present Illness 49 year old male presents with decreased level of consciousness. Patient states that he took [name] just prior to arrival. Does complain of a fall,  woke this morning and had pain to the right side of his face. Also has bilateral lower extremity muscle aches, admitted with ARF and rhabdomyolysis.   Brain MRI-bilateral basal ganglia infarcts and posterior watershed infarcts on right. PMH: PE, tobacco abuse    PT Comments    Pt supine in bed on arrival sleeping soundly.  He continues to require varying assist levels and lacks strength, coordination and balance.  Pt would be an excellent candidate for aggressive inpatient based on his functional deficits.  Will inform supervising PT of need for updated recommendations at this time.   Follow Up Recommendations  CIR     Equipment Recommendations  Rolling walker with 5" wheels;3in1 (PT)    Recommendations for Other Services       Precautions / Restrictions Precautions Precautions: Fall Precaution Comments: foot drop on RLE Restrictions Weight Bearing Restrictions: No    Mobility  Bed Mobility Overal bed mobility: Needs Assistance Bed Mobility: Supine to Sit;Sit to Supine     Supine to sit: Mod assist Sit to supine: Min assist   General bed mobility comments: Pt required increased assistance to move R leg to edge of bed to rise into sitting.  Pt required decreased assistance to return back to bed with increased time and effort.  Transfers Overall transfer level: Needs assistance Equipment used: Rolling walker (2 wheeled) Transfers: Sit to/from Stand Sit to Stand: Min assist         General transfer comment: Cues for hand placement and forward weight shifting to rise into standing.  Ambulation/Gait Ambulation/Gait assistance: Min assist Gait Distance (Feet): 80  Feet Assistive device: Rolling walker (2 wheeled) Gait Pattern/deviations: Step-to pattern;Shuffle;Decreased stride length;Trunk flexed;Antalgic;Decreased dorsiflexion - right;Decreased stance time - right Gait velocity: decreased   General Gait Details: Slow buckle on R with cues knee extension in stance phase lacks dorsiflexion.   Stairs             Wheelchair Mobility    Modified Rankin (Stroke Patients Only)       Balance Overall balance assessment: Needs assistance Sitting-balance support: Feet supported;No upper extremity supported Sitting balance-Leahy Scale: Good       Standing balance-Leahy Scale: Fair                              Cognition Arousal/Alertness: Awake/alert Behavior During Therapy: Flat affect Overall Cognitive Status: Within Functional Limits for tasks assessed                                        Exercises General Exercises - Lower Extremity Ankle Circles/Pumps: AROM;PROM;Both;10 reps;Supine Quad Sets: AROM;Both;10 reps;Supine Heel Slides: AAROM;AROM;Both;10 reps;Supine    General Comments        Pertinent Vitals/Pain Pain Assessment: 0-10 Pain Score: 8  Pain Location: RLE Pain Descriptors / Indicators: Grimacing;Sore Pain Intervention(s): Monitored during session;Repositioned    Home Living                      Prior Function            PT  Goals (current goals can now be found in the care plan section) Acute Rehab PT Goals Patient Stated Goal: I want to use my R leg better and get back home. Potential to Achieve Goals: Good Progress towards PT goals: Progressing toward goals    Frequency    Min 4X/week      PT Plan Discharge plan needs to be updated    Co-evaluation              AM-PAC PT "6 Clicks" Mobility   Outcome Measure  Help needed turning from your back to your side while in a flat bed without using bedrails?: A Little Help needed moving from lying on  your back to sitting on the side of a flat bed without using bedrails?: A Lot Help needed moving to and from a bed to a chair (including a wheelchair)?: A Little Help needed standing up from a chair using your arms (e.g., wheelchair or bedside chair)?: A Little Help needed to walk in hospital room?: A Little Help needed climbing 3-5 steps with a railing? : A Lot 6 Click Score: 16    End of Session Equipment Utilized During Treatment: Gait belt Activity Tolerance: Patient tolerated treatment well;Patient limited by pain Patient left: in bed;with call bell/phone within reach;with bed alarm set Nurse Communication: Mobility status PT Visit Diagnosis: Difficulty in walking, not elsewhere classified (R26.2)     Time: 8682-5749 PT Time Calculation (min) (ACUTE ONLY): 23 min  Charges:  $Gait Training: 8-22 mins $Therapeutic Exercise: 8-22 mins                     Erasmo Leventhal , PTA Acute Rehabilitation Services Pager [phone] Office [phone]     [name] Eli Hose 10/31/2019, 5:22 PM

## 2019-10-31 NOTE — Progress Notes (Signed)
Rehab Admissions Coordinator Note:  Patient was screened by Cleatrice Burke for appropriateness for an Inpatient Acute Rehab Consult per change in recommendation by therapy after first hemodialysis treatment. .  At this time, we are recommending Inpatient Rehab consult if patient would like to be considered for admit. Please advise.  Cleatrice Burke RN MSN 10/31/2019, 5:41 PM  I can be reached at [phone].

## 2019-10-31 NOTE — Progress Notes (Signed)
Patient off unit to hemodialysis.  Report given to HD RN. VSS. Patient is alert and oriented x4. RIJ tunnel cath WNL ready to use. CHG bath completed and stand up weight charted.

## 2019-10-31 NOTE — Progress Notes (Signed)
PROGRESS NOTE    [name]  JJK:[id number] DOB: 08/31/1970 DOA: 10/27/2019 PCP: Lavone Orn, MD    Brief Narrative:  [name] a 49 y.o.malewith medical history significant forCrohn's disease, PE not currently on anticoagulation, and depression who presents to the ED for evaluation ofright upper and lower extremity weakness and pain. Patient stated he was out drinking last night (10/26/2019) when he got intoxicated on alcohol, Molly, cocaine. He blacked out and did not recall the events occurring later that night. He apparently fell onto a brick floor kitchen at his friend's residence landing on the right side of his body and the right side of his face. He woke up, unable to move his right arm or leg and had significant pain throughout his right upper and lower extremities. He was brought to the ED by hissignificant other. ED Course: Initial vitals showed BP 63/40, pulse 115, RR 29,  SPO2 95% on room air. Labs significant for potassium >7.5, BUN 31, creatinine 5.26, bicarb 14, ALT 1851, anion gap 24, WBC 43.5, hemoglobin 19.3, hematocrit 56.4, platelets 276,000, serum ethanol elevated at 24, lactic acid 6.8, CK >50,000, UDS is positive for cocaine. CT head/maxillofacial/cervical spine without contrast were negative for acute intracranial abnormality. Portable chest x-ray unremarkable Right ankle and hip x-ray is negative for acute fracture or dislocation. MRI brain shows punctate acute infarcts in the right septal lobe and right cerebellum. CT abdomen/pelvis unremarkable.  Nephrology, Neurology, orthopedic surgery were consulted.  Patient admitted for further management.  Patient underwent placement of right IJ tunnel catheter.  Patient underwent hemodialysis today.  He reports feeling slightly better, labs are improving.  He is getting replacement for electrolyte abnormalities. Neurology recommended aggressive treatment for resistant hypocalcemia,  stroke work-up is essentially  completed.  Once acute kidney injury and acute liver injury resolves Patient needs to be started on statins.  He has had a stroke likely from hypoperfusion event versus vasospasm from cocaine use.  Ortho consulted stated no concern for compartment syndrome.    Assessment & Plan:   Principal Problem:   Acute renal failure due to rhabdomyolysis [facility]) Active Problems:   Hyperkalemia   Transaminitis   High anion gap metabolic acidosis   Alcohol use   Substance use disorder   Acute cerebral infarction (HCC)   Hypotension   Crohn's disease (HCC)   Lactic acidosis   Rhabdomyolysis with acute renal failure /High anion gap metabolic acidosis/ Hypotensive on arrival. CK >50,000--> 22,988-->50,00 Continue to trend CK. Cr worsened 8.42-->13.86 Nephrology consulted, started on bicarb drip, due to minimal UO,  He underwent Hemodialysis today. Daily CMP  Hyperkalemia Resolved  Hypocalcemia Repeat 1 dose of calcium gluconate 2 g Daily CMP  Transaminitis: AST 4337, ALT 1851 on admission, trending down AST 1292 , ALT 1547  today Multifactorial from rhabdomyolysis, hypoperfusion, alcohol use Normal acetaminophen level Daily CMP  Leukocytosis >> Improving Afebrile, no signs of infection. Likely reactive from rhabdomyolysis in addition to hemoconcentration  Right upper and lower extremity weakness /edema /pain/acute infarction of the right occipital lobe and right cerebellum/watershed stroke/globi pallidi MRI brain shows punctate acute infarcts in the right septal lobe and right cerebellum. Neurology consulted, right-sided weakness likely peripheral in nature (nerve palsy), may need outpatient EMG if continues to be weak MRI cervical/lumbar spine fairly unremarkable, normal MRA of the head and neck Echo : LVEF 55% Orthopedics consulted for possible developing compartment syndrome, likely acute neurologic palsy, recommend outpatient EMG if no improvement. No concerns for compartment  syndrome. PT/OT  Right am swollen, Will order Right arm venous duplex to r/o DVT.  Crohn's disease Follows with [facility], Dr. Havery Moros.  Has been on Remicade monotherapy.  Currently stable.  Hold Remicade.  Substance/alcohol abuse. Denies any prior history of withdrawal. UDS positive for cocaine, reports using Molly Continue CIWA, no standing Ativan at this time given multiple medical complications as above    DVT prophylaxis:  Heparin Code Status: Full Family Communication: Family was at bed side. Disposition Plan: . Dispo: The patient is from: Home  Anticipated d/c is to: Home  Anticipated d/c date is: 3 days  Patient currently is not medically stable to d/c.    Consultants:   Nephrology  Procedures:  Hemodialysis. Antimicrobials:  Anti-infectives (From admission, onward)   Start     Dose/Rate Route Frequency Ordered Stop   10/30/19 1737  ceFAZolin (ANCEF) 2-4 GM/100ML-% IVPB       Note to Pharmacy: Desiree Hane   : cabinet override      10/30/19 1737 10/30/19 1740      Subjective: Patient was seen and examined at bedside.  Overnight events noted.   Patient came back from hemodialysis feels slightly better.  Denies any dizziness.   Not interested in social work consult for substance abuse.  Found to have right arm swelling.  Objective: Vitals:   10/31/19 0929 10/31/19 1029 10/31/19 1132 10/31/19 1601  BP: (!) 159/68 (!) 142/90 131/80 127/69  Pulse: 90 95 95 (!) 103  Resp: 17 18 18 20   Temp: 97.6 F (36.4 C) 98.1 F (36.7 C) 99 F (37.2 C) 98.6 F (37 C)  TempSrc: Oral Oral Oral Oral  SpO2: 97% 97% 97% 97%  Weight: 103.7 kg     Height:        Intake/Output Summary (Last 24 hours) at 10/31/2019 1730 Last data filed at 10/31/2019 0929 Gross per 24 hour  Intake 460 ml  Output 505 ml  Net -45 ml   Filed Weights   10/30/19 0402 10/31/19 0500 10/31/19 0929  Weight: 99.1 kg 99 kg 103.7 kg     Examination:  General exam: Appears calm and comfortable  Respiratory system: Clear to auscultation. Respiratory effort normal. Cardiovascular system: S1 & S2 heard, RRR. No JVD, murmurs, rubs, gallops or clicks. No pedal edema. Gastrointestinal system: Abdomen is nondistended, soft and nontender. No organomegaly or masses felt. Normal bowel sounds heard. Central nervous system: Alert and oriented. No focal neurological deficits. Extremities: Right arm and leg swelling. Skin: No rashes, lesions or ulcers Psychiatry: Judgement and insight appear normal. Mood & affect appropriate.     Data Reviewed: I have personally reviewed following labs and imaging studies  CBC: Recent Labs  Lab 10/27/19 1705 10/27/19 2144 10/28/19 0500 10/29/19 0314 10/29/19 1811 10/30/19 0452 10/31/19 0634  WBC 43.5*  --  25.2* 18.5* 14.9* 11.8* 12.4*  NEUTROABS 37.4*  --   --  16.5*  --  10.2* 10.1*  HGB 19.3*   < > 13.9 13.1 11.8* 10.9* 11.1*  HCT 56.4*   < > 42.0 38.6* 35.9* 32.5* 33.8*  MCV 91.6  --  94.6 91.3 90.9 90.3 90.9  PLT 276  --  148* 137* 125* 115* 126*   < > = values in this interval not displayed.   Basic Metabolic Panel: Recent Labs  Lab 10/28/19 0649 10/28/19 0649 10/28/19 1635 10/29/19 0314 10/29/19 1811 10/30/19 0452 10/31/19 0634  NA 137   < > 135 132* 132* 136 133*  K 5.0   < >  4.6 4.6 4.4 4.1 4.0  CL 106   < > 100 97* 98 99 95*  CO2 15*   < > 16* 16* 15* 17* 16*  GLUCOSE 143*   < > 133* 114* 110* 100* 97  BUN 46*   < > 52* 63* 80* 93* 130*  CREATININE 6.06*   < > 7.26* 8.42* 9.70* 11.09* 13.86*  CALCIUM 5.9*   < > 5.9* 5.8* 5.8* 5.4* 4.6*  MG  --   --   --  2.2  --   --   --   PHOS 10.1*  --   --   --  11.2*  --   --    < > = values in this interval not displayed.   GFR: Estimated Creatinine Clearance: 8 mL/min (A) (by C-G formula based on SCr of 13.86 mg/dL (H)). Liver Function Tests: Recent Labs  Lab 10/27/19 1852 10/27/19 1852 10/28/19 0500  10/28/19 0500 10/28/19 0649 10/29/19 0314 10/29/19 1811 10/30/19 0452 10/31/19 0634  AST 4,337*  --  9,139*  --   --  4,818*  --  2,731* 1,292*  ALT 1,851*  --  3,179*  --   --  1,133*  --  2,474* 1,547*  ALKPHOS 83  --  72  --   --  94  --  77 74  BILITOT 0.7  --  0.9  --   --  1.3*  --  1.2 0.9  PROT 7.1  --  5.5*  --   --  5.0*  --  4.7* 5.2*  ALBUMIN 3.9   < > 3.0*   < > 3.1* 2.6* 2.5* 2.3* 2.5*   < > = values in this interval not displayed.   No results for input(s): LIPASE, AMYLASE in the last 168 hours. No results for input(s): AMMONIA in the last 168 hours. Coagulation Profile: No results for input(s): INR, PROTIME in the last 168 hours. Cardiac Enzymes: Recent Labs  Lab 10/27/19 1852 10/28/19 0137 10/29/19 0314 10/30/19 0452 10/31/19 0634  CKTOTAL >50,000* >50,000* 22,988* >50,000* >50,000*   BNP (last 3 results) No results for input(s): PROBNP in the last 8760 hours. HbA1C: No results for input(s): HGBA1C in the last 72 hours. CBG: Recent Labs  Lab 10/27/19 1600 10/27/19 2259  GLUCAP 97 174*   Lipid Profile: Recent Labs    10/29/19 0314  LDLDIRECT 56.7   Thyroid Function Tests: No results for input(s): TSH, T4TOTAL, FREET4, T3FREE, THYROIDAB in the last 72 hours. Anemia Panel: No results for input(s): VITAMINB12, FOLATE, FERRITIN, TIBC, IRON, RETICCTPCT in the last 72 hours. Sepsis Labs: Recent Labs  Lab 10/27/19 1950 10/27/19 2249 10/28/19 0650  LATICACIDVEN 6.8* 5.4* 3.2*    Recent Results (from the past 240 hour(s))  Culture, blood (Routine X 2) w Reflex to ID Panel     Status: None (Preliminary result)   Collection Time: 10/27/19  8:43 PM   Specimen: BLOOD  Result Value Ref Range Status   Specimen Description   Final    BLOOD LEFT ANTECUBITAL Performed at [facility], [place] [address] [place], [place] [address]    Special Requests   Final    BOTTLES DRAWN AEROBIC AND ANAEROBIC Blood Culture results may not be  optimal due to an excessive volume of blood received in culture bottles Performed at [place] [address], [place], [place] [address]    Culture   Final    NO GROWTH 3 DAYS Performed at [facility], [place] [address], [place], [place] [address]    Report Status PENDING  Incomplete  SARS Coronavirus 2 by RT PCR (hospital order, performed in [facility] hospital lab) Nasopharyngeal Nasopharyngeal Swab     Status: None   Collection Time: 10/27/19  8:43 PM   Specimen: Nasopharyngeal Swab  Result Value Ref Range Status   SARS Coronavirus 2 NEGATIVE NEGATIVE Final    Comment: (NOTE) SARS-CoV-2 target nucleic acids are NOT DETECTED.  The SARS-CoV-2 RNA is generally detectable in upper and lower respiratory specimens during the acute phase of infection. The lowest concentration of SARS-CoV-2 viral copies this assay can detect is 250 copies / mL. A negative result does not preclude SARS-CoV-2 infection and should not be used as the sole basis for treatment or other patient management decisions.  A negative result may occur with improper specimen collection / handling, submission of specimen other than nasopharyngeal swab, presence of viral mutation(s) within the areas targeted by this assay, and inadequate number of viral copies (<250 copies / mL). A negative result must be combined with clinical observations, patient history, and epidemiological information.  Fact Sheet for Patients:   StrictlyIdeas.no  Fact Sheet for Healthcare Providers: BankingDealers.co.za  This test is not yet approved or  cleared by the [place] FDA and has been authorized for detection and/or diagnosis of SARS-CoV-2 by FDA under an Emergency Use Authorization (EUA).  This EUA will remain in effect (meaning this test can be used) for the duration of the COVID-19 declaration under Section 564(b)(1) of the Act, 21 U.S.C. section  360bbb-3(b)(1), unless the authorization is terminated or revoked sooner.  Performed at [facility], [place] [address] [place], [place] [address]   Culture, blood (Routine X 2) w Reflex to ID Panel     Status: None (Preliminary result)   Collection Time: 10/27/19  9:12 PM   Specimen: BLOOD  Result Value Ref Range Status   Specimen Description   Final    BLOOD RIGHT ANTECUBITAL Performed at [place] [address], [place], [place] [address]    Special Requests   Final    BOTTLES DRAWN AEROBIC AND ANAEROBIC Blood Culture adequate volume Performed at [place] [address], [place], [place] [address]    Culture   Final    NO GROWTH 3 DAYS Performed at [facility], [place] [address], [place], [place] [address]    Report Status PENDING  Incomplete  MRSA PCR Screening     Status: None   Collection Time: 10/29/19  1:44 PM   Specimen: Nasopharyngeal  Result Value Ref Range Status   MRSA by PCR NEGATIVE NEGATIVE Final    Comment:        The GeneXpert MRSA Assay (FDA approved for NASAL specimens only), is one component of a comprehensive MRSA colonization surveillance program. It is not intended to diagnose MRSA infection nor to guide or monitor treatment for MRSA infections. Performed at [facility], [place] [address], [place], [place] [address]          Radiology Studies: IR Fluoro Guide CV Line Right  Result Date: 10/30/2019 INDICATION: 49 year old male with a history of rhabdomyolysis, referred for tunneled hemodialysis catheter EXAM: IMAGE GUIDED PLACEMENT OF TUNNELED HEMODIALYSIS CATHETER MEDICATIONS: 2 g Ancef; The antibiotic was administered within an appropriate time interval prior to skin puncture. ANESTHESIA/SEDATION: Moderate (conscious) sedation was employed during this procedure. A total of Versed 2.0 mg and Fentanyl  100 mcg was administered intravenously. Moderate Sedation Time: 18  minutes. The patient's level of consciousness and vital signs were monitored continuously by radiology nursing throughout the procedure under my direct supervision. FLUOROSCOPY TIME:  Fluoroscopy Time: 0 minutes 6 seconds (1 mGy). COMPLICATIONS: None PROCEDURE: Informed written consent was obtained from the patient after a discussion of the risks, benefits, and alternatives to treatment. Questions regarding the procedure were encouraged and answered. The right neck and chest were prepped with chlorhexidine in a sterile fashion, and a sterile drape was applied covering the operative field. Maximum barrier sterile technique with sterile gowns and gloves were used for the procedure. A timeout was performed prior to the initiation of the procedure. Ultrasound survey was performed. Micropuncture kit was utilized to access the right internal jugular vein under direct, real-time ultrasound guidance after the overlying soft tissues were anesthetized with 1% lidocaine with epinephrine. Stab incision was made with 11 blade scalpel. Microwire was passed centrally. The microwire was then marked to measure appropriate internal catheter length. External tunneled length was estimated. A total tip to cuff length of 19 cm was selected. 035 guidewire was advanced to the level of the IVC. Skin and subcutaneous tissues of chest wall below the clavicle were generously infiltrated with 1% lidocaine for local anesthesia. A small stab incision was made with 11 blade scalpel. The selected hemodialysis catheter was tunneled in a retrograde fashion from the anterior chest wall to the venotomy incision. Serial dilation was performed and then a peel-away sheath was placed. The catheter was then placed through the peel-away sheath with tips ultimately positioned within the superior aspect of the right atrium. Final catheter positioning was confirmed and documented with a spot radiographic image. The catheter aspirates and flushes normally. The  catheter was flushed with appropriate volume heparin dwells. The catheter exit site was secured with a 0-Prolene retention suture. Gel-Foam slurry was infused into the soft tissue tract. The venotomy incision was closed Derma bond and sterile dressing. Dressings were applied at the chest wall. Patient tolerated the procedure well and remained hemodynamically stable throughout. No complications were encountered and no significant blood loss encountered. IMPRESSION: Status post right IJ tunneled hemodialysis catheter. Signed, Dulcy Fanny. Dellia Nims, RPVI [facility] Electronically Signed   By: Corrie Mckusick D.O.   On: 10/30/2019 18:21   IR US Guide Vasc Access Right  Result Date: 10/30/2019 INDICATION: 49 year old male with a history of rhabdomyolysis, referred for tunneled hemodialysis catheter EXAM: IMAGE GUIDED PLACEMENT OF TUNNELED HEMODIALYSIS CATHETER MEDICATIONS: 2 g Ancef; The antibiotic was administered within an appropriate time interval prior to skin puncture. ANESTHESIA/SEDATION: Moderate (conscious) sedation was employed during this procedure. A total of Versed 2.0 mg and Fentanyl 100 mcg was administered intravenously. Moderate Sedation Time: 18 minutes. The patient's level of consciousness and vital signs were monitored continuously by radiology nursing throughout the procedure under my direct supervision. FLUOROSCOPY TIME:  Fluoroscopy Time: 0 minutes 6 seconds (1 mGy). COMPLICATIONS: None PROCEDURE: Informed written consent was obtained from the patient after a discussion of the risks, benefits, and alternatives to treatment. Questions regarding the procedure were encouraged and answered. The right neck and chest were prepped with chlorhexidine in a sterile fashion, and a sterile drape was applied covering the operative field. Maximum barrier sterile technique with sterile gowns and gloves were used for the procedure. A timeout was performed  prior to the initiation of the procedure. Ultrasound survey was performed. Micropuncture kit was utilized to access  the right internal jugular vein under direct, real-time ultrasound guidance after the overlying soft tissues were anesthetized with 1% lidocaine with epinephrine. Stab incision was made with 11 blade scalpel. Microwire was passed centrally. The microwire was then marked to measure appropriate internal catheter length. External tunneled length was estimated. A total tip to cuff length of 19 cm was selected. 035 guidewire was advanced to the level of the IVC. Skin and subcutaneous tissues of chest wall below the clavicle were generously infiltrated with 1% lidocaine for local anesthesia. A small stab incision was made with 11 blade scalpel. The selected hemodialysis catheter was tunneled in a retrograde fashion from the anterior chest wall to the venotomy incision. Serial dilation was performed and then a peel-away sheath was placed. The catheter was then placed through the peel-away sheath with tips ultimately positioned within the superior aspect of the right atrium. Final catheter positioning was confirmed and documented with a spot radiographic image. The catheter aspirates and flushes normally. The catheter was flushed with appropriate volume heparin dwells. The catheter exit site was secured with a 0-Prolene retention suture. Gel-Foam slurry was infused into the soft tissue tract. The venotomy incision was closed Derma bond and sterile dressing. Dressings were applied at the chest wall. Patient tolerated the procedure well and remained hemodynamically stable throughout. No complications were encountered and no significant blood loss encountered. IMPRESSION: Status post right IJ tunneled hemodialysis catheter. Signed, Dulcy Fanny. Dellia Nims, RPVI [facility] Electronically Signed   By: Corrie Mckusick D.O.   On: 10/30/2019 18:21    Scheduled  Meds: . aspirin EC  81 mg Oral Daily  . Chlorhexidine Gluconate Cloth  6 each Topical Daily  . Chlorhexidine Gluconate Cloth  6 each Topical Q0600  . heparin  5,000 Units Subcutaneous Q8H   Continuous Infusions: . sodium chloride    . sodium chloride       LOS: 4 days    Time spent: 35 mins.    Shawna Clamp, MD [facility]   If 7PM-7AM, please contact night-coverage

## 2019-10-31 NOTE — Progress Notes (Signed)
SLP Cancellation Note  Patient Details Name: PASCO MARCHITTO MRN: [id number] DOB: [birth year]   Cancelled treatment:       Reason Eval/Treat Not Completed: Patient at procedure or test/unavailable (Pt receiving care from NT. SLP will follow up. )  Ryoma Nofziger I. Hardin Negus, [place], [place] Office number [phone] Pager [place] 10/31/2019, 4:03 PM

## 2019-11-01 ENCOUNTER — Encounter (HOSPITAL_COMMUNITY): Payer: BC Managed Care – PPO

## 2019-11-01 DIAGNOSIS — M6282 Rhabdomyolysis: Secondary | ICD-10-CM

## 2019-11-01 DIAGNOSIS — N179 Acute kidney failure, unspecified: Secondary | ICD-10-CM

## 2019-11-01 LAB — COMPREHENSIVE METABOLIC PANEL
ALT: 634 U/L — ABNORMAL HIGH (ref 0–44)
AST: 584 U/L — ABNORMAL HIGH (ref 15–41)
Albumin: 2.3 g/dL — ABNORMAL LOW (ref 3.5–5.0)
Alkaline Phosphatase: 72 U/L (ref 38–126)
Anion gap: 18 — ABNORMAL HIGH (ref 5–15)
BUN: 106 mg/dL — ABNORMAL HIGH (ref 6–20)
CO2: 18 mmol/L — ABNORMAL LOW (ref 22–32)
Calcium: 5.4 mg/dL — CL (ref 8.9–10.3)
Chloride: 99 mmol/L (ref 98–111)
Creatinine, Ser: 11.69 mg/dL — ABNORMAL HIGH (ref 0.61–1.24)
GFR calc Af Amer: 5 mL/min — ABNORMAL LOW (ref >60)
GFR calc non Af Amer: 5 mL/min — ABNORMAL LOW (ref >60)
Glucose, Bld: 120 mg/dL — ABNORMAL HIGH (ref 70–99)
Potassium: 3.5 mmol/L (ref 3.5–5.1)
Sodium: 135 mmol/L (ref 135–145)
Total Bilirubin: 0.6 mg/dL (ref 0.3–1.2)
Total Protein: 5 g/dL — ABNORMAL LOW (ref 6.5–8.1)

## 2019-11-01 LAB — CBC WITH DIFFERENTIAL/PLATELET
Abs Immature Granulocytes: 1.06 10*3/uL — ABNORMAL HIGH (ref 0.00–0.07)
Basophils Absolute: 0.1 10*3/uL (ref 0.0–0.1)
Basophils Relative: 1 %
Eosinophils Absolute: 0.2 10*3/uL (ref 0.0–0.5)
Eosinophils Relative: 1 %
HCT: 36.7 % — ABNORMAL LOW (ref 39.0–52.0)
Hemoglobin: 12.3 g/dL — ABNORMAL LOW (ref 13.0–17.0)
Immature Granulocytes: 7 %
Lymphocytes Relative: 10 %
Lymphs Abs: 1.6 10*3/uL (ref 0.7–4.0)
MCH: 30.1 pg (ref 26.0–34.0)
MCHC: 33.5 g/dL (ref 30.0–36.0)
MCV: 90 fL (ref 80.0–100.0)
Monocytes Absolute: 0.7 10*3/uL (ref 0.1–1.0)
Monocytes Relative: 5 %
Neutro Abs: 12.3 10*3/uL — ABNORMAL HIGH (ref 1.7–7.7)
Neutrophils Relative %: 76 %
Platelets: 113 10*3/uL — ABNORMAL LOW (ref 150–400)
RBC: 4.08 MIL/uL — ABNORMAL LOW (ref 4.22–5.81)
RDW: 13.3 % (ref 11.5–15.5)
WBC: 15.9 10*3/uL — ABNORMAL HIGH (ref 4.0–10.5)
nRBC: 0 % (ref 0.0–0.2)

## 2019-11-01 LAB — MAGNESIUM: Magnesium: 2.5 mg/dL — ABNORMAL HIGH (ref 1.7–2.4)

## 2019-11-01 LAB — PHOSPHORUS: Phosphorus: 9.2 mg/dL — ABNORMAL HIGH (ref 2.5–4.6)

## 2019-11-01 LAB — CK: Total CK: 23258 U/L — ABNORMAL HIGH (ref 49–397)

## 2019-11-01 MED ORDER — CALCIUM CARBONATE ANTACID 500 MG PO CHEW
400.0000 mg | CHEWABLE_TABLET | Freq: Two times a day (BID) | ORAL | Status: DC
  Administered 2019-11-01 – 2019-11-09 (×17): 400 mg via ORAL
  Filled 2019-11-01 (×17): qty 2

## 2019-11-01 MED ORDER — CALCIUM GLUCONATE-NACL 1-0.675 GM/50ML-% IV SOLN
1.0000 g | Freq: Once | INTRAVENOUS | Status: AC
  Administered 2019-11-01: 1000 mg via INTRAVENOUS
  Filled 2019-11-01: qty 50

## 2019-11-01 NOTE — Consult Note (Signed)
Physical Medicine and Rehabilitation Consult   Reason for Consult:  Functional deficits Referring Physician: Dr. Dwyane Dee   HPI: [name] is a 49 y.o. male with history of Crohn's, PE, depression who was admitted on 10/27/19 with RUE/RLE weakness. Per reports, the night before the patient was intoxicated on alcohol, took a [name] and blacked out on a friend's brick floor and laid on this right side till the next morning. UDS positive for cocaine/ETOH level 24.  He work up with severe pain right sided with swollen leg/arm, was hypotensive, had acute renal failure with rhabdomyolysis and treated with fluid boluses and Lokelma. MRI brain showed acute punctate infarcts in right occipital lobe and right cerebellum as well as restricted diffusion in globi pallidi. MRI cspine/lumbar spine done due to weakness and showed mild right and left C4/5 foraminal stenosis and mild facet arthrosis L4-S1. Nephrology following for assistance with renal failure and HD started due to poor recovery.   Dr. Leonel Ramsay felt that weakness was likely a combination--peripheral in nature due to neurapraxia and rhabdomyolysis. Cerebral injury felt to be due to overdose and prolonged down time--question fentanyl/cocaine combo and supportive care recommended. Dr. Stann Mainland following for input on RUE/RLE edema and recommended monitoring for compartment syndrome. He has had increase in RUE edema and duplex today pending.    Pt reports sleeping on and off- doesn't feel like sleeping well- so sleeping/napping during day. LBM today- still voiding some- not normal amount.  Had HD tomorrow.    Review of Systems  Constitutional: Negative for chills and fever.  HENT: Negative for hearing loss.   Eyes: Negative for blurred vision and double vision.  Respiratory: Positive for shortness of breath.   Cardiovascular: Negative for chest pain and palpitations.  Gastrointestinal: Negative for abdominal pain, nausea and vomiting.    Genitourinary:       Foley in place  Musculoskeletal: Positive for back pain, joint pain and myalgias.  Skin: Negative for rash.  Neurological: Positive for sensory change (Numbness/tingling RUE/RLE) and focal weakness. Negative for dizziness and headaches.  Psychiatric/Behavioral: The patient is not nervous/anxious.   All other systems reviewed and are negative.    Past Medical History:  Diagnosis Date  . Allergy   . Crohn's colitis ([place])   . Depression   . Pulmonary embolism [facility])     Past Surgical History:  Procedure Laterality Date  . COLOSTOMY  March 2011  . Colostomy reversal  January 2012  . HIP SURGERY  R hip surgery   . IR FLUORO GUIDE CV LINE RIGHT  10/30/2019  . IR US GUIDE VASC ACCESS RIGHT  10/30/2019    Family History  Problem Relation Age of Onset  . Colon polyps Father   . Multiple sclerosis Sister   . Colon cancer Neg Hx   . Esophageal cancer Neg Hx   . Stomach cancer Neg Hx   . Pancreatic cancer Neg Hx   . Liver disease Neg Hx     Social History:  Married. Independent and working PTA.  He reports that he has been smoking. He has been smoking about 0.50 packs per day. He has never used smokeless tobacco. He reports current alcohol use--3-4 beers/liquor most days of week. He reports that he uses drugs on rare occasions.     Allergies: No Known Allergies    Medications Prior to Admission  Medication Sig Dispense Refill  . acetaminophen (TYLENOL) 500 MG tablet Take 1,500 mg by mouth 2 (two) times daily  as needed for mild pain or moderate pain.     Marland Kitchen inFLIXimab (REMICADE) 100 MG injection Inject 400 mg into the vein every 8 (eight) weeks.    Marland Kitchen loratadine (CLARITIN) 10 MG tablet Take 10 mg by mouth as needed for allergies.    . sildenafil (VIAGRA) 50 MG tablet Take 50 mg by mouth daily as needed for erectile dysfunction.      Home: Home Living Family/patient expects to be discharged to:: Private residence Living Arrangements: Spouse/significant other,  Children Available Help at Discharge: Family Type of Home: House Home Access: Stairs to enter Technical brewer of Steps: 2-3 Home Layout: One level Bathroom Shower/Tub: Tub/shower unit, Multimedia programmer: Programmer, systems: Yes Home Equipment: None  Functional History: Prior Function Level of Independence: Independent Functional Status:  Mobility: Bed Mobility Overal bed mobility: Needs Assistance Bed Mobility: Supine to Sit, Sit to Supine Supine to sit: Mod assist Sit to supine: Min assist General bed mobility comments: Pt required increased assistance to move R leg to edge of bed to rise into sitting.  Pt required decreased assistance to return back to bed with increased time and effort. Transfers Overall transfer level: Needs assistance Equipment used: Rolling walker (2 wheeled) Transfers: Sit to/from Stand Sit to Stand: Min assist Stand pivot transfers: Supervision, Min guard General transfer comment: Cues for hand placement and forward weight shifting to rise into standing. Ambulation/Gait Ambulation/Gait assistance: Min assist Gait Distance (Feet): 80 Feet Assistive device: Rolling walker (2 wheeled) Gait Pattern/deviations: Step-to pattern, Shuffle, Decreased stride length, Trunk flexed, Antalgic, Decreased dorsiflexion - right, Decreased stance time - right General Gait Details: Slow buckle on R with cues knee extension in stance phase lacks dorsiflexion. Gait velocity: decreased Gait velocity interpretation: <1.8 ft/sec, indicate of risk for recurrent falls    ADL: ADL Overall ADL's : Needs assistance/impaired Eating/Feeding: Set up, Sitting Grooming: Set up, Sitting Upper Body Bathing: Supervision/ safety, Set up, Minimal assistance, Sitting Lower Body Bathing: Supervison/ safety, Set up, Min guard, Moderate assistance, Sit to/from stand Lower Body Bathing Details (indicate cue type and reason): RLE deficits and decreased safety  with RW Upper Body Dressing : Supervision/safety, Set up, Moderate assistance, Sitting Lower Body Dressing: Supervision/safety, Set up, Maximal assistance Lower Body Dressing Details (indicate cue type and reason): R hip pain and dcreased AROM to RLE.  R shoulder soreness and numbness to fingertips.  Unable to reach B feet. Toilet Transfer: Supervision/safety, Set up, Min guard, Minimal assistance, RW Functional mobility during ADLs: Minimal assistance, Min guard, Set up, Supervision/safety  Cognition: Cognition Overall Cognitive Status: Within Functional Limits for tasks assessed Orientation Level: Oriented X4 Cognition Arousal/Alertness: Awake/alert Behavior During Therapy: Flat affect Overall Cognitive Status: Within Functional Limits for tasks assessed General Comments: no processing, complex thought, or command following deficits noted.   Blood pressure 123/70, pulse 98, temperature 98 F (36.7 C), temperature source Oral, resp. rate 16, height _0  (1.803 m), weight 103.9 kg, SpO2 96 %. Physical Exam Vitals and nursing note reviewed.  Constitutional:      Comments: Flat affect. Large bruise on right cheek. - sleeping initially, woke to verbal stimuli, flat, but appropriate, NAD  HENT:     Head: Contusion present.     Comments: Right facial edema with ecchymosis and mild numbness.  Ecchymotic areas posterior pharynx (recent uvular  Surgery) Smile equal- tongue midline    Right Ear: External ear normal.     Left Ear: External ear normal.     Nose: Nose  normal. No congestion.     Mouth/Throat:     Mouth: Mucous membranes are dry.     Pharynx: Oropharynx is clear.  Eyes:     General:        Right eye: No discharge.        Left eye: No discharge.     Extraocular Movements: Extraocular movements intact.     Comments: EOMI B/L  Cardiovascular:     Rate and Rhythm: Regular rhythm. Tachycardia present.     Heart sounds: Normal heart sounds.  Pulmonary:     Comments: CTA  B/L- no W/R/R- good air movement Abdominal:     Comments: Soft, NT, ND, (+)BS - hypoactive  Genitourinary:    Comments: Foley in place- pt thought was condom cathter Musculoskeletal:     Cervical back: Normal range of motion. No rigidity.     Comments: RUE/RLE with 2+ nonpitting edema.  LUE- 5-/5 in biceps, triceps, WE, grip and finger abd RUE- bicep 2/5, triceps 3-/5, WE 3-/5, grip 3/5, finger abd 3/5 LLE- 4/4 in HF, KE, KF, DF and PF RLE- HF 2-/5, KE and KF 2-/5, DF 1/5, PF 0/5  Skin:    Comments: Bruise- purple on R face Edema as detailed above- Heels OK; unable to look at backside  Neurological:     Mental Status: He is alert.     Comments: Speech soft without dysarthria. Able to follow multi step commands without difficulty. RLE flaccid and RUE with proximal>distal weakness.  Slow to process/answer Decreased to light touch RUE/RLE_ but absent on R foot/toes  Psychiatric:     Comments: Flat affect     No results found for this or any previous visit (from the past 24 hour(s)). IR Fluoro Guide CV Line Right  Result Date: 10/30/2019 INDICATION: 49 year old male with a history of rhabdomyolysis, referred for tunneled hemodialysis catheter EXAM: IMAGE GUIDED PLACEMENT OF TUNNELED HEMODIALYSIS CATHETER MEDICATIONS: 2 g Ancef; The antibiotic was administered within an appropriate time interval prior to skin puncture. ANESTHESIA/SEDATION: Moderate (conscious) sedation was employed during this procedure. A total of Versed 2.0 mg and Fentanyl 100 mcg was administered intravenously. Moderate Sedation Time: 18 minutes. The patient's level of consciousness and vital signs were monitored continuously by radiology nursing throughout the procedure under my direct supervision. FLUOROSCOPY TIME:  Fluoroscopy Time: 0 minutes 6 seconds (1 mGy). COMPLICATIONS: None PROCEDURE: Informed written consent was obtained from the patient after a discussion of the risks, benefits, and alternatives to treatment.  Questions regarding the procedure were encouraged and answered. The right neck and chest were prepped with chlorhexidine in a sterile fashion, and a sterile drape was applied covering the operative field. Maximum barrier sterile technique with sterile gowns and gloves were used for the procedure. A timeout was performed prior to the initiation of the procedure. Ultrasound survey was performed. Micropuncture kit was utilized to access the right internal jugular vein under direct, real-time ultrasound guidance after the overlying soft tissues were anesthetized with 1% lidocaine with epinephrine. Stab incision was made with 11 blade scalpel. Microwire was passed centrally. The microwire was then marked to measure appropriate internal catheter length. External tunneled length was estimated. A total tip to cuff length of 19 cm was selected. 035 guidewire was advanced to the level of the IVC. Skin and subcutaneous tissues of chest wall below the clavicle were generously infiltrated with 1% lidocaine for local anesthesia. A small stab incision was made with 11 blade scalpel. The selected hemodialysis catheter was tunneled in  a retrograde fashion from the anterior chest wall to the venotomy incision. Serial dilation was performed and then a peel-away sheath was placed. The catheter was then placed through the peel-away sheath with tips ultimately positioned within the superior aspect of the right atrium. Final catheter positioning was confirmed and documented with a spot radiographic image. The catheter aspirates and flushes normally. The catheter was flushed with appropriate volume heparin dwells. The catheter exit site was secured with a 0-Prolene retention suture. Gel-Foam slurry was infused into the soft tissue tract. The venotomy incision was closed Derma bond and sterile dressing. Dressings were applied at the chest wall. Patient tolerated the procedure well and remained hemodynamically stable throughout. No  complications were encountered and no significant blood loss encountered. IMPRESSION: Status post right IJ tunneled hemodialysis catheter. Signed, Dulcy Fanny. Dellia Nims, RPVI Vascular and Interventional Radiology Specialists [facility] Radiology Electronically Signed   By: Corrie Mckusick D.O.   On: 10/30/2019 18:21   IR US Guide Vasc Access Right  Result Date: 10/30/2019 INDICATION: 49 year old male with a history of rhabdomyolysis, referred for tunneled hemodialysis catheter EXAM: IMAGE GUIDED PLACEMENT OF TUNNELED HEMODIALYSIS CATHETER MEDICATIONS: 2 g Ancef; The antibiotic was administered within an appropriate time interval prior to skin puncture. ANESTHESIA/SEDATION: Moderate (conscious) sedation was employed during this procedure. A total of Versed 2.0 mg and Fentanyl 100 mcg was administered intravenously. Moderate Sedation Time: 18 minutes. The patient's level of consciousness and vital signs were monitored continuously by radiology nursing throughout the procedure under my direct supervision. FLUOROSCOPY TIME:  Fluoroscopy Time: 0 minutes 6 seconds (1 mGy). COMPLICATIONS: None PROCEDURE: Informed written consent was obtained from the patient after a discussion of the risks, benefits, and alternatives to treatment. Questions regarding the procedure were encouraged and answered. The right neck and chest were prepped with chlorhexidine in a sterile fashion, and a sterile drape was applied covering the operative field. Maximum barrier sterile technique with sterile gowns and gloves were used for the procedure. A timeout was performed prior to the initiation of the procedure. Ultrasound survey was performed. Micropuncture kit was utilized to access the right internal jugular vein under direct, real-time ultrasound guidance after the overlying soft tissues were anesthetized with 1% lidocaine with epinephrine. Stab incision was made with 11 blade scalpel. Microwire was passed centrally. The microwire was then  marked to measure appropriate internal catheter length. External tunneled length was estimated. A total tip to cuff length of 19 cm was selected. 035 guidewire was advanced to the level of the IVC. Skin and subcutaneous tissues of chest wall below the clavicle were generously infiltrated with 1% lidocaine for local anesthesia. A small stab incision was made with 11 blade scalpel. The selected hemodialysis catheter was tunneled in a retrograde fashion from the anterior chest wall to the venotomy incision. Serial dilation was performed and then a peel-away sheath was placed. The catheter was then placed through the peel-away sheath with tips ultimately positioned within the superior aspect of the right atrium. Final catheter positioning was confirmed and documented with a spot radiographic image. The catheter aspirates and flushes normally. The catheter was flushed with appropriate volume heparin dwells. The catheter exit site was secured with a 0-Prolene retention suture. Gel-Foam slurry was infused into the soft tissue tract. The venotomy incision was closed Derma bond and sterile dressing. Dressings were applied at the chest wall. Patient tolerated the procedure well and remained hemodynamically stable throughout. No complications were encountered and no significant blood loss encountered. IMPRESSION: Status post  right IJ tunneled hemodialysis catheter. Signed, Dulcy Fanny. Dellia Nims, RPVI Vascular and Interventional Radiology Specialists [facility] Radiology Electronically Signed   By: Corrie Mckusick D.O.   On: 10/30/2019 18:21    Assessment/Plan: Diagnosis: R hemiplegia/hemiparesis- likely due to neuropraxia- from lying in 1 position all night- also with ARF- on HD- has R occipital and R cerebellum infarcts as well 1. Does the need for close, 24 hr/day medical supervision in concert with the patient's rehab needs make it unreasonable for this patient to be served in a less intensive setting?  Yes 2. Co-Morbidities requiring supervision/potential complications: Crohn's disease, depression, hx of PE, polysubstance use, HD due to ARF, anuric 3. Due to bladder management, bowel management, safety, skin/wound care, disease management, medication administration, pain management and patient education, does the patient require 24 hr/day rehab nursing? Yes 4. Does the patient require coordinated care of a physician, rehab nurse, therapy disciplines of PT and OT to address physical and functional deficits in the context of the above medical diagnosis(es)? Yes Addressing deficits in the following areas: balance, endurance, locomotion, strength, transferring, bowel/bladder control, bathing, dressing, feeding, grooming and toileting 5. Can the patient actively participate in an intensive therapy program of at least 3 hrs of therapy per day at least 5 days per week? Yes 6. The potential for patient to make measurable gains while on inpatient rehab is good 7. Anticipated functional outcomes upon discharge from inpatient rehab are modified independent, supervision and min assist  with PT, modified independent and supervision with OT, n/a with SLP. 8. Estimated rehab length of stay to reach the above functional goals is: 2-2.5 weeks- maybe 3 weeks 9. Anticipated discharge destination: Home 10. Overall Rehab/Functional Prognosis: good  RECOMMENDATIONS: This patient's condition is appropriate for continued rehabilitative care in the following setting: CIR Patient has agreed to participate in recommended program. Potentially Note that insurance prior authorization may be required for reimbursement for recommended care.  Comment:  1. Pt truly wants to get back on regular schedule to sleep- suggest trazodone  50-100 mg QHS for sleep- start with 50 mg as can titrate up.  2. Could use elevation and possible wrapping to work on R sided edema.  3. Will submit for CIR- is appropriate candidate- will let  admissions coordinators determine insurance issues.  4. Thank you for this consult    Bary Leriche, PA-C 11/01/2019   I have personally performed a face to face diagnostic evaluation of this patient and formulated the key components of the plan.  Additionally, I have personally reviewed laboratory data, imaging studies, as well as relevant notes and concur with the physician assistant's documentation above.

## 2019-11-01 NOTE — Progress Notes (Signed)
Report given to Maudie Mercury, Therapist, sports. Patient is alert and oriented x4 sitting up in bed with call light in reach. VSS. RUE swelling still noted and duplex study should be completed today. SR/ST on tele. Labs pending this AM. Plan is for patient to go to inpatient rehab, consult to be placed. RIJ tunnel cath WNL for HD use.

## 2019-11-01 NOTE — Progress Notes (Signed)
OT Cancellation Note  Patient Details Name: [name] MRN: [id number] DOB: 1970/07/19   Cancelled Treatment:    Reason Eval/Treat Not Completed: Medical issues which prohibited therapy.. patient has been scheduled for RUE duplex due to swelling.  OT will re-schedule and follow as appropriate.    Marbella Markgraf D Darald Uzzle 11/01/2019, 2:43 PM   11/01/2019  Rich, OTR/L  Acute Rehabilitation Services  Office:  [phone]

## 2019-11-01 NOTE — Progress Notes (Signed)
CRITICAL VALUE ALERT  Critical Value:  Calcium 5.4  Date & Time Notied:  11/01/19, 1006  Provider Notified: Shawna Clamp  Orders Received/Actions taken: waiting on orders

## 2019-11-01 NOTE — Evaluation (Signed)
Speech Language Pathology Evaluation Patient Details Name: [name] MRN: [id number] DOB: Jul 10, 1970 Today's Date: 11/01/2019 Time: 0920-0956 SLP Time Calculation (min) (ACUTE ONLY): 36 min  Problem List:  Patient Active Problem List   Diagnosis Date Noted  . Acute renal failure due to rhabdomyolysis ([place]) 10/27/2019  . Hyperkalemia 10/27/2019  . Transaminitis 10/27/2019  . High anion gap metabolic acidosis 14/48/1856  . Alcohol use 10/27/2019  . Substance use disorder 10/27/2019  . Acute cerebral infarction ([place]) 10/27/2019  . Hypotension 10/27/2019  . Crohn's disease ([place]) 10/27/2019  . Lactic acidosis 10/27/2019   Past Medical History:  Past Medical History:  Diagnosis Date  . Allergy   . Crohn's colitis ([place])   . Depression   . Pulmonary embolism [facility])    Past Surgical History:  Past Surgical History:  Procedure Laterality Date  . COLOSTOMY  March 2011  . Colostomy reversal  January 2012  . HIP SURGERY  R hip surgery   . IR FLUORO GUIDE CV LINE RIGHT  10/30/2019  . IR US GUIDE VASC ACCESS RIGHT  10/30/2019   HPI:  (P) Pt is a 49 y.o. male with medical history significant for Crohn's disease, PE not currently on anticoagulation, and depression who presents to the ED for evaluation of right upper and lower extremity weakness and pain. Patient states he was out drinking and took drugs when he got intoxicated and blacked out. He reportedly fell, landing on the right side of his body and woke up unable to move his right arm or leg with significant pain throughout his right upper and lower extremities. Pt presents with acute renal failure due to rhabdomyolysis. MRI brain without contrast shows symmetric restricted diffusion in the globi pallidi, per radiology read most often seen with acute toxic or metabolic insult or hypoxic-ischemic injury.   Assessment / Plan / Recommendation Clinical Impression  SLUMS administred to pt this am with him scoring 25/30 indicative of a mild  neurocognitive disorder. Pt's areas of challenges included memory - retreival, attention to detailed information requiring working memory to manipulate.  Other tasks completed requiring him to follow multi=complex directions were achieved at approx 80% accuracy, improving as testing progressed.     No language or speech deficits apparent nor focal CN deficit.  Pt reports prior surgery on his uvula due to tumor a few weeks prior to this event and thus appears with healing abrasions bilaterally.  He denies pain in said surgical area.  Pt admits to changes with his higher level cognition, attention, working memory and is willing to pursue follow up SLP at next venue of care for maximizing rehab potential.  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   Recommend consideration to incorporate Attention Process Training *ATP* for this pt.  Pt educated and agreeable to plan.    SLP Assessment  SLP Recommendation/Assessment: All further Speech Lanaguage Pathology  needs can be addressed in the next venue of care SLP Visit Diagnosis: Cognitive communication deficit (R41.841)    Follow Up Recommendations  Inpatient Rehab    Frequency and Duration      n/a     SLP Evaluation Cognition  Overall Cognitive Status: Impaired/Different from baseline Arousal/Alertness: Awake/alert Orientation Level: Oriented X4 Attention: Sustained Sustained Attention: Impaired Sustained Attention Impairment: Verbal complex Memory: Impaired (3/5 words independently recalled, 2 of 5 with category cues) Memory Impairment: Retrieval deficit;Other (comment) (decreased working memory) Problem Solving: Impaired Problem Solving Impairment: Functional complex  Safety/Judgment: Appears intact       Comprehension  Auditory Comprehension Yes/No Questions: Not tested Commands: Impaired Two Step Basic Commands: 75-100% accurate Multistep Basic Commands: 75-100% accurate  (3/4) Conversation: Complex Interfering Components: Working Web designer Discrimination: Within Function Limits Reading Comprehension Reading Status: Not tested    Expression Expression Primary Mode of Expression: Verbal Verbal Expression Overall Verbal Expression: Appears within functional limits for tasks assessed Initiation: Impaired Repetition: No impairment Naming: No impairment Pragmatics: Impairment Impairments: Other (comment) (? some flat affect vs pt's personality) Written Expression Dominant Hand: Right Written Expression: Not tested (pt with right hand weakness)   Oral / Motor  Motor Speech Overall Motor Speech: Other (comment) (right facial edema, abrasion, decreased sensation) Phonation: Normal Resonance: Within functional limits Articulation: Within functional limitis Intelligibility: Intelligible Motor Planning: Witnin functional limits Motor Speech Errors: Not applicable   GO                    Macario Golds 11/01/2019, 10:42 AM  Kathleen Lime, MS [place] Office [phone]

## 2019-11-01 NOTE — Progress Notes (Signed)
[facility] Progress Note  Subjective: making a little more urine but not out of the woods.    Vitals:   11/01/19 0857 11/01/19 0859 11/01/19 0900 11/01/19 1151  BP: 124/73 126/79 123/70 118/71  Pulse: 100 96 98 99  Resp: 16 (!) 23  16  Temp: 98 F (36.7 C)   97.8 F (36.6 C)  TempSrc: Oral   Oral  SpO2: 96% 96%  96%  Weight:      Height:        Exam: Gen NAD No jvd or bruits Chest clear bilat to bases RRR no MRG Abd soft ntnd no mass or ascites R leg and arm w/ edema edema, no edema on L side, R cheek abrasion Ext no wounds or ulcers Neuro is alert, Ox 3 , nf ACCESS: R IJ TDC    Home meds:  - remicade 400 IV every 8 wks  - ciagra 50 prn/ claritin 10 prn  - prn's/ vitamins/ supplements     UDS + cocaine     CT abd 9/04 > Adrenals/Urinary Tract: Both adrenal glands appear normal. The kidneys and collecting system appear normal without evidence of urinary tract calculus or hydronephrosis. Bladder is unremarkable. Otherwise >> IMPRESSION: small portion of transverse colon and fat containing anterior umbilical hernia, however no definite evidence bowel obstruction or strangulation. Moderate amount of colonic stool. Patchy streaky airspace opacity at the right lung base which may be due to atelectasis and/or infectious etiology.     MRI brain 9/04 > IMPRESSION: 1. Symmetric restricted diffusion in the globi pallidi. This is most often seen with an acute toxic or metabolic insult (including carbon monoxide poisoning and drugs of abuse) or hypoxic-ischemic injury. No hemorrhage. 2. Punctate acute infarcts in the R occipital lobe and R cerebellum.    CXR 10/27/19 - IMPRESSION: Low lung volumes with bibasilar atelectasis. No evidence of acute traumatic injury         Assessment/ Plan: 1. AKI - suspect rhabdomyolysis w/ swollen R leg/ arm, AKI and high K+ in pt who was unconscious on the ground for too long of a time. CPK > 50K.  AKI due to hypotension/ hypovolemia  and rhabdomyolysis. Had 4L bolus in ED on 9/3. Cr up to mid 8s--> 11.1 today, K better, but will need RRT.  Have d/w pt and wife. S/p TDC, HD #1 10/31/19.  Next HD planned 9/10.     2. Rhabdomyolysis: d/t being unconscious on the floor for too long.  CK > 50,000.  LFTs coming down 3. Hypocalcemia: aggressive repletion, using 3.5 Ca bath in HD. 4. Hyperkalemia - K+ 7's on admit > 5.0--> 4.1.  Continue to follow closely.  Off lokelma 5. Drug abuse 6. H/o Crohn's - hx colostomy 2011, reversal in 2012, on [place] 11/01/2019, 12:07 PM   Recent Labs  Lab 10/29/19 1811 10/30/19 0452 10/31/19 0634 11/01/19 0747  K 4.4   < > 4.0 3.5  BUN 80*   < > 130* 106*  CREATININE 9.70*   < > 13.86* 11.69*  CALCIUM 5.8*   < > 4.6* 5.4*  PHOS 11.2*  --   --  9.2*  HGB 11.8*   < > 11.1* 12.3*   < > = values in this interval not displayed.   Inpatient medications: . aspirin EC  81 mg Oral Daily  . calcium carbonate  400 mg of elemental calcium Oral BID  . Chlorhexidine Gluconate Cloth  6 each Topical Daily  . Chlorhexidine Gluconate Cloth  6 each Topical Q0600  . heparin  5,000 Units Subcutaneous Q8H   . sodium chloride    . sodium chloride    . calcium gluconate 1,000 mg (11/01/19 1155)   sodium chloride, sodium chloride, alteplase, heparin, HYDROmorphone (DILAUDID) injection, lidocaine (PF), lidocaine-prilocaine, ondansetron **OR** ondansetron (ZOFRAN) IV, pentafluoroprop-tetrafluoroeth, senna-docusate

## 2019-11-01 NOTE — Progress Notes (Addendum)
PROGRESS NOTE    [name]  MRN:[id number] DOB: 09/24/1970 DOA: 10/27/2019 PCP: Griffin, John, MD    Brief Narrative:  Danny Clineis a 49 y.o.malewith medical history significant forCrohn's disease, PE not currently on anticoagulation, and depression who presents to the ED for evaluation ofright upper and lower extremity weakness and pain. Patient stated he was out drinking last night (10/26/2019) when he got intoxicated on alcohol, Molly, cocaine. He blacked out and did not recall the events occurring later that night. He apparently fell onto a brick floor kitchen at his friend's residence landing on the right side of his body and the right side of his face. He woke up, unable to move his right arm or leg and had significant pain throughout his right upper and lower extremities. He was brought to the ED by hissignificant other. ED Course: Initial vitals showed BP 63/40, pulse 115, RR 29,  SPO2 95% on room air. Labs significant for potassium >7.5, BUN 31, creatinine 5.26, bicarb 14, ALT 1851, anion gap 24, WBC 43.5, hemoglobin 19.3, hematocrit 56.4, platelets 276,000, serum ethanol elevated at 24, lactic acid 6.8, CK >50,000, UDS is positive for cocaine. CT head/maxillofacial/cervical spine without contrast were negative for acute intracranial abnormality. Portable chest x-ray unremarkable Right ankle and hip x-ray is negative for acute fracture or dislocation. MRI brain shows punctate acute infarcts in the right septal lobe and right cerebellum. CT abdomen/pelvis unremarkable.  Nephrology, Neurology, orthopedic surgery were consulted.  Patient admitted for further management.  Patient underwent placement of right IJ tunnel catheter.  Patient underwent hemodialysis 10/31/19.  He reports feeling slightly better, labs are improving.  He is getting replacement for electrolyte abnormalities. Next HD 9/10. Neurology recommended aggressive treatment for resistant hypocalcemia,  stroke work-up  is essentially completed.  Once acute kidney injury and acute liver injury resolves Patient needs to be started on statins.  He has had a stroke likely from hypoperfusion event versus vasospasm from cocaine use.  Ortho consulted stated no concern for compartment syndrome.    Assessment & Plan:   Principal Problem:   Acute renal failure due to rhabdomyolysis (HCC) Active Problems:   Hyperkalemia   Transaminitis   High anion gap metabolic acidosis   Alcohol use   Substance use disorder   Acute cerebral infarction (HCC)   Hypotension   Crohn's disease (HCC)   Lactic acidosis   Rhabdomyolysis with acute renal failure /High anion gap metabolic acidosis/ Hypotensive on arrival. CK >50,000--> 22,988-->50,00 >> 23000 Continue to trend CK. Cr worsened 8.42-->13.86- > 11.69 Nephrology consulted, started on bicarb drip, due to minimal UO,  He underwent Hemodialysis 10/31/19, Next HD 9/10. Daily CMP  Hyperkalemia Resolved.  Hypocalcemia.  Repeat 1 dose of calcium gluconate 2 g Calcium 5.4, calcium gluconate 1 g given Daily CMP  Transaminitis: AST 4337, ALT 1851 on admission, trending down AST 1292 , ALT 1547  >> AST 584, ALT634 Multifactorial from rhabdomyolysis, hypoperfusion, alcohol use Normal acetaminophen level Daily CMP  Leukocytosis >> Improving Afebrile, no signs of infection. Likely reactive from rhabdomyolysis in addition to hemoconcentration  Right upper and lower extremity weakness /edema /pain/acute infarction of the right occipital lobe and right cerebellum/watershed stroke/globi pallidi MRI brain shows punctate acute infarcts in the right septal lobe and right cerebellum. Neurology consulted, right-sided weakness likely peripheral in nature (nerve palsy), may need outpatient EMG if continues to be weak MRI cervical/lumbar spine fairly unremarkable, normal MRA of the head and neck Echo : LVEF 55% Orthopedics consulted for   possible developing compartment  syndrome, likely acute neurologic palsy, recommend outpatient EMG if no improvement. No concerns for compartment syndrome. PT/OT Right am swollen,  Right arm venous duplex ordered to r/o DVT.  Crohn's disease Follows with [facility], Dr. Armbruster.  Has been on Remicade monotherapy.  Currently stable.  Hold Remicade.  Substance/alcohol abuse. Denies any prior history of withdrawal. UDS positive for cocaine, reports using Molly Continue CIWA, no standing Ativan at this time given multiple medical complications as above    DVT prophylaxis:  Heparin Code Status: Full Family Communication: Family was at bed side. Disposition Plan: . Dispo: The patient is from: Home  Anticipated d/c is to:  [facility]   Anticipated d/c date is: 1-2 days  Patient currently is not medically stable to d/c.    Consultants:   Nephrology, Neurology, Orthopaedics.  Procedures:  Hemodialysis. Antimicrobials:  Anti-infectives (From admission, onward)   Start     Dose/Rate Route Frequency Ordered Stop   10/30/19 1737  ceFAZolin (ANCEF) 2-4 GM/100ML-% IVPB       Note to Pharmacy: Shelton, Whitney   : cabinet override      10/30/19 1737 10/30/19 1740      Subjective: Patient was seen and examined at bedside.  Overnight events noted.   Patient reports feeling slightly better, still c/o: soreness in right arm and leg.   Denies any dizziness.     Objective: Vitals:   11/01/19 0857 11/01/19 0859 11/01/19 0900 11/01/19 1151  BP: 124/73 126/79 123/70 118/71  Pulse: 100 96 98 99  Resp: 16 (!) 23  16  Temp: 98 F (36.7 C)   97.8 F (36.6 C)  TempSrc: Oral   Oral  SpO2: 96% 96%  96%  Weight:      Height:        Intake/Output Summary (Last 24 hours) at 11/01/2019 1600 Last data filed at 11/01/2019 1100 Gross per 24 hour  Intake --  Output 25 ml  Net -25 ml   Filed Weights   10/31/19 0929 11/01/19 0500 11/01/19 0659  Weight: 103.7 kg 103.9 kg 103.9 kg     Examination:  General exam: Appears calm and comfortable, bruise noted on right cheek Respiratory system: Clear to auscultation. Respiratory effort normal. Cardiovascular system: S1 & S2 heard, RRR. No JVD, murmurs, rubs, gallops or clicks. No pedal edema. Gastrointestinal system: Abdomen is nondistended, soft and nontender. No organomegaly or masses felt. Normal bowel sounds heard. Central nervous system: Alert and oriented. No focal neurological deficits. Extremities: Right arm and leg swelling. Skin: No rashes, lesions or ulcers Psychiatry: Judgement and insight appear normal. Mood & affect appropriate.     Data Reviewed: I have personally reviewed following labs and imaging studies  CBC: Recent Labs  Lab 10/27/19 1705 10/27/19 2144 10/29/19 0314 10/29/19 1811 10/30/19 0452 10/31/19 0634 11/01/19 0747  WBC 43.5*   < > 18.5* 14.9* 11.8* 12.4* 15.9*  NEUTROABS 37.4*  --  16.5*  --  10.2* 10.1* 12.3*  HGB 19.3*   < > 13.1 11.8* 10.9* 11.1* 12.3*  HCT 56.4*   < > 38.6* 35.9* 32.5* 33.8* 36.7*  MCV 91.6   < > 91.3 90.9 90.3 90.9 90.0  PLT 276   < > 137* 125* 115* 126* 113*   < > = values in this interval not displayed.   Basic Metabolic Panel: Recent Labs  Lab 10/28/19 0649 10/28/19 1635 10/29/19 0314 10/29/19 1811 10/30/19 0452 10/31/19 0634 11/01/19 0747  NA 137   < >   132* 132* 136 133* 135  K 5.0   < > 4.6 4.4 4.1 4.0 3.5  CL 106   < > 97* 98 99 95* 99  CO2 15*   < > 16* 15* 17* 16* 18*  GLUCOSE 143*   < > 114* 110* 100* 97 120*  BUN 46*   < > 63* 80* 93* 130* 106*  CREATININE 6.06*   < > 8.42* 9.70* 11.09* 13.86* 11.69*  CALCIUM 5.9*   < > 5.8* 5.8* 5.4* 4.6* 5.4*  MG  --   --  2.2  --   --   --  2.5*  PHOS 10.1*  --   --  11.2*  --   --  9.2*   < > = values in this interval not displayed.   GFR: Estimated Creatinine Clearance: 9.5 mL/min (A) (by C-G formula based on SCr of 11.69 mg/dL (H)). Liver Function Tests: Recent Labs  Lab 10/28/19 0500  10/28/19 0649 10/29/19 0314 10/29/19 1811 10/30/19 0452 10/31/19 0634 11/01/19 0747  AST 9,139*  --  1,796*  --  2,731* 1,292* 584*  ALT 3,179*  --  1,133*  --  2,474* 1,547* 634*  ALKPHOS 72  --  94  --  77 74 72  BILITOT 0.9  --  1.3*  --  1.2 0.9 0.6  PROT 5.5*  --  5.0*  --  4.7* 5.2* 5.0*  ALBUMIN 3.0*   < > 2.6* 2.5* 2.3* 2.5* 2.3*   < > = values in this interval not displayed.   No results for input(s): LIPASE, AMYLASE in the last 168 hours. No results for input(s): AMMONIA in the last 168 hours. Coagulation Profile: No results for input(s): INR, PROTIME in the last 168 hours. Cardiac Enzymes: Recent Labs  Lab 10/28/19 0137 10/29/19 0314 10/30/19 0452 10/31/19 0634 11/01/19 0747  CKTOTAL >50,000* 22,988* >50,000* >50,000* 23,258*   BNP (last 3 results) No results for input(s): PROBNP in the last 8760 hours. HbA1C: No results for input(s): HGBA1C in the last 72 hours. CBG: Recent Labs  Lab 10/27/19 1600 10/27/19 2259  GLUCAP 97 174*   Lipid Profile: No results for input(s): CHOL, HDL, LDLCALC, TRIG, CHOLHDL, LDLDIRECT in the last 72 hours. Thyroid Function Tests: No results for input(s): TSH, T4TOTAL, FREET4, T3FREE, THYROIDAB in the last 72 hours. Anemia Panel: No results for input(s): VITAMINB12, FOLATE, FERRITIN, TIBC, IRON, RETICCTPCT in the last 72 hours. Sepsis Labs: Recent Labs  Lab 10/27/19 1950 10/27/19 2249 10/28/19 0650  LATICACIDVEN 6.8* 5.4* 3.2*    Recent Results (from the past 240 hour(s))  Culture, blood (Routine X 2) w Reflex to ID Panel     Status: None (Preliminary result)   Collection Time: 10/27/19  8:43 PM   Specimen: BLOOD  Result Value Ref Range Status   Specimen Description   Final    BLOOD LEFT ANTECUBITAL Performed at [facility], [address] [place], [place] [address]    Special Requests   Final    BOTTLES DRAWN AEROBIC AND ANAEROBIC Blood Culture results may not be optimal due to an excessive  volume of blood received in culture bottles Performed at [facility], [address] [place], [place] [address]    Culture   Final    NO GROWTH 4 DAYS Performed at [facility], [address] [place], [place] [address]    Report Status PENDING  Incomplete  SARS Coronavirus 2 by RT PCR (hospital order, performed   in [facility] hospital lab) Nasopharyngeal Nasopharyngeal Swab     Status: None   Collection Time: 10/27/19  8:43 PM   Specimen: Nasopharyngeal Swab  Result Value Ref Range Status   SARS Coronavirus 2 NEGATIVE NEGATIVE Final    Comment: (NOTE) SARS-CoV-2 target nucleic acids are NOT DETECTED.  The SARS-CoV-2 RNA is generally detectable in upper and lower respiratory specimens during the acute phase of infection. The lowest concentration of SARS-CoV-2 viral copies this assay can detect is 250 copies / mL. A negative result does not preclude SARS-CoV-2 infection and should not be used as the sole basis for treatment or other patient management decisions.  A negative result may occur with improper specimen collection / handling, submission of specimen other than nasopharyngeal swab, presence of viral mutation(s) within the areas targeted by this assay, and inadequate number of viral copies (<250 copies / mL). A negative result must be combined with clinical observations, patient history, and epidemiological information.  Fact Sheet for Patients:   https://www.fda.gov/media/136312/download  Fact Sheet for Healthcare Providers: https://www.fda.gov/media/136313/download  This test is not yet approved or  cleared by the [place] FDA and has been authorized for detection and/or diagnosis of SARS-CoV-2 by FDA under an Emergency Use Authorization (EUA).  This EUA will remain in effect (meaning this test can be used) for the duration of the COVID-19 declaration under Section 564(b)(1) of the Act, 21 U.S.C. section 360bbb-3(b)(1), unless the  authorization is terminated or revoked sooner.  Performed at [facility], [address] [place], [place] [address]   Culture, blood (Routine X 2) w Reflex to ID Panel     Status: None (Preliminary result)   Collection Time: 10/27/19  9:12 PM   Specimen: BLOOD  Result Value Ref Range Status   Specimen Description   Final    BLOOD RIGHT ANTECUBITAL Performed at [facility], [address] [place], [place] [address]    Special Requests   Final    BOTTLES DRAWN AEROBIC AND ANAEROBIC Blood Culture adequate volume Performed at [facility], [address] [place], [place] [address]    Culture   Final    NO GROWTH 4 DAYS Performed at [facility], [address] [place], [place] [address]    Report Status PENDING  Incomplete  MRSA PCR Screening     Status: None   Collection Time: 10/29/19  1:44 PM   Specimen: Nasopharyngeal  Result Value Ref Range Status   MRSA by PCR NEGATIVE NEGATIVE Final    Comment:        The GeneXpert MRSA Assay (FDA approved for NASAL specimens only), is one component of a comprehensive MRSA colonization surveillance program. It is not intended to diagnose MRSA infection nor to guide or monitor treatment for MRSA infections. Performed at  [facility], [address] [place], [place] [address]      Radiology Studies: IR Fluoro Guide CV Line Right  Result Date: 10/30/2019 INDICATION: 49-year-old male with a history of rhabdomyolysis, referred for tunneled hemodialysis catheter EXAM: IMAGE GUIDED PLACEMENT OF TUNNELED HEMODIALYSIS CATHETER MEDICATIONS: 2 g Ancef; The antibiotic was administered within an appropriate time interval prior to skin puncture. ANESTHESIA/SEDATION: Moderate (conscious) sedation was employed during this procedure. A total of Versed 2.0 mg and Fentanyl 100 mcg was administered intravenously. Moderate Sedation Time: 18 minutes. The patient's level of  consciousness and vital signs were monitored continuously by radiology nursing throughout the procedure under my direct supervision.   FLUOROSCOPY TIME:  Fluoroscopy Time: 0 minutes 6 seconds (1 mGy). COMPLICATIONS: None PROCEDURE: Informed written consent was obtained from the patient after a discussion of the risks, benefits, and alternatives to treatment. Questions regarding the procedure were encouraged and answered. The right neck and chest were prepped with chlorhexidine in a sterile fashion, and a sterile drape was applied covering the operative field. Maximum barrier sterile technique with sterile gowns and gloves were used for the procedure. A timeout was performed prior to the initiation of the procedure. Ultrasound survey was performed. Micropuncture kit was utilized to access the right internal jugular vein under direct, real-time ultrasound guidance after the overlying soft tissues were anesthetized with 1% lidocaine with epinephrine. Stab incision was made with 11 blade scalpel. Microwire was passed centrally. The microwire was then marked to measure appropriate internal catheter length. External tunneled length was estimated. A total tip to cuff length of 19 cm was selected. 035 guidewire was advanced to the level of the IVC. Skin and subcutaneous tissues of chest wall below the clavicle were generously infiltrated with 1% lidocaine for local anesthesia. A small stab incision was made with 11 blade scalpel. The selected hemodialysis catheter was tunneled in a retrograde fashion from the anterior chest wall to the venotomy incision. Serial dilation was performed and then a peel-away sheath was placed. The catheter was then placed through the peel-away sheath with tips ultimately positioned within the superior aspect of the right atrium. Final catheter positioning was confirmed and documented with a spot radiographic image. The catheter aspirates and flushes normally. The catheter was flushed with  appropriate volume heparin dwells. The catheter exit site was secured with a 0-Prolene retention suture. Gel-Foam slurry was infused into the soft tissue tract. The venotomy incision was closed Derma bond and sterile dressing. Dressings were applied at the chest wall. Patient tolerated the procedure well and remained hemodynamically stable throughout. No complications were encountered and no significant blood loss encountered. IMPRESSION: Status post right IJ tunneled hemodialysis catheter. Signed, Jaime S. Wagner, DO, RPVI [facility] Electronically Signed   By: Jaime  Wagner D.O.   On: 10/30/2019 18:21   IR US Guide Vasc Access Right  Result Date: 10/30/2019 INDICATION: 49-year-old male with a history of rhabdomyolysis, referred for tunneled hemodialysis catheter EXAM: IMAGE GUIDED PLACEMENT OF TUNNELED HEMODIALYSIS CATHETER MEDICATIONS: 2 g Ancef; The antibiotic was administered within an appropriate time interval prior to skin puncture. ANESTHESIA/SEDATION: Moderate (conscious) sedation was employed during this procedure. A total of Versed 2.0 mg and Fentanyl 100 mcg was administered intravenously. Moderate Sedation Time: 18 minutes. The patient's level of consciousness and vital signs were monitored continuously by radiology nursing throughout the procedure under my direct supervision. FLUOROSCOPY TIME:  Fluoroscopy Time: 0 minutes 6 seconds (1 mGy). COMPLICATIONS: None PROCEDURE: Informed written consent was obtained from the patient after a discussion of the risks, benefits, and alternatives to treatment. Questions regarding the procedure were encouraged and answered. The right neck and chest were prepped with chlorhexidine in a sterile fashion, and a sterile drape was applied covering the operative field. Maximum barrier sterile technique with sterile gowns and gloves were used for the procedure. A timeout was performed prior to the initiation of  the procedure. Ultrasound survey was performed. Micropuncture kit was utilized to access the right internal jugular vein under direct, real-time ultrasound guidance after the overlying soft tissues were anesthetized with 1% lidocaine with epinephrine. Stab incision was made with 11 blade scalpel. Microwire was   passed centrally. The microwire was then marked to measure appropriate internal catheter length. External tunneled length was estimated. A total tip to cuff length of 19 cm was selected. 035 guidewire was advanced to the level of the IVC. Skin and subcutaneous tissues of chest wall below the clavicle were generously infiltrated with 1% lidocaine for local anesthesia. A small stab incision was made with 11 blade scalpel. The selected hemodialysis catheter was tunneled in a retrograde fashion from the anterior chest wall to the venotomy incision. Serial dilation was performed and then a peel-away sheath was placed. The catheter was then placed through the peel-away sheath with tips ultimately positioned within the superior aspect of the right atrium. Final catheter positioning was confirmed and documented with a spot radiographic image. The catheter aspirates and flushes normally. The catheter was flushed with appropriate volume heparin dwells. The catheter exit site was secured with a 0-Prolene retention suture. Gel-Foam slurry was infused into the soft tissue tract. The venotomy incision was closed Derma bond and sterile dressing. Dressings were applied at the chest wall. Patient tolerated the procedure well and remained hemodynamically stable throughout. No complications were encountered and no significant blood loss encountered. IMPRESSION: Status post right IJ tunneled hemodialysis catheter. Signed, Jaime S. Wagner, DO, RPVI [facility] Electronically Signed   By: Jaime  Wagner D.O.   On: 10/30/2019 18:21    Scheduled Meds: . aspirin EC  81 mg  Oral Daily  . calcium carbonate  400 mg of elemental calcium Oral BID  . Chlorhexidine Gluconate Cloth  6 each Topical Daily  . Chlorhexidine Gluconate Cloth  6 each Topical Q0600  . heparin  5,000 Units Subcutaneous Q8H   Continuous Infusions: . sodium chloride    . sodium chloride       LOS: 5 days    Time spent: 35 mins.     , MD [facility]   If 7PM-7AM, please contact night-coverage 

## 2019-11-02 ENCOUNTER — Inpatient Hospital Stay (HOSPITAL_COMMUNITY): Payer: BC Managed Care – PPO

## 2019-11-02 DIAGNOSIS — R609 Edema, unspecified: Secondary | ICD-10-CM

## 2019-11-02 DIAGNOSIS — M7989 Other specified soft tissue disorders: Secondary | ICD-10-CM

## 2019-11-02 LAB — CBC WITH DIFFERENTIAL/PLATELET
Abs Immature Granulocytes: 3.27 10*3/uL — ABNORMAL HIGH (ref 0.00–0.07)
Basophils Absolute: 0.1 10*3/uL (ref 0.0–0.1)
Basophils Relative: 0 %
Eosinophils Absolute: 0.3 10*3/uL (ref 0.0–0.5)
Eosinophils Relative: 1 %
HCT: 41.6 % (ref 39.0–52.0)
Hemoglobin: 14.3 g/dL (ref 13.0–17.0)
Immature Granulocytes: 14 %
Lymphocytes Relative: 9 %
Lymphs Abs: 2.1 10*3/uL (ref 0.7–4.0)
MCH: 31 pg (ref 26.0–34.0)
MCHC: 34.4 g/dL (ref 30.0–36.0)
MCV: 90 fL (ref 80.0–100.0)
Monocytes Absolute: 1 10*3/uL (ref 0.1–1.0)
Monocytes Relative: 4 %
Neutro Abs: 17.2 10*3/uL — ABNORMAL HIGH (ref 1.7–7.7)
Neutrophils Relative %: 72 %
Platelets: 136 10*3/uL — ABNORMAL LOW (ref 150–400)
RBC: 4.62 MIL/uL (ref 4.22–5.81)
RDW: 13.8 % (ref 11.5–15.5)
WBC: 24 10*3/uL — ABNORMAL HIGH (ref 4.0–10.5)
nRBC: 0.1 % (ref 0.0–0.2)

## 2019-11-02 LAB — COMPREHENSIVE METABOLIC PANEL
ALT: 287 U/L — ABNORMAL HIGH (ref 0–44)
AST: 330 U/L — ABNORMAL HIGH (ref 15–41)
Albumin: 2.2 g/dL — ABNORMAL LOW (ref 3.5–5.0)
Alkaline Phosphatase: 63 U/L (ref 38–126)
Anion gap: 19 — ABNORMAL HIGH (ref 5–15)
BUN: 127 mg/dL — ABNORMAL HIGH (ref 6–20)
CO2: 16 mmol/L — ABNORMAL LOW (ref 22–32)
Calcium: 5.7 mg/dL — CL (ref 8.9–10.3)
Chloride: 96 mmol/L — ABNORMAL LOW (ref 98–111)
Creatinine, Ser: 13.86 mg/dL — ABNORMAL HIGH (ref 0.61–1.24)
GFR calc Af Amer: 4 mL/min — ABNORMAL LOW (ref >60)
GFR calc non Af Amer: 4 mL/min — ABNORMAL LOW (ref >60)
Glucose, Bld: 124 mg/dL — ABNORMAL HIGH (ref 70–99)
Potassium: 4.2 mmol/L (ref 3.5–5.1)
Sodium: 131 mmol/L — ABNORMAL LOW (ref 135–145)
Total Bilirubin: 0.9 mg/dL (ref 0.3–1.2)
Total Protein: 4.9 g/dL — ABNORMAL LOW (ref 6.5–8.1)

## 2019-11-02 LAB — CULTURE, BLOOD (ROUTINE X 2)
Culture: NO GROWTH
Culture: NO GROWTH
Special Requests: ADEQUATE

## 2019-11-02 LAB — MAGNESIUM: Magnesium: 2.5 mg/dL — ABNORMAL HIGH (ref 1.7–2.4)

## 2019-11-02 LAB — PHOSPHORUS: Phosphorus: 11 mg/dL — ABNORMAL HIGH (ref 2.5–4.6)

## 2019-11-02 LAB — CK: Total CK: 36692 U/L — ABNORMAL HIGH (ref 49–397)

## 2019-11-02 MED ORDER — HEPARIN SODIUM (PORCINE) 1000 UNIT/ML IJ SOLN
INTRAMUSCULAR | Status: AC
  Filled 2019-11-02: qty 4

## 2019-11-02 MED ORDER — CALCIUM GLUCONATE-NACL 2-0.675 GM/100ML-% IV SOLN: 2.0000 g | Freq: Once | INTRAVENOUS | Status: DC

## 2019-11-02 MED ORDER — SODIUM CHLORIDE 0.9% FLUSH
10.0000 mL | Freq: Two times a day (BID) | INTRAVENOUS | Status: DC
  Administered 2019-11-02 – 2019-11-03 (×3): 10 mL

## 2019-11-02 MED ORDER — CALCIUM GLUCONATE-NACL 2-0.675 GM/100ML-% IV SOLN
2.0000 g | Freq: Once | INTRAVENOUS | Status: AC
  Administered 2019-11-02: 2000 mg via INTRAVENOUS
  Filled 2019-11-02: qty 100

## 2019-11-02 MED ORDER — SODIUM CHLORIDE 0.9% FLUSH: 10.0000 mL | INTRAVENOUS | Status: DC | PRN

## 2019-11-02 NOTE — Progress Notes (Signed)
HD treatment ended 35 min early because system clotted, MD notified and she was ok to end treatment.

## 2019-11-02 NOTE — Progress Notes (Addendum)
Physical Therapy Treatment Patient Details Name: [name] MRN: [id number] DOB: [birth year] Today's Date: 11/02/2019    History of Present Illness 49 year old male presents with decreased level of consciousness. Patient states that he took [name] just prior to arrival. Does complain of a fall,  woke this morning and had pain to the right side of his face. Also has bilateral lower extremity muscle aches, admitted with ARF and rhabdomyolysis.   Brain MRI-bilateral basal ganglia infarcts and posterior watershed infarcts on right. PMH: PE, tobacco abuse    PT Comments    Today's skilled session continued to focus on mobility progressing and strengthening. Pt reporting pain at 6-7/10 after session. No gait today as HD tech arrived to take pt for dialysis. Acute PT to continue during pt's hospital stay.    Follow Up Recommendations  CIR     Equipment Recommendations  Rolling walker with 5" wheels;3in1 (PT)       Precautions / Restrictions Precautions Precautions: Fall Precaution Comments: foot drop on RLE Restrictions Weight Bearing Restrictions: No    , Mobility  Bed Mobility Overal bed mobility: Needs Assistance Bed Mobility: Supine to Sit;Sit to Supine     Supine to sit: Mod assist;HOB elevated Sit to supine: Min assist   General bed mobility comments: with HOB about 30 degrees- assist needed to advance right LE toward edge of bed going toward right side. mod assist to bring trunk up into sitting. once sitting pt needed min assist with cues to scoot fully to edge of bed so feet touch floor. with HOB flat min assist needed to lift and clear bed surface with right LE to return to supine. pt able to roll toward right side with min guard assit to allow for pads to be repositioned.  Transfers Overall transfer level: Needs assistance Equipment used: Rolling walker (2 wheeled) Transfers: Sit to/from Stand Sit to Stand: Min assist         General transfer comment: Cues for  hand placement and forward weight shifting to rise into standing.  Ambulation/Gait Ambulation/Gait assistance: Min assist Gait Distance (Feet): 5 Feet Assistive device: Rolling walker (2 wheeled) Gait Pattern/deviations: Step-to pattern;Shuffle;Trunk flexed;Antalgic;Decreased dorsiflexion - right;Decreased stance time - right     General Gait Details: 5 feet laterally along edge of bed with RW support, min assist. minimal right LE hip/knee flexion with decreased DF noted as well.         Cognition   Behavior During Therapy: Flat affect Overall Cognitive Status: Impaired/Different from baseline Area of Impairment: Safety/judgement;Problem solving                General Comments: slow proccessing, decreased initiation with mobility      Exercises General Exercises - Lower Extremity Ankle Circles/Pumps: AROM;AAROM;Strengthening;Both;10 reps;Supine;Limitations Ankle Circles/Pumps Limitations: AA on right with trace DF, active PF Quad Sets: AROM;Strengthening;Both;10 reps;Supine;Limitations Quad Sets Limitations: 5 sec holds with each rep Heel Slides: AAROM;Strengthening;Right;10 reps;Supine;Limitations Heel Slides Limitations: min AA with decreased range noted Straight Leg Raises: AAROM;Strengthening;10 reps;Right;Supine;Limitations Straight Leg Raises Limitations: assist needed for controlled movements and to prevent ext lag Hip Flexion/Marching: AAROM;AROM;Strengthening;Both;5 reps;Standing;Limitations Hip Flexion/Marching Limitations: with RW support, alternating marching with AA needed on right side. Heel Raises: AROM;Strengthening;Both;5 reps;Standing;Limitations Heel Raises Limitations: with RW suppport, min guard assist. heavy UE reliance with increased right UE pain therefore stopped at 5th rep.     Pertinent Vitals/Pain Pain Assessment: 0-10 Pain Score: 7  Pain Location: right arm and leg Pain Descriptors / Indicators: Grimacing;Sore Pain Intervention(s):  Limited activity within patient's tolerance;Monitored during session;Premedicated before session;Repositioned     PT Goals (current goals can now be found in the care plan section) Acute Rehab PT Goals Patient Stated Goal: I want to use my R leg better and get back home. PT Goal Formulation: With patient Time For Goal Achievement: 11/11/19 Potential to Achieve Goals: Good Progress towards PT goals: Progressing toward goals    Frequency    Min 4X/week      PT Plan Current plan remains appropriate       AM-PAC PT "6 Clicks" Mobility   Outcome Measure  Help needed turning from your back to your side while in a flat bed without using bedrails?: A Little Help needed moving from lying on your back to sitting on the side of a flat bed without using bedrails?: A Lot Help needed moving to and from a bed to a chair (including a wheelchair)?: A Little Help needed standing up from a chair using your arms (e.g., wheelchair or bedside chair)?: A Little Help needed to walk in hospital room?: A Little Help needed climbing 3-5 steps with a railing? : A Lot 6 Click Score: 16    End of Session Equipment Utilized During Treatment: Gait belt Activity Tolerance: Patient tolerated treatment well;Patient limited by pain Patient left: in bed;with call bell/phone within reach;with bed alarm set Nurse Communication: Mobility status PT Visit Diagnosis: Difficulty in walking, not elsewhere classified (R26.2);Muscle weakness (generalized) (M62.81)     Time: 7289-7915 PT Time Calculation (min) (ACUTE ONLY): 18 min  Charges:  $Therapeutic Exercise: 8-22 mins                    Willow Ora, PTA, [facility] Acute Rehab Services Office- [phone] 11/02/19, 12:15 PM   Willow Ora 11/02/2019, 12:15 PM

## 2019-11-02 NOTE — Progress Notes (Signed)
Inpatient Rehabilitation Admissions Coordinator  I will follow up with patient's progress on Monday to assist with planning dispo options.  Danne Baxter, RN, MSN Rehab Admissions Coordinator [phone] 11/02/2019 2:22 PM

## 2019-11-02 NOTE — Progress Notes (Signed)
CRITICAL VALUE ALERT Calcium Critical Value:  5.7  Date & Time Notied: 11/02/19  0500  Provider Notified: [facility]  Orders Received/Actions taken: Pt will go to HD today , with receive Ca bath there

## 2019-11-02 NOTE — Progress Notes (Signed)
PROGRESS NOTE    [name]  BRA:[id number] DOB: Jan 07, 1971 DOA: 10/27/2019 PCP: Lavone Orn, MD    Brief Narrative:  [name] a 49 y.o.malewith medical history significant forCrohn's disease, PE not currently on anticoagulation, and depression who presents to the ED for evaluation ofright upper and lower extremity weakness and pain. Patient stated he was out drinking last night (10/26/2019) when he got intoxicated on alcohol, Molly, cocaine. He blacked out and did not recall the events occurring later that night. He apparently fell onto a brick floor kitchen at his friend's residence landing on the right side of his body and the right side of his face. He woke up, unable to move his right arm or leg and had significant pain throughout his right upper and lower extremities. He was brought to the ED by hissignificant other. ED Course: Initial vitals showed BP 63/40, pulse 115, RR 29,  SPO2 95% on room air. Labs significant for potassium >7.5, BUN 31, creatinine 5.26, bicarb 14, ALT 1851, anion gap 24, WBC 43.5, hemoglobin 19.3, hematocrit 56.4, platelets 276,000, serum ethanol elevated at 24, lactic acid 6.8, CK >50,000, UDS is positive for cocaine. CT head/maxillofacial/cervical spine without contrast were negative for acute intracranial abnormality. Portable chest x-ray unremarkable Right ankle and hip x-ray is negative for acute fracture or dislocation. MRI brain shows punctate acute infarcts in the right septal lobe and right cerebellum. CT abdomen/pelvis unremarkable.  Nephrology, Neurology, orthopedic surgery were consulted.  Patient admitted for further management.  Patient underwent placement of right IJ tunnel catheter.  Patient underwent hemodialysis 10/31/19.  He reports feeling slightly better, labs are improving.  He is getting replacement for electrolyte abnormalities. He is having HD today, Next HD 9/11. Neurology recommended aggressive treatment for resistant  hypocalcemia,  stroke work-up is essentially completed.  Once acute kidney injury and acute liver injury resolves,  Patient needs to be started on statins.  He has had a stroke likely from hypoperfusion event versus vasospasm from cocaine use. EMG nerve conduction studies in 4 to 6 weeks as outpatient.  Ortho consulted stated no concern for compartment syndrome.    Assessment & Plan:   Principal Problem:   Acute renal failure due to rhabdomyolysis [facility]) Active Problems:   Hyperkalemia   Transaminitis   High anion gap metabolic acidosis   Alcohol use   Substance use disorder   Acute cerebral infarction (HCC)   Hypotension   Crohn's disease (HCC)   Lactic acidosis   Rhabdomyolysis with acute renal failure /High anion gap metabolic acidosis/ Hypotensive on arrival. CK >50,000--> 22,988-->50,00 >> 23000 >>. 88110 Continue to trend CK. Cr worsened 8.42-->13.86- > 11.69 >> 13.86 Nephrology consulted, started on bicarb drip, due to minimal UO,   He underwent Hemodialysis 10/31/19, HD today, Next HD 9/11 Daily CMP  Hyperkalemia Resolved.  Hypocalcemia.  Repeat 1 dose of calcium gluconate 2 g Calcium 5.7, calcium gluconate 1 g given Daily CMP  Transaminitis: AST 4337, ALT 1851 on admission, trending down AST 1292 , ALT 1547  >> AST 584, RPR945 Multifactorial from rhabdomyolysis, hypoperfusion, alcohol use Normal acetaminophen level Daily CMP  Leukocytosis >> Afebrile, no signs of infection. Likely reactive from rhabdomyolysis in addition to hemoconcentration. Recheck CBC.  Right upper and lower extremity weakness /edema /pain/acute infarction of the right occipital lobe and right cerebellum/watershed stroke/globi pallidi MRI brain shows punctate acute infarcts in the right septal lobe and right cerebellum. Neurology consulted, right-sided weakness likely peripheral in nature (nerve palsy), may need outpatient  EMG if continues to be weak MRI cervical/lumbar spine fairly  unremarkable, normal MRA of the head and neck Echo : LVEF 55% Orthopedics consulted for possible developing compartment syndrome, likely acute neurologic palsy, recommend outpatient EMG if no improvement. No concerns for compartment syndrome. PT/OT Right am swollen,  Right arm venous duplex ruled out DVT.  Crohn's disease Follows with [facility], Dr. Havery Moros.  Has been on Remicade monotherapy.  Currently stable.  Hold Remicade.  Substance/alcohol abuse. Denies any prior history of withdrawal. UDS positive for cocaine, reports using Molly Continue CIWA, no standing Ativan at this time given multiple medical complications as above    DVT prophylaxis:  Heparin Code Status: Full Family Communication: Family was at bed side. Disposition Plan: . Dispo: The patient is from: Home  Anticipated d/c is to:  CIR   Anticipated d/c date is: 1-2 days  Patient currently is not medically stable to d/c.    Consultants:   Nephrology, Neurology, Orthopaedics.  Procedures:  Hemodialysis. Antimicrobials:  Anti-infectives (From admission, onward)   Start     Dose/Rate Route Frequency Ordered Stop   10/30/19 1737  ceFAZolin (ANCEF) 2-4 GM/100ML-% IVPB       Note to Pharmacy: Desiree Hane   : cabinet override      10/30/19 1737 10/30/19 1740      Subjective: Patient was seen and examined at bedside.  Overnight events noted. Patient still reports having right arm and right leg pain and swelling. Venous duplex right arm negative for DVT. Patient is going to have hemodialysis today.    Objective: Vitals:   11/02/19 1400 11/02/19 1430 11/02/19 1447 11/02/19 1623  BP: 96/70 94/67 (!) 101/58 110/67  Pulse: (!) 104 (!) 109 98 (!) 106  Resp:   16 18  Temp:   (!) 97.5 F (36.4 C) 97.9 F (36.6 C)  TempSrc:   Oral Oral  SpO2:   95% 93%  Weight:   104.1 kg   Height:        Intake/Output Summary (Last 24 hours) at 11/02/2019 1710 Last data filed  at 11/02/2019 1447 Gross per 24 hour  Intake 1440 ml  Output 1100 ml  Net 340 ml   Filed Weights   11/02/19 0411 11/02/19 1206 11/02/19 1447  Weight: 104.4 kg 105.6 kg 104.1 kg    Examination:  General exam: Appears calm and comfortable, bruise noted on right cheek. Respiratory system: Clear to auscultation. Respiratory effort normal. Cardiovascular system: S1 & S2 heard, RRR. No JVD, murmurs, rubs, gallops or clicks. No pedal edema. Gastrointestinal system: Abdomen is nondistended, soft and nontender. No organomegaly or masses felt. Normal bowel sounds heard. Central nervous system: Alert and oriented. No focal neurological deficits. Extremities: Right arm and leg swelling/ tenderness noted. Skin: No rashes, lesions or ulcers Psychiatry: Judgement and insight appear normal. Mood & affect appropriate.     Data Reviewed: I have personally reviewed following labs and imaging studies  CBC: Recent Labs  Lab 10/29/19 0314 10/29/19 0314 10/29/19 1811 10/30/19 0452 10/31/19 0634 11/01/19 0747 11/02/19 0400  WBC 18.5*   < > 14.9* 11.8* 12.4* 15.9* 24.0*  NEUTROABS 16.5*  --   --  10.2* 10.1* 12.3* 17.2*  HGB 13.1   < > 11.8* 10.9* 11.1* 12.3* 14.3  HCT 38.6*   < > 35.9* 32.5* 33.8* 36.7* 41.6  MCV 91.3   < > 90.9 90.3 90.9 90.0 90.0  PLT 137*   < > 125* 115* 126* 113* 136*   < > =  values in this interval not displayed.   Basic Metabolic Panel: Recent Labs  Lab 10/28/19 0649 10/28/19 1635 10/29/19 0314 10/29/19 0314 10/29/19 1811 10/30/19 0452 10/31/19 0634 11/01/19 0747 11/02/19 0400  NA 137   < > 132*   < > 132* 136 133* 135 131*  K 5.0   < > 4.6   < > 4.4 4.1 4.0 3.5 4.2  CL 106   < > 97*   < > 98 99 95* 99 96*  CO2 15*   < > 16*   < > 15* 17* 16* 18* 16*  GLUCOSE 143*   < > 114*   < > 110* 100* 97 120* 124*  BUN 46*   < > 63*   < > 80* 93* 130* 106* 127*  CREATININE 6.06*   < > 8.42*   < > 9.70* 11.09* 13.86* 11.69* 13.86*  CALCIUM 5.9*   < > 5.8*   < > 5.8*  5.4* 4.6* 5.4* 5.7*  MG  --   --  2.2  --   --   --   --  2.5* 2.5*  PHOS 10.1*  --   --   --  11.2*  --   --  9.2* 11.0*   < > = values in this interval not displayed.   GFR: Estimated Creatinine Clearance: 8 mL/min (A) (by C-G formula based on SCr of 13.86 mg/dL (H)). Liver Function Tests: Recent Labs  Lab 10/29/19 0314 10/29/19 0314 10/29/19 1811 10/30/19 0452 10/31/19 0634 11/01/19 0747 11/02/19 0400  AST 1,796*  --   --  2,731* 1,292* 584* 330*  ALT 1,133*  --   --  2,474* 1,547* 634* 287*  ALKPHOS 94  --   --  77 74 72 63  BILITOT 1.3*  --   --  1.2 0.9 0.6 0.9  PROT 5.0*  --   --  4.7* 5.2* 5.0* 4.9*  ALBUMIN 2.6*   < > 2.5* 2.3* 2.5* 2.3* 2.2*   < > = values in this interval not displayed.   No results for input(s): LIPASE, AMYLASE in the last 168 hours. No results for input(s): AMMONIA in the last 168 hours. Coagulation Profile: No results for input(s): INR, PROTIME in the last 168 hours. Cardiac Enzymes: Recent Labs  Lab 10/29/19 0314 10/30/19 0452 10/31/19 0634 11/01/19 0747 11/02/19 0400  CKTOTAL 22,988* >50,000* >50,000* 23,258* 56,387*   BNP (last 3 results) No results for input(s): PROBNP in the last 8760 hours. HbA1C: No results for input(s): HGBA1C in the last 72 hours. CBG: Recent Labs  Lab 10/27/19 1600 10/27/19 2259  GLUCAP 97 174*   Lipid Profile: No results for input(s): CHOL, HDL, LDLCALC, TRIG, CHOLHDL, LDLDIRECT in the last 72 hours. Thyroid Function Tests: No results for input(s): TSH, T4TOTAL, FREET4, T3FREE, THYROIDAB in the last 72 hours. Anemia Panel: No results for input(s): VITAMINB12, FOLATE, FERRITIN, TIBC, IRON, RETICCTPCT in the last 72 hours. Sepsis Labs: Recent Labs  Lab 10/27/19 1950 10/27/19 2249 10/28/19 0650  LATICACIDVEN 6.8* 5.4* 3.2*    Recent Results (from the past 240 hour(s))  Culture, blood (Routine X 2) w Reflex to ID Panel     Status: None   Collection Time: 10/27/19  8:43 PM   Specimen: BLOOD    Result Value Ref Range Status   Specimen Description   Final    BLOOD LEFT ANTECUBITAL Performed at [facility], [place] [address] [place], [place] [address]    Special Requests  Final    BOTTLES DRAWN AEROBIC AND ANAEROBIC Blood Culture results may not be optimal due to an excessive volume of blood received in culture bottles Performed at [facility] [address], [place], [place] [address]    Culture   Final    NO GROWTH 5 DAYS Performed at [facility], [place] [address], [place], [place] [address]    Report Status 11/02/2019 FINAL  Final  SARS Coronavirus 2 by RT PCR (hospital order, performed in [facility] hospital lab) Nasopharyngeal Nasopharyngeal Swab     Status: None   Collection Time: 10/27/19  8:43 PM   Specimen: Nasopharyngeal Swab  Result Value Ref Range Status   SARS Coronavirus 2 NEGATIVE NEGATIVE Final    Comment: (NOTE) SARS-CoV-2 target nucleic acids are NOT DETECTED.  The SARS-CoV-2 RNA is generally detectable in upper and lower respiratory specimens during the acute phase of infection. The lowest concentration of SARS-CoV-2 viral copies this assay can detect is 250 copies / mL. A negative result does not preclude SARS-CoV-2 infection and should not be used as the sole basis for treatment or other patient management decisions.  A negative result may occur with improper specimen collection / handling, submission of specimen other than nasopharyngeal swab, presence of viral mutation(s) within the areas targeted by this assay, and inadequate number of viral copies (<250 copies / mL). A negative result must be combined with clinical observations, patient history, and epidemiological information.  Fact Sheet for Patients:   StrictlyIdeas.no  Fact Sheet for Healthcare Providers: BankingDealers.co.za  This test is not yet approved or  cleared by the [place] FDA  and has been authorized for detection and/or diagnosis of SARS-CoV-2 by FDA under an Emergency Use Authorization (EUA).  This EUA will remain in effect (meaning this test can be used) for the duration of the COVID-19 declaration under Section 564(b)(1) of the Act, 21 U.S.C. section 360bbb-3(b)(1), unless the authorization is terminated or revoked sooner.  Performed at [facility], [place] [address], [place], [place] [address]   Culture, blood (Routine X 2) w Reflex to ID Panel     Status: None   Collection Time: 10/27/19  9:12 PM   Specimen: BLOOD  Result Value Ref Range Status   Specimen Description   Final    BLOOD RIGHT ANTECUBITAL Performed at [place] [facility] [address] [place], [place] [address]    Special Requests   Final    BOTTLES DRAWN AEROBIC AND ANAEROBIC Blood Culture adequate volume Performed at [facility] [address], [place], [place] [address]    Culture   Final    NO GROWTH 5 DAYS Performed at [facility], [place] [address] [place], [place] [address]    Report Status 11/02/2019 FINAL  Final  MRSA PCR Screening     Status: None   Collection Time: 10/29/19  1:44 PM   Specimen: Nasopharyngeal  Result Value Ref Range Status   MRSA by PCR NEGATIVE NEGATIVE Final    Comment:        The GeneXpert MRSA Assay (FDA approved for NASAL specimens only), is one component of a comprehensive MRSA colonization surveillance program. It is not intended to diagnose MRSA infection nor to guide or monitor treatment for MRSA infections. Performed at [facility], [place] [address], [place], [place] [address]      Radiology Studies: VAS [place] UPPER EXTREMITY VENOUS DUPLEX  Result Date: 11/02/2019 UPPER VENOUS  STUDY  Indications: Swelling, and Edema Risk Factors: Surgery Right CV Line 10/30/19. Performing Technologist: Griffin Basil RCT RDMS  Examination Guidelines: A complete evaluation includes B-mode  imaging, spectral Doppler, color Doppler, and power Doppler as needed of all accessible portions of each vessel. Bilateral testing is considered an integral part of a complete examination. Limited examinations for reoccurring indications may be performed as noted.  Right Findings: +----------+------------+---------+-----------+----------+-------+ RIGHT     CompressiblePhasicitySpontaneousPropertiesSummary +----------+------------+---------+-----------+----------+-------+ IJV           Full       Yes       Yes                      +----------+------------+---------+-----------+----------+-------+ Subclavian    Full       Yes       Yes                      +----------+------------+---------+-----------+----------+-------+ Axillary      Full       Yes       Yes                      +----------+------------+---------+-----------+----------+-------+ Brachial      Full       Yes       Yes                      +----------+------------+---------+-----------+----------+-------+ Radial        Full                                          +----------+------------+---------+-----------+----------+-------+ Ulnar         Full                                          +----------+------------+---------+-----------+----------+-------+ Cephalic      Full                                          +----------+------------+---------+-----------+----------+-------+ Basilic       Full                                          +----------+------------+---------+-----------+----------+-------+  Left Findings: +----------+------------+---------+-----------+----------+-------+ LEFT      CompressiblePhasicitySpontaneousPropertiesSummary +----------+------------+---------+-----------+----------+-------+ IJV           Full       Yes       Yes                      +----------+------------+---------+-----------+----------+-------+ Subclavian    Full       Yes       Yes                       +----------+------------+---------+-----------+----------+-------+  Summary:  Right: No evidence of deep vein thrombosis in the upper extremity. No evidence of superficial vein thrombosis in the upper extremity. No evidence of thrombosis in the subclavian.  Left: No evidence of thrombosis in the subclavian.  *See table(s) above  for measurements and observations.  Diagnosing physician: Monica Martinez MD Electronically signed by Monica Martinez MD on 11/02/2019 at 4:36:14 PM.    Final     Scheduled Meds: . aspirin EC  81 mg Oral Daily  . calcium carbonate  400 mg of elemental calcium Oral BID  . Chlorhexidine Gluconate Cloth  6 each Topical Daily  . Chlorhexidine Gluconate Cloth  6 each Topical Q0600  . heparin  5,000 Units Subcutaneous Q8H  . heparin sodium (porcine)       Continuous Infusions: . sodium chloride    . sodium chloride       LOS: 6 days    Time spent: 35 mins.    Shawna Clamp, MD [facility]   If 7PM-7AM, please contact night-coverage

## 2019-11-02 NOTE — Procedures (Signed)
Patient seen and examined on Hemodialysis. BP 107/73   Pulse (!) 101   Temp 97.6 F (36.4 C) (Oral)   Resp 18   Ht 5' 11"  (1.803 m)   Wt 105.6 kg   SpO2 96%   BMI 32.47 kg/m   QB 300 mL/ min via TDC, UF goal 1.5L  Tolerating treatment without complaints at this time.   Madelon Lips MD [facility] pgr [phone] 1:17 PM

## 2019-11-02 NOTE — Progress Notes (Signed)
Right Upper Ext. study completed.   See CVProc for preliminary results.   Griffin Basil, RDMS, RVT

## 2019-11-02 NOTE — Progress Notes (Addendum)
[facility] Progress Note  Subjective:   Seen on HD today.  Feeling OK.  R arm and leg still sore.    Vitals:   11/02/19 1206 11/02/19 1212 11/02/19 1230 11/02/19 1300  BP: 110/68 104/68 105/71 107/73  Pulse: 99 99 97 (!) 101  Resp: 18     Temp: 97.6 F (36.4 C)     TempSrc: Oral     SpO2: 96%     Weight: 105.6 kg     Height:        Exam: Gen NAD No jvd or bruits Chest clear bilat to bases RRR no MRG Abd soft ntnd no mass or ascites R leg and arm w/ edema edema, no edema on L side, R cheek abrasion Ext no wounds or ulcers Neuro is alert, Ox 3 , nf ACCESS: R IJ TDC    Home meds:  - remicade 400 IV every 8 wks  - ciagra 50 prn/ claritin 10 prn  - prn's/ vitamins/ supplements     UDS + cocaine     CT abd 9/04 > Adrenals/Urinary Tract: Both adrenal glands appear normal. The kidneys and collecting system appear normal without evidence of urinary tract calculus or hydronephrosis. Bladder is unremarkable. Otherwise >> IMPRESSION: small portion of transverse colon and fat containing anterior umbilical hernia, however no definite evidence bowel obstruction or strangulation. Moderate amount of colonic stool. Patchy streaky airspace opacity at the right lung base which may be due to atelectasis and/or infectious etiology.     MRI brain 9/04 > IMPRESSION: 1. Symmetric restricted diffusion in the globi pallidi. This is most often seen with an acute toxic or metabolic insult (including carbon monoxide poisoning and drugs of abuse) or hypoxic-ischemic injury. No hemorrhage. 2. Punctate acute infarcts in the R occipital lobe and R cerebellum.    CXR 10/27/19 - IMPRESSION: Low lung volumes with bibasilar atelectasis. No evidence of acute traumatic injury         Assessment/ Plan: 1. AKI - suspect rhabdomyolysis w/ swollen R leg/ arm, AKI and high K+ in pt who was unconscious on the ground for too long of a time. CPK > 50K.  AKI due to hypotension/ hypovolemia and  rhabdomyolysis. Had 4L bolus in ED on 9/3. Cr up to mid 8s--> 11.1 today, K better, but will need RRT.  Have d/w pt and wife. S/p TDC, HD #1 10/31/19.  Next HD today #2 9/10.  Making a small amount more of urine.  Not sure he'll be able to make it Friday--> Monday without RRT, will plan on short rx 9/11 too.      2. Rhabdomyolysis: d/t being unconscious on the floor for too long.  CK > 50,000.  LFTs coming down 3. Hypocalcemia: aggressive repletion with PO and IV, using 3.5 Ca bath in HD. 4. Hyperkalemia - K+ 7's on admit > 5.0--> 4.1.  Continue to follow closely.  Off lokelma 5. Drug abuse 6. H/o Crohn's - hx colostomy 2011, reversal in 2012, on [place] 11/02/2019, 1:13 PM   Recent Labs  Lab 11/01/19 0747 11/02/19 0400  K 3.5 4.2  BUN 106* 127*  CREATININE 11.69* 13.86*  CALCIUM 5.4* 5.7*  PHOS 9.2* 11.0*  HGB 12.3* 14.3   Inpatient medications: . aspirin EC  81 mg Oral Daily  . calcium carbonate  400 mg of elemental calcium Oral BID  . Chlorhexidine Gluconate Cloth  6 each Topical Daily  . Chlorhexidine Gluconate Cloth  6 each Topical  Q0600  . heparin  5,000 Units Subcutaneous Q8H   . sodium chloride    . sodium chloride     sodium chloride, sodium chloride, alteplase, heparin, HYDROmorphone (DILAUDID) injection, lidocaine (PF), lidocaine-prilocaine, ondansetron **OR** ondansetron (ZOFRAN) IV, pentafluoroprop-tetrafluoroeth, senna-docusate

## 2019-11-03 LAB — COMPREHENSIVE METABOLIC PANEL
ALT: 137 U/L — ABNORMAL HIGH (ref 0–44)
AST: 221 U/L — ABNORMAL HIGH (ref 15–41)
Albumin: 2.1 g/dL — ABNORMAL LOW (ref 3.5–5.0)
Alkaline Phosphatase: 54 U/L (ref 38–126)
Anion gap: 17 — ABNORMAL HIGH (ref 5–15)
BUN: 103 mg/dL — ABNORMAL HIGH (ref 6–20)
CO2: 17 mmol/L — ABNORMAL LOW (ref 22–32)
Calcium: 6.2 mg/dL — CL (ref 8.9–10.3)
Chloride: 96 mmol/L — ABNORMAL LOW (ref 98–111)
Creatinine, Ser: 11.81 mg/dL — ABNORMAL HIGH (ref 0.61–1.24)
GFR calc Af Amer: 5 mL/min — ABNORMAL LOW (ref >60)
GFR calc non Af Amer: 4 mL/min — ABNORMAL LOW (ref >60)
Glucose, Bld: 121 mg/dL — ABNORMAL HIGH (ref 70–99)
Potassium: 4.2 mmol/L (ref 3.5–5.1)
Sodium: 130 mmol/L — ABNORMAL LOW (ref 135–145)
Total Bilirubin: 0.9 mg/dL (ref 0.3–1.2)
Total Protein: 4.6 g/dL — ABNORMAL LOW (ref 6.5–8.1)

## 2019-11-03 LAB — CBC WITH DIFFERENTIAL/PLATELET
Abs Immature Granulocytes: 5.31 10*3/uL — ABNORMAL HIGH (ref 0.00–0.07)
Basophils Absolute: 0.1 10*3/uL (ref 0.0–0.1)
Basophils Relative: 0 %
Eosinophils Absolute: 0.4 10*3/uL (ref 0.0–0.5)
Eosinophils Relative: 2 %
HCT: 35.3 % — ABNORMAL LOW (ref 39.0–52.0)
Hemoglobin: 11.9 g/dL — ABNORMAL LOW (ref 13.0–17.0)
Immature Granulocytes: 23 %
Lymphocytes Relative: 11 %
Lymphs Abs: 2.6 10*3/uL (ref 0.7–4.0)
MCH: 30.4 pg (ref 26.0–34.0)
MCHC: 33.7 g/dL (ref 30.0–36.0)
MCV: 90.3 fL (ref 80.0–100.0)
Monocytes Absolute: 1.3 10*3/uL — ABNORMAL HIGH (ref 0.1–1.0)
Monocytes Relative: 6 %
Neutro Abs: 13.3 10*3/uL — ABNORMAL HIGH (ref 1.7–7.7)
Neutrophils Relative %: 58 %
Platelets: 122 10*3/uL — ABNORMAL LOW (ref 150–400)
RBC: 3.91 MIL/uL — ABNORMAL LOW (ref 4.22–5.81)
RDW: 14 % (ref 11.5–15.5)
WBC: 22.9 10*3/uL — ABNORMAL HIGH (ref 4.0–10.5)
nRBC: 0 % (ref 0.0–0.2)

## 2019-11-03 LAB — MAGNESIUM: Magnesium: 2.2 mg/dL (ref 1.7–2.4)

## 2019-11-03 LAB — CK: Total CK: 22220 U/L — ABNORMAL HIGH (ref 49–397)

## 2019-11-03 LAB — PHOSPHORUS: Phosphorus: 9.6 mg/dL — ABNORMAL HIGH (ref 2.5–4.6)

## 2019-11-03 MED ORDER — HEPARIN SODIUM (PORCINE) 1000 UNIT/ML IJ SOLN
INTRAMUSCULAR | Status: AC
  Administered 2019-11-03: 1000 [IU] via INTRAVENOUS_CENTRAL
  Filled 2019-11-03: qty 2

## 2019-11-03 MED ORDER — CALCIUM GLUCONATE-NACL 1-0.675 GM/50ML-% IV SOLN
1.0000 g | Freq: Once | INTRAVENOUS | Status: AC
  Administered 2019-11-03: 1000 mg via INTRAVENOUS
  Filled 2019-11-03: qty 50

## 2019-11-03 NOTE — Progress Notes (Signed)
PROGRESS NOTE    [name]  WER:[id number] DOB: 1970-11-26 DOA: 10/27/2019 PCP: Lavone Orn, MD    Brief Narrative:  [name] a 49 y.o.malewith medical history significant forCrohn's disease, PE not currently on anticoagulation, and depression who presents to the ED for evaluation ofright upper and lower extremity weakness and pain. Patient stated he was out drinking last night (10/26/2019) when he got intoxicated on alcohol, Molly, cocaine. He blacked out and did not recall the events occurring later that night. He apparently fell onto a brick floor kitchen at his friend's residence landing on the right side of his body and the right side of his face. He woke up, unable to move his right arm or leg and had significant pain throughout his right upper and lower extremities. He was brought to the ED by hissignificant other. ED Course: Initial vitals showed BP 63/40, pulse 115, RR 29,  SPO2 95% on room air. Labs significant for potassium >7.5, BUN 31, creatinine 5.26, bicarb 14, ALT 1851, anion gap 24, WBC 43.5, hemoglobin 19.3, hematocrit 56.4, platelets 276,000, serum ethanol elevated at 24, lactic acid 6.8, CK >50,000, UDS is positive for cocaine. CT head/maxillofacial/cervical spine without contrast were negative for acute intracranial abnormality. Portable chest x-ray unremarkable Right ankle and hip x-ray is negative for acute fracture or dislocation. MRI brain shows punctate acute infarcts in the right septal lobe and right cerebellum. CT abdomen/pelvis unremarkable.  Nephrology, Neurology, orthopedic surgery were consulted.  Patient admitted for further management.  Patient underwent placement of right IJ tunnel catheter.  Patient underwent hemodialysis 10/31/19.  He reports feeling slightly better, labs are improving.  He is getting replacement for electrolyte abnormalities. He is having HD today, HD  Session 3 Neurology recommended aggressive treatment for resistant  hypocalcemia,  stroke work-up is essentially completed.  Once acute kidney injury and acute liver injury resolves,  Patient needs to be started on statins.  He has had a stroke likely from hypoperfusion event versus vasospasm from cocaine use. EMG nerve conduction studies in 4 to 6 weeks as outpatient.  Ortho consulted stated no concern for compartment syndrome.    Assessment & Plan:   Principal Problem:   Acute renal failure due to rhabdomyolysis [place]) Active Problems:   Hyperkalemia   Transaminitis   High anion gap metabolic acidosis   Alcohol use   Substance use disorder   Acute cerebral infarction (HCC)   Hypotension   Crohn's disease (HCC)   Lactic acidosis   Rhabdomyolysis with acute renal failure /High anion gap metabolic acidosis/ Hypotensive on arrival. CK >50,000--> 22,988-->50,00 >> 23000 >>. 19509 ->22220 Continue to trend CK. Cr worsened 8.42-->13.86- > 11.69 >> 13.86 >>>11.81 Nephrology consulted, started on bicarb drip, due to minimal UO,   He underwent Hemodialysis 10/31/19, Next HD today Daily CMP  Hyperkalemia Resolved.  Hypocalcemia.  Repeat 1 dose of calcium gluconate 2 g Calcium 6.2, calcium gluconate 1 g given Daily CMP  Transaminitis: AST 4337, ALT 1851 on admission, trending down AST 1292 , ALT 1547  >> AST 584, [id number] Multifactorial from rhabdomyolysis, hypoperfusion, alcohol use Normal acetaminophen level Daily CMP  Leukocytosis >> Afebrile, no signs of infection. Likely reactive from rhabdomyolysis in addition to hemoconcentration. Recheck CBC.  Right upper and lower extremity weakness /edema /pain/acute infarction of the right occipital lobe and right cerebellum/watershed stroke/globi pallidi MRI brain shows punctate acute infarcts in the right septal lobe and right cerebellum. Neurology consulted, right-sided weakness likely peripheral in nature (nerve palsy), may need  outpatient EMG if continues to be weak MRI cervical/lumbar spine  fairly unremarkable, normal MRA of the head and neck Echo : LVEF 55% Orthopedics consulted for possible developing compartment syndrome, likely acute neurologic palsy, recommend outpatient EMG if no improvement. No concerns for compartment syndrome. PT/OT Right am swollen,  Right arm venous duplex ruled out DVT.  Crohn's disease Follows with [facility], Dr. Havery Moros.  Has been on Remicade monotherapy.  Currently stable.  Hold Remicade.  Substance/alcohol abuse. Denies any prior history of withdrawal. UDS positive for cocaine, reports using Molly Continue CIWA, no standing Ativan at this time given multiple medical complications as above    DVT prophylaxis:  Heparin Code Status: Full Family Communication: Family was at bed side. Disposition Plan: . Dispo: The patient is from: Home  Anticipated d/c is to:  [facility]   Anticipated d/c date is: 1-2 days  Patient currently is not medically stable to d/c.    Consultants:   Nephrology, Neurology, Orthopaedics.  Procedures:  Hemodialysis. Antimicrobials:  Anti-infectives (From admission, onward)   Start     Dose/Rate Route Frequency Ordered Stop   10/30/19 1737  ceFAZolin (ANCEF) 2-4 GM/100ML-% IVPB       Note to Pharmacy: Desiree Hane   : cabinet override      10/30/19 1737 10/30/19 1740      Subjective: Patient was seen and examined at bedside.  Overnight events noted. Patient still reports having right arm and right leg pain and swelling. Leg swelling getting better, Patient is going to have hemodialysis today.    Objective: Vitals:   11/03/19 1334 11/03/19 1337 11/03/19 1400 11/03/19 1430  BP: (!) 118/59 108/65 120/60 113/63  Pulse: (!) 104 (!) 108 (!) 101 (!) 101  Resp: 20 20 20 15   Temp: 98.2 F (36.8 C)     TempSrc: Oral     SpO2: 96%     Weight: 106.3 kg     Height:        Intake/Output Summary (Last 24 hours) at 11/03/2019 1510 Last data filed at 11/03/2019  0600 Gross per 24 hour  Intake 110 ml  Output 10 ml  Net 100 ml   Filed Weights   11/02/19 1447 11/03/19 0608 11/03/19 1334  Weight: 104.1 kg 103 kg 106.3 kg    Examination:  General exam: Appears calm and comfortable, bruise noted on right cheek. Respiratory system: Clear to auscultation. Respiratory effort normal. Cardiovascular system: S1 & S2 heard, RRR. No JVD, murmurs, rubs, gallops or clicks. No pedal edema. Gastrointestinal system: Abdomen is nondistended, soft and nontender. No organomegaly or masses felt. Normal bowel sounds heard. Central nervous system: Alert and oriented. No focal neurological deficits. Extremities: Right arm and leg swelling/ tenderness noted. Skin: No rashes, lesions or ulcers Psychiatry: Judgement and insight appear normal. Mood & affect appropriate.     Data Reviewed: I have personally reviewed following labs and imaging studies  CBC: Recent Labs  Lab 10/30/19 0452 10/31/19 0634 11/01/19 0747 11/02/19 0400 11/03/19 0434  WBC 11.8* 12.4* 15.9* 24.0* 22.9*  NEUTROABS 10.2* 10.1* 12.3* 17.2* 13.3*  HGB 10.9* 11.1* 12.3* 14.3 11.9*  HCT 32.5* 33.8* 36.7* 41.6 35.3*  MCV 90.3 90.9 90.0 90.0 90.3  PLT 115* 126* 113* 136* 657*   Basic Metabolic Panel: Recent Labs  Lab 10/28/19 0649 10/28/19 1635 10/29/19 0314 10/29/19 0314 10/29/19 1811 10/29/19 1811 10/30/19 0452 10/31/19 0634 11/01/19 0747 11/02/19 0400 11/03/19 0434  NA 137   < > 132*   < > 132*   < >  136 133* 135 131* 130*  K 5.0   < > 4.6   < > 4.4   < > 4.1 4.0 3.5 4.2 4.2  CL 106   < > 97*   < > 98   < > 99 95* 99 96* 96*  CO2 15*   < > 16*   < > 15*   < > 17* 16* 18* 16* 17*  GLUCOSE 143*   < > 114*   < > 110*   < > 100* 97 120* 124* 121*  BUN 46*   < > 63*   < > 80*   < > 93* 130* 106* 127* 103*  CREATININE 6.06*   < > 8.42*   < > 9.70*   < > 11.09* 13.86* 11.69* 13.86* 11.81*  CALCIUM 5.9*   < > 5.8*   < > 5.8*   < > 5.4* 4.6* 5.4* 5.7* 6.2*  MG  --   --  2.2  --   --    --   --   --  2.5* 2.5* 2.2  PHOS 10.1*  --   --   --  11.2*  --   --   --  9.2* 11.0* 9.6*   < > = values in this interval not displayed.   GFR: Estimated Creatinine Clearance: 9.5 mL/min (A) (by C-G formula based on SCr of 11.81 mg/dL (H)). Liver Function Tests: Recent Labs  Lab 10/30/19 0452 10/31/19 0634 11/01/19 0747 11/02/19 0400 11/03/19 0434  AST 2,731* 1,292* 584* 330* 221*  ALT 2,474* 1,547* 634* 287* 137*  ALKPHOS 77 74 72 63 54  BILITOT 1.2 0.9 0.6 0.9 0.9  PROT 4.7* 5.2* 5.0* 4.9* 4.6*  ALBUMIN 2.3* 2.5* 2.3* 2.2* 2.1*   No results for input(s): LIPASE, AMYLASE in the last 168 hours. No results for input(s): AMMONIA in the last 168 hours. Coagulation Profile: No results for input(s): INR, PROTIME in the last 168 hours. Cardiac Enzymes: Recent Labs  Lab 10/30/19 0452 10/31/19 0634 11/01/19 0747 11/02/19 0400 11/03/19 0434  CKTOTAL >50,000* >50,000* 23,258* 69,678* 22,220*   BNP (last 3 results) No results for input(s): PROBNP in the last 8760 hours. HbA1C: No results for input(s): HGBA1C in the last 72 hours. CBG: Recent Labs  Lab 10/27/19 1600 10/27/19 2259  GLUCAP 97 174*   Lipid Profile: No results for input(s): CHOL, HDL, LDLCALC, TRIG, CHOLHDL, LDLDIRECT in the last 72 hours. Thyroid Function Tests: No results for input(s): TSH, T4TOTAL, FREET4, T3FREE, THYROIDAB in the last 72 hours. Anemia Panel: No results for input(s): VITAMINB12, FOLATE, FERRITIN, TIBC, IRON, RETICCTPCT in the last 72 hours. Sepsis Labs: Recent Labs  Lab 10/27/19 1950 10/27/19 2249 10/28/19 0650  LATICACIDVEN 6.8* 5.4* 3.2*    Recent Results (from the past 240 hour(s))  Culture, blood (Routine X 2) w Reflex to ID Panel     Status: None   Collection Time: 10/27/19  8:43 PM   Specimen: BLOOD  Result Value Ref Range Status   Specimen Description   Final    BLOOD LEFT ANTECUBITAL Performed at [facility] [place] [address], [place], [place]  [address]    Special Requests   Final    BOTTLES DRAWN AEROBIC AND ANAEROBIC Blood Culture results may not be optimal due to an excessive volume of blood received in culture bottles Performed at [facility] [address], [place], [place] [address]    Culture   Final    NO  GROWTH 5 DAYS Performed at [facility], [place] [address] [place], [place] [address]    Report Status 11/02/2019 FINAL  Final  SARS Coronavirus 2 by RT PCR (hospital order, performed in [facility] hospital lab) Nasopharyngeal Nasopharyngeal Swab     Status: None   Collection Time: 10/27/19  8:43 PM   Specimen: Nasopharyngeal Swab  Result Value Ref Range Status   SARS Coronavirus 2 NEGATIVE NEGATIVE Final    Comment: (NOTE) SARS-CoV-2 target nucleic acids are NOT DETECTED.  The SARS-CoV-2 RNA is generally detectable in upper and lower respiratory specimens during the acute phase of infection. The lowest concentration of SARS-CoV-2 viral copies this assay can detect is 250 copies / mL. A negative result does not preclude SARS-CoV-2 infection and should not be used as the sole basis for treatment or other patient management decisions.  A negative result may occur with improper specimen collection / handling, submission of specimen other than nasopharyngeal swab, presence of viral mutation(s) within the areas targeted by this assay, and inadequate number of viral copies (<250 copies / mL). A negative result must be combined with clinical observations, patient history, and epidemiological information.  Fact Sheet for Patients:   StrictlyIdeas.no  Fact Sheet for Healthcare Providers: BankingDealers.co.za  This test is not yet approved or  cleared by the [place] FDA and has been authorized for detection and/or diagnosis of SARS-CoV-2 by FDA under an Emergency Use Authorization (EUA).  This EUA will remain in effect (meaning this test can be  used) for the duration of the COVID-19 declaration under Section 564(b)(1) of the Act, 21 U.S.C. section 360bbb-3(b)(1), unless the authorization is terminated or revoked sooner.  Performed at [facility], [place] [address], [place], [place] [address]   Culture, blood (Routine X 2) w Reflex to ID Panel     Status: None   Collection Time: 10/27/19  9:12 PM   Specimen: BLOOD  Result Value Ref Range Status   Specimen Description   Final    BLOOD RIGHT ANTECUBITAL Performed at [facility] [address] [place], [place] [address]    Special Requests   Final    BOTTLES DRAWN AEROBIC AND ANAEROBIC Blood Culture adequate volume Performed at [facility] [address], [place], [place] [address]    Culture   Final    NO GROWTH 5 DAYS Performed at [facility], [place] [address] [place], [place] [address]    Report Status 11/02/2019 FINAL  Final  MRSA PCR Screening     Status: None   Collection Time: 10/29/19  1:44 PM   Specimen: Nasopharyngeal  Result Value Ref Range Status   MRSA by PCR NEGATIVE NEGATIVE Final    Comment:        The GeneXpert MRSA Assay (FDA approved for NASAL specimens only), is one component of a comprehensive MRSA colonization surveillance program. It is not intended to diagnose MRSA infection nor to guide or monitor treatment for MRSA infections. Performed at [facility], [place] [address], [place], [place] [address]      Radiology Studies: VAS [place] UPPER EXTREMITY VENOUS DUPLEX  Result Date: 11/02/2019 UPPER VENOUS STUDY  Indications: Swelling, and Edema Risk Factors: Surgery Right CV Line 10/30/19. Performing Technologist: Griffin Basil RCT RDMS  Examination Guidelines: A complete evaluation includes B-mode imaging, spectral Doppler, color Doppler, and power Doppler as needed of all accessible portions of each vessel. Bilateral testing is considered an integral part  of a complete  examination. Limited examinations for reoccurring indications may be performed as noted.  Right Findings: +----------+------------+---------+-----------+----------+-------+ RIGHT     CompressiblePhasicitySpontaneousPropertiesSummary +----------+------------+---------+-----------+----------+-------+ IJV           Full       Yes       Yes                      +----------+------------+---------+-----------+----------+-------+ Subclavian    Full       Yes       Yes                      +----------+------------+---------+-----------+----------+-------+ Axillary      Full       Yes       Yes                      +----------+------------+---------+-----------+----------+-------+ Brachial      Full       Yes       Yes                      +----------+------------+---------+-----------+----------+-------+ Radial        Full                                          +----------+------------+---------+-----------+----------+-------+ Ulnar         Full                                          +----------+------------+---------+-----------+----------+-------+ Cephalic      Full                                          +----------+------------+---------+-----------+----------+-------+ Basilic       Full                                          +----------+------------+---------+-----------+----------+-------+  Left Findings: +----------+------------+---------+-----------+----------+-------+ LEFT      CompressiblePhasicitySpontaneousPropertiesSummary +----------+------------+---------+-----------+----------+-------+ IJV           Full       Yes       Yes                      +----------+------------+---------+-----------+----------+-------+ Subclavian    Full       Yes       Yes                      +----------+------------+---------+-----------+----------+-------+  Summary:  Right: No evidence of deep vein thrombosis in the upper extremity. No  evidence of superficial vein thrombosis in the upper extremity. No evidence of thrombosis in the subclavian.  Left: No evidence of thrombosis in the subclavian.  *See table(s) above for measurements and observations.  Diagnosing physician: Monica Martinez MD Electronically signed by Monica Martinez MD on 11/02/2019 at 4:36:14 PM.    Final     Scheduled Meds: . aspirin EC  81 mg Oral Daily  . calcium carbonate  400 mg of elemental calcium Oral BID  .  Chlorhexidine Gluconate Cloth  6 each Topical Daily  . Chlorhexidine Gluconate Cloth  6 each Topical Q0600  . heparin  5,000 Units Subcutaneous Q8H  . heparin sodium (porcine)      . sodium chloride flush  10-40 mL Intracatheter Q12H   Continuous Infusions: . sodium chloride    . sodium chloride       LOS: 7 days    Time spent: 25 mins.    Shawna Clamp, MD [facility]   If 7PM-7AM, please contact night-coverage

## 2019-11-03 NOTE — Progress Notes (Signed)
[facility] Progress Note  Subjective:   For dialysis today again.  No complaints.    Vitals:   11/02/19 1938 11/03/19 0035 11/03/19 0608 11/03/19 1224  BP: 108/74 110/77 112/68 114/78  Pulse: (!) 102 99 (!) 102 99  Resp: 18 20 18 20   Temp: 98 F (36.7 C) 97.8 F (36.6 C) 98.7 F (37.1 C) 98.6 F (37 C)  TempSrc: Oral Oral Oral Oral  SpO2: 99% 99% 98% 95%  Weight:   103 kg   Height:        Exam: Gen NAD No jvd or bruits Chest clear bilat to bases RRR no MRG Abd soft ntnd no mass or ascites R leg and arm w/ edema edema, no edema on L side, R cheek abrasion Ext no wounds or ulcers Neuro is alert, Ox 3 , nf ACCESS: R IJ TDC    Home meds:  - remicade 400 IV every 8 wks  - ciagra 50 prn/ claritin 10 prn  - prn's/ vitamins/ supplements     UDS + cocaine     CT abd 9/04 > Adrenals/Urinary Tract: Both adrenal glands appear normal. The kidneys and collecting system appear normal without evidence of urinary tract calculus or hydronephrosis. Bladder is unremarkable. Otherwise >> IMPRESSION: small portion of transverse colon and fat containing anterior umbilical hernia, however no definite evidence bowel obstruction or strangulation. Moderate amount of colonic stool. Patchy streaky airspace opacity at the right lung base which may be due to atelectasis and/or infectious etiology.     MRI brain 9/04 > IMPRESSION: 1. Symmetric restricted diffusion in the globi pallidi. This is most often seen with an acute toxic or metabolic insult (including carbon monoxide poisoning and drugs of abuse) or hypoxic-ischemic injury. No hemorrhage. 2. Punctate acute infarcts in the R occipital lobe and R cerebellum.    CXR 10/27/19 - IMPRESSION: Low lung volumes with bibasilar atelectasis. No evidence of acute traumatic injury         Assessment/ Plan: 1. AKI - suspect rhabdomyolysis w/ swollen R leg/ arm, AKI and high K+ in pt who was unconscious on the ground for too long of a time.  CPK > 50K.  AKI due to hypotension/ hypovolemia and rhabdomyolysis. Had 4L bolus in ED on 9/3. Cr up to mid 8s--> 11.1 today, K better, but will need RRT.  Have d/w pt and wife. S/p TDC, HD #1 10/31/19.  Next HD #2 9/10.  HD #3 9/11 for volume and clearance.  Making minimal urine.       2. Rhabdomyolysis: d/t being unconscious on the floor for too long.  CK > 50,000.  LFTs coming down 3. Hypocalcemia: aggressive repletion with PO and IV, using 3.5 Ca bath in HD. 4. Hyperkalemia - K+ 7's on admit > 5.0--> 4.1.  Continue to follow closely.  Off lokelma 5. Drug abuse 6. H/o Crohn's - hx colostomy 2011, reversal in 2012, on [place] 11/03/2019, 12:55 PM   Recent Labs  Lab 11/02/19 0400 11/03/19 0434  K 4.2 4.2  BUN 127* 103*  CREATININE 13.86* 11.81*  CALCIUM 5.7* 6.2*  PHOS 11.0* 9.6*  HGB 14.3 11.9*   Inpatient medications: . aspirin EC  81 mg Oral Daily  . calcium carbonate  400 mg of elemental calcium Oral BID  . Chlorhexidine Gluconate Cloth  6 each Topical Daily  . Chlorhexidine Gluconate Cloth  6 each Topical Q0600  . heparin  5,000 Units Subcutaneous Q8H  . sodium chloride  flush  10-40 mL Intracatheter Q12H   . sodium chloride    . sodium chloride     sodium chloride, sodium chloride, alteplase, heparin, HYDROmorphone (DILAUDID) injection, lidocaine (PF), lidocaine-prilocaine, ondansetron **OR** ondansetron (ZOFRAN) IV, pentafluoroprop-tetrafluoroeth, senna-docusate, sodium chloride flush

## 2019-11-03 NOTE — Progress Notes (Addendum)
CRITICAL VALUE ALERT  Critical Value:  Calcium 6.2  Date & Time Notfiied:  11/03/19  Provider Notified: Konrad Felix  Orders Received/Actions taken: IV calcium gluconate 1g/50 mL @ 50 mL/h ordered & infusing; will  continue to monitor

## 2019-11-04 LAB — COMPREHENSIVE METABOLIC PANEL
ALT: 85 U/L — ABNORMAL HIGH (ref 0–44)
AST: 162 U/L — ABNORMAL HIGH (ref 15–41)
Albumin: 2.2 g/dL — ABNORMAL LOW (ref 3.5–5.0)
Alkaline Phosphatase: 47 U/L (ref 38–126)
Anion gap: 16 — ABNORMAL HIGH (ref 5–15)
BUN: 80 mg/dL — ABNORMAL HIGH (ref 6–20)
CO2: 20 mmol/L — ABNORMAL LOW (ref 22–32)
Calcium: 6.5 mg/dL — ABNORMAL LOW (ref 8.9–10.3)
Chloride: 95 mmol/L — ABNORMAL LOW (ref 98–111)
Creatinine, Ser: 9.9 mg/dL — ABNORMAL HIGH (ref 0.61–1.24)
GFR calc Af Amer: 6 mL/min — ABNORMAL LOW (ref >60)
GFR calc non Af Amer: 6 mL/min — ABNORMAL LOW (ref >60)
Glucose, Bld: 119 mg/dL — ABNORMAL HIGH (ref 70–99)
Potassium: 3.9 mmol/L (ref 3.5–5.1)
Sodium: 131 mmol/L — ABNORMAL LOW (ref 135–145)
Total Bilirubin: 0.6 mg/dL (ref 0.3–1.2)
Total Protein: 4.5 g/dL — ABNORMAL LOW (ref 6.5–8.1)

## 2019-11-04 LAB — CBC WITH DIFFERENTIAL/PLATELET
Abs Immature Granulocytes: 5.37 10*3/uL — ABNORMAL HIGH (ref 0.00–0.07)
Basophils Absolute: 0 10*3/uL (ref 0.0–0.1)
Basophils Relative: 0 %
Eosinophils Absolute: 0.4 10*3/uL (ref 0.0–0.5)
Eosinophils Relative: 2 %
HCT: 32.3 % — ABNORMAL LOW (ref 39.0–52.0)
Hemoglobin: 10.7 g/dL — ABNORMAL LOW (ref 13.0–17.0)
Immature Granulocytes: 25 %
Lymphocytes Relative: 10 %
Lymphs Abs: 2.2 10*3/uL (ref 0.7–4.0)
MCH: 30.1 pg (ref 26.0–34.0)
MCHC: 33.1 g/dL (ref 30.0–36.0)
MCV: 90.7 fL (ref 80.0–100.0)
Monocytes Absolute: 1.3 10*3/uL — ABNORMAL HIGH (ref 0.1–1.0)
Monocytes Relative: 6 %
Neutro Abs: 11.8 10*3/uL — ABNORMAL HIGH (ref 1.7–7.7)
Neutrophils Relative %: 57 %
Platelets: 132 10*3/uL — ABNORMAL LOW (ref 150–400)
RBC: 3.56 MIL/uL — ABNORMAL LOW (ref 4.22–5.81)
RDW: 14.4 % (ref 11.5–15.5)
WBC: 21.1 10*3/uL — ABNORMAL HIGH (ref 4.0–10.5)
nRBC: 0 % (ref 0.0–0.2)

## 2019-11-04 LAB — CK: Total CK: 13538 U/L — ABNORMAL HIGH (ref 49–397)

## 2019-11-04 LAB — PHOSPHORUS: Phosphorus: 7.8 mg/dL — ABNORMAL HIGH (ref 2.5–4.6)

## 2019-11-04 LAB — MAGNESIUM: Magnesium: 2.2 mg/dL (ref 1.7–2.4)

## 2019-11-04 MED ORDER — CALCIUM GLUCONATE-NACL 2-0.675 GM/100ML-% IV SOLN
2.0000 g | Freq: Once | INTRAVENOUS | Status: AC
  Administered 2019-11-04: 2000 mg via INTRAVENOUS
  Filled 2019-11-04: qty 100

## 2019-11-04 NOTE — Progress Notes (Signed)
[facility] Progress Note  Subjective:  HD went well yesterday, for HD again Monday  Vitals:   11/03/19 1638 11/03/19 1947 11/04/19 0015 11/04/19 0500  BP: 111/63 125/70 130/68 132/61  Pulse: (!) 103 (!) 102 (!) 102 98  Resp: 20 18 18 20   Temp: 97.9 F (36.6 C) 98 F (36.7 C) 98.8 F (37.1 C) 98.7 F (37.1 C)  TempSrc: Oral Oral Oral Oral  SpO2: 96% 98% 97% 99%  Weight: 105 kg   105.1 kg  Height:        Exam: Gen NAD No jvd or bruits Chest clear bilat to bases RRR no MRG Abd soft ntnd no mass or ascites R leg and arm w/ edema , no edema on L side, R cheek abrasion Ext no wounds or ulcers Neuro is alert, Ox 3 , nf ACCESS: R IJ TDC    Home meds:  - remicade 400 IV every 8 wks  - ciagra 50 prn/ claritin 10 prn  - prn's/ vitamins/ supplements     UDS + cocaine     CT abd 9/04 > Adrenals/Urinary Tract: Both adrenal glands appear normal. The kidneys and collecting system appear normal without evidence of urinary tract calculus or hydronephrosis. Bladder is unremarkable. Otherwise >> IMPRESSION: small portion of transverse colon and fat containing anterior umbilical hernia, however no definite evidence bowel obstruction or strangulation. Moderate amount of colonic stool. Patchy streaky airspace opacity at the right lung base which may be due to atelectasis and/or infectious etiology.     MRI brain 9/04 > IMPRESSION: 1. Symmetric restricted diffusion in the globi pallidi. This is most often seen with an acute toxic or metabolic insult (including carbon monoxide poisoning and drugs of abuse) or hypoxic-ischemic injury. No hemorrhage. 2. Punctate acute infarcts in the R occipital lobe and R cerebellum.    CXR 10/27/19 - IMPRESSION: Low lung volumes with bibasilar atelectasis. No evidence of acute traumatic injury         Assessment/ Plan: 1. AKI - suspect rhabdomyolysis w/ swollen R leg/ arm, AKI and high K+ in pt who was unconscious on the ground for too long of  a time. CPK > 50K.  AKI due to hypotension/ hypovolemia and rhabdomyolysis. Had 4L bolus in ED on 9/3. Cr up to mid 8s--> 11.1 today, K better, but will need RRT.  Have d/w pt and wife. S/p TDC, HD #1 10/31/19.  Next HD #2 9/10.  HD #3 9/11 for volume and clearance.  Making minimal urine.  Next HD planned 9/13.      2. Rhabdomyolysis: d/t being unconscious on the floor for too long.  CK > 50,000.  LFTs coming down 3. Hypocalcemia: aggressive repletion with PO and IV, using 3.5 Ca bath in HD. 4. Hyperkalemia - K+ 7's on admit > 5.0--> 4.1.  Continue to follow closely.  Off lokelma 5. Drug abuse 6. H/o Crohn's - hx colostomy 2011, reversal in 2012, on [place] 11/04/2019, 12:13 PM   Recent Labs  Lab 11/03/19 0434 11/04/19 0504  K 4.2 3.9  BUN 103* 80*  CREATININE 11.81* 9.90*  CALCIUM 6.2* 6.5*  PHOS 9.6* 7.8*  HGB 11.9* 10.7*   Inpatient medications: . aspirin EC  81 mg Oral Daily  . calcium carbonate  400 mg of elemental calcium Oral BID  . Chlorhexidine Gluconate Cloth  6 each Topical Daily  . heparin  5,000 Units Subcutaneous Q8H   . sodium chloride    . sodium  chloride     sodium chloride, sodium chloride, alteplase, heparin, HYDROmorphone (DILAUDID) injection, lidocaine (PF), lidocaine-prilocaine, ondansetron **OR** ondansetron (ZOFRAN) IV, pentafluoroprop-tetrafluoroeth, senna-docusate

## 2019-11-04 NOTE — Progress Notes (Signed)
PROGRESS NOTE    [name]  GNF:[id number] DOB: [birth year] DOA: 10/27/2019 PCP: Lavone Orn, MD    Brief Narrative:  [name] a 49 y.o.malewith medical history significant forCrohn's disease, PE not currently on anticoagulation, and depression who presents to the ED for evaluation ofright upper and lower extremity weakness and pain. Patient stated he was out drinking last night (10/26/2019) when he got intoxicated on alcohol, Molly, cocaine. He blacked out and did not recall the events occurring later that night. He apparently fell onto a brick floor kitchen at his friend's residence landing on the right side of his body and the right side of his face. He woke up, unable to move his right arm or leg and had significant pain throughout his right upper and lower extremities. He was brought to the ED by hissignificant other. ED Course: Initial vitals showed BP 63/40, pulse 115, RR 29,  SPO2 95% on room air. Labs significant for potassium >7.5, BUN 31, creatinine 5.26, bicarb 14, ALT 1851, anion gap 24, WBC 43.5, hemoglobin 19.3, hematocrit 56.4, platelets 276,000, serum ethanol elevated at 24, lactic acid 6.8, CK >50,000, UDS is positive for cocaine. CT head/maxillofacial/cervical spine without contrast were negative for acute intracranial abnormality. Portable chest x-ray unremarkable Right ankle and hip x-ray is negative for acute fracture or dislocation. MRI brain shows punctate acute infarcts in the right septal lobe and right cerebellum. CT abdomen/pelvis unremarkable.  Nephrology, Neurology, orthopedic surgery were consulted.  Patient admitted for further management.  Patient underwent placement of right IJ tunnel catheter.  Patient underwent hemodialysis 10/31/19.  He reports feeling slightly better, labs are improving.  He is getting replacement for electrolyte abnormalities. He has had HD x 3 , Next HD 9/13. Neurology recommended aggressive treatment for resistant  hypocalcemia,  stroke work-up is essentially completed.  Once acute kidney injury and acute liver injury resolves,  Patient needs to be started on statins.  He has had a stroke likely from hypoperfusion event versus vasospasm from cocaine use. EMG nerve conduction studies in 4 to 6 weeks as outpatient.  Ortho consulted stated no concern for compartment syndrome.  Assessment & Plan:   Principal Problem:   Acute renal failure due to rhabdomyolysis [facility]) Active Problems:   Hyperkalemia   Transaminitis   High anion gap metabolic acidosis   Alcohol use   Substance use disorder   Acute cerebral infarction (HCC)   Hypotension   Crohn's disease (HCC)   Lactic acidosis   Rhabdomyolysis with acute renal failure /High anion gap metabolic acidosis/ Hypotensive on arrival. CK >50,000--> 22,988-->50,00 >> 23000 >>. 84696 ->22220> 13538 Continue to trend CK. Cr worsened 8.42-->13.86- > 11.69 >> 13.86 >>>11.81 > 9.90 Nephrology consulted, started on bicarb drip, due to minimal UO,   He underwent Hemodialysis X 3 , Next HD  9/13. Daily CMP  Hyperkalemia Resolved.  Hypocalcemia.  Repeat 1 dose of calcium gluconate 2 g Calcium 6.5 today. Daily CMP  Transaminitis: AST 4337, ALT 1851 on admission, trending down AST 1292 , ALT 1547  >> AST 584, ALT634 Multifactorial from rhabdomyolysis, hypoperfusion, alcohol use Normal acetaminophen level Daily CMP  Leukocytosis >> Afebrile, no signs of infection. Likely reactive from rhabdomyolysis in addition to hemoconcentration. Recheck CBC.  Right upper and lower extremity weakness /edema /pain/acute infarction of the right occipital lobe and right cerebellum/watershed stroke/globi pallidi MRI brain shows punctate acute infarcts in the right septal lobe and right cerebellum. Neurology consulted, right-sided weakness likely peripheral in nature (nerve palsy), may  need outpatient EMG if continues to be weak MRI cervical/lumbar spine fairly  unremarkable, normal MRA of the head and neck Echo : LVEF 55% Orthopedics consulted for possible developing compartment syndrome, likely acute neurologic palsy, recommend outpatient EMG if no improvement. No concerns for compartment syndrome. PT/OT Right am swollen,  Right arm venous duplex ruled out DVT.  Crohn's disease Follows with [facility], Dr. Havery Moros.  Has been on Remicade monotherapy.  Currently stable.  Hold Remicade.  Substance/alcohol abuse. Denies any prior history of withdrawal. UDS positive for cocaine, reports using Molly Continue CIWA, no standing Ativan at this time given multiple medical complications as above    DVT prophylaxis:  Heparin Code Status: Full Family Communication: Family was at bed side. Disposition Plan: . Dispo: The patient is from: Home  Anticipated d/c is to:  CIR   Anticipated d/c date is: 1-2 days  Patient currently is not medically stable to d/c.    Consultants:   Nephrology, Neurology, Orthopaedics.  Procedures:  Hemodialysis. Antimicrobials:  Anti-infectives (From admission, onward)   Start     Dose/Rate Route Frequency Ordered Stop   10/30/19 1737  ceFAZolin (ANCEF) 2-4 GM/100ML-% IVPB       Note to Pharmacy: Desiree Hane   : cabinet override      10/30/19 1737 10/30/19 1740      Subjective: Patient was seen and examined at bedside.  Overnight events noted.  Patient still complains of right arm and right leg soreness.  He reports feeling much better denies any chest pain.  He is sitting on the chair,  participated in physical therapy.  Objective: Vitals:   11/03/19 1947 11/04/19 0015 11/04/19 0500 11/04/19 1300  BP: 125/70 130/68 132/61 126/68  Pulse: (!) 102 (!) 102 98 (!) 101  Resp: 18 18 20 19   Temp: 98 F (36.7 C) 98.8 F (37.1 C) 98.7 F (37.1 C) 98.4 F (36.9 C)  TempSrc: Oral Oral Oral Oral  SpO2: 98% 97% 99% 97%  Weight:   105.1 kg   Height:         Intake/Output Summary (Last 24 hours) at 11/04/2019 1546 Last data filed at 11/04/2019 0930 Gross per 24 hour  Intake 730 ml  Output 1525 ml  Net -795 ml   Filed Weights   11/03/19 1334 11/03/19 1638 11/04/19 0500  Weight: 106.3 kg 105 kg 105.1 kg    Examination:  General exam: Appears calm and comfortable, bruise noted on right cheek. Respiratory system: Clear to auscultation. Respiratory effort normal. Cardiovascular system: S1 & S2 heard, RRR. No JVD, murmurs, rubs, gallops or clicks. No pedal edema. Gastrointestinal system: Abdomen is nondistended, soft and nontender. No organomegaly or masses felt.  Normal bowel sounds heard. Central nervous system: Alert and oriented. No focal neurological deficits. Extremities: Right arm and leg swelling/ tenderness noted. ROM : improved. Skin: No rashes, lesions or ulcers Psychiatry: Judgement and insight appear normal. Mood & affect appropriate.     Data Reviewed: I have personally reviewed following labs and imaging studies  CBC: Recent Labs  Lab 10/31/19 0634 11/01/19 0747 11/02/19 0400 11/03/19 0434 11/04/19 0504  WBC 12.4* 15.9* 24.0* 22.9* 21.1*  NEUTROABS 10.1* 12.3* 17.2* 13.3* 11.8*  HGB 11.1* 12.3* 14.3 11.9* 10.7*  HCT 33.8* 36.7* 41.6 35.3* 32.3*  MCV 90.9 90.0 90.0 90.3 90.7  PLT 126* 113* 136* 122* 295*   Basic Metabolic Panel: Recent Labs  Lab 10/29/19 0314 10/29/19 0314 10/29/19 1811 10/30/19 0452 10/31/19 0634 11/01/19 0747 11/02/19 0400 11/03/19  9892 11/04/19 0504  NA 132*   < > 132*   < > 133* 135 131* 130* 131*  K 4.6   < > 4.4   < > 4.0 3.5 4.2 4.2 3.9  CL 97*   < > 98   < > 95* 99 96* 96* 95*  CO2 16*   < > 15*   < > 16* 18* 16* 17* 20*  GLUCOSE 114*   < > 110*   < > 97 120* 124* 121* 119*  BUN 63*   < > 80*   < > 130* 106* 127* 103* 80*  CREATININE 8.42*   < > 9.70*   < > 13.86* 11.69* 13.86* 11.81* 9.90*  CALCIUM 5.8*   < > 5.8*   < > 4.6* 5.4* 5.7* 6.2* 6.5*  MG 2.2  --   --   --   --   2.5* 2.5* 2.2 2.2  PHOS  --   --  11.2*  --   --  9.2* 11.0* 9.6* 7.8*   < > = values in this interval not displayed.   GFR: Estimated Creatinine Clearance: 11.3 mL/min (A) (by C-G formula based on SCr of 9.9 mg/dL (H)). Liver Function Tests: Recent Labs  Lab 10/31/19 0634 11/01/19 0747 11/02/19 0400 11/03/19 0434 11/04/19 0504  AST 1,292* 584* 330* 221* 162*  ALT 1,547* 634* 287* 137* 85*  ALKPHOS 74 72 63 54 47  BILITOT 0.9 0.6 0.9 0.9 0.6  PROT 5.2* 5.0* 4.9* 4.6* 4.5*  ALBUMIN 2.5* 2.3* 2.2* 2.1* 2.2*   No results for input(s): LIPASE, AMYLASE in the last 168 hours. No results for input(s): AMMONIA in the last 168 hours. Coagulation Profile: No results for input(s): INR, PROTIME in the last 168 hours. Cardiac Enzymes: Recent Labs  Lab 10/31/19 0634 11/01/19 0747 11/02/19 0400 11/03/19 0434 11/04/19 0504  CKTOTAL >50,000* 23,258* 11,941* 22,220* 13,538*   BNP (last 3 results) No results for input(s): PROBNP in the last 8760 hours. HbA1C: No results for input(s): HGBA1C in the last 72 hours. CBG: No results for input(s): GLUCAP in the last 168 hours. Lipid Profile: No results for input(s): CHOL, HDL, LDLCALC, TRIG, CHOLHDL, LDLDIRECT in the last 72 hours. Thyroid Function Tests: No results for input(s): TSH, T4TOTAL, FREET4, T3FREE, THYROIDAB in the last 72 hours. Anemia Panel: No results for input(s): VITAMINB12, FOLATE, FERRITIN, TIBC, IRON, RETICCTPCT in the last 72 hours. Sepsis Labs: No results for input(s): PROCALCITON, LATICACIDVEN in the last 168 hours.  Recent Results (from the past 240 hour(s))  Culture, blood (Routine X 2) w Reflex to ID Panel     Status: None   Collection Time: 10/27/19  8:43 PM   Specimen: BLOOD  Result Value Ref Range Status   Specimen Description   Final    BLOOD LEFT ANTECUBITAL Performed at [facility] [address], [place], [place] [address]    Special Requests   Final    BOTTLES DRAWN AEROBIC AND  ANAEROBIC Blood Culture results may not be optimal due to an excessive volume of blood received in culture bottles Performed at [facility] [address], [place], [place] [address]    Culture   Final    NO GROWTH 5 DAYS Performed at [facility], [place] [address] [place], [place] [address]    Report Status 11/02/2019 FINAL  Final  SARS Coronavirus 2 by RT PCR (hospital order, performed in [facility] hospital lab) Nasopharyngeal Nasopharyngeal Swab  Status: None   Collection Time: 10/27/19  8:43 PM   Specimen: Nasopharyngeal Swab  Result Value Ref Range Status   SARS Coronavirus 2 NEGATIVE NEGATIVE Final    Comment: (NOTE) SARS-CoV-2 target nucleic acids are NOT DETECTED.  The SARS-CoV-2 RNA is generally detectable in upper and lower respiratory specimens during the acute phase of infection. The lowest concentration of SARS-CoV-2 viral copies this assay can detect is 250 copies / mL. A negative result does not preclude SARS-CoV-2 infection and should not be used as the sole basis for treatment or other patient management decisions.  A negative result may occur with improper specimen collection / handling, submission of specimen other than nasopharyngeal swab, presence of viral mutation(s) within the areas targeted by this assay, and inadequate number of viral copies (<250 copies / mL). A negative result must be combined with clinical observations, patient history, and epidemiological information.  Fact Sheet for Patients:   StrictlyIdeas.no  Fact Sheet for Healthcare Providers: BankingDealers.co.za  This test is not yet approved or  cleared by the [place] FDA and has been authorized for detection and/or diagnosis of SARS-CoV-2 by FDA under an Emergency Use Authorization (EUA).  This EUA will remain in effect (meaning this test can be used) for the duration of the COVID-19 declaration under Section  564(b)(1) of the Act, 21 U.S.C. section 360bbb-3(b)(1), unless the authorization is terminated or revoked sooner.  Performed at [facility], [place] [address] [place], [place] [address]   Culture, blood (Routine X 2) w Reflex to ID Panel     Status: None   Collection Time: 10/27/19  9:12 PM   Specimen: BLOOD  Result Value Ref Range Status   Specimen Description   Final    BLOOD RIGHT ANTECUBITAL Performed at [facility] [address], [place], [place] [address]    Special Requests   Final    BOTTLES DRAWN AEROBIC AND ANAEROBIC Blood Culture adequate volume Performed at [facility] [address], [place], [place] [address]    Culture   Final    NO GROWTH 5 DAYS Performed at [facility], [place] [address] [place], [place] [address]    Report Status 11/02/2019 FINAL  Final  MRSA PCR Screening     Status: None   Collection Time: 10/29/19  1:44 PM   Specimen: Nasopharyngeal  Result Value Ref Range Status   MRSA by PCR NEGATIVE NEGATIVE Final    Comment:        The GeneXpert MRSA Assay (FDA approved for NASAL specimens only), is one component of a comprehensive MRSA colonization surveillance program. It is not intended to diagnose MRSA infection nor to guide or monitor treatment for MRSA infections. Performed at [facility], [place] [address] [place], [place] [address]      Radiology Studies: No results found.  Scheduled Meds: . aspirin EC  81 mg Oral Daily  . calcium carbonate  400 mg of elemental calcium Oral BID  . Chlorhexidine Gluconate Cloth  6 each Topical Daily  . heparin  5,000 Units Subcutaneous Q8H   Continuous Infusions: . sodium chloride    . sodium chloride       LOS: 8 days    Time spent: 25 mins.    Shawna Clamp, MD [facility]   If 7PM-7AM, please contact night-coverage

## 2019-11-04 NOTE — Progress Notes (Signed)
Physical Therapy Treatment Patient Details Name: [name] MRN: [id number] DOB: [birth year] Today's Date: 11/04/2019    History of Present Illness 49 year old male presents with RUE and RLE weakness and pain. Pt was intoxicated and using drugs night prior to admit and fell and was on floor all night. Pt admitted with ARF and rhabdomyolysis.   Brain MRI-bilateral basal ganglia infarcts and posterior watershed infarcts on right. Pt started HD on 9/8.  PMH: PE, tobacco abuse, crohns disease, polysubstance abuse    PT Comments    Pt making slow progress. Continue to recommend CIR for further therapy.    Follow Up Recommendations  CIR     Equipment Recommendations  Rolling walker with 5" wheels    Recommendations for Other Services       Precautions / Restrictions Precautions Precautions: Fall Precaution Comments: foot drop on RLE    Mobility  Bed Mobility Overal bed mobility: Needs Assistance Bed Mobility: Supine to Sit     Supine to sit: Mod assist;HOB elevated     General bed mobility comments: Assist to move RLE off of bed and elevate trunk into sitting.  Transfers Overall transfer level: Needs assistance Equipment used: Rolling walker (2 wheeled) Transfers: Sit to/from Stand Sit to Stand: Min assist         General transfer comment: Assist to bring hips up and for balance. Cues for hand placement.   Ambulation/Gait Ambulation/Gait assistance: Min assist Gait Distance (Feet): 20 Feet (20' x 1, 12' x 1) Assistive device: Rolling walker (2 wheeled) Gait Pattern/deviations: Step-through pattern;Decreased step length - right;Decreased step length - left;Decreased dorsiflexion - right Gait velocity: decr Gait velocity interpretation: <1.8 ft/sec, indicate of risk for recurrent falls General Gait Details: Assist for balance. Pt declined hallway amb and wanted to stay in the room   Stairs             Wheelchair Mobility    Modified Rankin (Stroke  Patients Only) Modified Rankin (Stroke Patients Only) Pre-Morbid Rankin Score: No symptoms Modified Rankin: Moderately severe disability     Balance Overall balance assessment: Needs assistance Sitting-balance support: No upper extremity supported;Feet supported Sitting balance-Leahy Scale: Good     Standing balance support: No upper extremity supported;During functional activity Standing balance-Leahy Scale: Fair                              Cognition Arousal/Alertness: Awake/alert Behavior During Therapy: Flat affect Overall Cognitive Status: Impaired/Different from baseline Area of Impairment: Safety/judgement;Problem solving                         Safety/Judgement: Decreased awareness of safety   Problem Solving: Slow processing;Requires verbal cues        Exercises      General Comments        Pertinent Vitals/Pain Pain Assessment: Faces Faces Pain Scale: Hurts even more Pain Location: right arm and leg Pain Descriptors / Indicators: Grimacing;Sore Pain Intervention(s): Limited activity within patient's tolerance;Monitored during session;Repositioned    Home Living                      Prior Function            PT Goals (current goals can now be found in the care plan section) Progress towards PT goals: Progressing toward goals    Frequency    Min 4X/week  PT Plan Current plan remains appropriate    Co-evaluation              AM-PAC PT "6 Clicks" Mobility   Outcome Measure  Help needed turning from your back to your side while in a flat bed without using bedrails?: A Little Help needed moving from lying on your back to sitting on the side of a flat bed without using bedrails?: A Lot Help needed moving to and from a bed to a chair (including a wheelchair)?: A Little Help needed standing up from a chair using your arms (e.g., wheelchair or bedside chair)?: A Little Help needed to walk in hospital  room?: A Little Help needed climbing 3-5 steps with a railing? : A Lot 6 Click Score: 16    End of Session Equipment Utilized During Treatment: Gait belt Activity Tolerance: Patient tolerated treatment well Patient left: in chair;with call bell/phone within reach Nurse Communication: Mobility status PT Visit Diagnosis: Difficulty in walking, not elsewhere classified (R26.2);Muscle weakness (generalized) (M62.81)     Time: 6415-8309 PT Time Calculation (min) (ACUTE ONLY): 15 min  Charges:  $Gait Training: 8-22 mins                     [facility] Pager [phone] Office [place] 11/04/2019, 12:56 PM

## 2019-11-04 NOTE — Progress Notes (Signed)
Occupational Therapy Treatment Patient Details Name: [name] MRN: [id number] DOB: February 20, 1971 Today's Date: 11/04/2019    History of present illness 49 year old male presents with RUE and RLE weakness and pain. Pt was intoxicated and using drugs night prior to admit and fell and was on floor all night. Pt admitted with ARF and rhabdomyolysis.   Brain MRI-bilateral basal ganglia infarcts and posterior watershed infarcts on right. Pt started HD on 9/8.  PMH: PE, tobacco abuse, crohns disease, polysubstance abuse   OT comments  Pt received semi-reclined in bed, nurse present, pt agreeable to OT tx. Since last treatment, pt has progressed gradually, continuing to work towards participating in functional self-care goals and functional mobility. Pt continues to exhibit pain, edema, balance deficits, low activity tolerance, decreased ROM/strength, and cognitive deficits. Today, pt required min assist for bed mobility, min guard for functional mobility/transfers, min guard for grooming at sink, and min guard for toileting tasks. Educated pt on RW management, safe transferring methods, and activity pacing. Pt would benefit from continued skilled acute OT services to maximize independence with ADLs/IADLs.    Follow Up Recommendations  Home health OT;Supervision/Assistance - 24 hour    Equipment Recommendations  Tub/shower seat    Recommendations for Other Services  (None)    Precautions / Restrictions Precautions Precautions: Fall Precaution Comments: foot drop on RLE Restrictions Weight Bearing Restrictions: No       Mobility Bed Mobility Overal bed mobility: Needs Assistance Bed Mobility: Supine to Sit     Supine to sit: Min assist;HOB elevated Sit to supine: Min assist;HOB elevated   General bed mobility comments: Assist to move RLE off of bed and elevate trunk into sitting.  Transfers Overall transfer level: Needs assistance Equipment used: Rolling walker (2  wheeled) Transfers: Sit to/from Stand Sit to Stand: Min guard         General transfer comment: Assist to bring hips up and for balance. Cues for hand placement.     Balance Overall balance assessment: Needs assistance Sitting-balance support: No upper extremity supported;Feet supported Sitting balance-Leahy Scale: Good     Standing balance support: Bilateral upper extremity supported;During functional activity Standing balance-Leahy Scale: Fair Standing balance comment: stood at sink with min guard/supervision for grooming tasks        ADL either performed or assessed with clinical judgement   ADL Overall ADL's : Needs assistance/impaired     Grooming: Min guard;Standing;Cueing for safety Grooming Details (indicate cue type and reason): washed hands and brushed teeth standing at sink with RW       Toilet Transfer: Min guard;Supervision/safety;Regular Toilet;Grab bars;RW Armed forces technical officer Details (indicate cue type and reason): cues for standing completely in front of toilet before sitting         Functional mobility during ADLs: Min guard;Cueing for safety;Cueing for sequencing;Rolling walker General ADL Comments: cues for RW management; assist with managing RUE and RLE in bed     Cognition Arousal/Alertness: Awake/alert Behavior During Therapy: Flat affect Overall Cognitive Status: Impaired/Different from baseline Area of Impairment: Safety/judgement;Problem solving        Safety/Judgement: Decreased awareness of safety   Problem Solving: Slow processing;Requires verbal cues General Comments: slow proccessing, decreased initiation with mobility              General Comments swelling noted to RUE and RLE- elevated on pillows    Pertinent Vitals/ Pain       Pain Assessment: 0-10 Pain Score: 7  Pain Location: right arm and leg Pain Descriptors /  Indicators: Grimacing;Sore Pain Intervention(s): Limited activity within patient's tolerance;Monitored during  session;Repositioned;Premedicated before session         Frequency  Min 2X/week        Progress Toward Goals  OT Goals(current goals can now be found in the care plan section)  Progress towards OT goals: Progressing toward goals  Acute Rehab OT Goals Patient Stated Goal: I want to use my R leg better and get back home. OT Goal Formulation: With patient Time For Goal Achievement: 11/16/19 Potential to Achieve Goals: Good  Plan Discharge plan remains appropriate       AM-PAC OT "6 Clicks" Daily Activity     Outcome Measure   Help from another person eating meals?: None Help from another person taking care of personal grooming?: None Help from another person toileting, which includes using toliet, bedpan, or urinal?: A Little Help from another person bathing (including washing, rinsing, drying)?: A Lot Help from another person to put on and taking off regular upper body clothing?: A Little Help from another person to put on and taking off regular lower body clothing?: A Lot 6 Click Score: 18    End of Session Equipment Utilized During Treatment: Gait belt;Rolling walker  OT Visit Diagnosis: Unsteadiness on feet (R26.81);Muscle weakness (generalized) (M62.81);Pain;Other (comment) Pain - Right/Left: Right Pain - part of body: Shoulder;Leg   Activity Tolerance Patient tolerated treatment well;Patient limited by fatigue   Patient Left in bed;with call bell/phone within reach;with bed alarm set   Nurse Communication Mobility status        Time: 3329-5188 OT Time Calculation (min): 19 min  Charges: OT General Charges $OT Visit: 1 Visit OT Treatments $Self Care/Home Management : 8-22 mins  Michel Bickers, OTR/L [facility] [phone]   Francesca Jewett 11/04/2019, 5:48 PM

## 2019-11-05 LAB — CBC WITH DIFFERENTIAL/PLATELET
Abs Immature Granulocytes: 4.83 10*3/uL — ABNORMAL HIGH (ref 0.00–0.07)
Basophils Absolute: 0.1 10*3/uL (ref 0.0–0.1)
Basophils Relative: 0 %
Eosinophils Absolute: 0.4 10*3/uL (ref 0.0–0.5)
Eosinophils Relative: 2 %
HCT: 31.4 % — ABNORMAL LOW (ref 39.0–52.0)
Hemoglobin: 10.2 g/dL — ABNORMAL LOW (ref 13.0–17.0)
Immature Granulocytes: 22 %
Lymphocytes Relative: 10 %
Lymphs Abs: 2.2 10*3/uL (ref 0.7–4.0)
MCH: 29.7 pg (ref 26.0–34.0)
MCHC: 32.5 g/dL (ref 30.0–36.0)
MCV: 91.3 fL (ref 80.0–100.0)
Monocytes Absolute: 1.2 10*3/uL — ABNORMAL HIGH (ref 0.1–1.0)
Monocytes Relative: 5 %
Neutro Abs: 13.1 10*3/uL — ABNORMAL HIGH (ref 1.7–7.7)
Neutrophils Relative %: 61 %
Platelets: 183 10*3/uL (ref 150–400)
RBC: 3.44 MIL/uL — ABNORMAL LOW (ref 4.22–5.81)
RDW: 14.6 % (ref 11.5–15.5)
WBC: 21.7 10*3/uL — ABNORMAL HIGH (ref 4.0–10.5)
nRBC: 0 % (ref 0.0–0.2)

## 2019-11-05 LAB — COMPREHENSIVE METABOLIC PANEL
ALT: 59 U/L — ABNORMAL HIGH (ref 0–44)
AST: 142 U/L — ABNORMAL HIGH (ref 15–41)
Albumin: 2.1 g/dL — ABNORMAL LOW (ref 3.5–5.0)
Alkaline Phosphatase: 42 U/L (ref 38–126)
Anion gap: 15 (ref 5–15)
BUN: 106 mg/dL — ABNORMAL HIGH (ref 6–20)
CO2: 20 mmol/L — ABNORMAL LOW (ref 22–32)
Calcium: 6.7 mg/dL — ABNORMAL LOW (ref 8.9–10.3)
Chloride: 96 mmol/L — ABNORMAL LOW (ref 98–111)
Creatinine, Ser: 11.95 mg/dL — ABNORMAL HIGH (ref 0.61–1.24)
GFR calc Af Amer: 5 mL/min — ABNORMAL LOW (ref >60)
GFR calc non Af Amer: 4 mL/min — ABNORMAL LOW (ref >60)
Glucose, Bld: 112 mg/dL — ABNORMAL HIGH (ref 70–99)
Potassium: 4.6 mmol/L (ref 3.5–5.1)
Sodium: 131 mmol/L — ABNORMAL LOW (ref 135–145)
Total Bilirubin: 1.2 mg/dL (ref 0.3–1.2)
Total Protein: 4.5 g/dL — ABNORMAL LOW (ref 6.5–8.1)

## 2019-11-05 LAB — CK: Total CK: 12196 U/L — ABNORMAL HIGH (ref 49–397)

## 2019-11-05 LAB — MAGNESIUM: Magnesium: 2.3 mg/dL (ref 1.7–2.4)

## 2019-11-05 LAB — PHOSPHORUS: Phosphorus: 8.7 mg/dL — ABNORMAL HIGH (ref 2.5–4.6)

## 2019-11-05 MED ORDER — CALCIUM GLUCONATE-NACL 1-0.675 GM/50ML-% IV SOLN
1.0000 g | Freq: Once | INTRAVENOUS | Status: AC
  Administered 2019-11-05: 1000 mg via INTRAVENOUS
  Filled 2019-11-05: qty 50

## 2019-11-05 MED ORDER — HEPARIN SODIUM (PORCINE) 1000 UNIT/ML IJ SOLN
INTRAMUSCULAR | Status: AC
  Administered 2019-11-05: 1000 [IU]
  Filled 2019-11-05: qty 1

## 2019-11-05 MED ORDER — INFLUENZA VAC SPLIT QUAD 0.5 ML IM SUSY
0.5000 mL | PREFILLED_SYRINGE | Freq: Once | INTRAMUSCULAR | Status: AC
  Administered 2019-11-09: 0.5 mL via INTRAMUSCULAR
  Filled 2019-11-05: qty 0.5

## 2019-11-05 NOTE — Progress Notes (Signed)
PROGRESS NOTE    [name]  TTS:[id number] DOB: September 19, 1970 DOA: 10/27/2019 PCP: Lavone Orn, MD    Brief Narrative:  [name] a 49 y.o.malewith medical history significant forCrohn's disease, PE not currently on anticoagulation, and depression who presents to the ED for evaluation ofright upper and lower extremity weakness and pain. Patient stated he was out drinking last night (10/26/2019) when he got intoxicated on alcohol, Molly, cocaine. He blacked out and did not recall the events occurring later that night. He apparently fell onto a brick floor kitchen at his friend's residence landing on the right side of his body and the right side of his face. He woke up, unable to move his right arm or leg and had significant pain throughout his right upper and lower extremities. He was brought to the ED by hissignificant other. ED Course: Initial vitals showed BP 63/40, pulse 115, RR 29,  SPO2 95% on room air. Labs significant for potassium >7.5, BUN 31, creatinine 5.26, bicarb 14, ALT 1851, anion gap 24, WBC 43.5, hemoglobin 19.3, hematocrit 56.4, platelets 276,000, serum ethanol elevated at 24, lactic acid 6.8, CK >50,000, UDS is positive for cocaine. CT head/maxillofacial/cervical spine without contrast were negative for acute intracranial abnormality. Portable chest x-ray unremarkable Right ankle and hip x-ray is negative for acute fracture or dislocation. MRI brain shows punctate acute infarcts in the right septal lobe and right cerebellum. CT abdomen/pelvis unremarkable.  Nephrology, Neurology, orthopedic surgery were consulted.  Patient admitted for further management.  Patient underwent placement of right IJ tunnel catheter.  Patient underwent hemodialysis 10/31/19.  He reports feeling slightly better, labs are improving.  He is getting replacement for electrolyte abnormalities. He has had HD x 4 , Will continued on MWF schedule. Neurology recommended aggressive treatment for  resistant hypocalcemia,  stroke work-up is essentially completed.  Once acute kidney injury and acute liver injury resolves,  Patient needs to be started on statins.  He has had a stroke likely from hypoperfusion event versus vasospasm from cocaine use. EMG nerve conduction studies in 4 to 6 weeks as outpatient.  Ortho consulted stated no concern for compartment syndrome.  Assessment & Plan:   Principal Problem:   Acute renal failure due to rhabdomyolysis [facility]) Active Problems:   Hyperkalemia   Transaminitis   High anion gap metabolic acidosis   Alcohol use   Substance use disorder   Acute cerebral infarction (HCC)   Hypotension   Crohn's disease (HCC)   Lactic acidosis   Rhabdomyolysis with acute renal failure /High anion gap metabolic acidosis/ Hypotensive on arrival. CK >50,000--> 22,988-->50,00 >> 23000 >>. 09233 ->22220> 13538 >> 12196 Continue to trend CK. Cr worsened 8.42-->13.86- > 11.69 >> 13.86 >>>11.81 > 9.90 > 11.95 Nephrology consulted, started on bicarb drip, due to minimal UO,   He underwent Hemodialysis X 4 , will be continued on MWF schedule. Hypotension has resolved, metabolic acidosis has improved, CK is trending down. Daily CMP  Hyperkalemia Resolved.  Hypocalcemia.  Repeat 1 dose of calcium gluconate 2 g Calcium 6.5 today. Daily CMP  Transaminitis: AST 4337, ALT 1851 on admission, trending down AST 1292 , ALT 1547  >> AST 584, ALT634 Multifactorial from rhabdomyolysis, hypoperfusion, alcohol use Normal acetaminophen level Daily CMP  Leukocytosis >> Afebrile, no signs of infection. Likely reactive from rhabdomyolysis in addition to hemoconcentration. Recheck CBC.  Right upper and lower extremity weakness /edema /pain/acute infarction of the right occipital lobe and right cerebellum/watershed stroke/globi pallidi MRI brain shows punctate acute infarcts  in the right septal lobe and right cerebellum. Neurology consulted, right-sided weakness  likely peripheral in nature (nerve palsy), may need outpatient EMG if continues to be weak MRI cervical/lumbar spine fairly unremarkable, normal MRA of the head and neck Echo : LVEF 55% Orthopedics consulted for possible developing compartment syndrome, likely acute neurologic palsy, recommend outpatient EMG if no improvement. No concerns for compartment syndrome. PT/OT Right am swollen,  Right arm venous duplex ruled out DVT.  Crohn's disease Follows with [facility], Dr. Havery Moros.  Has been on Remicade monotherapy.  Currently stable.  Hold Remicade.  Substance/alcohol abuse. Denies any prior history of withdrawal. UDS positive for cocaine, reports using Molly Continue CIWA, no standing Ativan at this time given multiple medical complications as above    DVT prophylaxis:  Heparin Code Status: Full Family Communication: Family was at bed side. Disposition Plan: . Dispo: The patient is from: Home  Anticipated d/c is to:  CIR   Anticipated d/c date is: 1-2 days  Patient currently is not medically stable to d/c.    Consultants:   Nephrology, Neurology, Orthopaedics.  Procedures:  Hemodialysis. Antimicrobials:  Anti-infectives (From admission, onward)   Start     Dose/Rate Route Frequency Ordered Stop   10/30/19 1737  ceFAZolin (ANCEF) 2-4 GM/100ML-% IVPB       Note to Pharmacy: Desiree Hane   : cabinet override      10/30/19 1737 10/30/19 1740      Subjective: Patient was seen and examined at bedside.  Overnight events noted.  He reports right arm is more swollen.  Nurse report he is not keeping his arm elevated, denies any other concerns.  He is going to have hemodialysis today.  Objective: Vitals:   11/05/19 1452 11/05/19 1457 11/05/19 1500 11/05/19 1530  BP: 126/65 130/65 123/62 116/60  Pulse: 96 97 98 98  Resp: 18 15    Temp: 97.9 F (36.6 C)     TempSrc: Oral     SpO2: 96%     Weight: 107.1 kg     Height:         Intake/Output Summary (Last 24 hours) at 11/05/2019 1557 Last data filed at 11/05/2019 1543 Gross per 24 hour  Intake 959.74 ml  Output --  Net 959.74 ml   Filed Weights   11/04/19 0500 11/05/19 0414 11/05/19 1452  Weight: 105.1 kg 108.2 kg 107.1 kg    Examination:  General exam: Appears calm and comfortable, bruise noted on right cheek. Respiratory system: Clear to auscultation. Respiratory effort normal. Cardiovascular system: S1 & S2 heard, RRR. No JVD, murmurs, rubs, gallops or clicks. No pedal edema. Gastrointestinal system: Abdomen is nondistended, soft and nontender. No organomegaly or masses felt.  Normal bowel sounds heard. Central nervous system: Alert and oriented. No focal neurological deficits. Extremities: Right arm increased swelling noted, leg swelling improved, Right arm tenderness noted. ROM : improved. Skin: No rashes, lesions or ulcers Psychiatry: Judgement and insight appear normal. Mood & affect appropriate.     Data Reviewed: I have personally reviewed following labs and imaging studies  CBC: Recent Labs  Lab 11/01/19 0747 11/02/19 0400 11/03/19 0434 11/04/19 0504 11/05/19 0310  WBC 15.9* 24.0* 22.9* 21.1* 21.7*  NEUTROABS 12.3* 17.2* 13.3* 11.8* 13.1*  HGB 12.3* 14.3 11.9* 10.7* 10.2*  HCT 36.7* 41.6 35.3* 32.3* 31.4*  MCV 90.0 90.0 90.3 90.7 91.3  PLT 113* 136* 122* 132* 825   Basic Metabolic Panel: Recent Labs  Lab 11/01/19 0747 11/02/19 0400 11/03/19 0434 11/04/19  0504 11/05/19 0310  NA 135 131* 130* 131* 131*  K 3.5 4.2 4.2 3.9 4.6  CL 99 96* 96* 95* 96*  CO2 18* 16* 17* 20* 20*  GLUCOSE 120* 124* 121* 119* 112*  BUN 106* 127* 103* 80* 106*  CREATININE 11.69* 13.86* 11.81* 9.90* 11.95*  CALCIUM 5.4* 5.7* 6.2* 6.5* 6.7*  MG 2.5* 2.5* 2.2 2.2 2.3  PHOS 9.2* 11.0* 9.6* 7.8* 8.7*   GFR: Estimated Creatinine Clearance: 9.4 mL/min (A) (by C-G formula based on SCr of 11.95 mg/dL (H)). Liver Function Tests: Recent Labs  Lab  11/01/19 0747 11/02/19 0400 11/03/19 0434 11/04/19 0504 11/05/19 0310  AST 584* 330* 221* 162* 142*  ALT 634* 287* 137* 85* 59*  ALKPHOS 72 63 54 47 42  BILITOT 0.6 0.9 0.9 0.6 1.2  PROT 5.0* 4.9* 4.6* 4.5* 4.5*  ALBUMIN 2.3* 2.2* 2.1* 2.2* 2.1*   No results for input(s): LIPASE, AMYLASE in the last 168 hours. No results for input(s): AMMONIA in the last 168 hours. Coagulation Profile: No results for input(s): INR, PROTIME in the last 168 hours. Cardiac Enzymes: Recent Labs  Lab 11/01/19 0747 11/02/19 0400 11/03/19 0434 11/04/19 0504 11/05/19 0310  CKTOTAL 23,258* 07,371* 22,220* 13,538* 12,196*   BNP (last 3 results) No results for input(s): PROBNP in the last 8760 hours. HbA1C: No results for input(s): HGBA1C in the last 72 hours. CBG: No results for input(s): GLUCAP in the last 168 hours. Lipid Profile: No results for input(s): CHOL, HDL, LDLCALC, TRIG, CHOLHDL, LDLDIRECT in the last 72 hours. Thyroid Function Tests: No results for input(s): TSH, T4TOTAL, FREET4, T3FREE, THYROIDAB in the last 72 hours. Anemia Panel: No results for input(s): VITAMINB12, FOLATE, FERRITIN, TIBC, IRON, RETICCTPCT in the last 72 hours. Sepsis Labs: No results for input(s): PROCALCITON, LATICACIDVEN in the last 168 hours.  Recent Results (from the past 240 hour(s))  Culture, blood (Routine X 2) w Reflex to ID Panel     Status: None   Collection Time: 10/27/19  8:43 PM   Specimen: BLOOD  Result Value Ref Range Status   Specimen Description   Final    BLOOD LEFT ANTECUBITAL Performed at [facility] [address] [place], [place] [address]    Special Requests   Final    BOTTLES DRAWN AEROBIC AND ANAEROBIC Blood Culture results may not be optimal due to an excessive volume of blood received in culture bottles Performed at [facility] [address], [place], [place] [address]    Culture   Final    NO GROWTH 5 DAYS Performed at [facility], [place] [address], [place], [place] [address]    Report Status 11/02/2019 FINAL  Final  SARS Coronavirus 2 by RT PCR (hospital order, performed in [facility] hospital lab) Nasopharyngeal Nasopharyngeal Swab     Status: None   Collection Time: 10/27/19  8:43 PM   Specimen: Nasopharyngeal Swab  Result Value Ref Range Status   SARS Coronavirus 2 NEGATIVE NEGATIVE Final    Comment: (NOTE) SARS-CoV-2 target nucleic acids are NOT DETECTED.  The SARS-CoV-2 RNA is generally detectable in upper and lower respiratory specimens during the acute phase of infection. The lowest concentration of SARS-CoV-2 viral copies this assay can detect is 250 copies / mL. A negative result does not preclude SARS-CoV-2 infection and should not be used as the sole basis for treatment or other patient management decisions.  A negative result may occur with improper specimen collection / handling,  submission of specimen other than nasopharyngeal swab, presence of viral mutation(s) within the areas targeted by this assay, and inadequate number of viral copies (<250 copies / mL). A negative result must be combined with clinical observations, patient history, and epidemiological information.  Fact Sheet for Patients:   StrictlyIdeas.no  Fact Sheet for Healthcare Providers: BankingDealers.co.za  This test is not yet approved or  cleared by the [place] FDA and has been authorized for detection and/or diagnosis of SARS-CoV-2 by FDA under an Emergency Use Authorization (EUA).  This EUA will remain in effect (meaning this test can be used) for the duration of the COVID-19 declaration under Section 564(b)(1) of the Act, 21 U.S.C. section 360bbb-3(b)(1), unless the authorization is terminated or revoked sooner.  Performed at [facility], [place] [address], [place], [place] [address]   Culture, blood (Routine X 2) w Reflex to ID Panel      Status: None   Collection Time: 10/27/19  9:12 PM   Specimen: BLOOD  Result Value Ref Range Status   Specimen Description   Final    BLOOD RIGHT ANTECUBITAL Performed at [facility] [address] [place], [place] [address]    Special Requests   Final    BOTTLES DRAWN AEROBIC AND ANAEROBIC Blood Culture adequate volume Performed at [facility] [address], [place], [place] [address]    Culture   Final    NO GROWTH 5 DAYS Performed at [facility], [place] [address], [place], [place] [address]    Report Status 11/02/2019 FINAL  Final  MRSA PCR Screening     Status: None   Collection Time: 10/29/19  1:44 PM   Specimen: Nasopharyngeal  Result Value Ref Range Status   MRSA by PCR NEGATIVE NEGATIVE Final    Comment:        The GeneXpert MRSA Assay (FDA approved for NASAL specimens only), is one component of a comprehensive MRSA colonization surveillance program. It is not intended to diagnose MRSA infection nor to guide or monitor treatment for MRSA infections. Performed at [facility], [place] [address], [place],  [address]      Radiology Studies: No results found.  Scheduled Meds:  aspirin EC  81 mg Oral Daily   calcium carbonate  400 mg of elemental calcium Oral BID   Chlorhexidine Gluconate Cloth  6 each Topical Daily   heparin  5,000 Units Subcutaneous Q8H   [START ON 11/07/2019] influenza vac split quadrivalent PF  0.5 mL Intramuscular Once   Continuous Infusions:  sodium chloride     sodium chloride       LOS: 9 days    Time spent: 25 mins.    Shawna Clamp, MD [facility]   If 7PM-7AM, please contact night-coverage

## 2019-11-05 NOTE — Progress Notes (Signed)
[facility] Progress Note  Subjective:   Last HD on 9/11 with 1.5 liters UF.  States he feels ok today.  Is able to tell me that a fall brought him in and admitted to drug use when specifically questioned.   Review of systems:  Denies shortness of breath or chest pain  Denies n/v    Vitals:   11/04/19 2053 11/05/19 0018 11/05/19 0414 11/05/19 0730  BP: 136/64  127/66 (!) 133/58  Pulse: (!) 103 (!) 101 (!) 101 (!) 102  Resp: 18 18 18 18   Temp: 98.6 F (37 C) 99.6 F (37.6 C) 97.8 F (36.6 C) 98.5 F (36.9 C)  TempSrc: Oral Axillary Oral Oral  SpO2: 98% 98% 99%   Weight:   108.2 kg   Height:        Exam:   Gen adult male in bed in NAD Chest clear bilat to bases; unlabored  Heart S1S2 no rub  Abd soft ntnd Ext no edema appreciated  Neuro is alert, Ox 3 provides hx and follows commands Psych no anxiety or agitation  ACCESS: R IJ TDC    Home meds:  - remicade 400 IV every 8 wks  - ciagra 50 prn/ claritin 10 prn  - prn's/ vitamins/ supplements     UDS + cocaine     CT abd 9/04 > Adrenals/Urinary Tract: Both adrenal glands appear normal. The kidneys and collecting system appear normal without evidence of urinary tract calculus or hydronephrosis. Bladder is unremarkable. Otherwise >> IMPRESSION: small portion of transverse colon and fat containing anterior umbilical hernia, however no definite evidence bowel obstruction or strangulation. Moderate amount of colonic stool. Patchy streaky airspace opacity at the right lung base which may be due to atelectasis and/or infectious etiology.     MRI brain 9/04 > IMPRESSION: 1. Symmetric restricted diffusion in the globi pallidi. This is most often seen with an acute toxic or metabolic insult (including carbon monoxide poisoning and drugs of abuse) or hypoxic-ischemic injury. No hemorrhage. 2. Punctate acute infarcts in the R occipital lobe and R cerebellum.    CXR 10/27/19 - IMPRESSION: Low lung volumes with bibasilar  atelectasis. No evidence of acute traumatic injury         Assessment/ Plan: 1. AKI - suspect rhabdomyolysis w/ swollen R leg/ arm, AKI and high K+ in pt who was unconscious on the ground for too long of a time. CPK > 50K.  AKI due to hypotension/ hypovolemia and rhabdomyolysis.  S/p TDC, HD #1 10/31/19.  HD #2 9/10 then HD #3 9/11 for volume and clearance - HD today and will keep MWF schedule this week       2. Rhabdomyolysis: d/t being unconscious/found down.  CK > 50,000.  LFTs coming down   3. Hypocalcemia: aggressive repletion with PO and IV, using 3.5 Ca bath in HD. 4. Hyperkalemia - K+ 7's on admit.  Improved with HD.  Off lokelma 5. Drug abuse  6. H/o Crohn's - hx colostomy 2011, reversal in 2012, on Remicaide    Recent Labs  Lab 11/04/19 0504 11/05/19 0310  K 3.9 4.6  BUN 80* 106*  CREATININE 9.90* 11.95*  CALCIUM 6.5* 6.7*  PHOS 7.8* 8.7*  HGB 10.7* 10.2*   Inpatient medications:  aspirin EC  81 mg Oral Daily   calcium carbonate  400 mg of elemental calcium Oral BID   Chlorhexidine Gluconate Cloth  6 each Topical Daily   heparin  5,000 Units Subcutaneous Q8H  sodium chloride     sodium chloride     sodium chloride, sodium chloride, alteplase, heparin, HYDROmorphone (DILAUDID) injection, lidocaine (PF), lidocaine-prilocaine, ondansetron **OR** ondansetron (ZOFRAN) IV, pentafluoroprop-tetrafluoroeth, senna-docusate    Claudia Desanctis, MD 11/05/2019  11:20 AM

## 2019-11-05 NOTE — Progress Notes (Signed)
Inpatient Rehabilitation Admissions Coordinator  I met at bedside for rehab assessment.We discussed goals and expectations of a possible CIR admit. He is not definitive on a possible CIR admit . I will begin insurance authorization, check benefits and further discuss with him.  Danne Baxter, RN, MSN Rehab Admissions Coordinator [phone] 11/05/2019 11:54 AM

## 2019-11-06 LAB — CBC WITH DIFFERENTIAL/PLATELET
Abs Immature Granulocytes: 2.63 10*3/uL — ABNORMAL HIGH (ref 0.00–0.07)
Basophils Absolute: 0.1 10*3/uL (ref 0.0–0.1)
Basophils Relative: 1 %
Eosinophils Absolute: 0.3 10*3/uL (ref 0.0–0.5)
Eosinophils Relative: 2 %
HCT: 30 % — ABNORMAL LOW (ref 39.0–52.0)
Hemoglobin: 9.6 g/dL — ABNORMAL LOW (ref 13.0–17.0)
Immature Granulocytes: 16 %
Lymphocytes Relative: 9 %
Lymphs Abs: 1.6 10*3/uL (ref 0.7–4.0)
MCH: 29.7 pg (ref 26.0–34.0)
MCHC: 32 g/dL (ref 30.0–36.0)
MCV: 92.9 fL (ref 80.0–100.0)
Monocytes Absolute: 0.9 10*3/uL (ref 0.1–1.0)
Monocytes Relative: 6 %
Neutro Abs: 11.2 10*3/uL — ABNORMAL HIGH (ref 1.7–7.7)
Neutrophils Relative %: 66 %
Platelets: 224 10*3/uL (ref 150–400)
RBC: 3.23 MIL/uL — ABNORMAL LOW (ref 4.22–5.81)
RDW: 14.6 % (ref 11.5–15.5)
WBC: 16.7 10*3/uL — ABNORMAL HIGH (ref 4.0–10.5)
nRBC: 0 % (ref 0.0–0.2)

## 2019-11-06 LAB — IRON AND TIBC
Iron: 43 ug/dL — ABNORMAL LOW (ref 45–182)
Saturation Ratios: 17 % — ABNORMAL LOW (ref 17.9–39.5)
TIBC: 246 ug/dL — ABNORMAL LOW (ref 250–450)
UIBC: 203 ug/dL

## 2019-11-06 LAB — COMPREHENSIVE METABOLIC PANEL
ALT: 42 U/L (ref 0–44)
AST: 93 U/L — ABNORMAL HIGH (ref 15–41)
Albumin: 2 g/dL — ABNORMAL LOW (ref 3.5–5.0)
Alkaline Phosphatase: 40 U/L (ref 38–126)
Anion gap: 10 (ref 5–15)
BUN: 63 mg/dL — ABNORMAL HIGH (ref 6–20)
CO2: 25 mmol/L (ref 22–32)
Calcium: 7.3 mg/dL — ABNORMAL LOW (ref 8.9–10.3)
Chloride: 98 mmol/L (ref 98–111)
Creatinine, Ser: 8.73 mg/dL — ABNORMAL HIGH (ref 0.61–1.24)
GFR calc Af Amer: 7 mL/min — ABNORMAL LOW (ref >60)
GFR calc non Af Amer: 6 mL/min — ABNORMAL LOW (ref >60)
Glucose, Bld: 116 mg/dL — ABNORMAL HIGH (ref 70–99)
Potassium: 4.1 mmol/L (ref 3.5–5.1)
Sodium: 133 mmol/L — ABNORMAL LOW (ref 135–145)
Total Bilirubin: 0.5 mg/dL (ref 0.3–1.2)
Total Protein: 4.3 g/dL — ABNORMAL LOW (ref 6.5–8.1)

## 2019-11-06 LAB — MAGNESIUM: Magnesium: 2.1 mg/dL (ref 1.7–2.4)

## 2019-11-06 LAB — CK: Total CK: 6180 U/L — ABNORMAL HIGH (ref 49–397)

## 2019-11-06 LAB — FERRITIN: Ferritin: 499 ng/mL — ABNORMAL HIGH (ref 24–336)

## 2019-11-06 LAB — PHOSPHORUS: Phosphorus: 6.6 mg/dL — ABNORMAL HIGH (ref 2.5–4.6)

## 2019-11-06 MED ORDER — CALCIUM ACETATE (PHOS BINDER) 667 MG PO CAPS
667.0000 mg | ORAL_CAPSULE | Freq: Three times a day (TID) | ORAL | Status: DC
  Administered 2019-11-06 – 2019-11-09 (×8): 667 mg via ORAL
  Filled 2019-11-06 (×8): qty 1

## 2019-11-06 MED ORDER — SODIUM CHLORIDE 0.9 % IV SOLN
125.0000 mg | INTRAVENOUS | Status: DC
  Administered 2019-11-07 – 2019-11-09 (×2): 125 mg via INTRAVENOUS
  Filled 2019-11-06 (×3): qty 10

## 2019-11-06 MED ORDER — CHLORHEXIDINE GLUCONATE CLOTH 2 % EX PADS
6.0000 | MEDICATED_PAD | Freq: Every day | CUTANEOUS | Status: DC
  Administered 2019-11-07: 6 via TOPICAL

## 2019-11-06 NOTE — Progress Notes (Signed)
Physical Therapy Treatment Patient Details Name: [name] MRN: [id number] DOB: 03-21-1970 Today's Date: 11/06/2019    History of Present Illness 49 year old male presents with RUE and RLE weakness and pain. Pt was intoxicated and using drugs night prior to admit and fell and was on floor all night. Pt admitted with ARF and rhabdomyolysis.   Brain MRI-bilateral basal ganglia infarcts and posterior watershed infarcts on right. Pt started HD on 9/8.  PMH: PE, tobacco abuse, crohns disease, polysubstance abuse    PT Comments    Pt asleep on entry but easily roused. Agreeable to therapy with return to bed to continue nap. Pt continues to be limited in safe mobility by R sided weakness, and decreased attention to R LE placement, which contributes to decreased balance. Pt requires min A for bed mobility, min guard for transfer and min A for steadying with 50 feet ambulation using RW. D/c plans remain appropriate at this time. PT will continue to follow acutely.   Follow Up Recommendations  CIR     Equipment Recommendations  Rolling walker with 5" wheels       Precautions / Restrictions Precautions Precautions: Fall Precaution Comments: foot drop on RLE Restrictions Weight Bearing Restrictions: No    Mobility  Bed Mobility Overal bed mobility: Needs Assistance Bed Mobility: Supine to Sit     Supine to sit: HOB elevated;Min assist Sit to supine: Min guard;Min assist   General bed mobility comments: Assist to move RLE off of bed and elevate trunk into sitting., taught to hook L LE under R LE to assist with return to bed, pt able to complete with min guard however has increased hip pain, requires minA for squaring hips in bed  Transfers Overall transfer level: Needs assistance Equipment used: Rolling walker (2 wheeled) Transfers: Sit to/from Stand Sit to Stand: Min guard         General transfer comment: min guard for safety, cues for hand placement    Ambulation/Gait Ambulation/Gait assistance: Min assist Gait Distance (Feet): 50 Feet Assistive device: Rolling walker (2 wheeled) Gait Pattern/deviations: Step-through pattern;Decreased step length - right;Decreased step length - left;Decreased dorsiflexion - right Gait velocity: decr   General Gait Details: min A for balance, increased cuing for awareness of positioning of R LE, as he rolled RW over his foot, requires cuing for slowing down and taking smaller steps as he fatigues not speeding up and taking larger unsteady steps      Modified Rankin (Stroke Patients Only) Modified Rankin (Stroke Patients Only) Pre-Morbid Rankin Score: No symptoms Modified Rankin: Moderately severe disability     Balance Overall balance assessment: Needs assistance Sitting-balance support: No upper extremity supported;Feet supported Sitting balance-Leahy Scale: Good     Standing balance support: No upper extremity supported;During functional activity Standing balance-Leahy Scale: Fair                              Cognition Arousal/Alertness: Awake/alert Behavior During Therapy: Flat affect Overall Cognitive Status: Impaired/Different from baseline Area of Impairment: Safety/judgement;Problem solving                         Safety/Judgement: Decreased awareness of safety   Problem Solving: Slow processing;Requires verbal cues General Comments: decreased R side awareness          General Comments General comments (skin integrity, edema, etc.): VSS on RA      Pertinent Vitals/Pain Pain Assessment:  Faces Faces Pain Scale: Hurts even more Pain Location: right arm and leg Pain Descriptors / Indicators: Grimacing;Sore Pain Intervention(s): Limited activity within patient's tolerance;Monitored during session;Repositioned           PT Goals (current goals can now be found in the care plan section) Acute Rehab PT Goals Patient Stated Goal: have better use of  RLE  PT Goal Formulation: With patient Time For Goal Achievement: 11/11/19 Potential to Achieve Goals: Good Progress towards PT goals: Progressing toward goals    Frequency    Min 4X/week      PT Plan Current plan remains appropriate       AM-PAC PT "6 Clicks" Mobility   Outcome Measure  Help needed turning from your back to your side while in a flat bed without using bedrails?: A Little Help needed moving from lying on your back to sitting on the side of a flat bed without using bedrails?: A Lot Help needed moving to and from a bed to a chair (including a wheelchair)?: A Little Help needed standing up from a chair using your arms (e.g., wheelchair or bedside chair)?: A Little Help needed to walk in hospital room?: A Little Help needed climbing 3-5 steps with a railing? : A Lot 6 Click Score: 16    End of Session Equipment Utilized During Treatment: Gait belt Activity Tolerance: Patient tolerated treatment well Patient left: in chair;with call bell/phone within reach Nurse Communication: Mobility status PT Visit Diagnosis: Difficulty in walking, not elsewhere classified (R26.2);Muscle weakness (generalized) (M62.81)     Time: 6644-0347 PT Time Calculation (min) (ACUTE ONLY): 22 min  Charges:  $Gait Training: 8-22 mins                     Elga Santy B. Migdalia Dk PT, DPT Acute Rehabilitation Services Pager [phone] Office [phone]    [facility] 11/06/2019, 2:00 PM

## 2019-11-06 NOTE — Progress Notes (Signed)
[facility] Progress Note  Subjective:   Last HD on 9/13 with 1.5 liters UF.  Not making much urine per pt.  Had 25 mL and one unmeasured void over 9/13.  Called his wife today to update her.  Review of systems:    Denies shortness of breath or chest pain  Denies n/v    Vitals:   11/05/19 2318 11/06/19 0422 11/06/19 0843 11/06/19 1107  BP: (!) 109/58 129/63 117/61 111/64  Pulse: (!) 102 (!) 105  (!) 105  Resp: 18 18 16 16   Temp: 98.3 F (36.8 C) 98.2 F (36.8 C) 97.9 F (36.6 C) 98.6 F (37 C)  TempSrc: Oral Oral Oral Oral  SpO2: 96% 97% 97% 99%  Weight:  98.6 kg    Height:        Exam:   Gen adult male in bed in NAD   Chest clear bilat to bases; unlabored  Heart S1S2 no rub  Abd soft ntnd Ext LLE and LUE 1+ edema; no RLE edema appreciated  Neuro is alert, Ox 3 provides hx and follows commands Psych no anxiety or agitation  ACCESS: R IJ TDC    Home meds:  - remicade 400 IV every 8 wks  - ciagra 50 prn/ claritin 10 prn  - prn's/ vitamins/ supplements     UDS + cocaine     CT abd 9/04 > Adrenals/Urinary Tract: Both adrenal glands appear normal. The kidneys and collecting system appear normal without evidence of urinary tract calculus or hydronephrosis. Bladder is unremarkable. Otherwise >> IMPRESSION: small portion of transverse colon and fat containing anterior umbilical hernia, however no definite evidence bowel obstruction or strangulation. Moderate amount of colonic stool. Patchy streaky airspace opacity at the right lung base which may be due to atelectasis and/or infectious etiology.     MRI brain 9/04 > IMPRESSION: 1. Symmetric restricted diffusion in the globi pallidi. This is most often seen with an acute toxic or metabolic insult (including carbon monoxide poisoning and drugs of abuse) or hypoxic-ischemic injury. No hemorrhage. 2. Punctate acute infarcts in the R occipital lobe and R cerebellum.    CXR 10/27/19 - IMPRESSION: Low lung volumes with  bibasilar atelectasis. No evidence of acute traumatic injury         Assessment/ Plan: 1. AKI - suspect rhabdomyolysis w/ swollen R leg/ arm, AKI and high K+ in pt who was unconscious on the ground for too long of a time. CPK > 50K.  AKI due to hypotension/ hypovolemia and rhabdomyolysis.  S/p TDC, HD #1 10/31/19.  HD #2 9/10 then HD #3 9/11 for volume and clearance - HD per MWF schedule this week - No signs of recovery yet.   2. Rhabdomyolysis: d/t being unconscious/found down.  CK > 50,000.  LFTs coming down   3. Hypocalcemia: aggressive repletion with PO and IV, using 3.5 Ca bath in HD. 4. Hyperkalemia - K+ 7's on admit.  Improved with HD.  Off lokelma 5. Drug abuse  6. H/o Crohn's - hx colostomy 2011, reversal in 2012, on Remicaide 7. Hyperphosphatemia - improving with HD.  Add calcium acetate with meals.  Check intact PTH 8. Anemia normocytic - some contribution from renal failure.  Check iron panel     Recent Labs  Lab 11/05/19 0310 11/06/19 0532  K 4.6 4.1  BUN 106* 63*  CREATININE 11.95* 8.73*  CALCIUM 6.7* 7.3*  PHOS 8.7* 6.6*  HGB 10.2* 9.6*   Inpatient medications: . aspirin EC  81  mg Oral Daily  . calcium carbonate  400 mg of elemental calcium Oral BID  . Chlorhexidine Gluconate Cloth  6 each Topical Daily  . heparin  5,000 Units Subcutaneous Q8H  . [START ON 11/07/2019] influenza vac split quadrivalent PF  0.5 mL Intramuscular Once   . sodium chloride    . sodium chloride     sodium chloride, sodium chloride, alteplase, heparin, HYDROmorphone (DILAUDID) injection, lidocaine (PF), lidocaine-prilocaine, ondansetron **OR** ondansetron (ZOFRAN) IV, pentafluoroprop-tetrafluoroeth, senna-docusate    Claudia Desanctis, MD 11/06/2019  11:44 AM

## 2019-11-06 NOTE — Progress Notes (Signed)
Pt's spouse at bedside and able to receive plan of care update from Attending Dwyane Dee, MD and CIR Julious Payer, Pamala Hurry).   Spouse had to leave but requesting Nephrology to call her and give her an update today. Her # is [phone]

## 2019-11-06 NOTE — Progress Notes (Signed)
Inpatient Rehabilitation Admissions Coordinator  I met at bedside with patient and his wife. We discussed goals and expectations of a possible Cir admit pending insurance and bed availability. They are in agreement to admit and prefer Cir rather than SNF. We also discussed other inpt rehab venues if bed availability is an issue.   Danne Baxter, RN, MSN Rehab Admissions Coordinator [phone] 11/06/2019 10:48 AM

## 2019-11-06 NOTE — Progress Notes (Signed)
PROGRESS NOTE    [name]  IHK:[id number] DOB: February 28, 1970 DOA: 10/27/2019 PCP: Lavone Orn, MD    Brief Narrative:  [name] a 49 y.o.malewith medical history significant forCrohn's disease, PE not currently on anticoagulation, and depression who presents to the ED for evaluation ofright upper and lower extremity weakness and pain. Patient stated he was out drinking last night (10/26/2019) when he got intoxicated on alcohol, Molly, cocaine. He blacked out and did not recall the events occurring later that night. He apparently fell onto a brick floor kitchen at his friend's residence landing on the right side of his body and the right side of his face. He woke up, unable to move his right arm or leg and had significant pain throughout his right upper and lower extremities. He was brought to the ED by hissignificant other. ED Course: Initial vitals showed BP 63/40, pulse 115, RR 29,  SPO2 95% on room air. Labs significant for potassium >7.5, BUN 31, creatinine 5.26, bicarb 14, ALT 1851, anion gap 24, WBC 43.5, hemoglobin 19.3, hematocrit 56.4, platelets 276,000, serum ethanol elevated at 24, lactic acid 6.8, CK >50,000, UDS is positive for cocaine. CT head/maxillofacial/cervical spine without contrast were negative for acute intracranial abnormality. Portable chest x-ray unremarkable Right ankle and hip x-ray is negative for acute fracture or dislocation. MRI brain shows punctate acute infarcts in the right septal lobe and right cerebellum. CT abdomen/pelvis unremarkable.  Nephrology, Neurology, orthopedic surgery were consulted.  Patient admitted for further management.  Patient underwent placement of right IJ tunnel catheter.  Patient initiated hemodialysis on 10/31/19.He reports feeling slightly better, labs are improving.  He has had HD x 4 , Will be continued on MWF schedule. No signs of recovery noted. Neurology recommended aggressive treatment for resistant hypocalcemia,   stroke work-up is essentially completed.  Once acute kidney injury and acute liver injury resolves,  Patient needs to be started on statins.  He has had a stroke likely from hypoperfusion event versus vasospasm from cocaine use. EMG nerve conduction studies in 4 to 6 weeks as outpatient.  Ortho consulted stated no concern for compartment syndrome.  Assessment & Plan:   Principal Problem:   Acute renal failure due to rhabdomyolysis [facility]) Active Problems:   Hyperkalemia   Transaminitis   High anion gap metabolic acidosis   Alcohol use   Substance use disorder   Acute cerebral infarction (HCC)   Hypotension   Crohn's disease (HCC)   Lactic acidosis   Rhabdomyolysis with acute renal failure /High anion gap metabolic acidosis/ Hypotensive on arrival. CK >50,000--> 22,988-->50,00 >> 23000 >>. 75643 ->22220> 13538 >> 12196 >. 6180 Continue to trend CK. Cr worsened 8.42-->13.86- > 11.69 >> 13.86 >>>11.81 > 9.90 > 11.95>>8.73 Nephrology consulted, started on bicarb drip, due to minimal UO,   He underwent Hemodialysis X 4 , will be continued on MWF schedule.No signs of recovery yet. Hypotension has resolved, metabolic acidosis has improved, CK is trending down. Daily CMP  Hyperkalemia Resolved.  Hypocalcemia.  Repeat 1 dose of calcium gluconate 2 g Calcium 6.5 today. Daily CMP  Transaminitis: AST 4337, ALT 1851 on admission, trending down AST 1292 , ALT 1547  >> AST 584, ALT634 Multifactorial from rhabdomyolysis, hypoperfusion, alcohol use Normal acetaminophen level Daily CMP  Leukocytosis >> improving Afebrile, no signs of infection. Likely reactive from rhabdomyolysis in addition to hemoconcentration. Recheck CBC.  Right upper and lower extremity weakness /edema /pain/acute infarction of the right occipital lobe and right cerebellum/watershed stroke/globi pallidi MRI  brain shows punctate acute infarcts in the right septal lobe and right cerebellum. Neurology consulted,  right-sided weakness likely peripheral in nature (nerve palsy), may need outpatient EMG if continues to be weak MRI cervical/lumbar spine fairly unremarkable, normal MRA of the head and neck Echo : LVEF 55% Orthopedics consulted for possible developing compartment syndrome, likely acute neurologic palsy, recommend outpatient EMG if no improvement. No concerns for compartment syndrome. PT/OT Right am swollen,  Right arm venous duplex ruled out DVT.  Crohn's disease Follows with [facility], Dr. Havery Moros.  Has been on Remicade monotherapy.  Currently stable.  Hold Remicade.  Substance/alcohol abuse. Denies any prior history of withdrawal. UDS positive for cocaine, reports using Molly Continue CIWA, no standing Ativan at this time given multiple medical complications as above    DVT prophylaxis:  Heparin. Code Status: Full Family Communication: Family was at bed side. Disposition Plan: . Dispo: The patient is from: Home  Anticipated d/c is to:  CIR   Anticipated d/c date is: 1-2 days  Patient currently is not medically stable to d/c.    Consultants:   Nephrology, Neurology, Orthopaedics.  Procedures:  Hemodialysis. Antimicrobials:  Anti-infectives (From admission, onward)   Start     Dose/Rate Route Frequency Ordered Stop   10/30/19 1737  ceFAZolin (ANCEF) 2-4 GM/100ML-% IVPB       Note to Pharmacy: Desiree Hane   : cabinet override      10/30/19 1737 10/30/19 1740      Subjective: Patient was seen and examined at bedside.  Overnight events noted.   He offers no complaints but wife at bed side has a lot of questions which were answered, still has Right Arm swelling , scrotal swelling.   Objective: Vitals:   11/05/19 2318 11/06/19 0422 11/06/19 0843 11/06/19 1107  BP: (!) 109/58 129/63 117/61 111/64  Pulse: (!) 102 (!) 105  (!) 105  Resp: 18 18 16 16   Temp: 98.3 F (36.8 C) 98.2 F (36.8 C) 97.9 F (36.6 C) 98.6 F (37  C)  TempSrc: Oral Oral Oral Oral  SpO2: 96% 97% 97% 99%  Weight:  98.6 kg    Height:        Intake/Output Summary (Last 24 hours) at 11/06/2019 1629 Last data filed at 11/06/2019 0900 Gross per 24 hour  Intake 722 ml  Output 1500 ml  Net -778 ml   Filed Weights   11/05/19 1452 11/05/19 1836 11/06/19 0422  Weight: 107.1 kg 106.4 kg 98.6 kg    Examination:  General exam: Appears calm and comfortable, bruise noted on right cheek. Respiratory system: Clear to auscultation. Respiratory effort normal. Cardiovascular system: S1 & S2 heard, RRR. No JVD, murmurs, rubs, gallops or clicks. No pedal edema. Gastrointestinal system: Abdomen is nondistended, soft and nontender. No organomegaly or masses felt.  Normal bowel sounds heard. Central nervous system: Alert and oriented. No focal neurological deficits. Urogenital: Scrotal swelling noted. Extremities: Right arm increased swelling noted, leg swelling improved, Right arm tenderness noted. ROM : improved. Skin: No rashes, lesions or ulcers Psychiatry: Judgement and insight appear normal. Mood & affect appropriate.     Data Reviewed: I have personally reviewed following labs and imaging studies  CBC: Recent Labs  Lab 11/02/19 0400 11/03/19 0434 11/04/19 0504 11/05/19 0310 11/06/19 0532  WBC 24.0* 22.9* 21.1* 21.7* 16.7*  NEUTROABS 17.2* 13.3* 11.8* 13.1* 11.2*  HGB 14.3 11.9* 10.7* 10.2* 9.6*  HCT 41.6 35.3* 32.3* 31.4* 30.0*  MCV 90.0 90.3 90.7 91.3 92.9  PLT  136* 122* 132* 183 616   Basic Metabolic Panel: Recent Labs  Lab 11/02/19 0400 11/03/19 0434 11/04/19 0504 11/05/19 0310 11/06/19 0532  NA 131* 130* 131* 131* 133*  K 4.2 4.2 3.9 4.6 4.1  CL 96* 96* 95* 96* 98  CO2 16* 17* 20* 20* 25  GLUCOSE 124* 121* 119* 112* 116*  BUN 127* 103* 80* 106* 63*  CREATININE 13.86* 11.81* 9.90* 11.95* 8.73*  CALCIUM 5.7* 6.2* 6.5* 6.7* 7.3*  MG 2.5* 2.2 2.2 2.3 2.1  PHOS 11.0* 9.6* 7.8* 8.7* 6.6*   GFR: Estimated  Creatinine Clearance: 12.4 mL/min (A) (by C-G formula based on SCr of 8.73 mg/dL (H)). Liver Function Tests: Recent Labs  Lab 11/02/19 0400 11/03/19 0434 11/04/19 0504 11/05/19 0310 11/06/19 0532  AST 330* 221* 162* 142* 93*  ALT 287* 137* 85* 59* 42  ALKPHOS 63 54 47 42 40  BILITOT 0.9 0.9 0.6 1.2 0.5  PROT 4.9* 4.6* 4.5* 4.5* 4.3*  ALBUMIN 2.2* 2.1* 2.2* 2.1* 2.0*   No results for input(s): LIPASE, AMYLASE in the last 168 hours. No results for input(s): AMMONIA in the last 168 hours. Coagulation Profile: No results for input(s): INR, PROTIME in the last 168 hours. Cardiac Enzymes: Recent Labs  Lab 11/02/19 0400 11/03/19 0434 11/04/19 0504 11/05/19 0310 11/06/19 0532  CKTOTAL 07,371* 22,220* 13,538* 12,196* 6,180*   BNP (last 3 results) No results for input(s): PROBNP in the last 8760 hours. HbA1C: No results for input(s): HGBA1C in the last 72 hours. CBG: No results for input(s): GLUCAP in the last 168 hours. Lipid Profile: No results for input(s): CHOL, HDL, LDLCALC, TRIG, CHOLHDL, LDLDIRECT in the last 72 hours. Thyroid Function Tests: No results for input(s): TSH, T4TOTAL, FREET4, T3FREE, THYROIDAB in the last 72 hours. Anemia Panel: Recent Labs    11/06/19 1349  FERRITIN 499*  TIBC 246*  IRON 43*   Sepsis Labs: No results for input(s): PROCALCITON, LATICACIDVEN in the last 168 hours.  Recent Results (from the past 240 hour(s))  Culture, blood (Routine X 2) w Reflex to ID Panel     Status: None   Collection Time: 10/27/19  8:43 PM   Specimen: BLOOD  Result Value Ref Range Status   Specimen Description   Final    BLOOD LEFT ANTECUBITAL Performed at [facility] [address] [place], [place] [address]    Special Requests   Final    BOTTLES DRAWN AEROBIC AND ANAEROBIC Blood Culture results may not be optimal due to an excessive volume of blood received in culture bottles Performed at [facility]  [address] [place], [place] [address]    Culture   Final    NO GROWTH 5 DAYS Performed at [facility], [place] [address], [place], [place] [address]    Report Status 11/02/2019 FINAL  Final  SARS Coronavirus 2 by RT PCR (hospital order, performed in [facility] hospital lab) Nasopharyngeal Nasopharyngeal Swab     Status: None   Collection Time: 10/27/19  8:43 PM   Specimen: Nasopharyngeal Swab  Result Value Ref Range Status   SARS Coronavirus 2 NEGATIVE NEGATIVE Final    Comment: (NOTE) SARS-CoV-2 target nucleic acids are NOT DETECTED.  The SARS-CoV-2 RNA is generally detectable in upper and lower respiratory specimens during the acute phase of infection. The lowest concentration of SARS-CoV-2 viral copies this assay can detect is 250 copies / mL. A negative result does not preclude SARS-CoV-2 infection and should not be  used as the sole basis for treatment or other patient management decisions.  A negative result may occur with improper specimen collection / handling, submission of specimen other than nasopharyngeal swab, presence of viral mutation(s) within the areas targeted by this assay, and inadequate number of viral copies (<250 copies / mL). A negative result must be combined with clinical observations, patient history, and epidemiological information.  Fact Sheet for Patients:   StrictlyIdeas.no  Fact Sheet for Healthcare Providers: BankingDealers.co.za  This test is not yet approved or  cleared by the [place] FDA and has been authorized for detection and/or diagnosis of SARS-CoV-2 by FDA under an Emergency Use Authorization (EUA).  This EUA will remain in effect (meaning this test can be used) for the duration of the COVID-19 declaration under Section 564(b)(1) of the Act, 21 U.S.C. section 360bbb-3(b)(1), unless the authorization is terminated or revoked sooner.  Performed at [facility], [place] [address], [place], [place] [address]   Culture, blood (Routine X 2) w Reflex to ID Panel     Status: None   Collection Time: 10/27/19  9:12 PM   Specimen: BLOOD  Result Value Ref Range Status   Specimen Description   Final    BLOOD RIGHT ANTECUBITAL Performed at [facility] [address] [place], [place] [address]    Special Requests   Final    BOTTLES DRAWN AEROBIC AND ANAEROBIC Blood Culture adequate volume Performed at [facility] [address] [place], [place] [address]    Culture   Final    NO GROWTH 5 DAYS Performed at [facility], [place] [address], [place], [place] [address]    Report Status 11/02/2019 FINAL  Final  MRSA PCR Screening     Status: None   Collection Time: 10/29/19  1:44 PM   Specimen: Nasopharyngeal  Result Value Ref Range Status   MRSA by PCR NEGATIVE NEGATIVE Final    Comment:        The GeneXpert MRSA Assay (FDA approved for NASAL specimens only), is one component of a comprehensive MRSA colonization surveillance program. It is not intended to diagnose MRSA infection nor to guide or monitor treatment for MRSA infections. Performed at [facility], [place] [address], [place],  [address]      Radiology Studies: No results found.  Scheduled Meds: . aspirin EC  81 mg Oral Daily  . calcium acetate  667 mg Oral TID WC  . calcium carbonate  400 mg of elemental calcium Oral BID  . Chlorhexidine Gluconate Cloth  6 each Topical Daily  . [START ON 11/07/2019] Chlorhexidine Gluconate Cloth  6 each Topical Q0600  . heparin  5,000 Units Subcutaneous Q8H  . [START ON 11/07/2019] influenza vac split quadrivalent PF  0.5 mL Intramuscular Once   Continuous Infusions: . sodium chloride    . sodium chloride       LOS: 10 days    Time spent: 25 mins.    Shawna Clamp, MD [facility]   If 7PM-7AM, please contact night-coverage

## 2019-11-06 NOTE — TOC Initial Note (Signed)
Transition of Care [facility]) - Initial/Assessment Note    Patient Details  Name: [name] MRN: [id number] Date of Birth: 08/19/1970  Transition of Care [facility]) CM/SW Contact:    Trula Ore, [place] Phone Number: 11/06/2019, 3:45 PM  Clinical Narrative:                  CSW spoke with patient at bedside. Patient is not sure whether he wants to go to CIR vrs. SNF at discharge. Patient does not have 24/7 supervision at home and wants to discuss with his spouse if he wants to go to CIR vrs. SNF. CSW explained SNF process to patient. Patient wants CSW to follow back up with him tomorrow after discussing discharge plans with his spouse. CSW agreed to follow up with patient tomorrow. Patient has had both of his Covid vaccines. Patient gave CSW permission to discuss his care with his spouse [name].No further questions reported at this time. CSW to continue to follow and assist with discharge planning needs.    Expected Discharge Plan: [place] Barriers to Discharge: Continued Medical Work up   Patient Goals and CMS Choice Patient states their goals for this hospitalization and ongoing recovery are:: to go to CIR or SNF wil discuss with spouse   Choice offered to / list presented to : Patient  Expected Discharge Plan and Services Expected Discharge Plan: [place]   Discharge Planning Services: CM Consult Post Acute Care Choice: Home Health, Durable Medical Equipment Living arrangements for the past 2 months: [place]                 DME Arranged: 3-N-1, Walker rolling DME Agency: [employer] Date DME Agency Contacted: 10/31/19 Time DME Agency Contacted: [phone] Representative spoke with at DME Agency: [place]: PT [place]: [place] Date [place]: 10/31/19 Time HH Agency Contacted: 25 Representative spoke with at [place]: Adela Lank  Prior Living Arrangements/Services Living arrangements  for the past 2 months: [place] with:: Self, Spouse Patient language and need for interpreter reviewed:: Yes Do you feel safe going back to the place where you live?: No   CIR/SNF  Need for Family Participation in Patient Care: Yes (Comment) Care giver support system in place?: Yes (comment)   Criminal Activity/Legal Involvement Pertinent to Current Situation/Hospitalization: No - Comment as needed  Activities of Daily Living Home Assistive Devices/Equipment: None ADL Screening (condition at time of admission) Patient's cognitive ability adequate to safely complete daily activities?: No Is the patient deaf or have difficulty hearing?: No Does the patient have difficulty seeing, even when wearing glasses/contacts?: No Does the patient have difficulty concentrating, remembering, or making decisions?: No Patient able to express need for assistance with ADLs?: Yes Does the patient have difficulty dressing or bathing?: Yes Independently performs ADLs?: No Communication: Independent Dressing (OT): Needs assistance Is this a change from baseline?: Change from baseline, expected to last >3 days Grooming: Needs assistance Is this a change from baseline?: Change from baseline, expected to last >3 days Feeding: Needs assistance Is this a change from baseline?: Change from baseline, expected to last >3 days Bathing: Needs assistance Is this a change from baseline?: Change from baseline, expected to last >3 days Toileting: Needs assistance Is this a change from baseline?: Change from baseline, expected to last >3days In/Out Bed: Needs assistance Is this a change from baseline?: Change from baseline, expected to last >3  days Walks in Home: Independent (prior to [place]) Does the patient have difficulty walking or climbing stairs?: Yes Weakness of Legs: Right Weakness of Arms/Hands: Right  Permission Sought/Granted Permission sought to share information with : Case Manager,  Family Supports, Chartered certified accountant granted to share information with : Yes, Verbal Permission Granted  Share Information with NAME: [name]  Permission granted to share info w AGENCY: [place] granted to share info w Relationship: Spouse  Permission granted to share info w Contact Information: [name] [phone]  Emotional Assessment Appearance:: Appears stated age Attitude/Demeanor/Rapport: Gracious Affect (typically observed): Calm Orientation: : Oriented to Self, Oriented to Place, Oriented to  Time, Oriented to Situation Alcohol / Substance Use: Not Applicable Psych Involvement: No (comment)  Admission diagnosis:  Fall [W19.XXXA] Acute renal failure (ARF) (HCC) [N17.9] AKI (acute kidney injury) ([place]) [N17.9] Acute kidney injury ([place]) [N17.9] Patient Active Problem List   Diagnosis Date Noted  . Acute renal failure due to rhabdomyolysis ([place]) 10/27/2019  . Hyperkalemia 10/27/2019  . Transaminitis 10/27/2019  . High anion gap metabolic acidosis 43/60/6770  . Alcohol use 10/27/2019  . Substance use disorder 10/27/2019  . Acute cerebral infarction ([place]) 10/27/2019  . Hypotension 10/27/2019  . Crohn's disease ([place]) 10/27/2019  . Lactic acidosis 10/27/2019   PCP:  Lavone Orn, MD Pharmacy:   [facility] [address] - [place]. [place] [address] Phone: [phone] Fax: [phone]  [id number] [address] [id number]: [phone]: [phone]    Social Determinants of Health (SDOH) Interventions    Readmission Risk Interventions No flowsheet data found.

## 2019-11-07 LAB — CBC WITH DIFFERENTIAL/PLATELET
Abs Immature Granulocytes: 1.62 10*3/uL — ABNORMAL HIGH (ref 0.00–0.07)
Basophils Absolute: 0.1 10*3/uL (ref 0.0–0.1)
Basophils Relative: 1 %
Eosinophils Absolute: 0.2 10*3/uL (ref 0.0–0.5)
Eosinophils Relative: 1 %
HCT: 29.1 % — ABNORMAL LOW (ref 39.0–52.0)
Hemoglobin: 9.5 g/dL — ABNORMAL LOW (ref 13.0–17.0)
Immature Granulocytes: 10 %
Lymphocytes Relative: 10 %
Lymphs Abs: 1.6 10*3/uL (ref 0.7–4.0)
MCH: 30.5 pg (ref 26.0–34.0)
MCHC: 32.6 g/dL (ref 30.0–36.0)
MCV: 93.6 fL (ref 80.0–100.0)
Monocytes Absolute: 1 10*3/uL (ref 0.1–1.0)
Monocytes Relative: 6 %
Neutro Abs: 12 10*3/uL — ABNORMAL HIGH (ref 1.7–7.7)
Neutrophils Relative %: 72 %
Platelets: 271 10*3/uL (ref 150–400)
RBC: 3.11 MIL/uL — ABNORMAL LOW (ref 4.22–5.81)
RDW: 14.6 % (ref 11.5–15.5)
WBC: 16.5 10*3/uL — ABNORMAL HIGH (ref 4.0–10.5)
nRBC: 0 % (ref 0.0–0.2)

## 2019-11-07 LAB — COMPREHENSIVE METABOLIC PANEL
ALT: 36 U/L (ref 0–44)
AST: 76 U/L — ABNORMAL HIGH (ref 15–41)
Albumin: 2 g/dL — ABNORMAL LOW (ref 3.5–5.0)
Alkaline Phosphatase: 38 U/L (ref 38–126)
Anion gap: 13 (ref 5–15)
BUN: 85 mg/dL — ABNORMAL HIGH (ref 6–20)
CO2: 20 mmol/L — ABNORMAL LOW (ref 22–32)
Calcium: 7.5 mg/dL — ABNORMAL LOW (ref 8.9–10.3)
Chloride: 98 mmol/L (ref 98–111)
Creatinine, Ser: 10.88 mg/dL — ABNORMAL HIGH (ref 0.61–1.24)
GFR calc Af Amer: 6 mL/min — ABNORMAL LOW (ref >60)
GFR calc non Af Amer: 5 mL/min — ABNORMAL LOW (ref >60)
Glucose, Bld: 104 mg/dL — ABNORMAL HIGH (ref 70–99)
Potassium: 4.1 mmol/L (ref 3.5–5.1)
Sodium: 131 mmol/L — ABNORMAL LOW (ref 135–145)
Total Bilirubin: 0.9 mg/dL (ref 0.3–1.2)
Total Protein: 4.5 g/dL — ABNORMAL LOW (ref 6.5–8.1)

## 2019-11-07 LAB — PHOSPHORUS: Phosphorus: 7.8 mg/dL — ABNORMAL HIGH (ref 2.5–4.6)

## 2019-11-07 LAB — MAGNESIUM: Magnesium: 2.1 mg/dL (ref 1.7–2.4)

## 2019-11-07 LAB — CK: Total CK: 4799 U/L — ABNORMAL HIGH (ref 49–397)

## 2019-11-07 NOTE — Progress Notes (Deleted)
Physical Therapy Treatment Patient Details Name: [name] MRN: [id number] DOB: 1970/06/10 Today's Date: 11/07/2019    History of Present Illness 49 year old male presents with RUE and RLE weakness and pain. Pt was intoxicated and using drugs night prior to admit and fell and was on floor all night. Pt admitted with ARF and rhabdomyolysis.   Brain MRI-bilateral basal ganglia infarcts and posterior watershed infarcts on right. Pt started HD on 9/8.  PMH: PE, tobacco abuse, crohns disease, polysubstance abuse    PT Comments    Pt is making good progress toward his goals. Much less painful today improving his ability to participate. Pt continues to be limited by R UE pain and weakness in addition to global generalized weakness. Pt is min guard for bed mobility and transfers requiring increased effort. Pt ambulates today with light min A for steadying. D/c plans remain appropriate at this time. PT will continue to see acutely.   Follow Up Recommendations  CIR     Equipment Recommendations  Rolling walker with 5" wheels    Recommendations for Other Services       Precautions / Restrictions Precautions Precautions: Fall Precaution Comments: foot drop on RLE    Mobility  Bed Mobility Overal bed mobility: Needs Assistance Bed Mobility: Supine to Sit     Supine to sit: HOB elevated;Min guard Sit to supine: Min guard   General bed mobility comments: min guard for sit<>supine increased time, effort and use of bedrail   Transfers Overall transfer level: Needs assistance Equipment used: Rolling walker (2 wheeled) Transfers: Sit to/from Stand Sit to Stand: Min guard         General transfer comment: increased effort to power up and min guard for steadying in standing   Ambulation/Gait Ambulation/Gait assistance: Min assist Gait Distance (Feet): 20 Feet Assistive device: Rolling walker (2 wheeled) Gait Pattern/deviations: Step-through pattern;Decreased step length -  right;Decreased step length - left Gait velocity: decr   General Gait Details: min A for balance, vc for sequencing refuses ambulation in hallway due to fatigye       Modified Rankin (Stroke Patients Only) Modified Rankin (Stroke Patients Only) Pre-Morbid Rankin Score: No symptoms Modified Rankin: Moderately severe disability     Balance Overall balance assessment: Needs assistance Sitting-balance support: No upper extremity supported;Feet supported Sitting balance-Leahy Scale: Good     Standing balance support: No upper extremity supported;During functional activity Standing balance-Leahy Scale: Fair Standing balance comment: stood at sink with min guard/supervision for grooming tasks                            Cognition Arousal/Alertness: Awake/alert Behavior During Therapy: Flat affect Overall Cognitive Status: Impaired/Different from baseline Area of Impairment: Safety/judgement;Problem solving                         Safety/Judgement: Decreased awareness of safety   Problem Solving: Slow processing;Requires verbal cues General Comments: slow processing      Exercises General Exercises - Upper Extremity Shoulder Flexion: AROM;Both;10 reps Elbow Flexion: AROM;Both;10 reps;Seated Elbow Extension: AROM;Both;10 reps Wrist Flexion: AROM;Both;10 reps Wrist Extension: AROM;Both;15 reps Digit Composite Flexion: AROM;Both;10 reps    General Comments General comments (skin integrity, edema, etc.): HR increased to 130s with ambulation      Pertinent Vitals/Pain Pain Assessment: Faces Pain Score: 7  Faces Pain Scale: Hurts a little bit Pain Location: right arm and leg Pain Descriptors / Indicators: Grimacing;Sore  Pain Intervention(s): Limited activity within patient's tolerance;Monitored during session;Repositioned           PT Goals (current goals can now be found in the care plan section) Acute Rehab PT Goals Patient Stated Goal: have  better use of RLE  PT Goal Formulation: With patient Time For Goal Achievement: 11/11/19 Potential to Achieve Goals: Good Progress towards PT goals: Progressing toward goals    Frequency    Min 4X/week      PT Plan Current plan remains appropriate       AM-PAC PT "6 Clicks" Mobility   Outcome Measure  Help needed turning from your back to your side while in a flat bed without using bedrails?: A Little Help needed moving from lying on your back to sitting on the side of a flat bed without using bedrails?: A Lot Help needed moving to and from a bed to a chair (including a wheelchair)?: A Little Help needed standing up from a chair using your arms (e.g., wheelchair or bedside chair)?: A Little Help needed to walk in hospital room?: A Little Help needed climbing 3-5 steps with a railing? : A Lot 6 Click Score: 16    End of Session Equipment Utilized During Treatment: Gait belt Activity Tolerance: Patient tolerated treatment well Patient left: in chair;with call bell/phone within reach Nurse Communication: Mobility status PT Visit Diagnosis: Difficulty in walking, not elsewhere classified (R26.2);Muscle weakness (generalized) (M62.81)     Time: 3568-6168 PT Time Calculation (min) (ACUTE ONLY): 18 min  Charges:  $Gait Training: 8-22 mins                     Ashanna Heinsohn B. Migdalia Dk PT, DPT Acute Rehabilitation Services Pager [phone] Office [phone]    [place] 11/07/2019, 6:00 PM

## 2019-11-07 NOTE — Progress Notes (Signed)
[facility] Progress Note  Subjective:   Last HD on 9/13 with 1.5 liters UF.  Seen and examined on dialysis this morning at 9:15 am.  Blood pressure  107/68 and HR 93  Tolerating goal.  RIJ Tunn catheter.  Feels ok.   Review of systems:    Denies shortness of breath or chest pain  Denies n/v    Vitals:   11/06/19 2054 11/07/19 0020 11/07/19 0655 11/07/19 0825  BP: 138/66 120/65 114/63 114/68  Pulse: 97 99 94 97  Resp: 18 18 18 16   Temp: 98.9 F (37.2 C) 98.4 F (36.9 C) 98.4 F (36.9 C) 97.8 F (36.6 C)  TempSrc: Oral Oral Oral Oral  SpO2: 100% 100% 94% 95%  Weight:   103.5 kg 105.9 kg  Height:        Exam:    Gen adult male in bed in NAD   Chest clear bilat to bases; unlabored  Heart S1S2 no rub  Abd soft ntnd Ext RLE trace to 1+ edema; no LLE edema  Neuro is alert, Ox 3 provides hx and follows commands Psych no anxiety or agitation  ACCESS: R IJ TDC    Home meds:  - remicade 400 IV every 8 wks  - ciagra 50 prn/ claritin 10 prn  - prn's/ vitamins/ supplements     UDS + cocaine     CT abd 9/04 > Adrenals/Urinary Tract: Both adrenal glands appear normal. The kidneys and collecting system appear normal without evidence of urinary tract calculus or hydronephrosis. Bladder is unremarkable. Otherwise >> IMPRESSION: small portion of transverse colon and fat containing anterior umbilical hernia, however no definite evidence bowel obstruction or strangulation. Moderate amount of colonic stool. Patchy streaky airspace opacity at the right lung base which may be due to atelectasis and/or infectious etiology.     MRI brain 9/04 > IMPRESSION: 1. Symmetric restricted diffusion in the globi pallidi. This is most often seen with an acute toxic or metabolic insult (including carbon monoxide poisoning and drugs of abuse) or hypoxic-ischemic injury. No hemorrhage. 2. Punctate acute infarcts in the R occipital lobe and R cerebellum.    CXR 10/27/19 - IMPRESSION: Low lung  volumes with bibasilar atelectasis. No evidence of acute traumatic injury         Assessment/ Plan: 1. AKI - suspect rhabdomyolysis w/ swollen R leg/ arm, AKI and high K+ in pt who was unconscious on the ground for too long of a time. CPK > 50K.  AKI due to hypotension/ hypovolemia and rhabdomyolysis.  S/p TDC, HD #1 10/31/19.  HD #2 9/10 then HD #3 9/11 for volume and clearance - HD per MWF schedule this week - No signs of recovery yet.   2. Rhabdomyolysis: d/t being unconscious/found down.  CK > 50,000.  LFTs coming down   3. Hypocalcemia: aggressive repletion with PO and IV, using 3.5 Ca bath in HD. 4. Hyperkalemia - K+ 7's on admit.  Improved with HD.  Off lokelma 5. Drug abuse  6. H/o Crohn's - hx colostomy 2011, reversal in 2012, on Remicaide 7. Hyperphosphatemia - improving with HD.  Added calcium acetate with meals.  Checked intact PTH 8. Anemia normocytic - some contribution from renal failure. Giving IV iron.     Recent Labs  Lab 11/06/19 0532 11/07/19 0429  K 4.1 4.1  BUN 63* 85*  CREATININE 8.73* 10.88*  CALCIUM 7.3* 7.5*  PHOS 6.6* 7.8*  HGB 9.6* 9.5*   Inpatient medications: . aspirin EC  81 mg Oral Daily  . calcium acetate  667 mg Oral TID WC  . calcium carbonate  400 mg of elemental calcium Oral BID  . Chlorhexidine Gluconate Cloth  6 each Topical Daily  . Chlorhexidine Gluconate Cloth  6 each Topical Q0600  . heparin  5,000 Units Subcutaneous Q8H  . influenza vac split quadrivalent PF  0.5 mL Intramuscular Once   . sodium chloride    . sodium chloride    . ferric gluconate (FERRLECIT/NULECIT) IV     sodium chloride, sodium chloride, alteplase, heparin, HYDROmorphone (DILAUDID) injection, lidocaine (PF), lidocaine-prilocaine, ondansetron **OR** ondansetron (ZOFRAN) IV, pentafluoroprop-tetrafluoroeth, senna-docusate    Claudia Desanctis, MD 11/07/2019  9:20 AM

## 2019-11-07 NOTE — Progress Notes (Signed)
Occupational Therapy Treatment Patient Details Name: [name] MRN: [id number] DOB: 1970-11-24 Today's Date: 11/07/2019    History of present illness 49 year old male presents with RUE and RLE weakness and pain. Pt was intoxicated and using drugs night prior to admit and fell and was on floor all night. Pt admitted with ARF and rhabdomyolysis.   Brain MRI-bilateral basal ganglia infarcts and posterior watershed infarcts on right. Pt started HD on 9/8.  PMH: PE, tobacco abuse, crohns disease, polysubstance abuse   OT comments  Pt progressing toward OT goals, received sitting up in bed with complaints of R hand pain of 7 and observed with +1 pitting edema . Pt agreeable to session to address retrograde massage, positioning and BUE exercises to increased strength and movement of UE to perform ADLs. Pt will benefit from continued acute OT to address established deficits to maximize independence prior to dc. DC and freq remains the same.    Follow Up Recommendations  Home health OT;Supervision/Assistance - 24 hour    Equipment Recommendations  Tub/shower seat    Recommendations for Other Services  (None)    Precautions / Restrictions Precautions Precautions: Fall Precaution Comments: foot drop on RLE       Mobility Bed Mobility Overal bed mobility: Needs Assistance Bed Mobility: Supine to Sit           General bed mobility comments: deferred session addressed bed level exercises and edema control  Transfers Overall transfer level: Needs assistance Equipment used: Rolling walker (2 wheeled)                  Balance Overall balance assessment: Needs assistance Sitting-balance support: No upper extremity supported;Feet supported Sitting balance-Leahy Scale: Good     Standing balance support: No upper extremity supported;During functional activity Standing balance-Leahy Scale: Fair Standing balance comment: stood at sink with min guard/supervision for grooming  tasks                           ADL either performed or assessed with clinical judgement   ADL Overall ADL's : Needs assistance/impaired                                       General ADL Comments: Session addressed management of RUE edema with retrograde massage and AROM exercises.      Vision   Vision Assessment?: No apparent visual deficits   Perception     Praxis      Cognition Arousal/Alertness: Awake/alert Behavior During Therapy: Flat affect Overall Cognitive Status: Impaired/Different from baseline Area of Impairment: Safety/judgement;Problem solving                         Safety/Judgement: Decreased awareness of safety   Problem Solving: Slow processing;Requires verbal cues General Comments: decreased R side awareness         Exercises Exercises: General Upper Extremity General Exercises - Upper Extremity Shoulder Flexion: AROM;Both;20 reps Elbow Flexion: AROM;Both;20 reps Elbow Extension: AROM;20 reps Wrist Flexion: AROM;20 reps Wrist Extension: AROM;Both;20 reps Digit Composite Flexion: AROM;20 reps   Shoulder Instructions       General Comments VSS RA, +1 pitting edema    Pertinent Vitals/ Pain       Pain Assessment: 0-10 Pain Score: 7  Pain Location: right arm and leg Pain Descriptors / Indicators: Grimacing;Sore Pain Intervention(s): Monitored  during session;Repositioned;Premedicated before session  Home Living                                          Prior Functioning/Environment              Frequency  Min 2X/week        Progress Toward Goals  OT Goals(current goals can now be found in the care plan section)  Progress towards OT goals: Progressing toward goals  Acute Rehab OT Goals Patient Stated Goal: have better use of RLE  OT Goal Formulation: With patient Time For Goal Achievement: 11/16/19 Potential to Achieve Goals: Good  Plan Discharge plan remains  appropriate;Frequency remains appropriate    Co-evaluation                 AM-PAC OT "6 Clicks" Daily Activity     Outcome Measure   Help from another person eating meals?: None Help from another person taking care of personal grooming?: None Help from another person toileting, which includes using toliet, bedpan, or urinal?: A Little Help from another person bathing (including washing, rinsing, drying)?: A Lot Help from another person to put on and taking off regular upper body clothing?: A Little Help from another person to put on and taking off regular lower body clothing?: A Lot 6 Click Score: 18    End of Session    OT Visit Diagnosis: Unsteadiness on feet (R26.81);Muscle weakness (generalized) (M62.81);Pain;Other (comment) Pain - Right/Left: Right Pain - part of body: Shoulder;Leg   Activity Tolerance Patient tolerated treatment well;Patient limited by pain   Patient Left in bed;with call bell/phone within reach;with bed alarm set   Nurse Communication Mobility status        Time: 1516-1530 OT Time Calculation (min): 14 min  Charges: OT General Charges $OT Visit: 1 Visit OT Treatments $Therapeutic Exercise: 8-22 mins $Massage:  (with retrograde massage.)  Minus Breeding, MSOT, OTR/L  Supplemental Rehabilitation Services  [phone]    Marius Ditch 11/07/2019, 4:50 PM

## 2019-11-07 NOTE — Progress Notes (Signed)
[facility]  PROGRESS NOTE  LENNIS KORB FFM:[id number] DOB: 1971-01-12 DOA: 10/27/2019 PCP: Lavone Orn, MD   Brief HPI:   49 year old male with history of Crohn's disease, PE not on anticoagulation, depression came to ED for evaluation of right upper and lower extremity weakness, pain.  Patient says he was drinking before coming to the hospital and got intoxicated from alcohol, cocaine.  He blacked out did not recall the events occurred later that night.  He apparently fell onto the brick floor kitchen at his friend's residence landing on right side of the body and right side of the face.  When he woke up he was unable to move his right leg or arm and had significant pain throughout upper and lower extremities.  He was brought to the ED.  In the ED CK was found to be greater than 50,000.  UDS was positive for cocaine.  CT head/maxillofacial/cervical spine without contrast were negative for acute intracranial abnormality.  MRI brain showed punctate acute infarcts in the right septal lobe and right cerebellum.  CT abdomen/pelvis was unremarkable.  Nephrology, neurology, orthopedic surgery was consulted. Patient underwent placement of right IJ tunneled catheter.  Hemodialysis was initiated.  Stroke work-up was completed.  He was started on hemodialysis likely had stroke from hypoperfusion event versus vasospasm from cocaine use.  Recommended EMG nerve conduction studies in 4 to 6 weeks outpatient.  Ortho was consulted and stated no concern for compartment syndrome.    Subjective   Patient seen during hemodialysis.  Denies any complaints.   Assessment/Plan:     1. Rhabdomyolysis-patient presented with CK greater than 50,000, it has slowly come down with bicarb drip.  At this time CK is down to 4,799. 2. Acute kidney injury/metabolic acidosis-likely from rhabdomyolysis as above.  Also patient was hypotensive which could have caused ischemic ATN.  Patient started on hemodialysis per  nephrology.  No signs of recovery yet. 3. Transaminitis-patient has significant elevated LFTs on admission.  Likely from underlying rhabdomyolysis, hypoperfusion.  Acetaminophen level is normal.  Resolved.  AST is 76, ALT 36. 4. 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  Neurology was consulted.  Pressure weakness likely peripheral nature need outpatient EMG.  Orthopedics was consulted for possible developing compartment syndrome likely acute neurologic pulses.  Recommended outpatient EMG if no improvement.  No concern for compartment syndrome.  Right arm venous duplex was negative for DVT. 5. Crohn's disease-patient follows with [facility].  He has been on Remicade monotherapy.  Remicade is currently on hold. 6. Substance/alcohol abuse-no history of alcohol withdrawal in the past.  UDS positive for cocaine.  Continue CIWA protocol.     COVID-19 Labs  Recent Labs    11/06/19 1349  FERRITIN 499*    Lab Results  Component Value Date   [place] NEGATIVE 10/27/2019     Scheduled medications:   . aspirin EC  81 mg Oral Daily  . calcium acetate  667 mg Oral TID WC  . calcium carbonate  400 mg of elemental calcium Oral BID  . Chlorhexidine Gluconate Cloth  6 each Topical Daily  . Chlorhexidine Gluconate Cloth  6 each Topical Q0600  . heparin  5,000 Units Subcutaneous Q8H  . influenza vac split quadrivalent PF  0.5 mL Intramuscular Once         CBG: No results for input(s): GLUCAP in the last 168 hours.  SpO2: 94 % O2 Flow Rate (L/min): 2 L/min    CBC: Recent Labs  Lab 11/03/19 0434 11/04/19 0504 11/05/19 0310 11/06/19 0532 11/07/19 0429  WBC 22.9* 21.1* 21.7* 16.7* 16.5*  NEUTROABS 13.3* 11.8* 13.1* 11.2* 12.0*  HGB 11.9* 10.7* 10.2* 9.6* 9.5*  HCT 35.3* 32.3* 31.4* 30.0* 29.1*  MCV 90.3 90.7 91.3 92.9 93.6   PLT 122* 132* 183 224 177    Basic Metabolic Panel: Recent Labs  Lab 11/03/19 0434 11/04/19 0504 11/05/19 0310 11/06/19 0532 11/07/19 0429  NA 130* 131* 131* 133* 131*  K 4.2 3.9 4.6 4.1 4.1  CL 96* 95* 96* 98 98  CO2 17* 20* 20* 25 20*  GLUCOSE 121* 119* 112* 116* 104*  BUN 103* 80* 106* 63* 85*  CREATININE 11.81* 9.90* 11.95* 8.73* 10.88*  CALCIUM 6.2* 6.5* 6.7* 7.3* 7.5*  MG 2.2 2.2 2.3 2.1 2.1  PHOS 9.6* 7.8* 8.7* 6.6* 7.8*     Liver Function Tests: Recent Labs  Lab 11/03/19 0434 11/04/19 0504 11/05/19 0310 11/06/19 0532 11/07/19 0429  AST 221* 162* 142* 93* 76*  ALT 137* 85* 59* 42 36  ALKPHOS 54 47 42 40 38  BILITOT 0.9 0.6 1.2 0.5 0.9  PROT 4.6* 4.5* 4.5* 4.3* 4.5*  ALBUMIN 2.1* 2.2* 2.1* 2.0* 2.0*     Antibiotics: Anti-infectives (From admission, onward)   Start     Dose/Rate Route Frequency Ordered Stop   10/30/19 1737  ceFAZolin (ANCEF) 2-4 GM/100ML-% IVPB       Note to Pharmacy: Desiree Hane   : cabinet override      10/30/19 1737 10/30/19 1740       DVT prophylaxis: Heparin  Code Status: Full code  Family Communication: No family at bedside    Status is: Inpatient  Dispo: The patient is from: Home              Anticipated d/c is to: Home              Anticipated d/c date is: 11/13/2019              Patient currently not medically stable for discharge.  Barrier to discharge-acute kidney injury, requiring hemodialysis     Consultants:  Nephrology  Neurology  Orthopedics  Procedures:  Hemodialysis   Objective   Vitals:   11/07/19 1200 11/07/19 1205 11/07/19 1237 11/07/19 1448  BP: 99/64 109/60 120/67 125/62  Pulse: 96 92 (!) 105 (!) 101  Resp: 12 14 16 16   Temp:  98.6 F (37 C) 98.7 F (37.1 C) 98.5 F (36.9 C)  TempSrc:  Oral Oral Oral  SpO2: 92% 92% 94% 94%  Weight: 104.5 kg     Height:        Intake/Output Summary (Last 24 hours) at 11/07/2019 1608 Last data filed at 11/07/2019 1205 Gross per 24 hour   Intake --  Output 1500 ml  Net -1500 ml    09/13 1901 - 09/15 0700 In: 939 [P.O.:924; I.V.:20] Out: -   Filed Weights   11/07/19 0655 11/07/19 0825 11/07/19 1200  Weight: 103.5 kg 105.9 kg 104.5 kg    Physical Examination:    General: Appears in no acute distress  Cardiovascular: S1-S2, regular, no murmur auscultated  Respiratory: Clear to auscultation bilaterally  Abdomen: Abdomen is soft, nontender, no organomegaly  Extremities: Right upper extremity edema noted  Neurologic: Alert, oriented x3, no focal deficit noted    Data Reviewed:   Recent Results (from the past 240 hour(s))  MRSA PCR Screening     Status: None   Collection Time: 10/29/19  1:44 PM   Specimen: Nasopharyngeal  Result Value Ref Range Status   MRSA by PCR NEGATIVE NEGATIVE Final    Comment:        The GeneXpert MRSA Assay (FDA approved for NASAL specimens only), is one component of a comprehensive MRSA colonization surveillance program. It is not intended to diagnose MRSA infection nor to guide or monitor treatment for MRSA infections. Performed at [facility], [place] [address], [place], [place] [address]     No results for input(s): LIPASE, AMYLASE in the last 168 hours. No results for input(s): AMMONIA in the last 168 hours.  Cardiac Enzymes: Recent Labs  Lab 11/03/19 0434 11/04/19 0504 11/05/19 0310 11/06/19 0532 11/07/19 0429  CKTOTAL 22,220* 13,538* 12,196* 6,180* 4,799*   BNP (last 3 results) No results for input(s): BNP in the last 8760 hours.  ProBNP (last 3 results) No results for input(s): PROBNP in the last 8760 hours.  Studies:  No results found.     Oswald Hillock   [facility] If 7PM-7AM, please contact night-coverage at [email or web address], Office  [phone]   11/07/2019, 4:08 PM  LOS: 11 days

## 2019-11-08 LAB — CBC WITH DIFFERENTIAL/PLATELET
Abs Immature Granulocytes: 0.92 K/uL — ABNORMAL HIGH (ref 0.00–0.07)
Basophils Absolute: 0.1 K/uL (ref 0.0–0.1)
Basophils Relative: 1 %
Eosinophils Absolute: 0.2 K/uL (ref 0.0–0.5)
Eosinophils Relative: 2 %
HCT: 28 % — ABNORMAL LOW (ref 39.0–52.0)
Hemoglobin: 9 g/dL — ABNORMAL LOW (ref 13.0–17.0)
Immature Granulocytes: 7 %
Lymphocytes Relative: 12 %
Lymphs Abs: 1.6 K/uL (ref 0.7–4.0)
MCH: 29.9 pg (ref 26.0–34.0)
MCHC: 32.1 g/dL (ref 30.0–36.0)
MCV: 93 fL (ref 80.0–100.0)
Monocytes Absolute: 0.9 K/uL (ref 0.1–1.0)
Monocytes Relative: 7 %
Neutro Abs: 10.1 K/uL — ABNORMAL HIGH (ref 1.7–7.7)
Neutrophils Relative %: 71 %
Platelets: 279 K/uL (ref 150–400)
RBC: 3.01 MIL/uL — ABNORMAL LOW (ref 4.22–5.81)
RDW: 14.5 % (ref 11.5–15.5)
WBC: 13.8 K/uL — ABNORMAL HIGH (ref 4.0–10.5)
nRBC: 0 % (ref 0.0–0.2)

## 2019-11-08 LAB — COMPREHENSIVE METABOLIC PANEL WITH GFR
ALT: 32 U/L (ref 0–44)
AST: 57 U/L — ABNORMAL HIGH (ref 15–41)
Albumin: 2 g/dL — ABNORMAL LOW (ref 3.5–5.0)
Alkaline Phosphatase: 42 U/L (ref 38–126)
Anion gap: 9 (ref 5–15)
BUN: 54 mg/dL — ABNORMAL HIGH (ref 6–20)
CO2: 25 mmol/L (ref 22–32)
Calcium: 7.9 mg/dL — ABNORMAL LOW (ref 8.9–10.3)
Chloride: 99 mmol/L (ref 98–111)
Creatinine, Ser: 8.48 mg/dL — ABNORMAL HIGH (ref 0.61–1.24)
GFR calc Af Amer: 8 mL/min — ABNORMAL LOW (ref >60)
GFR calc non Af Amer: 7 mL/min — ABNORMAL LOW (ref >60)
Glucose, Bld: 107 mg/dL — ABNORMAL HIGH (ref 70–99)
Potassium: 4 mmol/L (ref 3.5–5.1)
Sodium: 133 mmol/L — ABNORMAL LOW (ref 135–145)
Total Bilirubin: 0.6 mg/dL (ref 0.3–1.2)
Total Protein: 4.3 g/dL — ABNORMAL LOW (ref 6.5–8.1)

## 2019-11-08 LAB — GLUCOSE, CAPILLARY: Glucose-Capillary: 108 mg/dL — ABNORMAL HIGH (ref 70–99)

## 2019-11-08 LAB — CK: Total CK: 2968 U/L — ABNORMAL HIGH (ref 49–397)

## 2019-11-08 LAB — PARATHYROID HORMONE, INTACT (NO CA): PTH: 139 pg/mL — ABNORMAL HIGH (ref 15–65)

## 2019-11-08 MED ORDER — CHLORHEXIDINE GLUCONATE CLOTH 2 % EX PADS: 6.0000 | MEDICATED_PAD | Freq: Every day | CUTANEOUS | Status: DC

## 2019-11-08 NOTE — Progress Notes (Signed)
Inpatient Rehabilitation Admissions Coordinator  I have insurance approval for Cir but no bed available today. I met with patient at bedside and spoke with his wife by phone. I will let team know by tomorrow morning if there will be a CIR bed this week for admission. Other rehab venues may have to be explored. I will follow up tomorrow.  Danne Baxter, RN, MSN Rehab Admissions Coordinator [phone] 11/08/2019 12:12 PM

## 2019-11-08 NOTE — Progress Notes (Signed)
[facility] Progress Note  Subjective:   Last HD on 9/15 with 1.5 liters UF.  Had some unmeasured urine output yesterday but not much.  I asked the patient if he would like me to call his wife on speakerphone during/after our visit and he declined.   Review of systems:   Denies shortness of breath or chest pain  Denies n/v    Vitals:   11/07/19 2010 11/08/19 0031 11/08/19 0541 11/08/19 0849  BP: 139/77 136/68 127/67 (!) 128/52  Pulse: (!) 102 (!) 101 94 80  Resp: 17 16 15 16   Temp: 98.9 F (37.2 C) 98.3 F (36.8 C) 98.1 F (36.7 C) 97.7 F (36.5 C)  TempSrc: Oral Oral Oral Oral  SpO2: 97% 98% 97% 95%  Weight:   102.5 kg   Height:        Exam:    Gen adult male in bed in NAD   Chest clear bilat to bases; unlabored  Heart S1S2 no rub  Abd soft ntnd Ext RLE trace edema  Neuro is alert, Ox 3 provides hx and follows commands Psych no anxiety or agitation  ACCESS: R IJ TDC    Home meds:  - remicade 400 IV every 8 wks  - ciagra 50 prn/ claritin 10 prn  - prn's/ vitamins/ supplements     UDS + cocaine     CT abd 9/04 > Adrenals/Urinary Tract: Both adrenal glands appear normal. The kidneys and collecting system appear normal without evidence of urinary tract calculus or hydronephrosis. Bladder is unremarkable. Otherwise >> IMPRESSION: small portion of transverse colon and fat containing anterior umbilical hernia, however no definite evidence bowel obstruction or strangulation. Moderate amount of colonic stool. Patchy streaky airspace opacity at the right lung base which may be due to atelectasis and/or infectious etiology.     MRI brain 9/04 > IMPRESSION: 1. Symmetric restricted diffusion in the globi pallidi. This is most often seen with an acute toxic or metabolic insult (including carbon monoxide poisoning and drugs of abuse) or hypoxic-ischemic injury. No hemorrhage. 2. Punctate acute infarcts in the R occipital lobe and R cerebellum.    CXR 10/27/19 -  IMPRESSION: Low lung volumes with bibasilar atelectasis. No evidence of acute traumatic injury         Assessment/ Plan: 1. AKI - suspect rhabdomyolysis w/ swollen R leg/ arm, AKI and high K+ in pt who was unconscious on the ground for too long of a time. CPK > 50K.  AKI due to hypotension/ hypovolemia and rhabdomyolysis.  S/p TDC, HD #1 10/31/19.  HD #2 9/10 then HD #3 9/11 for volume and clearance - HD per MWF schedule this week - No signs of recovery yet   - spoke with renal navigator to initiate CLIP process to start looking for a spot at an outpatient unit as an AKI - we are still monitoring inpatient for renal recovery at this time  2. Rhabdomyolysis: d/t being unconscious/found down.  CK > 50,000.  LFTs coming down   3. Hypocalcemia: repletion with PO and IV, using 3.5 Ca bath in HD. 4. Hyperkalemia - K+ 7's on admit.  Improved with HD.  Off lokelma 5. Drug abuse  6. H/o Crohn's - hx colostomy 2011, reversal in 2012, on Remicaide 7. Hyperphosphatemia - improving with HD.  Have added calcium acetate with meals.  intact PTH pending 8. Anemia normocytic - some contribution from renal failure. IV iron x 3 doses with HD.  And anticipate need for ESA  Recent Labs  Lab 11/06/19 0532 11/06/19 0532 11/07/19 0429 11/08/19 0608  K 4.1   < > 4.1 4.0  BUN 63*   < > 85* 54*  CREATININE 8.73*   < > 10.88* 8.48*  CALCIUM 7.3*   < > 7.5* 7.9*  PHOS 6.6*  --  7.8*  --   HGB 9.6*   < > 9.5* 9.0*   < > = values in this interval not displayed.   Inpatient medications: . aspirin EC  81 mg Oral Daily  . calcium acetate  667 mg Oral TID WC  . calcium carbonate  400 mg of elemental calcium Oral BID  . Chlorhexidine Gluconate Cloth  6 each Topical Daily  . Chlorhexidine Gluconate Cloth  6 each Topical Q0600  . heparin  5,000 Units Subcutaneous Q8H  . influenza vac split quadrivalent PF  0.5 mL Intramuscular Once   . sodium chloride    . sodium chloride    . ferric gluconate  (FERRLECIT/NULECIT) IV Stopped (11/07/19 1302)   sodium chloride, sodium chloride, alteplase, heparin, HYDROmorphone (DILAUDID) injection, lidocaine (PF), lidocaine-prilocaine, ondansetron **OR** ondansetron (ZOFRAN) IV, pentafluoroprop-tetrafluoroeth, senna-docusate    Claudia Desanctis, MD 11/08/2019  12:03 PM

## 2019-11-08 NOTE — Progress Notes (Signed)
[facility]  PROGRESS NOTE  [name] IOX:[id number] DOB: 05-17-1970 DOA: 10/27/2019 PCP: Lavone Orn, MD   Brief HPI:   49 year old male with history of Crohn's disease, PE not on anticoagulation, depression came to ED for evaluation of right upper and lower extremity weakness, pain.  Patient says he was drinking before coming to the hospital and got intoxicated from alcohol, cocaine.  He blacked out did not recall the events occurred later that night.  He apparently fell onto the brick floor kitchen at his friend's residence landing on right side of the body and right side of the face.  When he woke up he was unable to move his right leg or arm and had significant pain throughout upper and lower extremities.  He was brought to the ED.  In the ED CK was found to be greater than 50,000.  UDS was positive for cocaine.  CT head/maxillofacial/cervical spine without contrast were negative for acute intracranial abnormality.  MRI brain showed punctate acute infarcts in the right septal lobe and right cerebellum.  CT abdomen/pelvis was unremarkable.  Nephrology, neurology, orthopedic surgery was consulted. Patient underwent placement of right IJ tunneled catheter.  Hemodialysis was initiated.  Stroke work-up was completed.  He was started on hemodialysis likely had stroke from hypoperfusion event versus vasospasm from cocaine use.  Recommended EMG nerve conduction studies in 4 to 6 weeks outpatient.  Ortho was consulted and stated no concern for compartment syndrome.    Subjective   Patient seen and examined, denies any pain.   Assessment/Plan:     1. Rhabdomyolysis-patient presented with CK greater than 50,000, it has slowly come down with bicarb drip.  At this time CK is down to 2,968.  Nephrology following. 2. Acute kidney injury/metabolic acidosis-likely from rhabdomyolysis as above.  Also patient was hypotensive which could have caused ischemic ATN.  Patient started on hemodialysis per  nephrology.  No signs of recovery yet. 3. Transaminitis-patient has significant elevated LFTs on admission.  Likely from underlying rhabdomyolysis, hypoperfusion.  Acetaminophen level is normal.  Resolved.  AST is 76, ALT 36. 4. 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  Neurology was consulted. Weakness likely peripheral nature need outpatient EMG.  Orthopedics was consulted for possible developing compartment syndrome likely acute neurologic pulses.  Recommended outpatient EMG if no improvement.  No concern for compartment syndrome.  Right arm venous duplex was negative for DVT. 5. Crohn's disease-patient follows with [facility].  He has been on Remicade monotherapy.  Remicade is currently on hold. 6. Substance/alcohol abuse-no history of alcohol withdrawal in the past.  UDS positive for cocaine.  Continue CIWA protocol.     COVID-19 Labs  Recent Labs    11/06/19 1349  FERRITIN 499*    Lab Results  Component Value Date   [place] NEGATIVE 10/27/2019     Scheduled medications:   . aspirin EC  81 mg Oral Daily  . calcium acetate  667 mg Oral TID WC  . calcium carbonate  400 mg of elemental calcium Oral BID  . Chlorhexidine Gluconate Cloth  6 each Topical Daily  . Chlorhexidine Gluconate Cloth  6 each Topical Q0600  . heparin  5,000 Units Subcutaneous Q8H  . influenza vac split quadrivalent PF  0.5 mL Intramuscular Once         CBG: No results for input(s): GLUCAP in the last 168 hours.  SpO2: 95 % O2 Flow Rate (L/min): 2 L/min    CBC: Recent Labs  Lab 11/04/19 0504 11/05/19 0310 11/06/19 0532 11/07/19 0429 11/08/19 0608  WBC 21.1* 21.7* 16.7* 16.5* 13.8*  NEUTROABS 11.8* 13.1* 11.2* 12.0* 10.1*  HGB 10.7* 10.2* 9.6* 9.5* 9.0*  HCT 32.3* 31.4* 30.0* 29.1* 28.0*  MCV 90.7 91.3 92.9 93.6 93.0  PLT 132*  183 224 271 335    Basic Metabolic Panel: Recent Labs  Lab 11/03/19 0434 11/03/19 0434 11/04/19 0504 11/05/19 0310 11/06/19 0532 11/07/19 0429 11/08/19 0608  NA 130*   < > 131* 131* 133* 131* 133*  K 4.2   < > 3.9 4.6 4.1 4.1 4.0  CL 96*   < > 95* 96* 98 98 99  CO2 17*   < > 20* 20* 25 20* 25  GLUCOSE 121*   < > 119* 112* 116* 104* 107*  BUN 103*   < > 80* 106* 63* 85* 54*  CREATININE 11.81*   < > 9.90* 11.95* 8.73* 10.88* 8.48*  CALCIUM 6.2*   < > 6.5* 6.7* 7.3* 7.5* 7.9*  MG 2.2  --  2.2 2.3 2.1 2.1  --   PHOS 9.6*  --  7.8* 8.7* 6.6* 7.8*  --    < > = values in this interval not displayed.     Liver Function Tests: Recent Labs  Lab 11/04/19 0504 11/05/19 0310 11/06/19 0532 11/07/19 0429 11/08/19 0608  AST 162* 142* 93* 76* 57*  ALT 85* 59* 42 36 32  ALKPHOS 47 42 40 38 42  BILITOT 0.6 1.2 0.5 0.9 0.6  PROT 4.5* 4.5* 4.3* 4.5* 4.3*  ALBUMIN 2.2* 2.1* 2.0* 2.0* 2.0*     Antibiotics: Anti-infectives (From admission, onward)   Start     Dose/Rate Route Frequency Ordered Stop   10/30/19 1737  ceFAZolin (ANCEF) 2-4 GM/100ML-% IVPB       Note to Pharmacy: Desiree Hane   : cabinet override      10/30/19 1737 10/30/19 1740       DVT prophylaxis: Heparin  Code Status: Full code  Family Communication: No family at bedside    Status is: Inpatient  Dispo: The patient is from: Home              Anticipated d/c is to: Home              Anticipated d/c date is: 11/13/2019              Patient currently not medically stable for discharge.  Barrier to discharge-acute kidney injury, requiring hemodialysis     Consultants:  Nephrology  Neurology  Orthopedics  Procedures:  Hemodialysis   Objective   Vitals:   11/07/19 2010 11/08/19 0031 11/08/19 0541 11/08/19 0849  BP: 139/77 136/68 127/67 (!) 128/52  Pulse: (!) 102 (!) 101 94 80  Resp: 17 16 15 16   Temp: 98.9 F (37.2 C) 98.3 F (36.8 C) 98.1 F (36.7 C) 97.7 F (36.5 C)  TempSrc:  Oral Oral Oral Oral  SpO2: 97% 98% 97% 95%  Weight:   102.5 kg   Height:        Intake/Output Summary (Last 24 hours) at 11/08/2019 1135 Last data filed at 11/08/2019 0600 Gross per 24 hour  Intake 0 ml  Output 1500 ml  Net -1500 ml    09/14 1901 - 09/16 0700 In: 0  Out: 1500   Filed Weights   11/07/19 0825 11/07/19 1200 11/08/19 0541  Weight:  105.9 kg 104.5 kg 102.5 kg    Physical Examination:    General-appears in no acute distress  Heart-S1-S2, regular, no murmur auscultated  Lungs-clear to auscultation bilaterally, no wheezing or crackles auscultated  Abdomen-soft, nontender, no organomegaly  Extremities-no edema in the lower extremities  Neuro-alert, oriented x3, no focal deficit noted    Data Reviewed:   Recent Results (from the past 240 hour(s))  MRSA PCR Screening     Status: None   Collection Time: 10/29/19  1:44 PM   Specimen: Nasopharyngeal  Result Value Ref Range Status   MRSA by PCR NEGATIVE NEGATIVE Final    Comment:        The GeneXpert MRSA Assay (FDA approved for NASAL specimens only), is one component of a comprehensive MRSA colonization surveillance program. It is not intended to diagnose MRSA infection nor to guide or monitor treatment for MRSA infections. Performed at [facility], [place] [address], [place], [place] [address]     No results for input(s): LIPASE, AMYLASE in the last 168 hours. No results for input(s): AMMONIA in the last 168 hours.  Cardiac Enzymes: Recent Labs  Lab 11/04/19 0504 11/05/19 0310 11/06/19 0532 11/07/19 0429 11/08/19 0608  CKTOTAL 13,538* 12,196* 6,180* 4,799* 2,968*   BNP (last 3 results) No results for input(s): BNP in the last 8760 hours.  ProBNP (last 3 results) No results for input(s): PROBNP in the last 8760 hours.  Studies:  No results found.     Oswald Hillock   [facility] If 7PM-7AM, please contact night-coverage at [email or web address], Office   [phone]   11/08/2019, 11:35 AM  LOS: 12 days

## 2019-11-08 NOTE — Progress Notes (Signed)
Physical Therapy Treatment Patient Details Name: [name] MRN: [id number] DOB: 1970/06/06 Today's Date: 11/08/2019    History of Present Illness 49 year old male presents with RUE and RLE weakness and pain. Pt was intoxicated and using drugs night prior to admit and fell and was on floor all night. Pt admitted with ARF and rhabdomyolysis.   Brain MRI-bilateral basal ganglia infarcts and posterior watershed infarcts on right. Pt started HD on 9/8.  PMH: PE, tobacco abuse, crohns disease, polysubstance abuse    PT Comments    Pt supine in bed on entry with R side UE and LE propped on pillows with edema. Pt agreeable to getting up with therapy to progress mobility, asked to go to the bathroom. Pt with increased R sided stiffness in addition to decreased sensation below calf and elbow. Started session with AAROM. Pt continues to be limited in safe mobility by decreased awareness of R side, and its deficits as well as safety associated with his deficits. Pt requires min physical assist with mobility however requires maximal multimodal cuing for R sided movement and safety. Pt continues to be an appropriate candidate for CIR given his multifactorial deficits in presence of his desire to return to PLOF and familial support.  PT will continue to follow acutely.   Follow Up Recommendations  CIR     Equipment Recommendations  Rolling walker with 5" wheels       Precautions / Restrictions Precautions Precautions: Fall Precaution Comments: foot drop on RLE Restrictions Weight Bearing Restrictions: No    Mobility  Bed Mobility Overal bed mobility: Needs Assistance Bed Mobility: Supine to Sit     Supine to sit: HOB elevated;Min guard Sit to supine: Min assist   General bed mobility comments: minA for management of R LE off bed, vc use of L LE to offweight hips to aid in movement of R LE, slight dizziness with coming to seated but quickly dissipates   Transfers Overall transfer level:  Needs assistance Equipment used: Rolling walker (2 wheeled) Transfers: Sit to/from Stand Sit to Stand: Min guard;Min assist         General transfer comment: min A for steadying with initial sit>stand, vc for hand placement, pt with min A for lowering to toilet, poor judgement of distance of toilet behind him with short sit despite vc for backing up further, min guard for subsequent sit>stand after rest break and into recliner, maximal vc for positioning  Ambulation/Gait Ambulation/Gait assistance: Min assist Gait Distance (Feet): 35 Feet Assistive device: Rolling walker (2 wheeled) Gait Pattern/deviations: Step-through pattern;Decreased step length - right;Decreased step length - left Gait velocity: decr Gait velocity interpretation: <1.31 ft/sec, indicative of household ambulator General Gait Details: min physical A for steadying, however maximal multimodal cues for awareness of R sided positioning, and with increasing R sided weakness with distance, after bout of R sided buckling asked pt if he wanted to go further and he replied yes, buckling increased with next few steps. PT pointed out that he probably should have said no due to his weakness. Pt in agreement. With return to recliner in room encourged multiple standing rest breaks to recover strength     Modified Rankin (Stroke Patients Only) Modified Rankin (Stroke Patients Only) Pre-Morbid Rankin Score: No symptoms Modified Rankin: Moderately severe disability     Balance Overall balance assessment: Needs assistance Sitting-balance support: No upper extremity supported;Feet supported Sitting balance-Leahy Scale: Good     Standing balance support: No upper extremity supported;During functional activity Standing balance-Leahy  Scale: Fair Standing balance comment: able to stand at [employer] with no UE support                             Cognition Arousal/Alertness: Awake/alert Behavior During Therapy: Flat  affect Overall Cognitive Status: Impaired/Different from baseline Area of Impairment: Safety/judgement;Problem solving;Awareness                         Safety/Judgement: Decreased awareness of safety Awareness: Emergent Problem Solving: Slow processing;Requires verbal cues General Comments: pt continues to have decreased awareness of his deficits, and safety. Decreased attention to his R side      Exercises General Exercises - Lower Extremity Ankle Circles/Pumps: AAROM;10 reps;Supine;Right Ankle Circles/Pumps Limitations: only flicker of DF Long Arc Quad: AROM;Right;Seated;10 reps (lacks full ext/flex) Heel Slides: AAROM;10 reps;Supine;Right Heel Slides Limitations: increased pain and stiffness in hip with heel slides     General Comments General comments (skin integrity, edema, etc.): HR increased to 130s with ambulation,       Pertinent Vitals/Pain Pain Assessment: 0-10 Pain Score: 7  Pain Location: right arm and leg Pain Descriptors / Indicators: Grimacing;Sore;Tingling;Pins and needles Pain Intervention(s): Limited activity within patient's tolerance;Monitored during session;Repositioned           PT Goals (current goals can now be found in the care plan section) Acute Rehab PT Goals PT Goal Formulation: With patient Time For Goal Achievement: 11/11/19 Potential to Achieve Goals: Good    Frequency    Min 4X/week      PT Plan Current plan remains appropriate       AM-PAC PT "6 Clicks" Mobility   Outcome Measure  Help needed turning from your back to your side while in a flat bed without using bedrails?: A Little Help needed moving from lying on your back to sitting on the side of a flat bed without using bedrails?: A Lot Help needed moving to and from a bed to a chair (including a wheelchair)?: A Little Help needed standing up from a chair using your arms (e.g., wheelchair or bedside chair)?: A Little Help needed to walk in hospital room?: A  Little Help needed climbing 3-5 steps with a railing? : A Lot 6 Click Score: 16    End of Session Equipment Utilized During Treatment: Gait belt Activity Tolerance: Patient tolerated treatment well Patient left: in chair;with call bell/phone within reach Nurse Communication: Mobility status PT Visit Diagnosis: Difficulty in walking, not elsewhere classified (R26.2);Muscle weakness (generalized) (M62.81)     Time: 2947-6546 PT Time Calculation (min) (ACUTE ONLY): 31 min  Charges:  $Gait Training: 8-22 mins $Therapeutic Exercise: 8-22 mins                     Jhovanny Guinta B. Migdalia Dk PT, DPT Acute Rehabilitation Services Pager [phone] Office [phone]    [place] 11/08/2019, 9:53 AM

## 2019-11-09 ENCOUNTER — Inpatient Hospital Stay (HOSPITAL_COMMUNITY)
Admission: RE | Admit: 2019-11-09 | Discharge: 2019-11-22 | DRG: 056 | Disposition: A | Discharge status: Home Health | Payer: BC Managed Care – PPO | Source: Intra-hospital | Attending: Physical Medicine & Rehabilitation | Admitting: Physical Medicine & Rehabilitation

## 2019-11-09 ENCOUNTER — Other Ambulatory Visit: Payer: Self-pay

## 2019-11-09 ENCOUNTER — Encounter (HOSPITAL_COMMUNITY): Payer: Self-pay | Admitting: Physical Medicine & Rehabilitation

## 2019-11-09 DIAGNOSIS — F1910 Other psychoactive substance abuse, uncomplicated: Secondary | ICD-10-CM | POA: Diagnosis not present

## 2019-11-09 DIAGNOSIS — I69319 Unspecified symptoms and signs involving cognitive functions following cerebral infarction: Secondary | ICD-10-CM

## 2019-11-09 DIAGNOSIS — K5900 Constipation, unspecified: Secondary | ICD-10-CM | POA: Diagnosis not present

## 2019-11-09 DIAGNOSIS — M4802 Spinal stenosis, cervical region: Secondary | ICD-10-CM | POA: Diagnosis not present

## 2019-11-09 DIAGNOSIS — D638 Anemia in other chronic diseases classified elsewhere: Secondary | ICD-10-CM | POA: Diagnosis present

## 2019-11-09 DIAGNOSIS — Z992 Dependence on renal dialysis: Secondary | ICD-10-CM

## 2019-11-09 DIAGNOSIS — D72829 Elevated white blood cell count, unspecified: Secondary | ICD-10-CM | POA: Diagnosis present

## 2019-11-09 DIAGNOSIS — N179 Acute kidney failure, unspecified: Secondary | ICD-10-CM | POA: Diagnosis present

## 2019-11-09 DIAGNOSIS — D6489 Other specified anemias: Secondary | ICD-10-CM | POA: Diagnosis present

## 2019-11-09 DIAGNOSIS — Z8371 Family history of colonic polyps: Secondary | ICD-10-CM

## 2019-11-09 DIAGNOSIS — T796XXS Traumatic ischemia of muscle, sequela: Secondary | ICD-10-CM

## 2019-11-09 DIAGNOSIS — F17210 Nicotine dependence, cigarettes, uncomplicated: Secondary | ICD-10-CM | POA: Diagnosis present

## 2019-11-09 DIAGNOSIS — F1410 Cocaine abuse, uncomplicated: Secondary | ICD-10-CM | POA: Diagnosis present

## 2019-11-09 DIAGNOSIS — Z7289 Other problems related to lifestyle: Secondary | ICD-10-CM

## 2019-11-09 DIAGNOSIS — N170 Acute kidney failure with tubular necrosis: Secondary | ICD-10-CM | POA: Diagnosis not present

## 2019-11-09 DIAGNOSIS — G629 Polyneuropathy, unspecified: Secondary | ICD-10-CM | POA: Diagnosis present

## 2019-11-09 DIAGNOSIS — G931 Anoxic brain damage, not elsewhere classified: Secondary | ICD-10-CM | POA: Diagnosis not present

## 2019-11-09 DIAGNOSIS — T405X1D Poisoning by cocaine, accidental (unintentional), subsequent encounter: Secondary | ICD-10-CM | POA: Diagnosis not present

## 2019-11-09 DIAGNOSIS — E871 Hypo-osmolality and hyponatremia: Secondary | ICD-10-CM | POA: Diagnosis not present

## 2019-11-09 DIAGNOSIS — D649 Anemia, unspecified: Secondary | ICD-10-CM | POA: Diagnosis not present

## 2019-11-09 DIAGNOSIS — E861 Hypovolemia: Secondary | ICD-10-CM | POA: Diagnosis not present

## 2019-11-09 DIAGNOSIS — I63111 Cerebral infarction due to embolism of right vertebral artery: Secondary | ICD-10-CM | POA: Diagnosis not present

## 2019-11-09 DIAGNOSIS — R34 Anuria and oliguria: Secondary | ICD-10-CM | POA: Diagnosis present

## 2019-11-09 DIAGNOSIS — K5901 Slow transit constipation: Secondary | ICD-10-CM | POA: Diagnosis not present

## 2019-11-09 DIAGNOSIS — Z79899 Other long term (current) drug therapy: Secondary | ICD-10-CM

## 2019-11-09 DIAGNOSIS — T796XXD Traumatic ischemia of muscle, subsequent encounter: Secondary | ICD-10-CM | POA: Diagnosis not present

## 2019-11-09 DIAGNOSIS — Z820 Family history of epilepsy and other diseases of the nervous system: Secondary | ICD-10-CM

## 2019-11-09 DIAGNOSIS — F1990 Other psychoactive substance use, unspecified, uncomplicated: Secondary | ICD-10-CM | POA: Diagnosis present

## 2019-11-09 DIAGNOSIS — N186 End stage renal disease: Secondary | ICD-10-CM

## 2019-11-09 DIAGNOSIS — R739 Hyperglycemia, unspecified: Secondary | ICD-10-CM | POA: Diagnosis present

## 2019-11-09 DIAGNOSIS — M6282 Rhabdomyolysis: Secondary | ICD-10-CM

## 2019-11-09 DIAGNOSIS — N5089 Other specified disorders of the male genital organs: Secondary | ICD-10-CM | POA: Diagnosis present

## 2019-11-09 DIAGNOSIS — K5090 Crohn's disease, unspecified, without complications: Secondary | ICD-10-CM | POA: Diagnosis present

## 2019-11-09 DIAGNOSIS — X58XXXD Exposure to other specified factors, subsequent encounter: Secondary | ICD-10-CM | POA: Diagnosis present

## 2019-11-09 DIAGNOSIS — T148XXD Other injury of unspecified body region, subsequent encounter: Secondary | ICD-10-CM

## 2019-11-09 DIAGNOSIS — M47897 Other spondylosis, lumbosacral region: Secondary | ICD-10-CM | POA: Diagnosis not present

## 2019-11-09 DIAGNOSIS — R945 Abnormal results of liver function studies: Secondary | ICD-10-CM | POA: Diagnosis not present

## 2019-11-09 DIAGNOSIS — F329 Major depressive disorder, single episode, unspecified: Secondary | ICD-10-CM | POA: Diagnosis present

## 2019-11-09 DIAGNOSIS — I69351 Hemiplegia and hemiparesis following cerebral infarction affecting right dominant side: Secondary | ICD-10-CM | POA: Diagnosis not present

## 2019-11-09 DIAGNOSIS — I639 Cerebral infarction, unspecified: Secondary | ICD-10-CM

## 2019-11-09 DIAGNOSIS — E875 Hyperkalemia: Secondary | ICD-10-CM | POA: Diagnosis not present

## 2019-11-09 LAB — BASIC METABOLIC PANEL
Anion gap: 10 (ref 5–15)
BUN: 74 mg/dL — ABNORMAL HIGH (ref 6–20)
CO2: 24 mmol/L (ref 22–32)
Calcium: 8 mg/dL — ABNORMAL LOW (ref 8.9–10.3)
Chloride: 98 mmol/L (ref 98–111)
Creatinine, Ser: 10.43 mg/dL — ABNORMAL HIGH (ref 0.61–1.24)
GFR calc Af Amer: 6 mL/min — ABNORMAL LOW (ref >60)
GFR calc non Af Amer: 5 mL/min — ABNORMAL LOW (ref >60)
Glucose, Bld: 106 mg/dL — ABNORMAL HIGH (ref 70–99)
Potassium: 4.2 mmol/L (ref 3.5–5.1)
Sodium: 132 mmol/L — ABNORMAL LOW (ref 135–145)

## 2019-11-09 LAB — CBC WITH DIFFERENTIAL/PLATELET
Abs Immature Granulocytes: 0.64 10*3/uL — ABNORMAL HIGH (ref 0.00–0.07)
Basophils Absolute: 0.1 10*3/uL (ref 0.0–0.1)
Basophils Relative: 1 %
Eosinophils Absolute: 0.2 10*3/uL (ref 0.0–0.5)
Eosinophils Relative: 1 %
HCT: 27.9 % — ABNORMAL LOW (ref 39.0–52.0)
Hemoglobin: 9.1 g/dL — ABNORMAL LOW (ref 13.0–17.0)
Immature Granulocytes: 5 %
Lymphocytes Relative: 11 %
Lymphs Abs: 1.4 10*3/uL (ref 0.7–4.0)
MCH: 30.6 pg (ref 26.0–34.0)
MCHC: 32.6 g/dL (ref 30.0–36.0)
MCV: 93.9 fL (ref 80.0–100.0)
Monocytes Absolute: 0.9 10*3/uL (ref 0.1–1.0)
Monocytes Relative: 7 %
Neutro Abs: 9.5 10*3/uL — ABNORMAL HIGH (ref 1.7–7.7)
Neutrophils Relative %: 75 %
Platelets: 318 10*3/uL (ref 150–400)
RBC: 2.97 MIL/uL — ABNORMAL LOW (ref 4.22–5.81)
RDW: 14.1 % (ref 11.5–15.5)
WBC: 12.6 10*3/uL — ABNORMAL HIGH (ref 4.0–10.5)
nRBC: 0 % (ref 0.0–0.2)

## 2019-11-09 LAB — CK: Total CK: 2456 U/L — ABNORMAL HIGH (ref 49–397)

## 2019-11-09 MED ORDER — SENNOSIDES-DOCUSATE SODIUM 8.6-50 MG PO TABS
1.0000 | ORAL_TABLET | Freq: Every day | ORAL | Status: DC
  Administered 2019-11-09 – 2019-11-11 (×3): 1 via ORAL
  Filled 2019-11-09 (×3): qty 1

## 2019-11-09 MED ORDER — ACETAMINOPHEN 500 MG PO TABS: 500.0000 mg | ORAL_TABLET | Freq: Three times a day (TID) | ORAL | 0 refills | Status: DC | PRN

## 2019-11-09 MED ORDER — POLYETHYLENE GLYCOL 3350 17 G PO PACK
17.0000 g | PACK | Freq: Every day | ORAL | Status: DC | PRN
  Administered 2019-11-10 – 2019-11-12 (×4): 17 g via ORAL
  Filled 2019-11-09 (×4): qty 1

## 2019-11-09 MED ORDER — GABAPENTIN 100 MG PO CAPS
100.0000 mg | ORAL_CAPSULE | Freq: Three times a day (TID) | ORAL | Status: DC
  Administered 2019-11-09 – 2019-11-22 (×33): 100 mg via ORAL
  Filled 2019-11-09 (×33): qty 1

## 2019-11-09 MED ORDER — CALCIUM CARBONATE ANTACID 500 MG PO CHEW
400.0000 mg | CHEWABLE_TABLET | Freq: Two times a day (BID) | ORAL | Status: DC
  Administered 2019-11-09 – 2019-11-13 (×8): 400 mg via ORAL
  Filled 2019-11-09 (×8): qty 2

## 2019-11-09 MED ORDER — MILK AND MOLASSES ENEMA
1.0000 | Freq: Every day | RECTAL | Status: DC | PRN
  Filled 2019-11-09: qty 240

## 2019-11-09 MED ORDER — GUAIFENESIN-DM 100-10 MG/5ML PO SYRP: 5.0000 mL | ORAL_SOLUTION | Freq: Four times a day (QID) | ORAL | Status: DC | PRN

## 2019-11-09 MED ORDER — HEPARIN SODIUM (PORCINE) 5000 UNIT/ML IJ SOLN
5000.0000 [IU] | Freq: Three times a day (TID) | INTRAMUSCULAR | Status: DC
  Administered 2019-11-09 – 2019-11-20 (×28): 5000 [IU] via SUBCUTANEOUS
  Filled 2019-11-09 (×28): qty 1

## 2019-11-09 MED ORDER — CALCIUM ACETATE (PHOS BINDER) 667 MG PO CAPS
667.0000 mg | ORAL_CAPSULE | Freq: Three times a day (TID) | ORAL | Status: DC
  Administered 2019-11-10 – 2019-11-13 (×9): 667 mg via ORAL
  Filled 2019-11-09 (×13): qty 1

## 2019-11-09 MED ORDER — LIDOCAINE-PRILOCAINE 2.5-2.5 % EX CREA: 1.0000 "application " | TOPICAL_CREAM | CUTANEOUS | Status: DC | PRN

## 2019-11-09 MED ORDER — DARBEPOETIN ALFA 40 MCG/0.4ML IJ SOSY
40.0000 ug | PREFILLED_SYRINGE | Freq: Once | INTRAMUSCULAR | Status: AC
  Administered 2019-11-09: 40 ug via SUBCUTANEOUS

## 2019-11-09 MED ORDER — HEPARIN SODIUM (PORCINE) 1000 UNIT/ML IJ SOLN
INTRAMUSCULAR | Status: AC
  Administered 2019-11-09: 3200 [IU] via INTRAVENOUS_CENTRAL
  Filled 2019-11-09: qty 4

## 2019-11-09 MED ORDER — LIDOCAINE HCL (PF) 1 % IJ SOLN
5.0000 mL | INTRAMUSCULAR | Status: DC | PRN
  Filled 2019-11-09: qty 5

## 2019-11-09 MED ORDER — ASPIRIN EC 81 MG PO TBEC
81.0000 mg | DELAYED_RELEASE_TABLET | Freq: Every day | ORAL | Status: DC
  Administered 2019-11-10 – 2019-11-22 (×13): 81 mg via ORAL
  Filled 2019-11-09 (×13): qty 1

## 2019-11-09 MED ORDER — CHLORHEXIDINE GLUCONATE CLOTH 2 % EX PADS
6.0000 | MEDICATED_PAD | Freq: Every day | CUTANEOUS | Status: DC
  Administered 2019-11-10 – 2019-11-22 (×12): 6 via TOPICAL

## 2019-11-09 MED ORDER — ASPIRIN 81 MG PO TBEC
81.0000 mg | DELAYED_RELEASE_TABLET | Freq: Every day | ORAL | 11 refills | Status: DC
Start: 2019-11-10 — End: 2021-04-14

## 2019-11-09 MED ORDER — BISACODYL 10 MG RE SUPP: 10.0000 mg | Freq: Every day | RECTAL | Status: DC | PRN

## 2019-11-09 MED ORDER — TRAZODONE HCL 50 MG PO TABS: 25.0000 mg | ORAL_TABLET | Freq: Every evening | ORAL | Status: DC | PRN

## 2019-11-09 MED ORDER — CALCIUM CARBONATE ANTACID 500 MG PO CHEW: 400.0000 mg | CHEWABLE_TABLET | Freq: Two times a day (BID) | ORAL | Status: DC

## 2019-11-09 MED ORDER — ACETAMINOPHEN 325 MG PO TABS
325.0000 mg | ORAL_TABLET | ORAL | Status: DC | PRN
  Administered 2019-11-09 – 2019-11-10 (×4): 650 mg via ORAL
  Filled 2019-11-09 (×5): qty 2

## 2019-11-09 MED ORDER — DIPHENHYDRAMINE HCL 12.5 MG/5ML PO ELIX: 12.5000 mg | ORAL_SOLUTION | Freq: Four times a day (QID) | ORAL | Status: DC | PRN

## 2019-11-09 MED ORDER — PENTAFLUOROPROP-TETRAFLUOROETH EX AERO: 1.0000 "application " | INHALATION_SPRAY | CUTANEOUS | Status: DC | PRN

## 2019-11-09 MED ORDER — PROCHLORPERAZINE EDISYLATE 10 MG/2ML IJ SOLN: 5.0000 mg | Freq: Four times a day (QID) | INTRAMUSCULAR | Status: DC | PRN

## 2019-11-09 MED ORDER — DARBEPOETIN ALFA 40 MCG/0.4ML IJ SOSY
PREFILLED_SYRINGE | INTRAMUSCULAR | Status: AC
  Filled 2019-11-09: qty 0.4

## 2019-11-09 MED ORDER — PROCHLORPERAZINE 25 MG RE SUPP: 12.5000 mg | Freq: Four times a day (QID) | RECTAL | Status: DC | PRN

## 2019-11-09 MED ORDER — HEPARIN SODIUM (PORCINE) 1000 UNIT/ML DIALYSIS: 1000.0000 [IU] | INTRAMUSCULAR | Status: DC | PRN

## 2019-11-09 MED ORDER — SODIUM CHLORIDE 0.9 % IV SOLN
125.0000 mg | INTRAVENOUS | Status: AC
  Administered 2019-11-12: 125 mg via INTRAVENOUS
  Filled 2019-11-09 (×2): qty 10

## 2019-11-09 MED ORDER — CALCIUM ACETATE (PHOS BINDER) 667 MG PO CAPS: 667.0000 mg | ORAL_CAPSULE | Freq: Three times a day (TID) | ORAL | Status: DC

## 2019-11-09 MED ORDER — PROCHLORPERAZINE MALEATE 5 MG PO TABS: 5.0000 mg | ORAL_TABLET | Freq: Four times a day (QID) | ORAL | Status: DC | PRN

## 2019-11-09 NOTE — Discharge Instructions (Signed)
Acute Kidney Injury, Adult  Acute kidney injury is a sudden worsening of kidney function. The kidneys are organs that have several jobs. They filter the blood to remove waste products and extra fluid. They also maintain a healthy balance of minerals and hormones in the body, which helps control blood pressure and keep bones strong. With this condition, your kidneys do not do their jobs as well as they should. This condition ranges from mild to severe. Over time it may develop into long-lasting (chronic) kidney disease. Early detection and treatment may prevent acute kidney injury from developing into a chronic condition. What are the causes? Common causes of this condition include:  A problem with blood flow to the kidneys. This may be caused by: ? Low blood pressure (hypotension) or shock. ? Blood loss. ? Heart and blood vessel (cardiovascular) disease. ? Severe burns. ? Liver disease.  Direct damage to the kidneys. This may be caused by: ? Certain medicines. ? A kidney infection. ? Poisoning. ? Being around or in contact with toxic substances. ? A surgical wound. ? A hard, direct hit to the kidney area.  A sudden blockage of urine flow. This may be caused by: ? Cancer. ? Kidney stones. ? An enlarged prostate in males. What are the signs or symptoms? Symptoms of this condition may not be obvious until the condition becomes severe. Symptoms of this condition can include:  Tiredness (lethargy), or difficulty staying awake.  Nausea or vomiting.  Swelling (edema) of the face, legs, ankles, or feet.  Problems with urination, such as: ? Abdominal pain, or pain along the side of your stomach (flank). ? Decreased urine production. ? Decrease in the force of urine flow.  Muscle twitches and cramps, especially in the legs.  Confusion or trouble concentrating.  Loss of appetite.  Fever. How is this diagnosed? This condition may be diagnosed with tests, including:  Blood  tests.  Urine tests.  Imaging tests.  A test in which a sample of tissue is removed from the kidneys to be examined under a microscope (kidney biopsy). How is this treated? Treatment for this condition depends on the cause and how severe the condition is. In mild cases, treatment may not be needed. The kidneys may heal on their own. In more severe cases, treatment will involve:  Treating the cause of the kidney injury. This may involve changing any medicines you are taking or adjusting your dosage.  Fluids. You may need specialized IV fluids to balance your body's needs.  Having a catheter placed to drain urine and prevent blockages.  Preventing problems from occurring. This may mean avoiding certain medicines or procedures that can cause further injury to the kidneys. In some cases treatment may also require:  A procedure to remove toxic wastes from the body (dialysis or continuous renal replacement therapy - CRRT).  Surgery. This may be done to repair a torn kidney, or to remove the blockage from the urinary system. Follow these instructions at home: Medicines  Take over-the-counter and prescription medicines only as told by your health care provider.  Do not take any new medicines without your health care provider's approval. Many medicines can worsen your kidney damage.  Do not take any vitamin and mineral supplements without your health care provider's approval. Many nutritional supplements can worsen your kidney damage. Lifestyle  If your health care provider prescribed changes to your diet, follow them. You may need to decrease the amount of protein you eat.  Achieve and maintain a healthy  weight. If you need help with this, ask your health care provider.  Start or continue an exercise plan. Try to exercise at least 30 minutes a day, 5 days a week.  Do not use any tobacco products, such as cigarettes, chewing tobacco, and e-cigarettes. If you need help quitting, ask your  health care provider. General instructions  Keep track of your blood pressure. Report changes in your blood pressure as told by your health care provider.  Stay up to date with immunizations. Ask your health care provider which immunizations you need.  Keep all follow-up visits as told by your health care provider. This is important. Where to find more information  American Association of Kidney Patients: BombTimer.gl  National Kidney Foundation: www.kidney.[place]: https://mathis.com/  [facility] Rehabilitation [facility]: ? www.lifeoptions.org ? www.kidneyschool.org Contact a health care provider if:  Your symptoms get worse.  You develop new symptoms. Get help right away if:  You develop symptoms of worsening kidney disease, which include: ? Headaches. ? Abnormally dark or light skin. ? Easy bruising. ? Frequent hiccups. ? Chest pain. ? Shortness of breath. ? End of menstruation in women. ? Seizures. ? Confusion or altered mental status. ? Abdominal or back pain. ? Itchiness.  You have a fever.  Your body is producing less urine.  You have pain or bleeding when you urinate. Summary  Acute kidney injury is a sudden worsening of kidney function.  Acute kidney injury can be caused by problems with blood flow to the kidneys, direct damage to the kidneys, and sudden blockage of urine flow.  Symptoms of this condition may not be obvious until it becomes severe. Symptoms may include edema, lethargy, confusion, nausea or vomiting, and problems passing urine.  This condition can usually be diagnosed with blood tests, urine tests, and imaging tests. Sometimes a kidney biopsy is done to diagnose this condition.  Treatment for this condition often involves treating the underlying cause. It is treated with fluids, medicines, dialysis, diet changes, or surgery. This information is not intended to replace advice given to you by your health care provider. Make  sure you discuss any questions you have with your health care provider. Document Revised: 01/21/2017 Document Reviewed: 01/30/2016 Elsevier Patient Education  [place].   Contrast-Induced Nephropathy Contrast-induced nephropathy (CIN) is a rare type of kidney damage caused by the dye that may be used in certain imaging tests. This dye is called a contrast medium. For these tests, the dye may be injected into a person's body to make tissue or blood vessels show up more clearly. A contrast medium may be used in imaging tests such as:  CT scans.  MRIs.  Angiograms. With CIN, the contrast medium may damage kidney cells directly. It may also cause a reaction in the kidneys that decreases blood supply and oxygen to the kidneys. Lack of blood and oxygen causes temporary kidney damage that usually gets better in about 2 weeks. Very rarely, severe damage may require the kidneys to be filtered by a machine (dialysis). What are the causes? The exact cause of CIN is not known. In some cases, there may be more than one cause. What increases the risk? The following factors may make you more likely to develop this condition:  Having kidney disease before you are exposed to contrast media. This is the main risk factor.  Having other conditions, such as: ? Dehydration. ? Diabetes. ? High blood pressure. ? Low blood pressure. ? Heart disease. ? Anemia. ?  A type of blood cancer that affects the kidneys (multiple myeloma).  Older age.  Needing an emergency exam that requires the use of contrast media.  Receiving a high dose of contrast media or receiving contrast media within 72 hours of the previous time you got it.  Having contrast media injected into a blood vessel that carries blood away from the heart (artery).  Being on a medicine that affects kidney function. These include NSAIDs, some antibiotics, and some cancer drugs. What are the signs or symptoms? Symptoms of this condition  often develop 2-3 days after getting contrast media. Symptoms may include:  Feeling tired (fatigue).  Appetite loss.  Swelling of the feet or ankles.  Puffy, swollen eyes.  Dry, itchy skin. In some cases, there are no symptoms. How is this diagnosed? This condition may be diagnosed based on:  Your symptoms and medical history. Your health care provider may suspect CIN if you recently had an imaging test or procedure that included the use of contrast media.  A physical exam.  Blood and urine tests to check for kidney damage. How is this treated? There is no specific treatment for CIN. If you develop CIN, you may be given fluids through an IV that is inserted into one of your veins. This will help keep your kidneys active. Electrolytes may be added to the fluid if you have an electrolyte imbalance. In most cases, CIN will get better in about 2 weeks. In rare cases, you may need kidney dialysis if kidney failure occurs and urine flow decreases. Even if you need dialysis, the condition usually improves over time. Follow these instructions at home:   Take over-the-counter and prescription medicines only as told by your health care provider. Over-the-counter NSAIDs, such as aspirin or ibuprofen, can increase your risk of CIN.  Return to your normal activities as told by your health care provider. Ask your health care provider what activities are safe for you.  Drink enough fluid to keep your urine pale yellow.  Always let a health care provider know that you have a history of CIN before having any test or procedure that may include contrast material.  Keep all follow-up visits as told by your health care provider. This is important. How is this prevented? Take the following steps to help reduce your risk for developing CIN:  Tell your health care provider about any risk factors you have for CIN.  Ask if a contrast medium is necessary for imaging tests or procedures you are scheduled  to have. Ask if there is an alternative to the test.  Make sure you drink plenty of fluids prior to any imaging tests or procedures that require contrast medium.  If possible, do not undergo multiple imaging tests or procedures that require contrast medium unless enough time has passed between tests. Contact a health care provider if:  You have any signs or symptoms of CIN.  You are urinating less than usual. Summary  Contrast-induced nephropathy (CIN) is a rare type of kidney damage caused by contrast medium.  The exact cause of CIN is not known. In some cases, there may be more than one cause.  Sometimes CIN does not cause any symptoms. If you do have symptoms, they may start 2-3 days after getting contrast medium.  To confirm the diagnosis, you may have blood tests and urine tests to check for kidney damage.  There is no specific treatment for CIN. In most cases, this condition will usually get better in about   2 weeks. You may be given IV fluids with electrolytes. In rare cases, dialysis is needed. This information is not intended to replace advice given to you by your health care provider. Make sure you discuss any questions you have with your health care provider. Document Revised: 05/23/2017 Document Reviewed: 05/23/2017 Elsevier Patient Education  [place].

## 2019-11-09 NOTE — Progress Notes (Signed)
Danny Gong, RN  Rehab Admission Coordinator  Physical Medicine and Rehabilitation  PMR Pre-admission     Addendum  Date of Service:  11/09/2019 11:37 AM      Related encounter: ED to Hosp-Admission (Discharged) from 10/27/2019 in [place] all [x]Manual[x]Template[x]Copied  Added by: [x][name], Danny Kelch, RN  []Hover for details PMR Admission Coordinator Pre-Admission Assessment   Patient: [name] is an 49 y.o., male MRN: [id number] DOB: 09/21/1970 Height: 5' 11" (180.3 cm) Weight: 103.8 kg                                                                                                                                                  Insurance Information HMO:     PPO: yes     PCP:      IPA:      80/20:      OTHER:  PRIMARY: [employer] [place]      Policy#: [id number]      Subscriber: pt CM Name: [name]      Phone#: via fax approval phone [phone]   Fax#: [phone] Pre-Cert#: [id number] approved until 11/21/2019 when updates are due      Employer:  Benefits:  Phone #: [phone]     Name:  Eff. Date: 02/23/2019     Deduct: $2500 ( met)     Out of Pocket Max: $6000 ( met $3329.70)  Life Max: none  CIR: 80%      SNF: 80% 60 days per year Outpatient: $50 PER VISIT    Co-Pay: 30 VISITS combined PT and OT; 30 visits SLP Home Health: 80%      Co-Pay: visits limited per medical neccesity DME: 80%     Co-Pay: 20% Providers: in network  SECONDARY: none      Policy#:       Phone#:    Development worker, community:       Phone#:    The Engineer, petroleum" for patients in Inpatient Rehabilitation Facilities with attached "Privacy Act [place] Records" was provided and verbally reviewed with: N/A   Emergency Contact Information         Contact Information     Name Relation Home Work Mobile    [place] Spouse [phone]   [phone]    [name],[name] Father [phone]         Current Medical  History  Patient Admitting Diagnosis: CVA's, AKI   History of Present Illness:49 y.o. male with history of Crohn's, PE, depression who was admitted on 10/27/19 with RUE/RLE weakness. Per reports, the night before the patient was intoxicated on alcohol, took a [name] and blacked out on a friend's brick floor and laid on this right side till the next morning. UDS positive for cocaine/ETOH level 24.  He work up with severe  pain right sided with swollen leg/arm, was hypotensive, had acute renal failure with rhabdomyolysis and treated with fluid boluses and Lokelma. MRI brain showed acute punctate infarcts in right occipital lobe and right cerebellum as well as restricted diffusion in globi pallidi. MRI Cspine/lumbar spine done due to weakness and showed mild right and left C4/5 foraminal stenosis and mild facet arthrosis L4-S1. Nephrology following for assistance with renal failure and HD started due to poor recovery.    Dr. Leonel Ramsay felt that weakness was likely a combination--peripheral in nature due to neurapraxia and rhabdomyolysis. Cerebral injury felt to be due to overdose and prolonged down time--question fentanyl/cocaine combo and supportive care recommended. Dr. Stann Mainland following for input on RUE/RLE edema and recommended monitoring for compartment syndrome.     Complete NIHSS TOTAL: 5 Glasgow Coma Scale Score: 15   Past Medical History      Past Medical History:  Diagnosis Date  . Allergy    . Crohn's colitis ([place])    . Depression    . Pulmonary embolism ([facility])        Family History  family history includes Colon polyps in his father; Multiple sclerosis in his sister.   Prior Rehab/Hospitalizations:  Has the patient had prior rehab or hospitalizations prior to admission? Yes   Has the patient had major surgery during 100 days prior to admission? Yes   Current Medications    Current Facility-Administered Medications:  .  0.9 %  sodium chloride infusion, 100 mL, Intravenous, PRN,  Madelon Lips, MD .  0.9 %  sodium chloride infusion, 100 mL, Intravenous, PRN, Madelon Lips, MD .  alteplase (CATHFLO ACTIVASE) injection 2 mg, 2 mg, Intracatheter, Once PRN, Madelon Lips, MD .  aspirin EC tablet 81 mg, 81 mg, Oral, Daily, Greta Doom, MD, 81 mg at 11/09/19 1138 .  calcium acetate (PHOSLO) capsule 667 mg, 667 mg, Oral, TID WC, Claudia Desanctis, MD, 667 mg at 11/09/19 1138 .  calcium carbonate (TUMS - dosed in mg elemental calcium) chewable tablet 400 mg of elemental calcium, 400 mg of elemental calcium, Oral, BID, Madelon Lips, MD, 400 mg of elemental calcium at 11/09/19 1140 .  Chlorhexidine Gluconate Cloth 2 % PADS 6 each, 6 each, Topical, Daily, Alma Friendly, MD, 6 each at 11/09/19 1141 .  Darbepoetin Alfa (ARANESP) 40 MCG/0.4ML injection, , , ,  .  ferric gluconate (NULECIT) 125 mg in sodium chloride 0.9 % 100 mL IVPB, 125 mg, Intravenous, Q M,W,F-HD, Harrie Jeans C, MD, Last Rate: 110 mL/hr at 11/09/19 0930, 125 mg at 11/09/19 0930 .  heparin injection 1,000 Units, 1,000 Units, Dialysis, PRN, Madelon Lips, MD, 3,200 Units at 11/09/19 1050 .  heparin injection 5,000 Units, 5,000 Units, Subcutaneous, Q8H, Lenore Cordia, MD, 5,000 Units at 11/09/19 0501 .  HYDROmorphone (DILAUDID) injection 0.5 mg, 0.5 mg, Intravenous, Q4H PRN, Zada Finders R, MD, 0.5 mg at 11/09/19 1138 .  influenza vac split quadrivalent PF (FLUARIX) injection 0.5 mL, 0.5 mL, Intramuscular, Once, Shawna Clamp, MD .  lidocaine (PF) (XYLOCAINE) 1 % injection 5 mL, 5 mL, Intradermal, PRN, Madelon Lips, MD .  lidocaine-prilocaine (EMLA) cream 1 application, 1 application, Topical, PRN, Madelon Lips, MD .  ondansetron [facility]) tablet 4 mg, 4 mg, Oral, Q6H PRN **OR** ondansetron (ZOFRAN) injection 4 mg, 4 mg, Intravenous, Q6H PRN, Lenore Cordia, MD .  pentafluoroprop-tetrafluoroeth (GEBAUERS) aerosol 1 application, 1 application, Topical, PRN, Madelon Lips, MD .   senna-docusate (Senokot-S) tablet 1 tablet, 1 tablet, Oral,  QHS PRN, Lenore Cordia, MD, 1 tablet at 11/06/19 0012   Facility-Administered Medications Ordered in Other Encounters:  .  loratadine (CLARITIN) tablet 10 mg, 10 mg, Oral, Daily, Garlan Fair, MD   Patients Current Diet:     Diet Order                      Diet renal with fluid restriction Fluid restriction: Other (see comments); Room service appropriate? Yes with Assist; Fluid consistency: Thin  Diet effective now                      Precautions / Restrictions Precautions Precautions: Fall Precaution Comments: foot drop on RLE Restrictions Weight Bearing Restrictions: No    Has the patient had 2 or more falls or a fall with injury in the past year?Yes   Prior Activity Level Community (5-7x/wk): Independent, working and driving   Prior Functional Level Prior Function Level of Independence: Independent   Self Care: Did the patient need help bathing, dressing, using the toilet or eating?  Independent   Indoor Mobility: Did the patient need assistance with walking from room to room (with or without device)? Independent   Stairs: Did the patient need assistance with internal or external stairs (with or without device)? Independent   Functional Cognition: Did the patient need help planning regular tasks such as shopping or remembering to take medications? Independent   Home Assistive Devices / Equipment Home Assistive Devices/Equipment: None Home Equipment: None   Prior Device Use: Indicate devices/aids used by the patient prior to current illness, exacerbation or injury? None of the above   Current Functional Level Cognition   Arousal/Alertness: Awake/alert Overall Cognitive Status: Impaired/Different from baseline Orientation Level: Oriented X4 Safety/Judgement: Decreased awareness of safety General Comments: pt continues to have decreased awareness of his deficits, and safety. Decreased attention  to his R side Attention: Sustained Sustained Attention: Impaired Sustained Attention Impairment: Verbal complex Memory: Impaired (3/5 words independently recalled, 2 of 5 with category cues) Memory Impairment: Retrieval deficit, Other (comment) (decreased working memory) Problem Solving: Impaired Problem Solving Impairment: Functional complex Safety/Judgment: Appears intact    Extremity Assessment (includes Sensation/Coordination)   Upper Extremity Assessment: RUE deficits/detail RUE Deficits / Details: c/o pain with end range elbow flexion and Shoulder flexion.  R UE MMT grossly  3+/5.  RUE WNL's AROM and MMT RUE Sensation: decreased light touch RUE Coordination: decreased fine motor (patient reports better than previous day)  Lower Extremity Assessment: Defer to PT evaluation RLE Deficits / Details: AAROM right knee to 90 degrees, hip flexion to 90 degrees, unable to df --all ROM limited by pain and edema. strength grossly 2/5 RLE: Unable to fully assess due to pain     ADLs   Overall ADL's : Needs assistance/impaired Eating/Feeding: Set up, Sitting Grooming: Min guard, Standing, Cueing for safety Grooming Details (indicate cue type and reason): washed hands and brushed teeth standing at sink with RW Upper Body Bathing: Supervision/ safety, Set up, Minimal assistance, Sitting Lower Body Bathing: Supervison/ safety, Set up, Min guard, Moderate assistance, Sit to/from stand Lower Body Bathing Details (indicate cue type and reason): RLE deficits and decreased safety with RW Upper Body Dressing : Supervision/safety, Set up, Moderate assistance, Sitting Lower Body Dressing: Supervision/safety, Set up, Maximal assistance Lower Body Dressing Details (indicate cue type and reason): R hip pain and dcreased AROM to RLE.  R shoulder soreness and numbness to fingertips.  Unable to reach B feet.  Toilet Transfer: Min guard, Supervision/safety, Regular Toilet, Grab bars, Surveyor, mining Details  (indicate cue type and reason): cues for standing completely in front of toilet before sitting Functional mobility during ADLs: Min guard, Cueing for safety, Cueing for sequencing, Rolling walker General ADL Comments: Session addressed management of RUE edema with retrograde massage and AROM exercises.      Mobility   Overal bed mobility: Needs Assistance Bed Mobility: Supine to Sit Supine to sit: HOB elevated, Min guard Sit to supine: Min assist General bed mobility comments: minA for management of R LE off bed, vc use of L LE to offweight hips to aid in movement of R LE, slight dizziness with coming to seated but quickly dissipates      Transfers   Overall transfer level: Needs assistance Equipment used: Rolling walker (2 wheeled) Transfers: Sit to/from Stand Sit to Stand: Min guard, Min assist Stand pivot transfers: Supervision, Min guard General transfer comment: min A for steadying with initial sit>stand, vc for hand placement, pt with min A for lowering to toilet, poor judgement of distance of toilet behind him with short sit despite vc for backing up further, min guard for subsequent sit>stand after rest break and into recliner, maximal vc for positioning     Ambulation / Gait / Stairs / Wheelchair Mobility   Ambulation/Gait Ambulation/Gait assistance: Herbalist (Feet): 35 Feet Assistive device: Rolling walker (2 wheeled) Gait Pattern/deviations: Step-through pattern, Decreased step length - right, Decreased step length - left General Gait Details: min physical A for steadying, however maximal multimodal cues for awareness of R sided positioning, and with increasing R sided weakness with distance, after bout of R sided buckling asked pt if he wanted to go further and he replied yes, buckling increased with next few steps. PT pointed out that he probably should have said no due to his weakness. Pt in agreement. With return to recliner in room encourged multiple standing  rest breaks to recover strength  Gait velocity: decr Gait velocity interpretation: <1.31 ft/sec, indicative of household ambulator     Posture / Balance Balance Overall balance assessment: Needs assistance Sitting-balance support: No upper extremity supported, Feet supported Sitting balance-Leahy Scale: Good Standing balance support: No upper extremity supported, During functional activity Standing balance-Leahy Scale: Fair Standing balance comment: able to stand at [facility] with no UE support      Special needs/care consideration New to hemodialysis this admission; AKI with outpatient dialysis center setup to be arranged by Terri Piedra, SW Visitor is wife, [name]      Previous Home Environment  Living Arrangements: Spouse/significant other, Children  Lives With: Spouse Available Help at Discharge: Family, Available PRN/intermittently Type of Home: House Home Layout: One level Home Access: Stairs to enter [employer] of Steps: 2-3 Bathroom Shower/Tub: Tub/shower unit, Multimedia programmer: Standard Bathroom Accessibility: Yes How Accessible: Accessible via walker [place]: No   Discharge Living Setting Plans for Discharge Living Setting: Patient's home, Lives with (comment) (wife and 44 year old daughter) Type of Home at Discharge: House Discharge Home Layout: One level Discharge Home Access: Stairs to enter Entrance Stairs-Rails: None Entrance Stairs-Number of Steps: 2-3 Discharge Bathroom Shower/Tub: Tub/shower unit, Walk-in shower Discharge Bathroom Toilet: Standard Discharge Bathroom Accessibility: Yes How Accessible: Accessible via walker Does the patient have any problems obtaining your medications?: No   Social/Family/Support Systems Patient Roles: Spouse, Parent (employee) Contact Information: wife, [name] Anticipated Caregiver: wife Anticipated Caregiver's Contact Information: see above Ability/Limitations of Caregiver: works  days Caregiver Availability: Intermittent Discharge Plan Discussed with Primary Caregiver: Yes Is Caregiver In Agreement with Plan?: Yes Does Caregiver/Family have Issues with Lodging/Transportation while Pt is in Rehab?: No   Goals Patient/Family Goal for Rehab: Mod I with PT and OT Expected length of stay: ELOS 7 to 10 days Additional Information: New to hemodialysis Pt/Family Agrees to Admission and willing to participate: Yes Program Orientation Provided & Reviewed with Pt/Caregiver Including Roles  & Responsibilities: Yes   Decrease burden of Care through IP rehab admission: n/a   Possible need for SNF placement upon discharge:not anticipated   Patient Condition: This patient's medical and functional status has changed since the consult dated: 11/01/2019 in which the Rehabilitation Physician determined and documented that the patient's condition is appropriate for intensive rehabilitative care in an inpatient rehabilitation facility. See "History of Present Illness" (above) for medical update. Functional changes are: overall min to mod assist. Patient's medical and functional status update has been discussed with the Rehabilitation physician and patient remains appropriate for inpatient rehabilitation. Will admit to inpatient rehab today.   Preadmission Screen Completed By:  Danny Burke, RN, 11/09/2019 11:50 AM ______________________________________________________________________   Discussed status with Dr. Naaman Plummer on 11/09/2019 at  43 and received approval for admission today.   Admission Coordinator:  [name], time 8413 Date 11/09/2019         Cosigned by: Danny Staggers, MD at 11/09/2019 11:53 AM  Revision History                          Note Details  Author Danny Gong, RN File Time 11/09/2019 11:50 AM  Author Type Rehab Admission Coordinator Status Addendum  Last Editor Danny Gong, RN Service Physical Medicine and  [place] # [id number] Admit Date 11/09/2019

## 2019-11-09 NOTE — TOC Transition Note (Signed)
Transition of Care [facility]) - CM/SW Discharge Note   Patient Details  Name: [name] MRN: [id number] Date of Birth: February 24, 1970  Transition of Care [facility]) CM/SW Contact:  Bethena Roys, RN Phone Number: 11/09/2019, 2:44 PM   Clinical Narrative: Insurance approval received for this patient. Patient has bed available at [facility] today. Patient will transition to 4W today. No further needs identified from Case Manager    Final next level of care: [place] Barriers to Discharge: No Barriers Identified   Patient Goals and CMS Choice Patient states their goals for this hospitalization and ongoing recovery are:: to go to CIR or SNF wil discuss with spouse   Choice offered to / list presented to : Patient   Discharge Plan and Services   Discharge Planning Services: CM Consult Post Acute Care Choice: Home Health, Durable Medical Equipment          DME Arranged: 3-N-1, Walker rolling DME Agency: [employer] Date DME Agency Contacted: 10/31/19 Time DME Agency Contacted: [phone] Representative spoke with at DME Agency: [place]: PT [place]: [place] Date [place]: 10/31/19 Time [place]: 68 Representative spoke with at [place]: Adela Lank   Readmission Risk Interventions No flowsheet data found.

## 2019-11-09 NOTE — Progress Notes (Signed)
Orthopedic Tech Progress Note Patient Details:  [name] [birth year] [id number] Called in order to [facility] for a REHAB PRAFO BOOT Patient ID: [name], male   DOB: Mar 22, 1970, 49 y.o.   MRN: [id number]   [name] 11/09/2019, 4:41 PM

## 2019-11-09 NOTE — Progress Notes (Addendum)
Inpatient Rehabilitation Admissions Coordinator  I have insurance approval and Cir bed to admit patient to today. I have notified Dr. Darrick Meigs, acute and TOC teams. I spoke with his wife by phone and she is in agreement. I will meet with patient in hemodialysis and make the arrangements to admit today.  Danne Baxter, RN, MSN Rehab Admissions Coordinator [phone] 11/09/2019 10:07 AM   I met with patient at bedside in hemodialysis and he is in agreement. I notified Hemodialysis of planned rehab admit.  Danne Baxter, RN, MSN Rehab Admissions Coordinator [phone] 11/09/2019 11:10 AM

## 2019-11-09 NOTE — Progress Notes (Signed)
Physical Therapy Treatment Patient Details Name: [name] MRN: [id number] DOB: 07-Jul-1970 Today's Date: 11/09/2019    History of Present Illness 49 year old male presents with RUE and RLE weakness and pain. Pt was intoxicated and using drugs night prior to admit and fell and was on floor all night. Pt admitted with ARF and rhabdomyolysis.   Brain MRI-bilateral basal ganglia infarcts and posterior watershed infarcts on right. Pt started HD on 9/8.  PMH: PE, tobacco abuse, crohns disease, polysubstance abuse    PT Comments    Pt sitting up in recliner having just finished lunch. Pt agreeable to work with therapy however notes he is very tired after HD. Pt reports continuation of improved sensation distally today in both R UE and R LE. Pt continues to be limited in safe mobility by decreased safety awareness, in particular R sided neglect/unawareness. Pt is min guard for transfers and min A for ambulation of 18 feet. Pt with increased R knee instability at 10 feet ambulation. Pt to d/c to CIR this afternoon.    Follow Up Recommendations  CIR     Equipment Recommendations  Rolling walker with 5" wheels    Recommendations for Other Services       Precautions / Restrictions Precautions Precautions: Fall Precaution Comments: foot drop on RLE    Mobility  Bed Mobility   General bed mobility comments: sitting in recliner on entry   Transfers Overall transfer level: Needs assistance Equipment used: Rolling walker (2 wheeled) Transfers: Sit to/from [facility] Sit to Stand: Min guard        General transfer comment: slightly impulsive with getting up min guard for safety, vc for backing up to recliner and reaching back to armrest, pt moving too quickly to comply  Ambulation/Gait Ambulation/Gait assistance: Min assist;Min guard Gait Distance (Feet): 18 Feet Assistive device: Rolling walker (2 wheeled) Gait Pattern/deviations: Step-through pattern Gait  velocity: decr Gait velocity interpretation: <1.31 ft/sec, indicative of household ambulator General Gait Details: min guard progressing to min A for steadying as R knee buckling increased with gait vc for awareness to R knee buckling      Modified Rankin (Stroke Patients Only) Modified Rankin (Stroke Patients Only) Pre-Morbid Rankin Score: No symptoms Modified Rankin: Moderately severe disability     Balance Overall balance assessment: Needs assistance Sitting-balance support: No upper extremity supported;Feet supported Sitting balance-Leahy Scale: Good     Standing balance support: No upper extremity supported;During functional activity Standing balance-Leahy Scale: Fair Standing balance comment: able to stand at [employer] with no UE support                             Cognition Arousal/Alertness: Awake/alert Behavior During Therapy: Flat affect Overall Cognitive Status: Impaired/Different from baseline Area of Impairment: Safety/judgement;Problem solving;Awareness;Following commands                       Following Commands: Follows one step commands with increased time;Follows multi-step commands with increased time Safety/Judgement: Decreased awareness of safety Awareness: Emergent Problem Solving: Slow processing;Requires verbal cues General Comments: worked with pt on R side attention especially with R foot placement       Exercises General Exercises - Lower Extremity Ankle Circles/Pumps: AROM;AAROM;Both;10 reps;Seated Straight Leg Raises: AAROM;Right;5 reps;Seated Hip Flexion/Marching: AAROM;Right;5 reps;Supine    General Comments General comments (skin integrity, edema, etc.): HR in 110s with ambulation, pt with increased sensation in R calf continues to have no  feeling in toes or dorsum of R foot, pins and needles in R finger tips and improved sensation in upper arm      Pertinent Vitals/Pain Pain Assessment: 0-10 Pain Score: 6  Pain  Location: RLE Pain Descriptors / Indicators: Grimacing;Sore;Tingling;Pins and needles Pain Intervention(s): Limited activity within patient's tolerance;Monitored during session;Repositioned           PT Goals (current goals can now be found in the care plan section) Acute Rehab PT Goals Patient Stated Goal: have better use of RLE  PT Goal Formulation: With patient Time For Goal Achievement: 11/11/19 Potential to Achieve Goals: Good Progress towards PT goals: Progressing toward goals    Frequency    Min 4X/week      PT Plan Current plan remains appropriate       AM-PAC PT "6 Clicks" Mobility   Outcome Measure  Help needed turning from your back to your side while in a flat bed without using bedrails?: A Little Help needed moving from lying on your back to sitting on the side of a flat bed without using bedrails?: A Lot Help needed moving to and from a bed to a chair (including a wheelchair)?: A Little Help needed standing up from a chair using your arms (e.g., wheelchair or bedside chair)?: A Little Help needed to walk in hospital room?: A Little Help needed climbing 3-5 steps with a railing? : A Lot 6 Click Score: 16    End of Session Equipment Utilized During Treatment: Gait belt Activity Tolerance: Patient tolerated treatment well Patient left: in chair;with call bell/phone within reach Nurse Communication: Mobility status PT Visit Diagnosis: Difficulty in walking, not elsewhere classified (R26.2);Muscle weakness (generalized) (M62.81)     Time: 0960-4540 PT Time Calculation (min) (ACUTE ONLY): 19 min  Charges:  $Gait Training: 8-22 mins                     Reedy Biernat B. Migdalia Dk PT, DPT Acute Rehabilitation Services Pager [phone] Office [phone]    [place] 11/09/2019, 3:56 PM

## 2019-11-09 NOTE — Progress Notes (Signed)
[facility] Progress Note  Subjective:   Seen and examined on dialysis.  Blood pressure 102/59 and HR 92.  Tolerating goal.  RIJ tunn catheter. Feels ok.  He has been approved for CIR.  He agrees to aranesp.   Review of systems:   Denies shortness of breath or chest pain  Denies n/v    Vitals:   11/09/19 0830 11/09/19 0900 11/09/19 0930 11/09/19 1000  BP: (!) 104/58 92/64 98/62  101/62  Pulse: 89 (!) 101 84 89  Resp:      Temp:      TempSrc:      SpO2:      Weight:      Height:        Exam:    Gen adult male in bed in NAD   Chest clear bilat to bases; unlabored  Heart S1S2 no rub  Abd soft ntnd Ext RLE 1+ edema ; no left leg edema  Neuro is alert, Ox 3 provides hx and follows commands Psych no anxiety or agitation  ACCESS: R IJ TDC    Home meds:  - remicade 400 IV every 8 wks  - ciagra 50 prn/ claritin 10 prn  - prn's/ vitamins/ supplements     UDS + cocaine     CT abd 9/04 > Adrenals/Urinary Tract: Both adrenal glands appear normal. The kidneys and collecting system appear normal without evidence of urinary tract calculus or hydronephrosis. Bladder is unremarkable. Otherwise >> IMPRESSION: small portion of transverse colon and fat containing anterior umbilical hernia, however no definite evidence bowel obstruction or strangulation. Moderate amount of colonic stool. Patchy streaky airspace opacity at the right lung base which may be due to atelectasis and/or infectious etiology.     MRI brain 9/04 > IMPRESSION: 1. Symmetric restricted diffusion in the globi pallidi. This is most often seen with an acute toxic or metabolic insult (including carbon monoxide poisoning and drugs of abuse) or hypoxic-ischemic injury. No hemorrhage. 2. Punctate acute infarcts in the R occipital lobe and R cerebellum.    CXR 10/27/19 - IMPRESSION: Low lung volumes with bibasilar atelectasis. No evidence of acute traumatic injury         Assessment/ Plan: 1. AKI - suspect  rhabdomyolysis w/ swollen R leg/ arm, AKI and high K+ in pt who was unconscious on the ground for too long of a time. CPK > 50K.  AKI due to hypotension/ hypovolemia and rhabdomyolysis.  S/p TDC, HD #1 10/31/19.  HD #2 9/10 then HD #3 9/11 for volume and clearance - HD per MWF schedule this week - No signs of recovery yet   - spoke with renal navigator to initiate CLIP process to start looking for a spot at an outpatient unit as an AKI - we are still monitoring inpatient for renal recovery at this time  2. Rhabdomyolysis: d/t being unconscious/found down.  CK > 50,000.  LFTs coming down   3. Hypocalcemia: repletion with PO and IV.  3.5 cath bath.   4. Hyperkalemia - K+ 7's on admit.  Improved with HD.  Off lokelma 5. Drug abuse  - counseled re: importance of cessation  6. H/o Crohn's - hx colostomy 2011, reversal in 2012, on Remicaide 7. Hyperphosphatemia - improving with HD.  Have added calcium acetate with meals.  intact PTH 139 8. Anemia normocytic - some contribution from renal failure. IV iron x 3 doses with HD. aranesp 40 mcg once on 9/17  dispo - he has been approved for CIR  Recent Labs  Lab 11/06/19 0532 11/06/19 0532 11/07/19 0429 11/07/19 0429 11/08/19 0608 11/09/19 0436  K 4.1   < > 4.1   < > 4.0 4.2  BUN 63*   < > 85*   < > 54* 74*  CREATININE 8.73*   < > 10.88*   < > 8.48* 10.43*  CALCIUM 7.3*   < > 7.5*   < > 7.9* 8.0*  PHOS 6.6*  --  7.8*  --   --   --   HGB 9.6*   < > 9.5*   < > 9.0* 9.1*   < > = values in this interval not displayed.   Inpatient medications: . aspirin EC  81 mg Oral Daily  . calcium acetate  667 mg Oral TID WC  . calcium carbonate  400 mg of elemental calcium Oral BID  . Chlorhexidine Gluconate Cloth  6 each Topical Daily  . heparin  5,000 Units Subcutaneous Q8H  . influenza vac split quadrivalent PF  0.5 mL Intramuscular Once   . sodium chloride    . sodium chloride    . ferric gluconate (FERRLECIT/NULECIT) IV 125 mg (11/09/19 0930)    sodium chloride, sodium chloride, alteplase, heparin, HYDROmorphone (DILAUDID) injection, lidocaine (PF), lidocaine-prilocaine, ondansetron **OR** ondansetron (ZOFRAN) IV, pentafluoroprop-tetrafluoroeth, senna-docusate    Claudia Desanctis, MD 11/09/2019  10:36 AM

## 2019-11-09 NOTE — Discharge Summary (Signed)
Physician Discharge Summary  [name] QJF:[id number] DOB: 1970/06/05 DOA: 10/27/2019  PCP: Lavone Orn, MD  Admit date: 10/27/2019 Discharge date: 11/09/2019  Time spent: 50 minutes  Recommendations for Outpatient Follow-up:  1. Patient was discharged to inpatient rehab 2. Follow-up neurology for outpatient EMG of right upper extremity  Discharge Diagnoses:  Principal Problem:   Acute renal failure due to rhabdomyolysis [facility]) Active Problems:   Hyperkalemia   Transaminitis   High anion gap metabolic acidosis   Alcohol use   Substance use disorder   Acute cerebral infarction (HCC)   Hypotension   Crohn's disease (HCC)   Lactic acidosis   Discharge Condition: Stable  Diet recommendation: Renal diet  Filed Weights   11/09/19 0450 11/09/19 0700 11/09/19 1046  Weight: 103.5 kg 106.2 kg 103.8 kg    History of present illness:  49 year old male with history of Crohn's disease, PE not on anticoagulation, depression came to ED for evaluation of right upper and lower extremity weakness, pain.  Patient says he was drinking before coming to the hospital and got intoxicated from alcohol, cocaine.  He blacked out did not recall the events occurred later that night.  He apparently fell onto the brick floor kitchen at his friend's residence landing on right side of the body and right side of the face.  When he woke up he was unable to move his right leg or arm and had significant pain throughout upper and lower extremities.  He was brought to the ED.  In the ED CK was found to be greater than 50,000.  UDS was positive for cocaine.  CT head/maxillofacial/cervical spine without contrast were negative for acute intracranial abnormality.  MRI brain showed punctate acute infarcts in the right septal lobe and right cerebellum.  CT abdomen/pelvis was unremarkable.  Nephrology, neurology, orthopedic surgery was consulted. Patient underwent placement of right IJ tunneled catheter.  Hemodialysis was  initiated.  Stroke work-up was completed.  He was started on hemodialysis likely had stroke from hypoperfusion event versus vasospasm from cocaine use.  Recommended EMG nerve conduction studies in 4 to 6 weeks outpatient.  Ortho was consulted and stated no concern for compartment syndrome.   Hospital Course:   1. Rhabdomyolysis-patient presented with CK greater than 50,000, it has slowly come down with bicarb drip.  At this time CK is down to 2,456.  Nephrology following. 2. Acute kidney injury/metabolic acidosis-likely from rhabdomyolysis as above.  Also patient was hypotensive which could have caused ischemic ATN.  Patient started on hemodialysis per nephrology.  No signs of recovery yet.  Patient to continue hemodialysis MWF.  Nephrology also looking for setting up outpatient hemodialysis. 3. Transaminitis-patient has significant elevated LFTs on admission.  Likely from underlying rhabdomyolysis, hypoperfusion.  Acetaminophen level is normal.  Resolved.  AST is 76, ALT 36. 4. 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  Neurology was consulted. Weakness likely peripheral nature need outpatient EMG.  Orthopedics was consulted for possible developing compartment syndrome likely acute neurologic pulses.  Recommended outpatient EMG if no improvement.  No concern for compartment syndrome.  Right arm venous duplex was negative for DVT. 5. Crohn's disease-patient follows with [facility].  He has been on Remicade monotherapy.  Remicade is currently on hold. 6. Substance/alcohol abuse-no history of alcohol withdrawal in the past.  UDS positive for cocaine.  Continue CIWA protocol.   Procedures:  Tunneled  dialysis catheter  Consultations:  Nephrology  Orthopedics  Discharge Exam: Vitals:   11/09/19 1046 11/09/19 1131  BP: 113/64 113/68   Pulse: 88 99  Resp: 14 18  Temp: 98.2 F (36.8 C) 98 F (36.7 C)  SpO2: 97% 98%    General: Appears in no acute distress Cardiovascular: S1-S2, regular Respiratory: Clear to auscultation bilaterally  Discharge Instructions   Discharge Instructions    Diet - low sodium heart healthy   Complete by: As directed    Increase activity slowly   Complete by: As directed    No wound care   Complete by: As directed      Allergies as of 11/09/2019   No Known Allergies     Medication List    STOP taking these medications   sildenafil 50 MG tablet Commonly known as: VIAGRA     TAKE these medications   acetaminophen 500 MG tablet Commonly known as: TYLENOL Take 1 tablet (500 mg total) by mouth every 8 (eight) hours as needed for mild pain or moderate pain. What changed:   how much to take  when to take this   aspirin 81 MG EC tablet Take 1 tablet (81 mg total) by mouth daily. Swallow whole. Start taking on: November 10, 2019   calcium acetate 667 MG capsule Commonly known as: PHOSLO Take 1 capsule (667 mg total) by mouth 3 (three) times daily with meals.   calcium carbonate 500 MG chewable tablet Commonly known as: TUMS - dosed in mg elemental calcium Chew 2 tablets (400 mg of elemental calcium total) by mouth 2 (two) times daily.   inFLIXimab 100 MG injection Commonly known as: REMICADE Inject 400 mg into the vein every 8 (eight) weeks.   loratadine 10 MG tablet Commonly known as: CLARITIN Take 10 mg by mouth as needed for allergies.            Durable Medical Equipment  (From admission, onward)         Start     Ordered   10/31/19 1656  For home use only DME 3 n 1  Once        10/31/19 1655   10/31/19 1637  For home use only DME Walker rolling  Once       Question Answer Comment  Walker: With 5 Inch Wheels   Patient needs a walker to treat with the following condition Generalized weakness      10/31/19 1638         No Known Allergies   Follow-up Information    [facility] Follow up.   Specialty: [place] Why: A representative from [facility] will contact you to arrange start date and time for your therapy. Contact information: [place] [address] [phone]                The results of significant diagnostics from this hospitalization (including imaging, microbiology, ancillary and laboratory) are listed below for reference.    Significant Diagnostic Studies: CT Abdomen Pelvis Wo Contrast  Result Date: 10/27/2019 CLINICAL DATA:  Abdominal distension and pain EXAM: CT ABDOMEN AND PELVIS WITHOUT CONTRAST TECHNIQUE: Multidetector CT imaging of the abdomen and pelvis was performed following the standard protocol without IV contrast. COMPARISON:  None. FINDINGS: Lower chest: The visualized heart size within normal limits. No pericardial fluid/thickening. No hiatal hernia. Mild streaky patchy airspace opacity seen at the right lung base. Hepatobiliary: Although limited due to the lack  of intravenous contrast, normal in appearance without gross focal abnormality. No evidence of calcified gallstones or biliary ductal dilatation. Pancreas:  Unremarkable.  No surrounding inflammatory changes. Spleen: Normal in size. Although limited due to the lack of intravenous contrast, normal in appearance. Adrenals/Urinary Tract: Both adrenal glands appear normal. The kidneys and collecting system appear normal without evidence of urinary tract calculus or hydronephrosis. Bladder is unremarkable. Stomach/Bowel: The stomach, small bowel, are normal in appearance. There is a small anterior fat and tiny portion of transverse colon containing hernia seen along the anterior abdominal wall. A surgical anastomosis seen within the left distal colon. No areas of focal wall thickening or bowel dilatation are seen. There is a moderate amount of colonic stool present. Vascular/Lymphatic: There are  no enlarged abdominal or pelvic lymph nodes. Scattered mild aortic atherosclerosis is seen. Reproductive: The prostate is unremarkable. Other: No evidence of abdominal wall mass or hernia. Musculoskeletal: No acute or significant osseous findings. Heterotopic ossifications are seen around the right hip. IMPRESSION: Small portion of transverse colon and fat containing anterior umbilical hernia, however no definite evidence bowel obstruction or strangulation. Moderate amount of colonic stool. Patchy streaky airspace opacity at the right lung base which may be due to atelectasis and/or infectious etiology. Aortic Atherosclerosis (ICD10-I70.0). Electronically Signed   By: Prudencio Pair M.D.   On: 10/27/2019 20:50   DG Ankle 2 Views Right  Result Date: 10/27/2019 CLINICAL DATA:  Trauma, fall.  Right ankle pain. EXAM: RIGHT ANKLE - 2 VIEW COMPARISON:  None. FINDINGS: No acute fracture or dislocation. There is slight lateral talar tilt. No mortise widening. No ankle joint effusion. No focal soft tissue abnormality. IMPRESSION: No acute fracture or dislocation. Slight lateral talar tilt. Electronically Signed   By: Keith Rake M.D.   On: 10/27/2019 16:59   CT Head Wo Contrast  Result Date: 10/27/2019 CLINICAL DATA:  Post injury, syncopal episode and fall. EXAM: CT HEAD WITHOUT CONTRAST CT MAXILLOFACIAL WITHOUT CONTRAST CT CERVICAL SPINE WITHOUT CONTRAST TECHNIQUE: Multidetector CT imaging of the head, cervical spine, and maxillofacial structures were performed using the standard protocol without intravenous contrast. Multiplanar CT image reconstructions of the cervical spine and maxillofacial structures were also generated. COMPARISON:  None. FINDINGS: CT HEAD FINDINGS Brain: No evidence of acute infarction, hemorrhage, hydrocephalus, extra-axial collection or mass lesion/mass effect. Vascular: No hyperdense vessel or unexpected calcification. Skull: Normal. Negative for fracture or focal lesion. Other: None. CT  MAXILLOFACIAL FINDINGS Osseous: No fracture or mandibular dislocation. No destructive process. Orbits: Negative. No traumatic or inflammatory finding. Sinuses: Clear. Soft tissues: Negative. CT CERVICAL SPINE FINDINGS Alignment: Normal. Skull base and vertebrae: No acute fracture. No primary bone lesion or focal pathologic process. Soft tissues and spinal canal: No prevertebral fluid or swelling. No visible canal hematoma. Disc levels:  Mild osteoarthritic changes at C2-C3, C3-C4 and C4-C5. Upper chest: Negative. Other: None. IMPRESSION: 1. No acute intracranial abnormality. 2. No evidence of acute traumatic injury to the cervical spine. 3. No evidence of facial fractures. 4. Mild osteoarthritic changes of the cervical spine. Electronically Signed   By: Fidela Salisbury M.D.   On: 10/27/2019 16:51   CT Cervical Spine Wo Contrast  Result Date: 10/27/2019 CLINICAL DATA:  Post injury, syncopal episode and fall. EXAM: CT HEAD WITHOUT CONTRAST CT MAXILLOFACIAL WITHOUT CONTRAST CT CERVICAL SPINE WITHOUT CONTRAST TECHNIQUE: Multidetector CT imaging of the head, cervical spine, and maxillofacial structures were performed using the standard protocol without intravenous contrast. Multiplanar CT image reconstructions of the cervical spine and  maxillofacial structures were also generated. COMPARISON:  None. FINDINGS: CT HEAD FINDINGS Brain: No evidence of acute infarction, hemorrhage, hydrocephalus, extra-axial collection or mass lesion/mass effect. Vascular: No hyperdense vessel or unexpected calcification. Skull: Normal. Negative for fracture or focal lesion. Other: None. CT MAXILLOFACIAL FINDINGS Osseous: No fracture or mandibular dislocation. No destructive process. Orbits: Negative. No traumatic or inflammatory finding. Sinuses: Clear. Soft tissues: Negative. CT CERVICAL SPINE FINDINGS Alignment: Normal. Skull base and vertebrae: No acute fracture. No primary bone lesion or focal pathologic process. Soft tissues and  spinal canal: No prevertebral fluid or swelling. No visible canal hematoma. Disc levels:  Mild osteoarthritic changes at C2-C3, C3-C4 and C4-C5. Upper chest: Negative. Other: None. IMPRESSION: 1. No acute intracranial abnormality. 2. No evidence of acute traumatic injury to the cervical spine. 3. No evidence of facial fractures. 4. Mild osteoarthritic changes of the cervical spine. Electronically Signed   By: Fidela Salisbury M.D.   On: 10/27/2019 16:51   MR ANGIO HEAD WO CONTRAST  Result Date: 10/29/2019 CLINICAL DATA:  Left-sided weakness EXAM: MRA HEAD WITHOUT CONTRAST MRA NECK WITHOUT CONTRAST TECHNIQUE: Angiographic images of the Circle of Willis were obtained using MRA technique without intravenous contrast. Angiographic images of the neck were obtained using MRA technique without intravenous contrast. Carotid stenosis measurements (when applicable) are obtained utilizing NASCET criteria, using the distal internal carotid diameter as the denominator. COMPARISON:  None. FINDINGS: MRA HEAD FINDINGS POSTERIOR CIRCULATION: --Vertebral arteries: Normal V4 segments. --Inferior cerebellar arteries: Normal. --Basilar artery: Normal. --Superior cerebellar arteries: Normal. --Posterior cerebral arteries: Normal. The left PCA is predominantly supplied by the posterior communicating artery. ANTERIOR CIRCULATION: --Intracranial internal carotid arteries: Normal. --Anterior cerebral arteries (ACA): Normal. Both A1 segments are present. Patent anterior communicating artery (a-comm). --Middle cerebral arteries (MCA): Normal. MRA NECK FINDINGS Normal vertebral and carotid arteries. IMPRESSION: Normal MRA of the head and neck. Electronically Signed   By: Ulyses Jarred M.D.   On: 10/29/2019 00:25   MR ANGIO NECK WO CONTRAST  Result Date: 10/29/2019 CLINICAL DATA:  Left-sided weakness EXAM: MRA HEAD WITHOUT CONTRAST MRA NECK WITHOUT CONTRAST TECHNIQUE: Angiographic images of the Circle of Willis were obtained using MRA  technique without intravenous contrast. Angiographic images of the neck were obtained using MRA technique without intravenous contrast. Carotid stenosis measurements (when applicable) are obtained utilizing NASCET criteria, using the distal internal carotid diameter as the denominator. COMPARISON:  None. FINDINGS: MRA HEAD FINDINGS POSTERIOR CIRCULATION: --Vertebral arteries: Normal V4 segments. --Inferior cerebellar arteries: Normal. --Basilar artery: Normal. --Superior cerebellar arteries: Normal. --Posterior cerebral arteries: Normal. The left PCA is predominantly supplied by the posterior communicating artery. ANTERIOR CIRCULATION: --Intracranial internal carotid arteries: Normal. --Anterior cerebral arteries (ACA): Normal. Both A1 segments are present. Patent anterior communicating artery (a-comm). --Middle cerebral arteries (MCA): Normal. MRA NECK FINDINGS Normal vertebral and carotid arteries. IMPRESSION: Normal MRA of the head and neck. Electronically Signed   By: Ulyses Jarred M.D.   On: 10/29/2019 00:25   MR Brain Wo Contrast (neuro protocol)  Result Date: 10/27/2019 CLINICAL DATA:  Syncope.  Right-sided weakness. EXAM: MRI HEAD WITHOUT CONTRAST TECHNIQUE: Multiplanar, multiecho pulse sequences of the brain and surrounding structures were obtained without intravenous contrast. COMPARISON:  Head CT 10/27/2019 FINDINGS: Brain: There is symmetric restricted diffusion and T2 hyperintensity in the globi pallidi. There are also 2 subcentimeter foci of restricted diffusion in the right occipital lobe and a single punctate focus of restricted diffusion in the right cerebellum. The brain is normal in signal elsewhere. No intracranial hemorrhage,  mass, midline shift, or extra-axial fluid collection is identified. The ventricles and sulci are normal. Vascular: Major intracranial vascular flow voids are preserved. Skull and upper cervical spine: Unremarkable bone marrow signal. Sinuses/Orbits: Unremarkable orbits.  Paranasal sinuses and mastoid air cells are clear. Other: None. IMPRESSION: 1. Symmetric restricted diffusion in the globi pallidi. This is most often seen with an acute toxic or metabolic insult (including carbon monoxide poisoning and drugs of abuse) or hypoxic-ischemic injury. No hemorrhage. 2. Punctate acute infarcts in the right occipital lobe and right cerebellum. Electronically Signed   By: Logan Bores M.D.   On: 10/27/2019 19:01   MR CERVICAL SPINE WO CONTRAST  Result Date: 10/29/2019 CLINICAL DATA:  Right upper and lower extremity weakness EXAM: MRI CERVICAL SPINE WITHOUT CONTRAST TECHNIQUE: Multiplanar, multisequence MR imaging of the cervical spine was performed. No intravenous contrast was administered. COMPARISON:  None. FINDINGS: Alignment: Reversal of normal cervical lordosis may be positional or due to muscle spasm. Vertebrae: No fracture, evidence of discitis, or bone lesion. Cord: Normal signal and morphology. Posterior Fossa, vertebral arteries, paraspinal tissues: Visualized posterior fossa is normal. Vertebral artery flow voids are preserved. No prevertebral soft tissue swelling. Disc levels: C1-2: Small central disc protrusion. C2-3: Normal disc space and facet joints. There is no spinal canal stenosis. No neural foraminal stenosis. C3-4: Small central disc protrusion with minimal superior migration . Mild spinal canal stenosis. Mild left neural foraminal stenosis. C4-5: Small disc bulge with bilateral facet hypertrophy and left greater than right uncovertebral spurring. There is no spinal canal stenosis. Mild right, moderate left neural foraminal stenosis. C5-6: Normal disc space and facet joints. There is no spinal canal stenosis. No neural foraminal stenosis. C6-7: Normal disc space and facet joints. There is no spinal canal stenosis. No neural foraminal stenosis. C7-T1: Normal disc space and facet joints. There is no spinal canal stenosis. No neural foraminal stenosis. IMPRESSION: 1.  Mild spinal canal stenosis at C3-4 secondary to small central disc protrusion with minimal superior migration. 2. Mild right and moderate left C4-5 neural foraminal stenosis. 3. Mild left C3-4 neural foraminal stenosis. Electronically Signed   By: Ulyses Jarred M.D.   On: 10/29/2019 00:09   MR LUMBAR SPINE WO CONTRAST  Result Date: 10/29/2019 CLINICAL DATA:  Left lower extremity weakness EXAM: MRI LUMBAR SPINE WITHOUT CONTRAST TECHNIQUE: Multiplanar, multisequence MR imaging of the lumbar spine was performed. No intravenous contrast was administered. COMPARISON:  None. FINDINGS: Segmentation:  Standard. Alignment:  Physiologic. Vertebrae:  No fracture, evidence of discitis, or bone lesion. Conus medullaris and cauda equina: Conus extends to the L1 level. Conus and cauda equina appear normal. Paraspinal and other soft tissues: Negative. Disc levels: There is no disc herniation, spinal canal stenosis or neural foraminal stenosis. There is mild facet arthrosis at L4-5 and L5-S1. IMPRESSION: 1. No disc herniation, spinal canal stenosis or neural foraminal stenosis. 2. Mild facet arthrosis at L4-5 and L5-S1. Electronically Signed   By: Ulyses Jarred M.D.   On: 10/29/2019 00:16   US RENAL  Result Date: 10/28/2019 CLINICAL DATA:  Acute kidney injury EXAM: RENAL / URINARY TRACT ULTRASOUND COMPLETE COMPARISON:  CT abdomen/pelvis dated 10/27/2019 FINDINGS: Right Kidney: Renal measurements: 11.5 x 6.3 x 6.8 cm = volume: 257 mL. Echogenicity within normal limits. No mass or hydronephrosis visualized. Left Kidney: Renal measurements: 10.0 x 7.0 x 6.1 cm = volume: 223 mL. Echogenicity within normal limits. No mass or hydronephrosis visualized. Bladder: Appears normal for degree of bladder distention. Other: None. IMPRESSION: Negative  renal ultrasound.  No hydronephrosis. Electronically Signed   By: Julian Hy M.D.   On: 10/28/2019 08:40   IR Fluoro Guide CV Line Right  Result Date: 10/30/2019 INDICATION:  49 year old male with a history of rhabdomyolysis, referred for tunneled hemodialysis catheter EXAM: IMAGE GUIDED PLACEMENT OF TUNNELED HEMODIALYSIS CATHETER MEDICATIONS: 2 g Ancef; The antibiotic was administered within an appropriate time interval prior to skin puncture. ANESTHESIA/SEDATION: Moderate (conscious) sedation was employed during this procedure. A total of Versed 2.0 mg and Fentanyl 100 mcg was administered intravenously. Moderate Sedation Time: 18 minutes. The patient's level of consciousness and vital signs were monitored continuously by radiology nursing throughout the procedure under my direct supervision. FLUOROSCOPY TIME:  Fluoroscopy Time: 0 minutes 6 seconds (1 mGy). COMPLICATIONS: None PROCEDURE: Informed written consent was obtained from the patient after a discussion of the risks, benefits, and alternatives to treatment. Questions regarding the procedure were encouraged and answered. The right neck and chest were prepped with chlorhexidine in a sterile fashion, and a sterile drape was applied covering the operative field. Maximum barrier sterile technique with sterile gowns and gloves were used for the procedure. A timeout was performed prior to the initiation of the procedure. Ultrasound survey was performed. Micropuncture kit was utilized to access the right internal jugular vein under direct, real-time ultrasound guidance after the overlying soft tissues were anesthetized with 1% lidocaine with epinephrine. Stab incision was made with 11 blade scalpel. Microwire was passed centrally. The microwire was then marked to measure appropriate internal catheter length. External tunneled length was estimated. A total tip to cuff length of 19 cm was selected. 035 guidewire was advanced to the level of the IVC. Skin and subcutaneous tissues of chest wall below the clavicle were generously infiltrated with 1% lidocaine for local anesthesia. A small stab incision was made with 11 blade scalpel. The  selected hemodialysis catheter was tunneled in a retrograde fashion from the anterior chest wall to the venotomy incision. Serial dilation was performed and then a peel-away sheath was placed. The catheter was then placed through the peel-away sheath with tips ultimately positioned within the superior aspect of the right atrium. Final catheter positioning was confirmed and documented with a spot radiographic image. The catheter aspirates and flushes normally. The catheter was flushed with appropriate volume heparin dwells. The catheter exit site was secured with a 0-Prolene retention suture. Gel-Foam slurry was infused into the soft tissue tract. The venotomy incision was closed Derma bond and sterile dressing. Dressings were applied at the chest wall. Patient tolerated the procedure well and remained hemodynamically stable throughout. No complications were encountered and no significant blood loss encountered. IMPRESSION: Status post right IJ tunneled hemodialysis catheter. Signed, Dulcy Fanny. Dellia Nims, RPVI [facility] Radiology Electronically Signed   By: Corrie Mckusick D.O.   On: 10/30/2019 18:21   IR US Guide Vasc Access Right  Result Date: 10/30/2019 INDICATION: 49 year old male with a history of rhabdomyolysis, referred for tunneled hemodialysis catheter EXAM: IMAGE GUIDED PLACEMENT OF TUNNELED HEMODIALYSIS CATHETER MEDICATIONS: 2 g Ancef; The antibiotic was administered within an appropriate time interval prior to skin puncture. ANESTHESIA/SEDATION: Moderate (conscious) sedation was employed during this procedure. A total of Versed 2.0 mg and Fentanyl 100 mcg was administered intravenously. Moderate Sedation Time: 18 minutes. The patient's level of consciousness and vital signs were monitored continuously by radiology nursing throughout the procedure under my direct supervision. FLUOROSCOPY TIME:  Fluoroscopy Time: 0 minutes 6 seconds (1 mGy). COMPLICATIONS:  None  PROCEDURE: Informed written consent was obtained from the patient after a discussion of the risks, benefits, and alternatives to treatment. Questions regarding the procedure were encouraged and answered. The right neck and chest were prepped with chlorhexidine in a sterile fashion, and a sterile drape was applied covering the operative field. Maximum barrier sterile technique with sterile gowns and gloves were used for the procedure. A timeout was performed prior to the initiation of the procedure. Ultrasound survey was performed. Micropuncture kit was utilized to access the right internal jugular vein under direct, real-time ultrasound guidance after the overlying soft tissues were anesthetized with 1% lidocaine with epinephrine. Stab incision was made with 11 blade scalpel. Microwire was passed centrally. The microwire was then marked to measure appropriate internal catheter length. External tunneled length was estimated. A total tip to cuff length of 19 cm was selected. 035 guidewire was advanced to the level of the IVC. Skin and subcutaneous tissues of chest wall below the clavicle were generously infiltrated with 1% lidocaine for local anesthesia. A small stab incision was made with 11 blade scalpel. The selected hemodialysis catheter was tunneled in a retrograde fashion from the anterior chest wall to the venotomy incision. Serial dilation was performed and then a peel-away sheath was placed. The catheter was then placed through the peel-away sheath with tips ultimately positioned within the superior aspect of the right atrium. Final catheter positioning was confirmed and documented with a spot radiographic image. The catheter aspirates and flushes normally. The catheter was flushed with appropriate volume heparin dwells. The catheter exit site was secured with a 0-Prolene retention suture. Gel-Foam slurry was infused into the soft tissue tract. The venotomy incision was closed Derma bond and sterile  dressing. Dressings were applied at the chest wall. Patient tolerated the procedure well and remained hemodynamically stable throughout. No complications were encountered and no significant blood loss encountered. IMPRESSION: Status post right IJ tunneled hemodialysis catheter. Signed, Dulcy Fanny. Dellia Nims, RPVI [facility] Electronically Signed   By: Corrie Mckusick D.O.   On: 10/30/2019 18:21   DG Chest Port 1 View  Result Date: 10/27/2019 CLINICAL DATA:  Trauma.  Fall onto right side. EXAM: PORTABLE CHEST 1 VIEW COMPARISON:  Chest radiograph 03/06/2010, no interval imaging available. FINDINGS: Lung volumes are low. Mild elevation of right hemidiaphragm. Upper normal heart size likely accentuated by technique. No evidence of pneumothorax. Streaky opacities at the right greater than left lung base typical of atelectasis. No acute osseous abnormalities are seen. Right anterior fourth rib fracture appears remote. IMPRESSION: Low lung volumes with bibasilar atelectasis. No evidence of acute traumatic injury. Electronically Signed   By: Keith Rake M.D.   On: 10/27/2019 16:55   ECHOCARDIOGRAM COMPLETE  Result Date: 10/29/2019    ECHOCARDIOGRAM REPORT   Patient Name:   [name] Date of Exam: 10/29/2019 Medical Rec #:  [id number]      Height:       71.0 in Accession #:    [id number]     Weight:       230.2 lb Date of Birth:  [birth year]      BSA:          2.238 m Patient Age:    54 years       BP:           121/67 mmHg Patient Gender: M              HR:  111 bpm. Exam Location:  Inpatient Procedure: 2D Echo Indications:    Stroke I163.9  History:        Patient has no prior history of Echocardiogram examinations.  Sonographer:    Mikki Santee RDCS (AE) Referring Phys: [phone] MCNEILL P [place]  1. Left ventricular ejection fraction, by estimation, is 55%. The left ventricle has normal function. The left ventricle has no  regional wall motion abnormalities. Left ventricular diastolic parameters were normal.  2. Right ventricular systolic function is normal. The right ventricular size is normal.  3. The mitral valve is normal in structure. No evidence of mitral valve regurgitation. No evidence of mitral stenosis.  4. The aortic valve is normal in structure. Aortic valve regurgitation is not visualized. No aortic stenosis is present.  5. The inferior vena cava is normal in size with greater than 50% respiratory variability, suggesting right atrial pressure of 3 mmHg. FINDINGS  Left Ventricle: Left ventricular ejection fraction, by estimation, is 55%. The left ventricle has normal function. The left ventricle has no regional wall motion abnormalities. The left ventricular internal cavity size was normal in size. There is no left ventricular hypertrophy. Left ventricular diastolic parameters were normal. Right Ventricle: The right ventricular size is normal. No increase in right ventricular wall thickness. Right ventricular systolic function is normal. Left Atrium: Left atrial size was normal in size. Right Atrium: Right atrial size was normal in size. Pericardium: There is no evidence of pericardial effusion. Mitral Valve: The mitral valve is normal in structure. Normal mobility of the mitral valve leaflets. No evidence of mitral valve regurgitation. No evidence of mitral valve stenosis. Tricuspid Valve: The tricuspid valve is normal in structure. Tricuspid valve regurgitation is not demonstrated. No evidence of tricuspid stenosis. Aortic Valve: The aortic valve is normal in structure. Aortic valve regurgitation is not visualized. No aortic stenosis is present. Pulmonic Valve: The pulmonic valve was normal in structure. Pulmonic valve regurgitation is not visualized. No evidence of pulmonic stenosis. Aorta: The aortic root is normal in size and structure. Venous: The inferior vena cava is normal in size with greater than 50% respiratory  variability, suggesting right atrial pressure of 3 mmHg. IAS/Shunts: No atrial level shunt detected by color flow Doppler.  LEFT VENTRICLE PLAX 2D LVIDd:         5.70 cm  Diastology LVIDs:         3.80 cm  LV e' medial:   8.38 cm/s LV PW:         1.00 cm  LV E/e' medial: 10.5 LV IVS:        1.00 cm LVOT diam:     2.30 cm LV SV:         41 LV SV Index:   18 LVOT Area:     4.15 cm  RIGHT VENTRICLE RV S prime:     17.00 cm/s LEFT ATRIUM           Index       RIGHT ATRIUM           Index LA diam:      3.20 cm 1.43 cm/m  RA Area:     10.50 cm LA Vol (A2C): 23.4 ml 10.45 ml/m RA Volume:   19.40 ml  8.67 ml/m LA Vol (A4C): 29.7 ml 13.27 ml/m  AORTIC VALVE LVOT Vmax:   78.30 cm/s LVOT Vmean:  46.600 cm/s LVOT VTI:    0.098 m  AORTA Ao Root diam: 3.50 cm MITRAL VALVE MV  Area (PHT): 5.23 cm    SHUNTS MV Decel Time: 145 msec    Systemic VTI:  0.10 m MV E velocity: 87.90 cm/s  Systemic Diam: 2.30 cm MV A velocity: 64.90 cm/s MV E/A ratio:  1.35 Jenkins Rouge MD Electronically signed by Jenkins Rouge MD Signature Date/Time: 10/29/2019/2:57:37 PM    Final    DG HIP UNILAT WITH PELVIS 2-3 VIEWS RIGHT  Result Date: 10/27/2019 CLINICAL DATA:  Fall onto right side.  History of right hip surgery. EXAM: DG HIP (WITH OR WITHOUT PELVIS) 2-3V RIGHT COMPARISON:  Remote radiograph 06/28/2009 FINDINGS: No evidence of acute fracture. There is heterotopic ossification about the right hip. This appears increased from prior exam. Femoral head is seated in the acetabulum. Pubic rami are intact. Pubic symphysis and sacroiliac joints are congruent. There is slight flattening of the left femoral head neck junction that is unchanged from prior. IMPRESSION: No acute fracture or subluxation of the pelvis or right hip. Electronically Signed   By: Keith Rake M.D.   On: 10/27/2019 16:57   VAS [place] UPPER EXTREMITY VENOUS DUPLEX  Result Date: 11/02/2019 UPPER VENOUS STUDY  Indications: Swelling, and Edema Risk Factors: Surgery Right CV Line  10/30/19. Performing Technologist: Griffin Basil RCT RDMS  Examination Guidelines: A complete evaluation includes B-mode imaging, spectral Doppler, color Doppler, and power Doppler as needed of all accessible portions of each vessel. Bilateral testing is considered an integral part of a complete examination. Limited examinations for reoccurring indications may be performed as noted.  Right Findings: +----------+------------+---------+-----------+----------+-------+ RIGHT     CompressiblePhasicitySpontaneousPropertiesSummary +----------+------------+---------+-----------+----------+-------+ IJV           Full       Yes       Yes                      +----------+------------+---------+-----------+----------+-------+ Subclavian    Full       Yes       Yes                      +----------+------------+---------+-----------+----------+-------+ Axillary      Full       Yes       Yes                      +----------+------------+---------+-----------+----------+-------+ Brachial      Full       Yes       Yes                      +----------+------------+---------+-----------+----------+-------+ Radial        Full                                          +----------+------------+---------+-----------+----------+-------+ Ulnar         Full                                          +----------+------------+---------+-----------+----------+-------+ Cephalic      Full                                          +----------+------------+---------+-----------+----------+-------+ Basilic  Full                                          +----------+------------+---------+-----------+----------+-------+  Left Findings: +----------+------------+---------+-----------+----------+-------+ LEFT      CompressiblePhasicitySpontaneousPropertiesSummary +----------+------------+---------+-----------+----------+-------+ IJV           Full       Yes       Yes                       +----------+------------+---------+-----------+----------+-------+ Subclavian    Full       Yes       Yes                      +----------+------------+---------+-----------+----------+-------+  Summary:  Right: No evidence of deep vein thrombosis in the upper extremity. No evidence of superficial vein thrombosis in the upper extremity. No evidence of thrombosis in the subclavian.  Left: No evidence of thrombosis in the subclavian.  *See table(s) above for measurements and observations.  Diagnosing physician: Monica Martinez MD Electronically signed by Monica Martinez MD on 11/02/2019 at 4:36:14 PM.    Final    CT Maxillofacial WO CM  Result Date: 10/27/2019 CLINICAL DATA:  Post injury, syncopal episode and fall. EXAM: CT HEAD WITHOUT CONTRAST CT MAXILLOFACIAL WITHOUT CONTRAST CT CERVICAL SPINE WITHOUT CONTRAST TECHNIQUE: Multidetector CT imaging of the head, cervical spine, and maxillofacial structures were performed using the standard protocol without intravenous contrast. Multiplanar CT image reconstructions of the cervical spine and maxillofacial structures were also generated. COMPARISON:  None. FINDINGS: CT HEAD FINDINGS Brain: No evidence of acute infarction, hemorrhage, hydrocephalus, extra-axial collection or mass lesion/mass effect. Vascular: No hyperdense vessel or unexpected calcification. Skull: Normal. Negative for fracture or focal lesion. Other: None. CT MAXILLOFACIAL FINDINGS Osseous: No fracture or mandibular dislocation. No destructive process. Orbits: Negative. No traumatic or inflammatory finding. Sinuses: Clear. Soft tissues: Negative. CT CERVICAL SPINE FINDINGS Alignment: Normal. Skull base and vertebrae: No acute fracture. No primary bone lesion or focal pathologic process. Soft tissues and spinal canal: No prevertebral fluid or swelling. No visible canal hematoma. Disc levels:  Mild osteoarthritic changes at C2-C3, C3-C4 and C4-C5. Upper chest: Negative. Other: None.  IMPRESSION: 1. No acute intracranial abnormality. 2. No evidence of acute traumatic injury to the cervical spine. 3. No evidence of facial fractures. 4. Mild osteoarthritic changes of the cervical spine. Electronically Signed   By: Fidela Salisbury M.D.   On: 10/27/2019 16:51    Microbiology: No results found for this or any previous visit (from the past 240 hour(s)).   Labs: Basic Metabolic Panel: Recent Labs  Lab 11/03/19 0434 11/03/19 0434 11/04/19 0504 11/04/19 0504 11/05/19 0310 11/06/19 0532 11/07/19 0429 11/08/19 0608 11/09/19 0436  NA 130*   < > 131*   < > 131* 133* 131* 133* 132*  K 4.2   < > 3.9   < > 4.6 4.1 4.1 4.0 4.2  CL 96*   < > 95*   < > 96* 98 98 99 98  CO2 17*   < > 20*   < > 20* 25 20* 25 24  GLUCOSE 121*   < > 119*   < > 112* 116* 104* 107* 106*  BUN 103*   < > 80*   < > 106* 63* 85* 54* 74*  CREATININE 11.81*   < > 9.90*   < >  11.95* 8.73* 10.88* 8.48* 10.43*  CALCIUM 6.2*   < > 6.5*   < > 6.7* 7.3* 7.5* 7.9* 8.0*  MG 2.2  --  2.2  --  2.3 2.1 2.1  --   --   PHOS 9.6*  --  7.8*  --  8.7* 6.6* 7.8*  --   --    < > = values in this interval not displayed.   Liver Function Tests: Recent Labs  Lab 11/04/19 0504 11/05/19 0310 11/06/19 0532 11/07/19 0429 11/08/19 0608  AST 162* 142* 93* 76* 57*  ALT 85* 59* 42 36 32  ALKPHOS 47 42 40 38 42  BILITOT 0.6 1.2 0.5 0.9 0.6  PROT 4.5* 4.5* 4.3* 4.5* 4.3*  ALBUMIN 2.2* 2.1* 2.0* 2.0* 2.0*   No results for input(s): LIPASE, AMYLASE in the last 168 hours. No results for input(s): AMMONIA in the last 168 hours. CBC: Recent Labs  Lab 11/05/19 0310 11/06/19 0532 11/07/19 0429 11/08/19 0608 11/09/19 0436  WBC 21.7* 16.7* 16.5* 13.8* 12.6*  NEUTROABS 13.1* 11.2* 12.0* 10.1* 9.5*  HGB 10.2* 9.6* 9.5* 9.0* 9.1*  HCT 31.4* 30.0* 29.1* 28.0* 27.9*  MCV 91.3 92.9 93.6 93.0 93.9  PLT 183 224 271 279 318   Cardiac Enzymes: Recent Labs  Lab 11/05/19 0310 11/06/19 0532 11/07/19 0429 11/08/19 0608  11/09/19 0436  CKTOTAL 12,196* 6,180* 4,799* 2,968* 2,456*   BNP:  CBG: Recent Labs  Lab 11/08/19 1719  GLUCAP 108*       Signed:  Oswald Hillock MD.  [facility] 11/09/2019, 11:56 AM

## 2019-11-09 NOTE — Progress Notes (Signed)
OT Cancellation Note  Patient Details Name: TAO SATZ MRN: [id number] DOB: 12/02/1970   Cancelled Treatment:    Reason Eval/Treat Not Completed: Patient at procedure or test/ unavailable (HD. Will return as schedule allows.)  [place], OTR/L Acute Rehab Pager: [phone] Office: [phone] 11/09/2019, 11:02 AM

## 2019-11-09 NOTE — H&P (Addendum)
Physical Medicine and Rehabilitation Admission H&P     CC: Functional deficits due to stroke and rhabdomyolysis.     HPI: [name] is a 49 year old male with history of Crohn's, PE, depression, recent uvula surgery; who was admitted on 10/27/2019 with RUE/RLE weakness. Per reports, the night before patient was intoxicated on alcohol, took a [name] and blacked out on friends break flow and rate on his right side till the next morning. UDS positive for cocaine and EtOH level-24. He woke up with severe pain on right side with swelling of his arm and leg, was hypotensive at admission, had acute renal failure with rhabdomyolysis. He was treated with IV fluids as well as Lokelma. MRI brain showed acute punctate infarcts in right occipital lobe and right cerebellum as well as restricted diffusion globi pallidi, MRI of cervical and lumbar spine done due to weakness on the right and showed mild right and left C8/4 foraminal stenosis with mild facet arthrosis L4-S1. MRA head/neck negative for LVO. Renal ultrasound was negative for hydronephrosis and showed normal echogenicity.    Nephrology following for input on acute renal failure and hemodialysis initiated due to poor recovery and poor urine output. Dr. Leonel Ramsay felt that weakness was peripheral in nature due to neuropraxia and rhabdomyolysis. Cerebral injury felt to be due to overdose, hypotension with prolonged downtime, question of fentanyl/cocaine combo--supportive care recommended. 2D echo showed EF of 55% with normal function. Right hip films were negative for fracture. Dr. Stann Mainland also consulted for input on edema RUE/RLE and recommended monitoring for compartment syndrome. RUE Dopplers done on 09/10 due to increasing edema and was negative for DVT or superficial thrombosis. CPK on downward trend. Hemodialysis ongoing MWF and CLIP process initiated. Normocytic anemia being managed with IV iron X 3 and ESA added today. Has been getting IV  Dilaudid as needed for pain. He continues to have limitations due to right-sided weakness with sensory deficits and higher level cognitive deficits. CIR recommended due to functional decline.   --Received flu vaccine this am.      Review of Systems  Constitutional: Negative for fever.  HENT: Negative for tinnitus.   Eyes: Negative for double vision.  Respiratory: Negative for cough.   Cardiovascular: Negative for palpitations.  Gastrointestinal: Positive for constipation and nausea.  Genitourinary: Negative.   Musculoskeletal: Positive for joint pain and myalgias.  Skin: Negative for itching.  Neurological: Positive for tingling, sensory change, focal weakness and weakness.  Psychiatric/Behavioral: Negative for suicidal ideas.            Past Medical History:  Diagnosis Date  . Allergy    . Crohn's colitis ([place] [facility])    . Depression    . Pulmonary embolism [facility])             Past Surgical History:  Procedure Laterality Date  . COLOSTOMY   March 2011  . Colostomy reversal   January 2012  . HIP SURGERY   R hip surgery   . IR FLUORO GUIDE CV LINE RIGHT   10/30/2019  . IR US GUIDE VASC ACCESS RIGHT   10/30/2019           Family History  Problem Relation Age of Onset  . Colon polyps Father    . Multiple sclerosis Sister    . Colon cancer Neg Hx    . Esophageal cancer Neg Hx    . Stomach cancer Neg Hx    . Pancreatic cancer Neg Hx    .  Liver disease Neg Hx        Social History: Married. Independent and working. Reports that he has been smoking. He has been smoking about 0.50 packs per day. He has never used smokeless tobacco. He reports current alcohol use--3-4 beers/liqou most days of the week. He reports that he uses drugs on rare occasions.       Allergies: No Known Allergies      Medications Prior to Admission  Medication Sig Dispense Refill  . acetaminophen (TYLENOL) 500 MG tablet Take 1,500 mg by mouth 2 (two) times daily as needed for mild pain or moderate pain.        Marland Kitchen inFLIXimab (REMICADE) 100 MG injection Inject 400 mg into the vein every 8 (eight) weeks.      Marland Kitchen loratadine (CLARITIN) 10 MG tablet Take 10 mg by mouth as needed for allergies.      . sildenafil (VIAGRA) 50 MG tablet Take 50 mg by mouth daily as needed for erectile dysfunction.          Drug Regimen Review  Drug regimen was reviewed and remains appropriate with no significant issues identified   Home: Home Living Family/patient expects to be discharged to:: Private residence Living Arrangements: Spouse/significant other, Children Available Help at Discharge: Family Type of Home: House Home Access: Stairs to enter Technical brewer of Steps: 2-3 Home Layout: One level Bathroom Shower/Tub: Tub/shower unit, Multimedia programmer: Programmer, systems: Yes Home Equipment: None   Functional History: Prior Function Level of Independence: Independent   Functional Status:  Mobility: Bed Mobility Overal bed mobility: Needs Assistance Bed Mobility: Supine to Sit Supine to sit: HOB elevated, Min guard Sit to supine: Min assist General bed mobility comments: minA for management of R LE off bed, vc use of L LE to offweight hips to aid in movement of R LE, slight dizziness with coming to seated but quickly dissipates  Transfers Overall transfer level: Needs assistance Equipment used: Rolling walker (2 wheeled) Transfers: Sit to/from Stand Sit to Stand: Min guard, Min assist Stand pivot transfers: Supervision, Min guard General transfer comment: min A for steadying with initial sit>stand, vc for hand placement, pt with min A for lowering to toilet, poor judgement of distance of toilet behind him with short sit despite vc for backing up further, min guard for subsequent sit>stand after rest break and into recliner, maximal vc for positioning Ambulation/Gait Ambulation/Gait assistance: Min assist Gait Distance (Feet): 35 Feet Assistive device: Rolling walker  (2 wheeled) Gait Pattern/deviations: Step-through pattern, Decreased step length - right, Decreased step length - left General Gait Details: min physical A for steadying, however maximal multimodal cues for awareness of R sided positioning, and with increasing R sided weakness with distance, after bout of R sided buckling asked pt if he wanted to go further and he replied yes, buckling increased with next few steps. PT pointed out that he probably should have said no due to his weakness. Pt in agreement. With return to recliner in room encourged multiple standing rest breaks to recover strength  Gait velocity: decr Gait velocity interpretation: <1.31 ft/sec, indicative of household ambulator   ADL: ADL Overall ADL's : Needs assistance/impaired Eating/Feeding: Set up, Sitting Grooming: Min guard, Standing, Cueing for safety Grooming Details (indicate cue type and reason): washed hands and brushed teeth standing at sink with RW Upper Body Bathing: Supervision/ safety, Set up, Minimal assistance, Sitting Lower Body Bathing: Supervison/ safety, Set up, Min guard, Moderate assistance, Sit to/from stand Lower Body  Bathing Details (indicate cue type and reason): RLE deficits and decreased safety with RW Upper Body Dressing : Supervision/safety, Set up, Moderate assistance, Sitting Lower Body Dressing: Supervision/safety, Set up, Maximal assistance Lower Body Dressing Details (indicate cue type and reason): R hip pain and dcreased AROM to RLE.  R shoulder soreness and numbness to fingertips.  Unable to reach B feet. Toilet Transfer: Min guard, Supervision/safety, Regular Toilet, Grab bars, Surveyor, mining Details (indicate cue type and reason): cues for standing completely in front of toilet before sitting Functional mobility during ADLs: Min guard, Cueing for safety, Cueing for sequencing, Rolling walker General ADL Comments: Session addressed management of RUE edema with retrograde massage and AROM  exercises.    Cognition: Cognition Overall Cognitive Status: Impaired/Different from baseline Arousal/Alertness: Awake/alert Orientation Level: Oriented X4 Attention: Sustained Sustained Attention: Impaired Sustained Attention Impairment: Verbal complex Memory: Impaired (3/5 words independently recalled, 2 of 5 with category cues) Memory Impairment: Retrieval deficit, Other (comment) (decreased working memory) Problem Solving: Impaired Problem Solving Impairment: Functional complex Safety/Judgment: Appears intact Cognition Arousal/Alertness: Awake/alert Behavior During Therapy: Flat affect Overall Cognitive Status: Impaired/Different from baseline Area of Impairment: Safety/judgement, Problem solving, Awareness Safety/Judgement: Decreased awareness of safety Awareness: Emergent Problem Solving: Slow processing, Requires verbal cues General Comments: pt continues to have decreased awareness of his deficits, and safety. Decreased attention to his R side     Blood pressure 101/62, pulse 89, temperature 98.4 F (36.9 C), temperature source Oral, resp. rate 15, height 5' 11"  (1.803 m), weight 106.2 kg, SpO2 98 %. Physical Exam Vitals and nursing note reviewed.  Constitutional:      Comments: Frail appearing  HENT:     Head:     Comments: Wound/abrasion right maxilla    Nose: Nose normal.     Mouth/Throat:     Mouth: Mucous membranes are moist.     Pharynx: No oropharyngeal exudate.  Eyes:     Conjunctiva/sclera: Conjunctivae normal.     Pupils: Pupils are equal, round, and reactive to light.  Cardiovascular:     Rate and Rhythm: Normal rate and regular rhythm.     Heart sounds: No murmur heard.  No friction rub.  Pulmonary:     Effort: Pulmonary effort is normal.     Breath sounds: Normal breath sounds.  Abdominal:     General: Abdomen is flat. Bowel sounds are normal. There is no distension.     Palpations: Abdomen is soft.     Tenderness: There is no abdominal  tenderness.  Musculoskeletal:     Comments: 2+ edema RUE and RLE  Skin:    Comments: Scattered abrasions right arm/leg  Neurological:     Mental Status: He is alert.     Comments: Alert and oriented x 3. Normal insight and awareness. Intact Memory. Normal language and speech. Cranial nerve exam unremarkable. RUE 3+ to 4-/5. RLE 1-2/5 HE, HAD,HAB, 1/5 KE and trace to absent ADF/PF. Decreased LT right arm and leg, hypersensitive to touch. DTR's absent  Psychiatric:        Mood and Affect: Mood normal.        Behavior: Behavior normal.        Lab Results Last 48 Hours        Results for orders placed or performed during the hospital encounter of 10/27/19 (from the past 48 hour(s))  CBC with Differential/Platelet     Status: Abnormal    Collection Time: 11/08/19  6:08 AM  Result Value Ref Range  WBC 13.8 (H) 4.0 - 10.5 K/uL    RBC 3.01 (L) 4.22 - 5.81 MIL/uL    Hemoglobin 9.0 (L) 13.0 - 17.0 g/dL    HCT 28.0 (L) 39 - 52 %    MCV 93.0 80.0 - 100.0 fL    MCH 29.9 26.0 - 34.0 pg    MCHC 32.1 30.0 - 36.0 g/dL    RDW 14.5 11.5 - 15.5 %    Platelets 279 150 - 400 K/uL    nRBC 0.0 0.0 - 0.2 %    Neutrophils Relative % 71 %    Neutro Abs 10.1 (H) 1.7 - 7.7 K/uL    Lymphocytes Relative 12 %    Lymphs Abs 1.6 0.7 - 4.0 K/uL    Monocytes Relative 7 %    Monocytes Absolute 0.9 0 - 1 K/uL    Eosinophils Relative 2 %    Eosinophils Absolute 0.2 0 - 0 K/uL    Basophils Relative 1 %    Basophils Absolute 0.1 0 - 0 K/uL    WBC Morphology See Note        Comment: Mild Left Shift. 1 to 5% Metas and Myelos, Occ Pro Noted.    Immature Granulocytes 7 %    Abs Immature Granulocytes 0.92 (H) 0.00 - 0.07 K/uL    Polychromasia PRESENT        Comment: Performed at [facility], [place] [address], [place], [place] [address]  CK     Status: Abnormal    Collection Time: 11/08/19  6:08 AM  Result Value Ref Range    Total CK 2,968 (H) 49.0 - 397.0 U/L      Comment: Performed at [facility], [place] [address], [place], [place] [address]  Comprehensive metabolic panel     Status: Abnormal    Collection Time: 11/08/19  6:08 AM  Result Value Ref Range    Sodium 133 (L) 135 - 145 mmol/L    Potassium 4.0 3.5 - 5.1 mmol/L    Chloride 99 98 - 111 mmol/L    CO2 25 22 - 32 mmol/L    Glucose, Bld 107 (H) 70 - 99 mg/dL      Comment: Glucose reference range applies only to samples taken after fasting for at least 8 hours.    BUN 54 (H) 6 - 20 mg/dL    Creatinine, Ser 8.48 (H) 0.61 - 1.24 mg/dL    Calcium 7.9 (L) 8.9 - 10.3 mg/dL    Total Protein 4.3 (L) 6.5 - 8.1 g/dL    Albumin 2.0 (L) 3.5 - 5.0 g/dL    AST 57 (H) 15 - 41 U/L    ALT 32 0 - 44 U/L    Alkaline Phosphatase 42 38 - 126 U/L    Total Bilirubin 0.6 0.3 - 1.2 mg/dL    GFR calc non Af Amer 7 (L) >60 mL/min    GFR calc Af Amer 8 (L) >60 mL/min    Anion gap 9 5 - 15      Comment: Performed at [facility] [address] [place], [place] [address]  Glucose, capillary     Status: Abnormal    Collection Time: 11/08/19  5:19 PM  Result Value Ref Range    Glucose-Capillary 108 (H) 70 - 99 mg/dL      Comment: Glucose reference range applies only to samples taken after fasting for at least 8 hours.  CBC with Differential/Platelet  Status: Abnormal    Collection Time: 11/09/19  4:36 AM  Result Value Ref Range    WBC 12.6 (H) 4.0 - 10.5 K/uL    RBC 2.97 (L) 4.22 - 5.81 MIL/uL    Hemoglobin 9.1 (L) 13.0 - 17.0 g/dL    HCT 27.9 (L) 39 - 52 %    MCV 93.9 80.0 - 100.0 fL    MCH 30.6 26.0 - 34.0 pg    MCHC 32.6 30.0 - 36.0 g/dL    RDW 14.1 11.5 - 15.5 %    Platelets 318 150 - 400 K/uL    nRBC 0.0 0.0 - 0.2 %    Neutrophils Relative % 75 %    Neutro Abs 9.5 (H) 1.7 - 7.7 K/uL    Lymphocytes Relative 11 %    Lymphs Abs 1.4 0.7 - 4.0 K/uL    Monocytes Relative 7 %    Monocytes Absolute 0.9 0 - 1 K/uL    Eosinophils Relative 1 %    Eosinophils Absolute 0.2 0 - 0 K/uL    Basophils Relative 1 %    Basophils Absolute 0.1  0 - 0 K/uL    Immature Granulocytes 5 %    Abs Immature Granulocytes 0.64 (H) 0.00 - 0.07 K/uL      Comment: Performed at [facility], [address] [place], [place] [address]  Basic metabolic panel     Status: Abnormal    Collection Time: 11/09/19  4:36 AM  Result Value Ref Range    Sodium 132 (L) 135 - 145 mmol/L    Potassium 4.2 3.5 - 5.1 mmol/L    Chloride 98 98 - 111 mmol/L    CO2 24 22 - 32 mmol/L    Glucose, Bld 106 (H) 70 - 99 mg/dL      Comment: Glucose reference range applies only to samples taken after fasting for at least 8 hours.    BUN 74 (H) 6 - 20 mg/dL    Creatinine, Ser 10.43 (H) 0.61 - 1.24 mg/dL    Calcium 8.0 (L) 8.9 - 10.3 mg/dL    GFR calc non Af Amer 5 (L) >60 mL/min    GFR calc Af Amer 6 (L) >60 mL/min    Anion gap 10 5 - 15      Comment: Performed at [facility] [address], [place], [place] [address]  CK     Status: Abnormal    Collection Time: 11/09/19  4:36 AM  Result Value Ref Range    Total CK 2,456 (H) 49.0 - 397.0 U/L      Comment: Performed at [facility] [address], [place], [place] [address]      Imaging Results (Last 48 hours)  No results found.           Medical Problem List and Plan: 1.  Right hemiparesis and generalized weakness secondary to rhabdomyolysis with neuropraxic peripheral nerve injury. Pt also with punctate occipital lobe and cerebellar infarcts as well as globi pallidi insults----potentially d/t anoxic injury             -patient may  shower             -ELOS/Goals: 7-10 days 2.  Antithrombotics: -DVT/anticoagulation:  Pharmaceutical: Heparin             -antiplatelet therapy: 72m daily 3. Pain Management: D/c dilaudid--has been using  2.5 mg/day--will transition to oxycodone prn.              -  will add gabapentin for neuropathic pain RLE 4. Mood: LCSW to follow for evaluation and support.              -antipsychotic agents: N/A 5. Neuropsych: This patient is capable of making decisions  on his own behalf. 6. Skin/Wound Care: Routine pressure relief measures, local wound care asneeded.  7. Fluids/Electrolytes/Nutrition: Strict I/O. Check lytes in am.  8. AKI with rhabdomyolysis: CK- 2456 slowly coming down. HD dependent MWF- CLIP pending.  9. Crohn's disease: Remicade on hold.  10.  Abnormal LFTs: Resolving with AST 9137--> 57. Recheck LFTs in am.  11. Polysubstance abuse: Has completed CIWA protocol.  12. RUE/RLE weakness with edema and sensory loss,neuropraxia:              -PROM/AROM, edema control             -outpt EMG/NCS potentially 13. Leucocytosis: Continues to fluctuate--will monitor for signs of infection.  14. Anemia: Supplement with IV iron X 3 and Aranesp ordered.  15. Hyocalcemia: Treated with multiple doses IV calcium gluconate--now on CaCO3 bid.            Bary Leriche, PA-C 11/09/2019  I have personally performed a face to face diagnostic evaluation of this patient and formulated the key components of the plan.  Additionally, I have personally reviewed laboratory data, imaging studies, as well as relevant notes and concur with the physician assistant's documentation above.  The patient's status has not changed from the original H&P.  Any changes in documentation from the acute care chart have been noted above.  Meredith Staggers, MD, [facility]

## 2019-11-09 NOTE — Progress Notes (Signed)
Courtney Heys, MD  Physician  Physical Medicine and Rehabilitation  Consult Note     Signed  Date of Service:  11/01/2019  9:19 AM      Related encounter: ED to Hosp-Admission (Discharged) from 10/27/2019 in [facility]      Signed      Expand All Collapse All  Show:Clear all [x] Manual[x] Template[] Copied  Added by: [x] Love, Ivan Anchors, PA-C[x] Courtney Heys, MD  [] Hover for details          Physical Medicine and Rehabilitation Consult     Reason for Consult:  Functional deficits Referring Physician: Dr. Dwyane Dee     HPI: [name] is a 49 y.o. male with history of Crohn's, PE, depression who was admitted on 10/27/19 with RUE/RLE weakness. Per reports, the night before the patient was intoxicated on alcohol, took a [name] and blacked out on a friend's brick floor and laid on this right side till the next morning. UDS positive for cocaine/ETOH level 24.  He work up with severe pain right sided with swollen leg/arm, was hypotensive, had acute renal failure with rhabdomyolysis and treated with fluid boluses and Lokelma. MRI brain showed acute punctate infarcts in right occipital lobe and right cerebellum as well as restricted diffusion in globi pallidi. MRI cspine/lumbar spine done due to weakness and showed mild right and left C4/5 foraminal stenosis and mild facet arthrosis L4-S1. Nephrology following for assistance with renal failure and HD started due to poor recovery.    Dr. Leonel Ramsay felt that weakness was likely a combination--peripheral in nature due to neurapraxia and rhabdomyolysis. Cerebral injury felt to be due to overdose and prolonged down time--question fentanyl/cocaine combo and supportive care recommended. Dr. Stann Mainland following for input on RUE/RLE edema and recommended monitoring for compartment syndrome. He has had increase in RUE edema and duplex today pending.      Pt reports sleeping on and off- doesn't feel like sleeping well- so  sleeping/napping during day. LBM today- still voiding some- not normal amount.  Had HD tomorrow.      Review of Systems  Constitutional: Negative for chills and fever.  HENT: Negative for hearing loss.   Eyes: Negative for blurred vision and double vision.  Respiratory: Positive for shortness of breath.   Cardiovascular: Negative for chest pain and palpitations.  Gastrointestinal: Negative for abdominal pain, nausea and vomiting.  Genitourinary:       Foley in place  Musculoskeletal: Positive for back pain, joint pain and myalgias.  Skin: Negative for rash.  Neurological: Positive for sensory change (Numbness/tingling RUE/RLE) and focal weakness. Negative for dizziness and headaches.  Psychiatric/Behavioral: The patient is not nervous/anxious.   All other systems reviewed and are negative.         Past Medical History:  Diagnosis Date  . Allergy    . Crohn's colitis ([place])    . Depression    . Pulmonary embolism [facility])             Past Surgical History:  Procedure Laterality Date  . COLOSTOMY   March 2011  . Colostomy reversal   January 2012  . HIP SURGERY   R hip surgery   . IR FLUORO GUIDE CV LINE RIGHT   10/30/2019  . IR US GUIDE VASC ACCESS RIGHT   10/30/2019           Family History  Problem Relation Age of Onset  . Colon polyps Father    . Multiple sclerosis Sister    . Colon  cancer Neg Hx    . Esophageal cancer Neg Hx    . Stomach cancer Neg Hx    . Pancreatic cancer Neg Hx    . Liver disease Neg Hx        Social History:  Married. Independent and working PTA.  He reports that he has been smoking. He has been smoking about 0.50 packs per day. He has never used smokeless tobacco. He reports current alcohol use--3-4 beers/liquor most days of week. He reports that he uses drugs on rare occasions.       Allergies: No Known Allergies            Medications Prior to Admission  Medication Sig Dispense Refill  . acetaminophen (TYLENOL) 500 MG tablet Take 1,500 mg  by mouth 2 (two) times daily as needed for mild pain or moderate pain.       Marland Kitchen inFLIXimab (REMICADE) 100 MG injection Inject 400 mg into the vein every 8 (eight) weeks.      Marland Kitchen loratadine (CLARITIN) 10 MG tablet Take 10 mg by mouth as needed for allergies.      . sildenafil (VIAGRA) 50 MG tablet Take 50 mg by mouth daily as needed for erectile dysfunction.          Home: Home Living Family/patient expects to be discharged to:: Private residence Living Arrangements: Spouse/significant other, Children Available Help at Discharge: Family Type of Home: House Home Access: Stairs to enter Technical brewer of Steps: 2-3 Home Layout: One level Bathroom Shower/Tub: Tub/shower unit, Multimedia programmer: Programmer, systems: Yes Home Equipment: None  Functional History: Prior Function Level of Independence: Independent Functional Status:  Mobility: Bed Mobility Overal bed mobility: Needs Assistance Bed Mobility: Supine to Sit, Sit to Supine Supine to sit: Mod assist Sit to supine: Min assist General bed mobility comments: Pt required increased assistance to move R leg to edge of bed to rise into sitting.  Pt required decreased assistance to return back to bed with increased time and effort. Transfers Overall transfer level: Needs assistance Equipment used: Rolling walker (2 wheeled) Transfers: Sit to/from Stand Sit to Stand: Min assist Stand pivot transfers: Supervision, Min guard General transfer comment: Cues for hand placement and forward weight shifting to rise into standing. Ambulation/Gait Ambulation/Gait assistance: Min assist Gait Distance (Feet): 80 Feet Assistive device: Rolling walker (2 wheeled) Gait Pattern/deviations: Step-to pattern, Shuffle, Decreased stride length, Trunk flexed, Antalgic, Decreased dorsiflexion - right, Decreased stance time - right General Gait Details: Slow buckle on R with cues knee extension in stance phase lacks  dorsiflexion. Gait velocity: decreased Gait velocity interpretation: <1.8 ft/sec, indicate of risk for recurrent falls   ADL: ADL Overall ADL's : Needs assistance/impaired Eating/Feeding: Set up, Sitting Grooming: Set up, Sitting Upper Body Bathing: Supervision/ safety, Set up, Minimal assistance, Sitting Lower Body Bathing: Supervison/ safety, Set up, Min guard, Moderate assistance, Sit to/from stand Lower Body Bathing Details (indicate cue type and reason): RLE deficits and decreased safety with RW Upper Body Dressing : Supervision/safety, Set up, Moderate assistance, Sitting Lower Body Dressing: Supervision/safety, Set up, Maximal assistance Lower Body Dressing Details (indicate cue type and reason): R hip pain and dcreased AROM to RLE.  R shoulder soreness and numbness to fingertips.  Unable to reach B feet. Toilet Transfer: Supervision/safety, Set up, Min guard, Minimal assistance, RW Functional mobility during ADLs: Minimal assistance, Min guard, Set up, Supervision/safety   Cognition: Cognition Overall Cognitive Status: Within Functional Limits for tasks assessed Orientation Level: Oriented X4  Cognition Arousal/Alertness: Awake/alert Behavior During Therapy: Flat affect Overall Cognitive Status: Within Functional Limits for tasks assessed General Comments: no processing, complex thought, or command following deficits noted.     Blood pressure 123/70, pulse 98, temperature 98 F (36.7 C), temperature source Oral, resp. rate 16, height 5' 11"  (1.803 m), weight 103.9 kg, SpO2 96 %. Physical Exam Vitals and nursing note reviewed.  Constitutional:      Comments: Flat affect. Large bruise on right cheek. - sleeping initially, woke to verbal stimuli, flat, but appropriate, NAD  HENT:     Head: Contusion present.     Comments: Right facial edema with ecchymosis and mild numbness.  Ecchymotic areas posterior pharynx (recent uvular  Surgery) Smile equal- tongue midline    Right  Ear: External ear normal.     Left Ear: External ear normal.     Nose: Nose normal. No congestion.     Mouth/Throat:     Mouth: Mucous membranes are dry.     Pharynx: Oropharynx is clear.  Eyes:     General:        Right eye: No discharge.        Left eye: No discharge.     Extraocular Movements: Extraocular movements intact.     Comments: EOMI B/L  Cardiovascular:     Rate and Rhythm: Regular rhythm. Tachycardia present.     Heart sounds: Normal heart sounds.  Pulmonary:     Comments: CTA B/L- no W/R/R- good air movement Abdominal:     Comments: Soft, NT, ND, (+)BS - hypoactive  Genitourinary:    Comments: Foley in place- pt thought was condom cathter Musculoskeletal:     Cervical back: Normal range of motion. No rigidity.     Comments: RUE/RLE with 2+ nonpitting edema.  LUE- 5-/5 in biceps, triceps, WE, grip and finger abd RUE- bicep 2/5, triceps 3-/5, WE 3-/5, grip 3/5, finger abd 3/5 LLE- 4/4 in HF, KE, KF, DF and PF RLE- HF 2-/5, KE and KF 2-/5, DF 1/5, PF 0/5  Skin:    Comments: Bruise- purple on R face Edema as detailed above- Heels OK; unable to look at backside  Neurological:     Mental Status: He is alert.     Comments: Speech soft without dysarthria. Able to follow multi step commands without difficulty. RLE flaccid and RUE with proximal>distal weakness.  Slow to process/answer Decreased to light touch RUE/RLE_ but absent on R foot/toes  Psychiatric:     Comments: Flat affect        Lab Results Last 24 Hours  No results found for this or any previous visit (from the past 24 hour(s)).    Imaging Results (Last 48 hours)  IR Fluoro Guide CV Line Right   Result Date: 10/30/2019 INDICATION: 49 year old male with a history of rhabdomyolysis, referred for tunneled hemodialysis catheter EXAM: IMAGE GUIDED PLACEMENT OF TUNNELED HEMODIALYSIS CATHETER MEDICATIONS: 2 g Ancef; The antibiotic was administered within an appropriate time interval prior to skin puncture.  ANESTHESIA/SEDATION: Moderate (conscious) sedation was employed during this procedure. A total of Versed 2.0 mg and Fentanyl 100 mcg was administered intravenously. Moderate Sedation Time: 18 minutes. The patient's level of consciousness and vital signs were monitored continuously by radiology nursing throughout the procedure under my direct supervision. FLUOROSCOPY TIME:  Fluoroscopy Time: 0 minutes 6 seconds (1 mGy). COMPLICATIONS: None PROCEDURE: Informed written consent was obtained from the patient after a discussion of the risks, benefits, and alternatives to treatment. Questions regarding the procedure  were encouraged and answered. The right neck and chest were prepped with chlorhexidine in a sterile fashion, and a sterile drape was applied covering the operative field. Maximum barrier sterile technique with sterile gowns and gloves were used for the procedure. A timeout was performed prior to the initiation of the procedure. Ultrasound survey was performed. Micropuncture kit was utilized to access the right internal jugular vein under direct, real-time ultrasound guidance after the overlying soft tissues were anesthetized with 1% lidocaine with epinephrine. Stab incision was made with 11 blade scalpel. Microwire was passed centrally. The microwire was then marked to measure appropriate internal catheter length. External tunneled length was estimated. A total tip to cuff length of 19 cm was selected. 035 guidewire was advanced to the level of the IVC. Skin and subcutaneous tissues of chest wall below the clavicle were generously infiltrated with 1% lidocaine for local anesthesia. A small stab incision was made with 11 blade scalpel. The selected hemodialysis catheter was tunneled in a retrograde fashion from the anterior chest wall to the venotomy incision. Serial dilation was performed and then a peel-away sheath was placed. The catheter was then placed through the peel-away sheath with tips ultimately  positioned within the superior aspect of the right atrium. Final catheter positioning was confirmed and documented with a spot radiographic image. The catheter aspirates and flushes normally. The catheter was flushed with appropriate volume heparin dwells. The catheter exit site was secured with a 0-Prolene retention suture. Gel-Foam slurry was infused into the soft tissue tract. The venotomy incision was closed Derma bond and sterile dressing. Dressings were applied at the chest wall. Patient tolerated the procedure well and remained hemodynamically stable throughout. No complications were encountered and no significant blood loss encountered. IMPRESSION: Status post right IJ tunneled hemodialysis catheter. Signed, Dulcy Fanny. Dellia Nims, RPVI [facility] Radiology Electronically Signed   By: Corrie Mckusick D.O.   On: 10/30/2019 18:21    IR US Guide Vasc Access Right   Result Date: 10/30/2019 INDICATION: 49 year old male with a history of rhabdomyolysis, referred for tunneled hemodialysis catheter EXAM: IMAGE GUIDED PLACEMENT OF TUNNELED HEMODIALYSIS CATHETER MEDICATIONS: 2 g Ancef; The antibiotic was administered within an appropriate time interval prior to skin puncture. ANESTHESIA/SEDATION: Moderate (conscious) sedation was employed during this procedure. A total of Versed 2.0 mg and Fentanyl 100 mcg was administered intravenously. Moderate Sedation Time: 18 minutes. The patient's level of consciousness and vital signs were monitored continuously by radiology nursing throughout the procedure under my direct supervision. FLUOROSCOPY TIME:  Fluoroscopy Time: 0 minutes 6 seconds (1 mGy). COMPLICATIONS: None PROCEDURE: Informed written consent was obtained from the patient after a discussion of the risks, benefits, and alternatives to treatment. Questions regarding the procedure were encouraged and answered. The right neck and chest were prepped with chlorhexidine in a  sterile fashion, and a sterile drape was applied covering the operative field. Maximum barrier sterile technique with sterile gowns and gloves were used for the procedure. A timeout was performed prior to the initiation of the procedure. Ultrasound survey was performed. Micropuncture kit was utilized to access the right internal jugular vein under direct, real-time ultrasound guidance after the overlying soft tissues were anesthetized with 1% lidocaine with epinephrine. Stab incision was made with 11 blade scalpel. Microwire was passed centrally. The microwire was then marked to measure appropriate internal catheter length. External tunneled length was estimated. A total tip to cuff length of 19 cm was selected. 035 guidewire was advanced to the  level of the IVC. Skin and subcutaneous tissues of chest wall below the clavicle were generously infiltrated with 1% lidocaine for local anesthesia. A small stab incision was made with 11 blade scalpel. The selected hemodialysis catheter was tunneled in a retrograde fashion from the anterior chest wall to the venotomy incision. Serial dilation was performed and then a peel-away sheath was placed. The catheter was then placed through the peel-away sheath with tips ultimately positioned within the superior aspect of the right atrium. Final catheter positioning was confirmed and documented with a spot radiographic image. The catheter aspirates and flushes normally. The catheter was flushed with appropriate volume heparin dwells. The catheter exit site was secured with a 0-Prolene retention suture. Gel-Foam slurry was infused into the soft tissue tract. The venotomy incision was closed Derma bond and sterile dressing. Dressings were applied at the chest wall. Patient tolerated the procedure well and remained hemodynamically stable throughout. No complications were encountered and no significant blood loss encountered. IMPRESSION: Status post right IJ tunneled hemodialysis  catheter. Signed, Dulcy Fanny. Dellia Nims, RPVI [facility] Radiology Electronically Signed   By: Corrie Mckusick D.O.   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 Does the need for close, 24 hr/day medical supervision in concert with the patient's rehab needs make it unreasonable for this patient to be served in a less intensive setting? Yes 2. Co-Morbidities requiring supervision/potential complications: Crohn's disease, depression, hx of PE, polysubstance use, HD due to ARF, anuric 3. Due to bladder management, bowel management, safety, skin/wound care, disease management, medication administration, pain management and patient education, does the patient require 24 hr/day rehab nursing? Yes 4. Does the patient require coordinated care of a physician, rehab nurse, therapy disciplines of PT and OT to address physical and functional deficits in the context of the above medical diagnosis(es)? Yes Addressing deficits in the following areas: balance, endurance, locomotion, strength, transferring, bowel/bladder control, bathing, dressing, feeding, grooming and toileting 5. Can the patient actively participate in an intensive therapy program of at least 3 hrs of therapy per day at least 5 days per week? Yes 6. The potential for patient to make measurable gains while on inpatient rehab is good 7. Anticipated functional outcomes upon discharge from inpatient rehab are modified independent, supervision and min assist  with PT, modified independent and supervision with OT, n/a with SLP. 8. Estimated rehab length of stay to reach the above functional goals is: 2-2.5 weeks- maybe 3 weeks 9. Anticipated discharge destination: Home 10. Overall Rehab/Functional Prognosis: good    RECOMMENDATIONS: This patient's condition is appropriate for continued rehabilitative care in the following setting: CIR Patient has agreed to participate in recommended program. Potentially Note that insurance prior authorization may be required for reimbursement for recommended care.   Comment:  1. Pt truly wants to get back on regular schedule to sleep- suggest trazodone  50-100 mg QHS for sleep- start with 50 mg as can titrate up.  2. Could use elevation and possible wrapping to work on R sided edema.  3. Will submit for CIR- is appropriate candidate- will let admissions coordinators determine insurance issues.  4. Thank you for this consult       Bary Leriche, PA-C 11/01/2019    I have personally performed a face to face diagnostic evaluation  of this patient and formulated the key components of the plan.  Additionally, I have personally reviewed laboratory data, imaging studies, as well as relevant notes and concur with the physician assistant's documentation above.              Revision History                     Routing History Note Details  Jan Fireman, MD File Time 11/01/2019  3:24 PM  Author Type Physician Status Signed  Last Editor Courtney Heys, MD Service Physical Medicine and Rehabilitation

## 2019-11-09 NOTE — PMR Pre-admission (Addendum)
PMR Admission Coordinator Pre-Admission Assessment  Patient: [name] is an 49 y.o., male MRN: [id number] DOB: 1971/02/19 Height: 5' 11"  (180.3 cm) Weight: 103.8 kg              Insurance Information HMO:     PPO: yes     PCP:      IPA:      80/20:      OTHER:  PRIMARY: [employer] [place]      Policy#: [id number]      Subscriber: pt CM Name: Crystal      Phone#: via fax approval phone [phone]   Fax#: [phone] Pre-Cert#: [id number] approved until 11/21/2019 when updates are due      Employer:  Benefits:  Phone #: [phone]     Name:  Eff. Date: 02/23/2019     Deduct: $2500 ( met)     Out of Pocket Max: $6000 ( met $3329.70)  Life Max: none  CIR: 80%      SNF: 80% 60 days per year Outpatient: $50 PER VISIT    Co-Pay: 30 VISITS combined PT and OT; 30 visits SLP Home Health: 80%      Co-Pay: visits limited per medical neccesity DME: 80%     Co-Pay: 20% Providers: in network  SECONDARY: none      Policy#:       Phone#:   Development worker, community:       Phone#:   The Engineer, petroleum" for patients in Inpatient Rehabilitation Facilities with attached "Privacy Act [place] Records" was provided and verbally reviewed with: N/A  Emergency Contact Information Contact Information    Name Relation Home Work Mobile   [name] Spouse [phone]  [phone]   [place],Tom Father [phone]      Current Medical History  Patient Admitting Diagnosis: CVA's, AKI  History of Present Illness:49 y.o. male with history of Crohn's, PE, depression who was admitted on 10/27/19 with RUE/RLE weakness. Per reports, the night before the patient was intoxicated on alcohol, took a [name] and blacked out on a friend's brick floor and laid on this right side till the next morning. UDS positive for cocaine/ETOH level 24.  He work up with severe pain right sided with swollen leg/arm, was hypotensive, had acute renal failure with rhabdomyolysis and treated with fluid  boluses and Lokelma. MRI brain showed acute punctate infarcts in right occipital lobe and right cerebellum as well as restricted diffusion in globi pallidi. MRI Cspine/lumbar spine done due to weakness and showed mild right and left C4/5 foraminal stenosis and mild facet arthrosis L4-S1. Nephrology following for assistance with renal failure and HD started due to poor recovery.   Dr. Leonel Ramsay felt that weakness was likely a combination--peripheral in nature due to neurapraxia and rhabdomyolysis. Cerebral injury felt to be due to overdose and prolonged down time--question fentanyl/cocaine combo and supportive care recommended. Dr. Stann Mainland following for input on RUE/RLE edema and recommended monitoring for compartment syndrome.   Complete NIHSS TOTAL: 5 Glasgow Coma Scale Score: 15  Past Medical History  Past Medical History:  Diagnosis Date  . Allergy   . Crohn's colitis ([place])   . Depression   . Pulmonary embolism ([facility])     Family History  family history includes Colon polyps in his father; Multiple sclerosis in his sister.  Prior Rehab/Hospitalizations:  Has the patient had prior rehab or hospitalizations prior to admission? Yes  Has the patient had major surgery during 100 days prior to admission? Yes  Current  Medications   Current Facility-Administered Medications:  .  0.9 %  sodium chloride infusion, 100 mL, Intravenous, PRN, Madelon Lips, MD .  0.9 %  sodium chloride infusion, 100 mL, Intravenous, PRN, Madelon Lips, MD .  alteplase (CATHFLO ACTIVASE) injection 2 mg, 2 mg, Intracatheter, Once PRN, Madelon Lips, MD .  aspirin EC tablet 81 mg, 81 mg, Oral, Daily, Greta Doom, MD, 81 mg at 11/09/19 1138 .  calcium acetate (PHOSLO) capsule 667 mg, 667 mg, Oral, TID WC, Claudia Desanctis, MD, 667 mg at 11/09/19 1138 .  calcium carbonate (TUMS - dosed in mg elemental calcium) chewable tablet 400 mg of elemental calcium, 400 mg of elemental calcium, Oral, BID,  Madelon Lips, MD, 400 mg of elemental calcium at 11/09/19 1140 .  Chlorhexidine Gluconate Cloth 2 % PADS 6 each, 6 each, Topical, Daily, Alma Friendly, MD, 6 each at 11/09/19 1141 .  Darbepoetin Alfa (ARANESP) 40 MCG/0.4ML injection, , , ,  .  ferric gluconate (NULECIT) 125 mg in sodium chloride 0.9 % 100 mL IVPB, 125 mg, Intravenous, Q M,W,F-HD, Harrie Jeans C, MD, Last Rate: 110 mL/hr at 11/09/19 0930, 125 mg at 11/09/19 0930 .  heparin injection 1,000 Units, 1,000 Units, Dialysis, PRN, Madelon Lips, MD, 3,200 Units at 11/09/19 1050 .  heparin injection 5,000 Units, 5,000 Units, Subcutaneous, Q8H, Lenore Cordia, MD, 5,000 Units at 11/09/19 0501 .  HYDROmorphone (DILAUDID) injection 0.5 mg, 0.5 mg, Intravenous, Q4H PRN, Zada Finders R, MD, 0.5 mg at 11/09/19 1138 .  influenza vac split quadrivalent PF (FLUARIX) injection 0.5 mL, 0.5 mL, Intramuscular, Once, Shawna Clamp, MD .  lidocaine (PF) (XYLOCAINE) 1 % injection 5 mL, 5 mL, Intradermal, PRN, Madelon Lips, MD .  lidocaine-prilocaine (EMLA) cream 1 application, 1 application, Topical, PRN, Madelon Lips, MD .  ondansetron [facility] [address]) tablet 4 mg, 4 mg, Oral, Q6H PRN **OR** ondansetron (ZOFRAN) injection 4 mg, 4 mg, Intravenous, Q6H PRN, Lenore Cordia, MD .  pentafluoroprop-tetrafluoroeth (GEBAUERS) aerosol 1 application, 1 application, Topical, PRN, Madelon Lips, MD .  senna-docusate (Senokot-S) tablet 1 tablet, 1 tablet, Oral, QHS PRN, Lenore Cordia, MD, 1 tablet at 11/06/19 0012  Facility-Administered Medications Ordered in Other Encounters:  .  loratadine (CLARITIN) tablet 10 mg, 10 mg, Oral, Daily, Garlan Fair, MD  Patients Current Diet:  Diet Order            Diet renal with fluid restriction Fluid restriction: Other (see comments); Room service appropriate? Yes with Assist; Fluid consistency: Thin  Diet effective now                 Precautions / Restrictions Precautions Precautions:  Fall Precaution Comments: foot drop on RLE Restrictions Weight Bearing Restrictions: No   Has the patient had 2 or more falls or a fall with injury in the past year?Yes  Prior Activity Level Community (5-7x/wk): Independent, working and driving  Prior Functional Level Prior Function Level of Independence: Independent  Self Care: Did the patient need help bathing, dressing, using the toilet or eating?  Independent  Indoor Mobility: Did the patient need assistance with walking from room to room (with or without device)? Independent  Stairs: Did the patient need assistance with internal or external stairs (with or without device)? Independent  Functional Cognition: Did the patient need help planning regular tasks such as shopping or remembering to take medications? Independent  Home Assistive Devices / Equipment Home Assistive Devices/Equipment: None Home Equipment: None  Prior Device Use: Indicate  devices/aids used by the patient prior to current illness, exacerbation or injury? None of the above  Current Functional Level Cognition  Arousal/Alertness: Awake/alert Overall Cognitive Status: Impaired/Different from baseline Orientation Level: Oriented X4 Safety/Judgement: Decreased awareness of safety General Comments: pt continues to have decreased awareness of his deficits, and safety. Decreased attention to his R side Attention: Sustained Sustained Attention: Impaired Sustained Attention Impairment: Verbal complex Memory: Impaired (3/5 words independently recalled, 2 of 5 with category cues) Memory Impairment: Retrieval deficit, Other (comment) (decreased working memory) Problem Solving: Impaired Problem Solving Impairment: Functional complex Safety/Judgment: Appears intact    Extremity Assessment (includes Sensation/Coordination)  Upper Extremity Assessment: RUE deficits/detail RUE Deficits / Details: c/o pain with end range elbow flexion and Shoulder flexion.  R UE MMT  grossly  3+/5.  RUE WNL's AROM and MMT RUE Sensation: decreased light touch RUE Coordination: decreased fine motor (patient reports better than previous day)  Lower Extremity Assessment: Defer to PT evaluation RLE Deficits / Details: AAROM right knee to 90 degrees, hip flexion to 90 degrees, unable to df --all ROM limited by pain and edema. strength grossly 2/5 RLE: Unable to fully assess due to pain    ADLs  Overall ADL's : Needs assistance/impaired Eating/Feeding: Set up, Sitting Grooming: Min guard, Standing, Cueing for safety Grooming Details (indicate cue type and reason): washed hands and brushed teeth standing at sink with RW Upper Body Bathing: Supervision/ safety, Set up, Minimal assistance, Sitting Lower Body Bathing: Supervison/ safety, Set up, Min guard, Moderate assistance, Sit to/from stand Lower Body Bathing Details (indicate cue type and reason): RLE deficits and decreased safety with RW Upper Body Dressing : Supervision/safety, Set up, Moderate assistance, Sitting Lower Body Dressing: Supervision/safety, Set up, Maximal assistance Lower Body Dressing Details (indicate cue type and reason): R hip pain and dcreased AROM to RLE.  R shoulder soreness and numbness to fingertips.  Unable to reach B feet. Toilet Transfer: Min guard, Supervision/safety, Regular Toilet, Grab bars, Surveyor, mining Details (indicate cue type and reason): cues for standing completely in front of toilet before sitting Functional mobility during ADLs: Min guard, Cueing for safety, Cueing for sequencing, Rolling walker General ADL Comments: Session addressed management of RUE edema with retrograde massage and AROM exercises.     Mobility  Overal bed mobility: Needs Assistance Bed Mobility: Supine to Sit Supine to sit: HOB elevated, Min guard Sit to supine: Min assist General bed mobility comments: minA for management of R LE off bed, vc use of L LE to offweight hips to aid in movement of R LE, slight  dizziness with coming to seated but quickly dissipates     Transfers  Overall transfer level: Needs assistance Equipment used: Rolling walker (2 wheeled) Transfers: Sit to/from Stand Sit to Stand: Min guard, Min assist Stand pivot transfers: Supervision, Min guard General transfer comment: min A for steadying with initial sit>stand, vc for hand placement, pt with min A for lowering to toilet, poor judgement of distance of toilet behind him with short sit despite vc for backing up further, min guard for subsequent sit>stand after rest break and into recliner, maximal vc for positioning    Ambulation / Gait / Stairs / Wheelchair Mobility  Ambulation/Gait Ambulation/Gait assistance: Herbalist (Feet): 35 Feet Assistive device: Rolling walker (2 wheeled) Gait Pattern/deviations: Step-through pattern, Decreased step length - right, Decreased step length - left General Gait Details: min physical A for steadying, however maximal multimodal cues for awareness of R sided positioning, and with  increasing R sided weakness with distance, after bout of R sided buckling asked pt if he wanted to go further and he replied yes, buckling increased with next few steps. PT pointed out that he probably should have said no due to his weakness. Pt in agreement. With return to recliner in room encourged multiple standing rest breaks to recover strength  Gait velocity: decr Gait velocity interpretation: <1.31 ft/sec, indicative of household ambulator    Posture / Balance Balance Overall balance assessment: Needs assistance Sitting-balance support: No upper extremity supported, Feet supported Sitting balance-Leahy Scale: Good Standing balance support: No upper extremity supported, During functional activity Standing balance-Leahy Scale: Fair Standing balance comment: able to stand at [facility] with no UE support     Special needs/care consideration New to hemodialysis this admission; AKI with outpatient  dialysis center setup to be arranged by Terri Piedra, SW Visitor is wife, Amy    Previous Home Environment  Living Arrangements: Spouse/significant other, Children  Lives With: Spouse Available Help at Discharge: Family, Available PRN/intermittently Type of Home: House Home Layout: One level Home Access: Stairs to enter [employer] of Steps: 2-3 Bathroom Shower/Tub: Tub/shower unit, Multimedia programmer: Standard Bathroom Accessibility: Yes How Accessible: Accessible via walker [place]: No  Discharge Living Setting Plans for Discharge Living Setting: Patient's home, Lives with (comment) (wife and 65 year old daughter) Type of Home at Discharge: House Discharge Home Layout: One level Discharge Home Access: Stairs to enter Entrance Stairs-Rails: None Entrance Stairs-Number of Steps: 2-3 Discharge Bathroom Shower/Tub: Tub/shower unit, Horticulturist, commercial: Standard Discharge Bathroom Accessibility: Yes How Accessible: Accessible via walker Does the patient have any problems obtaining your medications?: No  Social/Family/Support Systems Patient Roles: Spouse, Parent (employee) Contact Information: wife, Amy Anticipated Caregiver: wife Anticipated Caregiver's Contact Information: see above Ability/Limitations of Caregiver: works days Caregiver Availability: Intermittent Discharge Plan Discussed with Primary Caregiver: Yes Is Caregiver In Agreement with Plan?: Yes Does Caregiver/Family have Issues with Lodging/Transportation while Pt is in Rehab?: No  Goals Patient/Family Goal for Rehab: Mod I with PT and OT Expected length of stay: ELOS 7 to 10 days Additional Information: New to hemodialysis Pt/Family Agrees to Admission and willing to participate: Yes Program Orientation Provided & Reviewed with Pt/Caregiver Including Roles  & Responsibilities: Yes  Decrease burden of Care through IP rehab admission: n/a  Possible need  for SNF placement upon discharge:not anticipated  Patient Condition: This patient's medical and functional status has changed since the consult dated: 11/01/2019 in which the Rehabilitation Physician determined and documented that the patient's condition is appropriate for intensive rehabilitative care in an inpatient rehabilitation facility. See "History of Present Illness" (above) for medical update. Functional changes are: overall min to mod assist. Patient's medical and functional status update has been discussed with the Rehabilitation physician and patient remains appropriate for inpatient rehabilitation. Will admit to inpatient rehab today.  Preadmission Screen Completed By:  Cleatrice Burke, RN, 11/09/2019 11:50 AM ______________________________________________________________________   Discussed status with Dr. Naaman Plummer on 11/09/2019 at  47 and received approval for admission today.  Admission Coordinator:  Cleatrice Burke, time 2536 Date 11/09/2019

## 2019-11-09 NOTE — H&P (Signed)
Physical Medicine and Rehabilitation Admission H&P    CC: Functional deficits due to stroke and rhabdomyolysis.   HPI: [name] is a 49 year old male with history of Crohn's, PE, depression, recent uvula surgery; who was admitted on 10/27/2019 with RUE/RLE weakness. Per reports, the night before patient was intoxicated on alcohol, took a [name] and blacked out on friends break flow and rate on his right side till the next morning. UDS positive for cocaine and EtOH level-24. He woke up with severe pain on right side with swelling of his arm and leg, was hypotensive at admission, had acute renal failure with rhabdomyolysis. He was treated with IV fluids as well as Lokelma. MRI brain showed acute punctate infarcts in right occipital lobe and right cerebellum as well as restricted diffusion globi pallidi, MRI of cervical and lumbar spine done due to weakness on the right and showed mild right and left C8/4 foraminal stenosis with mild facet arthrosis L4-S1. MRA head/neck negative for LVO. Renal ultrasound was negative for hydronephrosis and showed normal echogenicity.   Nephrology following for input on acute renal failure and hemodialysis initiated due to poor recovery and poor urine output. Dr. Leonel Ramsay felt that weakness was peripheral in nature due to neuropraxia and rhabdomyolysis. Cerebral injury felt to be due to overdose, hypotension with prolonged downtime, question of fentanyl/cocaine combo--supportive care recommended. 2D echo showed EF of 55% with normal function. Right hip films were negative for fracture. Dr. Stann Mainland also consulted for input on edema RUE/RLE and recommended monitoring for compartment syndrome. RUE Dopplers done on 09/10 due to increasing edema and was negative for DVT or superficial thrombosis. CPK on downward trend. Hemodialysis ongoing MWF and CLIP process initiated. Normocytic anemia being managed with IV iron X 3 and ESA added today. Has been getting IV Dilaudid as  needed for pain. He continues to have limitations due to right-sided weakness with sensory deficits and higher level cognitive deficits. CIR recommended due to functional decline.  --Received flu vaccine this am.    Review of Systems  Constitutional: Negative for fever.  HENT: Negative for tinnitus.   Eyes: Negative for double vision.  Respiratory: Negative for cough.   Cardiovascular: Negative for palpitations.  Gastrointestinal: Positive for constipation and nausea.  Genitourinary: Negative.   Musculoskeletal: Positive for joint pain and myalgias.  Skin: Negative for itching.  Neurological: Positive for tingling, sensory change, focal weakness and weakness.  Psychiatric/Behavioral: Negative for suicidal ideas.      Past Medical History:  Diagnosis Date  . Allergy   . Crohn's colitis ([place])   . Depression   . Pulmonary embolism [facility])     Past Surgical History:  Procedure Laterality Date  . COLOSTOMY  March 2011  . Colostomy reversal  January 2012  . HIP SURGERY  R hip surgery   . IR FLUORO GUIDE CV LINE RIGHT  10/30/2019  . IR US GUIDE VASC ACCESS RIGHT  10/30/2019    Family History  Problem Relation Age of Onset  . Colon polyps Father   . Multiple sclerosis Sister   . Colon cancer Neg Hx   . Esophageal cancer Neg Hx   . Stomach cancer Neg Hx   . Pancreatic cancer Neg Hx   . Liver disease Neg Hx     Social History: Married. Independent and working. Reports that he has been smoking. He has been smoking about 0.50 packs per day. He has never used smokeless tobacco. He reports current alcohol use--3-4 beers/liqou most days of  the week. He reports that he uses drugs on rare occasions.     Allergies: No Known Allergies    Medications Prior to Admission  Medication Sig Dispense Refill  . acetaminophen (TYLENOL) 500 MG tablet Take 1,500 mg by mouth 2 (two) times daily as needed for mild pain or moderate pain.     Marland Kitchen inFLIXimab (REMICADE) 100 MG injection Inject 400 mg  into the vein every 8 (eight) weeks.    Marland Kitchen loratadine (CLARITIN) 10 MG tablet Take 10 mg by mouth as needed for allergies.    . sildenafil (VIAGRA) 50 MG tablet Take 50 mg by mouth daily as needed for erectile dysfunction.      Drug Regimen Review  Drug regimen was reviewed and remains appropriate with no significant issues identified  Home: Home Living Family/patient expects to be discharged to:: Private residence Living Arrangements: Spouse/significant other, Children Available Help at Discharge: Family Type of Home: House Home Access: Stairs to enter Technical brewer of Steps: 2-3 Home Layout: One level Bathroom Shower/Tub: Tub/shower unit, Multimedia programmer: Programmer, systems: Yes Home Equipment: None   Functional History: Prior Function Level of Independence: Independent  Functional Status:  Mobility: Bed Mobility Overal bed mobility: Needs Assistance Bed Mobility: Supine to Sit Supine to sit: HOB elevated, Min guard Sit to supine: Min assist General bed mobility comments: minA for management of R LE off bed, vc use of L LE to offweight hips to aid in movement of R LE, slight dizziness with coming to seated but quickly dissipates  Transfers Overall transfer level: Needs assistance Equipment used: Rolling walker (2 wheeled) Transfers: Sit to/from Stand Sit to Stand: Min guard, Min assist Stand pivot transfers: Supervision, Min guard General transfer comment: min A for steadying with initial sit>stand, vc for hand placement, pt with min A for lowering to toilet, poor judgement of distance of toilet behind him with short sit despite vc for backing up further, min guard for subsequent sit>stand after rest break and into recliner, maximal vc for positioning Ambulation/Gait Ambulation/Gait assistance: Min assist Gait Distance (Feet): 35 Feet Assistive device: Rolling walker (2 wheeled) Gait Pattern/deviations: Step-through pattern, Decreased  step length - right, Decreased step length - left General Gait Details: min physical A for steadying, however maximal multimodal cues for awareness of R sided positioning, and with increasing R sided weakness with distance, after bout of R sided buckling asked pt if he wanted to go further and he replied yes, buckling increased with next few steps. PT pointed out that he probably should have said no due to his weakness. Pt in agreement. With return to recliner in room encourged multiple standing rest breaks to recover strength  Gait velocity: decr Gait velocity interpretation: <1.31 ft/sec, indicative of household ambulator    ADL: ADL Overall ADL's : Needs assistance/impaired Eating/Feeding: Set up, Sitting Grooming: Min guard, Standing, Cueing for safety Grooming Details (indicate cue type and reason): washed hands and brushed teeth standing at sink with RW Upper Body Bathing: Supervision/ safety, Set up, Minimal assistance, Sitting Lower Body Bathing: Supervison/ safety, Set up, Min guard, Moderate assistance, Sit to/from stand Lower Body Bathing Details (indicate cue type and reason): RLE deficits and decreased safety with RW Upper Body Dressing : Supervision/safety, Set up, Moderate assistance, Sitting Lower Body Dressing: Supervision/safety, Set up, Maximal assistance Lower Body Dressing Details (indicate cue type and reason): R hip pain and dcreased AROM to RLE.  R shoulder soreness and numbness to fingertips.  Unable to reach B  feet. Toilet Transfer: Min guard, Supervision/safety, Regular Toilet, Grab bars, RW Toilet Transfer Details (indicate cue type and reason): cues for standing completely in front of toilet before sitting Functional mobility during ADLs: Min guard, Cueing for safety, Cueing for sequencing, Rolling walker General ADL Comments: Session addressed management of RUE edema with retrograde massage and AROM exercises.   Cognition: Cognition Overall Cognitive Status:  Impaired/Different from baseline Arousal/Alertness: Awake/alert Orientation Level: Oriented X4 Attention: Sustained Sustained Attention: Impaired Sustained Attention Impairment: Verbal complex Memory: Impaired (3/5 words independently recalled, 2 of 5 with category cues) Memory Impairment: Retrieval deficit, Other (comment) (decreased working memory) Problem Solving: Impaired Problem Solving Impairment: Functional complex Safety/Judgment: Appears intact Cognition Arousal/Alertness: Awake/alert Behavior During Therapy: Flat affect Overall Cognitive Status: Impaired/Different from baseline Area of Impairment: Safety/judgement, Problem solving, Awareness Safety/Judgement: Decreased awareness of safety Awareness: Emergent Problem Solving: Slow processing, Requires verbal cues General Comments: pt continues to have decreased awareness of his deficits, and safety. Decreased attention to his R side   Blood pressure 101/62, pulse 89, temperature 98.4 F (36.9 C), temperature source Oral, resp. rate 15, height 5' 11"  (1.803 m), weight 106.2 kg, SpO2 98 %. Physical Exam Vitals and nursing note reviewed.  Constitutional:      Comments: Frail appearing  HENT:     Head:     Comments: Wound/abrasion right maxilla    Nose: Nose normal.     Mouth/Throat:     Mouth: Mucous membranes are moist.     Pharynx: No oropharyngeal exudate.  Eyes:     Conjunctiva/sclera: Conjunctivae normal.     Pupils: Pupils are equal, round, and reactive to light.  Cardiovascular:     Rate and Rhythm: Normal rate and regular rhythm.     Heart sounds: No murmur heard.  No friction rub.  Pulmonary:     Effort: Pulmonary effort is normal.     Breath sounds: Normal breath sounds.  Abdominal:     General: Abdomen is flat. Bowel sounds are normal. There is no distension.     Palpations: Abdomen is soft.     Tenderness: There is no abdominal tenderness.  Musculoskeletal:     Comments: 2+ edema RUE and RLE   Skin:    Comments: Scattered abrasions right arm/leg  Neurological:     Mental Status: He is alert.     Comments: Alert and oriented x 3. Normal insight and awareness. Intact Memory. Normal language and speech. Cranial nerve exam unremarkable. RUE 3+ to 4-/5. RLE 1-2/5 HE, HAD,HAB, 1/5 KE and trace to absent ADF/PF. Decreased LT right arm and leg, hypersensitive to touch. DTR's absent  Psychiatric:        Mood and Affect: Mood normal.        Behavior: Behavior normal.     Results for orders placed or performed during the hospital encounter of 10/27/19 (from the past 48 hour(s))  CBC with Differential/Platelet     Status: Abnormal   Collection Time: 11/08/19  6:08 AM  Result Value Ref Range   WBC 13.8 (H) 4.0 - 10.5 K/uL   RBC 3.01 (L) 4.22 - 5.81 MIL/uL   Hemoglobin 9.0 (L) 13.0 - 17.0 g/dL   HCT 28.0 (L) 39 - 52 %   MCV 93.0 80.0 - 100.0 fL   MCH 29.9 26.0 - 34.0 pg   MCHC 32.1 30.0 - 36.0 g/dL   RDW 14.5 11.5 - 15.5 %   Platelets 279 150 - 400 K/uL   nRBC 0.0 0.0 - 0.2 %  Neutrophils Relative % 71 %   Neutro Abs 10.1 (H) 1.7 - 7.7 K/uL   Lymphocytes Relative 12 %   Lymphs Abs 1.6 0.7 - 4.0 K/uL   Monocytes Relative 7 %   Monocytes Absolute 0.9 0 - 1 K/uL   Eosinophils Relative 2 %   Eosinophils Absolute 0.2 0 - 0 K/uL   Basophils Relative 1 %   Basophils Absolute 0.1 0 - 0 K/uL   WBC Morphology See Note     Comment: Mild Left Shift. 1 to 5% Metas and Myelos, Occ Pro Noted.   Immature Granulocytes 7 %   Abs Immature Granulocytes 0.92 (H) 0.00 - 0.07 K/uL   Polychromasia PRESENT     Comment: Performed at [facility], [place] [address], [place], [place] [address]  CK     Status: Abnormal   Collection Time: 11/08/19  6:08 AM  Result Value Ref Range   Total CK 2,968 (H) 49.0 - 397.0 U/L    Comment: Performed at [facility], [place] [address], [place], [place] [address]  Comprehensive metabolic panel     Status: Abnormal   Collection Time: 11/08/19  6:08 AM   Result Value Ref Range   Sodium 133 (L) 135 - 145 mmol/L   Potassium 4.0 3.5 - 5.1 mmol/L   Chloride 99 98 - 111 mmol/L   CO2 25 22 - 32 mmol/L   Glucose, Bld 107 (H) 70 - 99 mg/dL    Comment: Glucose reference range applies only to samples taken after fasting for at least 8 hours.   BUN 54 (H) 6 - 20 mg/dL   Creatinine, Ser 8.48 (H) 0.61 - 1.24 mg/dL   Calcium 7.9 (L) 8.9 - 10.3 mg/dL   Total Protein 4.3 (L) 6.5 - 8.1 g/dL   Albumin 2.0 (L) 3.5 - 5.0 g/dL   AST 57 (H) 15 - 41 U/L   ALT 32 0 - 44 U/L   Alkaline Phosphatase 42 38 - 126 U/L   Total Bilirubin 0.6 0.3 - 1.2 mg/dL   GFR calc non Af Amer 7 (L) >60 mL/min   GFR calc Af Amer 8 (L) >60 mL/min   Anion gap 9 5 - 15    Comment: Performed at [facility] [address], [place], [place] [address]  Glucose, capillary     Status: Abnormal   Collection Time: 11/08/19  5:19 PM  Result Value Ref Range   Glucose-Capillary 108 (H) 70 - 99 mg/dL    Comment: Glucose reference range applies only to samples taken after fasting for at least 8 hours.  CBC with Differential/Platelet     Status: Abnormal   Collection Time: 11/09/19  4:36 AM  Result Value Ref Range   WBC 12.6 (H) 4.0 - 10.5 K/uL   RBC 2.97 (L) 4.22 - 5.81 MIL/uL   Hemoglobin 9.1 (L) 13.0 - 17.0 g/dL   HCT 27.9 (L) 39 - 52 %   MCV 93.9 80.0 - 100.0 fL   MCH 30.6 26.0 - 34.0 pg   MCHC 32.6 30.0 - 36.0 g/dL   RDW 14.1 11.5 - 15.5 %   Platelets 318 150 - 400 K/uL   nRBC 0.0 0.0 - 0.2 %   Neutrophils Relative % 75 %   Neutro Abs 9.5 (H) 1.7 - 7.7 K/uL   Lymphocytes Relative 11 %   Lymphs Abs 1.4 0.7 - 4.0 K/uL   Monocytes Relative 7 %   Monocytes Absolute 0.9 0 -  1 K/uL   Eosinophils Relative 1 %   Eosinophils Absolute 0.2 0 - 0 K/uL   Basophils Relative 1 %   Basophils Absolute 0.1 0 - 0 K/uL   Immature Granulocytes 5 %   Abs Immature Granulocytes 0.64 (H) 0.00 - 0.07 K/uL    Comment: Performed at [facility] [address], [place], [place]  [address]  Basic metabolic panel     Status: Abnormal   Collection Time: 11/09/19  4:36 AM  Result Value Ref Range   Sodium 132 (L) 135 - 145 mmol/L   Potassium 4.2 3.5 - 5.1 mmol/L   Chloride 98 98 - 111 mmol/L   CO2 24 22 - 32 mmol/L   Glucose, Bld 106 (H) 70 - 99 mg/dL    Comment: Glucose reference range applies only to samples taken after fasting for at least 8 hours.   BUN 74 (H) 6 - 20 mg/dL   Creatinine, Ser 10.43 (H) 0.61 - 1.24 mg/dL   Calcium 8.0 (L) 8.9 - 10.3 mg/dL   GFR calc non Af Amer 5 (L) >60 mL/min   GFR calc Af Amer 6 (L) >60 mL/min   Anion gap 10 5 - 15    Comment: Performed at [facility] [address], [place], [place] [address]  CK     Status: Abnormal   Collection Time: 11/09/19  4:36 AM  Result Value Ref Range   Total CK 2,456 (H) 49.0 - 397.0 U/L    Comment: Performed at [facility] [address] [place],  [address]   No results found.     Medical Problem List and Plan: 1.  Right hemiparesis and generalized weakness secondary to rhabdomyolysis with neuropraxic peripheral nerve injury. Pt also with punctate occipital lobe and cerebellar infarcts, globi pallidi insults----potentially d/t anoxic injury  -patient may  shower  -ELOS/Goals: 7-10 days 2.  Antithrombotics: -DVT/anticoagulation:  Pharmaceutical: Heparin  -antiplatelet therapy: 86m daily 3. Pain Management: D/c dilaudid--has been using  2.5 mg/day--will transition to oxycodone prn.   -will ad hs gabapentin for neuropathic pain RLE 4. Mood: LCSW to follow for evaluation and support.   -antipsychotic agents: N/A 5. Neuropsych: This patient is capable of making decisions on his own behalf. 6. Skin/Wound Care: Routine pressure relief measures, local wound care asneeded.  7. Fluids/Electrolytes/Nutrition: Strict I/O. Check lytes in am.  8. AKI with rhabdomyolysis: CK- 2456 slowly coming down. HD dependent MWF- CLIP pending.  9. Crohn's disease: Remicade on hold.  10.   Abnormal LFTs: Resolving with AST 9137--> 57. Recheck LFTs in am.  11. Polysubstance abuse: Has completed CIWA protocol.  12. RUE/RLE weakness with edema and sensory loss,neuropraxia:   -PROM/AROM, edema control  -outpt EMG/NCS potentially 13. Leucocytosis: Continues to fluctuate--will monitor for signs of infection.  14. Anemia: Supplement with IV iron X 3 and Aranesp ordered.  15. Hyocalcemia: Treated with multiple doses IV calcium gluconate--now on CaCO3 bid.        PBary Leriche PA-C 11/09/2019

## 2019-11-09 NOTE — Progress Notes (Signed)
Patient ID: [name], male   DOB: 01/06/1971, 49 y.o.   MRN: [id number]  Patient arrived from 6E08 with RN via bed with belongings. Patient was oriented to room, schedule, rehab process, fall prevention plan, and health resource notebook with verbal understanding. Patient is resting comfortably in bed some complaints of tingling pain in his right hand. Call bell is within reach and bed alarm is on.  Dorthula Nettles, RN, BSN, [place] Office [phone] Cell [phone]

## 2019-11-09 NOTE — Progress Notes (Signed)
Inpatient Rehabilitation Medication Review by a Pharmacist  A complete drug regimen review was completed for this patient to identify any potential clinically significant medication issues.  Clinically significant medication issues were identified:  No  Check AMION for pharmacist assigned to patient if future medication questions/issues arise during this admission.  Pharmacist comments:   Time spent performing this drug regimen review (minutes):  46mn   Khalessi Blough S. RAlford Highland PharmD, BCPS Clinical Staff Pharmacist [email or web address]  RWayland Salinas9/17/2021 4:31 PM

## 2019-11-09 NOTE — Progress Notes (Signed)
Occupational Therapy Treatment Patient Details Name: [name] MRN: [id number] DOB: 09/08/1970 Today's Date: 11/09/2019    History of present illness 49 year old male presents with RUE and RLE weakness and pain. Pt was intoxicated and using drugs night prior to admit and fell and was on floor all night. Pt admitted with ARF and rhabdomyolysis.   Brain MRI-bilateral basal ganglia infarcts and posterior watershed infarcts on right. Pt started HD on 9/8.  PMH: PE, tobacco abuse, crohns disease, polysubstance abuse   OT comments  Pt continues to present with increased motivation to participate in therapy. Providing education on edema management through elevation, movement, and compression. Noting increased edema at RUE, RLE, and scrotum. Pt performing RUE/RLE ROM with cues for attention to R side. Fabricating scrotal sling to decrease edema and pain as well as increase functional performance. Pt reports decrease pain with sling in place. Pt performing bed mobility with Min guard A and stand pivot to recliner with Min guard-Min A and RW. Pt planning for dc to CIR later today. Answered all questions and provided education.    Follow Up Recommendations  CIR    Equipment Recommendations  Tub/shower seat    Recommendations for Other Services  (None)    Precautions / Restrictions Precautions Precautions: Fall Precaution Comments: foot drop on RLE       Mobility Bed Mobility Overal bed mobility: Needs Assistance Bed Mobility: Supine to Sit     Supine to sit: HOB elevated;Min guard     General bed mobility comments: Min Guard A for safety and cues for managing RLE  Transfers Overall transfer level: Needs assistance Equipment used: Rolling walker (2 wheeled) Transfers: Sit to/from [facility] Sit to Stand: Min guard;Min assist Stand pivot transfers: Min assist       General transfer comment: Min A for balance and to steady. Pt reporting increased comfortability  in standing with sling.    Balance Overall balance assessment: Needs assistance Sitting-balance support: No upper extremity supported;Feet supported Sitting balance-Leahy Scale: Good     Standing balance support: No upper extremity supported;During functional activity Standing balance-Leahy Scale: Fair Standing balance comment: able to stand at [employer] with no UE support                            ADL either performed or assessed with clinical judgement   ADL Overall ADL's : Needs assistance/impaired Eating/Feeding: Set up;Sitting Eating/Feeding Details (indicate cue type and reason): lunch in recliner                     Toilet Transfer: Minimal assistance;Stand-pivot;RW (simulated to recliner)           Functional mobility during ADLs: Minimal assistance;Rolling walker (stand pivot) General ADL Comments: Fabication of scrotal sling for reducing pain and edema. Pt very motivated to participate in therapy. Pt performing RUE/RLE exercises at bed level and transfer to recliner for eating lunch.     Vision   Vision Assessment?: No apparent visual deficits   Perception     Praxis      Cognition Arousal/Alertness: Awake/alert Behavior During Therapy: Flat affect Overall Cognitive Status: Impaired/Different from baseline Area of Impairment: Safety/judgement;Problem solving;Awareness;Following commands                       Following Commands: Follows one step commands with increased time;Follows multi-step commands with increased time Safety/Judgement: Decreased awareness of safety Awareness: Emergent Problem  Solving: Slow processing;Requires verbal cues General Comments: Pt continues to present with poor awarness of R. Decreased head turns and and tracking to R; during conversation, pt maintaining gaze at midline with glances to therapists (on R side) ~3 times. Pt requiring increased time for processing. Flat but motivated        Exercises  Exercises: General Upper Extremity;General Lower Extremity General Exercises - Upper Extremity Shoulder Flexion: AROM;10 reps;Limitations;Right;Supine Shoulder Flexion Limitations: Weakness ~110* Elbow Flexion: AROM;10 reps;Right;Supine Elbow Extension: AROM;10 reps;Right;Supine Wrist Flexion: AROM;10 reps;Right;Supine Wrist Extension: AROM;Right;Supine;10 reps Digit Composite Flexion: AROM;10 reps;Right;Supine Composite Extension: AROM;Right;10 reps;Supine General Exercises - Lower Extremity Hip Flexion/Marching: AAROM;Right;5 reps;Supine   Shoulder Instructions       General Comments Fabrication of scrotal sling to reduce edema and pain. Pt reports increased comfort and decreased pain. Educating pt on reduction on edema through mobility, elevation, and compression    Pertinent Vitals/ Pain       Pain Assessment: 0-10 Pain Score: 6  Pain Location: RLE Pain Descriptors / Indicators: Grimacing;Sore;Tingling;Pins and needles Pain Intervention(s): Limited activity within patient's tolerance;Monitored during session;Repositioned  Home Living     Available Help at Discharge: Family;Available PRN/intermittently                                Lives With: Spouse    Prior Functioning/Environment              Frequency  Min 2X/week        Progress Toward Goals  OT Goals(current goals can now be found in the care plan section)  Progress towards OT goals: Progressing toward goals  Acute Rehab OT Goals Patient Stated Goal: have better use of RLE  OT Goal Formulation: With patient Time For Goal Achievement: 11/16/19 Potential to Achieve Goals: Good  Plan Discharge plan needs to be updated    Co-evaluation                 AM-PAC OT "6 Clicks" Daily Activity     Outcome Measure   Help from another person eating meals?: None Help from another person taking care of personal grooming?: None Help from another person toileting, which includes using  toliet, bedpan, or urinal?: A Little Help from another person bathing (including washing, rinsing, drying)?: A Lot Help from another person to put on and taking off regular upper body clothing?: A Little Help from another person to put on and taking off regular lower body clothing?: A Lot 6 Click Score: 18    End of Session Equipment Utilized During Treatment: Gait belt;Rolling walker  OT Visit Diagnosis: Unsteadiness on feet (R26.81);Muscle weakness (generalized) (M62.81);Pain;Other (comment) Pain - Right/Left: Right Pain - part of body: Shoulder;Leg   Activity Tolerance Patient tolerated treatment well;Patient limited by pain   Patient Left in bed;with call bell/phone within reach;with bed alarm set   Nurse Communication Mobility status        Time: 1410-1432 OT Time Calculation (min): 22 min  Charges: OT General Charges $OT Visit: 1 Visit OT Treatments $Self Care/Home Management : 8-22 mins  [place], OTR/L Acute Rehab Pager: [phone] Office: [place] 11/09/2019, 3:17 PM

## 2019-11-10 ENCOUNTER — Inpatient Hospital Stay (HOSPITAL_COMMUNITY): Payer: BC Managed Care – PPO | Admitting: Physical Therapy

## 2019-11-10 ENCOUNTER — Inpatient Hospital Stay (HOSPITAL_COMMUNITY): Payer: BC Managed Care – PPO

## 2019-11-10 DIAGNOSIS — T796XXD Traumatic ischemia of muscle, subsequent encounter: Secondary | ICD-10-CM

## 2019-11-10 LAB — CBC WITH DIFFERENTIAL/PLATELET
Abs Immature Granulocytes: 0.48 10*3/uL — ABNORMAL HIGH (ref 0.00–0.07)
Basophils Absolute: 0.1 10*3/uL (ref 0.0–0.1)
Basophils Relative: 1 %
Eosinophils Absolute: 0.2 10*3/uL (ref 0.0–0.5)
Eosinophils Relative: 2 %
HCT: 25.8 % — ABNORMAL LOW (ref 39.0–52.0)
Hemoglobin: 8.1 g/dL — ABNORMAL LOW (ref 13.0–17.0)
Immature Granulocytes: 4 %
Lymphocytes Relative: 13 %
Lymphs Abs: 1.5 10*3/uL (ref 0.7–4.0)
MCH: 29.8 pg (ref 26.0–34.0)
MCHC: 31.4 g/dL (ref 30.0–36.0)
MCV: 94.9 fL (ref 80.0–100.0)
Monocytes Absolute: 1 10*3/uL (ref 0.1–1.0)
Monocytes Relative: 8 %
Neutro Abs: 8.7 10*3/uL — ABNORMAL HIGH (ref 1.7–7.7)
Neutrophils Relative %: 72 %
Platelets: 315 10*3/uL (ref 150–400)
RBC: 2.72 MIL/uL — ABNORMAL LOW (ref 4.22–5.81)
RDW: 14.5 % (ref 11.5–15.5)
WBC: 11.9 10*3/uL — ABNORMAL HIGH (ref 4.0–10.5)
nRBC: 0 % (ref 0.0–0.2)

## 2019-11-10 LAB — COMPREHENSIVE METABOLIC PANEL
ALT: 24 U/L (ref 0–44)
AST: 41 U/L (ref 15–41)
Albumin: 2 g/dL — ABNORMAL LOW (ref 3.5–5.0)
Alkaline Phosphatase: 42 U/L (ref 38–126)
Anion gap: 12 (ref 5–15)
BUN: 47 mg/dL — ABNORMAL HIGH (ref 6–20)
CO2: 25 mmol/L (ref 22–32)
Calcium: 8.4 mg/dL — ABNORMAL LOW (ref 8.9–10.3)
Chloride: 97 mmol/L — ABNORMAL LOW (ref 98–111)
Creatinine, Ser: 7.98 mg/dL — ABNORMAL HIGH (ref 0.61–1.24)
GFR calc Af Amer: 8 mL/min — ABNORMAL LOW (ref >60)
GFR calc non Af Amer: 7 mL/min — ABNORMAL LOW (ref >60)
Glucose, Bld: 106 mg/dL — ABNORMAL HIGH (ref 70–99)
Potassium: 4 mmol/L (ref 3.5–5.1)
Sodium: 134 mmol/L — ABNORMAL LOW (ref 135–145)
Total Bilirubin: 0.4 mg/dL (ref 0.3–1.2)
Total Protein: 4.4 g/dL — ABNORMAL LOW (ref 6.5–8.1)

## 2019-11-10 MED ORDER — OXYCODONE HCL 5 MG PO TABS
5.0000 mg | ORAL_TABLET | Freq: Four times a day (QID) | ORAL | Status: DC | PRN
  Administered 2019-11-10 – 2019-11-20 (×28): 5 mg via ORAL
  Filled 2019-11-10 (×30): qty 1

## 2019-11-10 MED ORDER — SORBITOL 70 % SOLN
30.0000 mL | Freq: Every day | Status: DC | PRN
  Administered 2019-11-10 – 2019-11-11 (×2): 30 mL via ORAL
  Filled 2019-11-10 (×2): qty 30

## 2019-11-10 NOTE — Progress Notes (Signed)
[facility] Progress Note  Subjective:   HD yest without issue.  Now in CIR.  No new issues.  Review of systems:   Denies shortness of breath or chest pain  Denies n/v    Vitals:   11/09/19 1655 11/09/19 1716 11/09/19 1937 11/10/19 0501  BP: 126/71  (!) 148/56 127/62  Pulse: 98  (!) 102 97  Resp: 17  15 15   Temp: 98.1 F (36.7 C)  98.4 F (36.9 C) 98.7 F (37.1 C)  TempSrc: Oral     SpO2: 97%  97% 98%  Weight:  104.9 kg  106.5 kg  Height:  5' 11"  (1.803 m)      Exam:    Gen adult male in bed in NAD   Chest clear bilat to bases; unlabored  Heart S1S2 no rub  Abd soft ntnd Ext RLE 1+ edema ; no left leg edema; LUE 2+ edema - unchanged to sl improved per his report Neuro is alert, Ox 3 provides hx and follows commands Psych no anxiety or agitation  ACCESS: R IJ TDC    Home meds:  - remicade 400 IV every 8 wks  - ciagra 50 prn/ claritin 10 prn  - prn's/ vitamins/ supplements     UDS + cocaine     CT abd 9/04 > Adrenals/Urinary Tract: Both adrenal glands appear normal. The kidneys and collecting system appear normal without evidence of urinary tract calculus or hydronephrosis. Bladder is unremarkable. Otherwise >> IMPRESSION: small portion of transverse colon and fat containing anterior umbilical hernia, however no definite evidence bowel obstruction or strangulation. Moderate amount of colonic stool. Patchy streaky airspace opacity at the right lung base which may be due to atelectasis and/or infectious etiology.     MRI brain 9/04 > IMPRESSION: 1. Symmetric restricted diffusion in the globi pallidi. This is most often seen with an acute toxic or metabolic insult (including carbon monoxide poisoning and drugs of abuse) or hypoxic-ischemic injury. No hemorrhage. 2. Punctate acute infarcts in the R occipital lobe and R cerebellum.    CXR 10/27/19 - IMPRESSION: Low lung volumes with bibasilar atelectasis. No evidence of acute traumatic injury          Assessment/ Plan: 1. AKI - suspect rhabdomyolysis w/ swollen R leg/ arm, AKI and high K+ in pt who was unconscious on the ground for too long of a time. CPK > 50K.  AKI due to hypotension/ hypovolemia and rhabdomyolysis.  S/p TDC, HD #1 10/31/19.  HD #2 9/10 then HD #3 9/11 for volume and clearance - HD per MWF schedule this week, next planned Mon - No signs of recovery yet  - per RN perhaps having some retention so doing q8h PVR - spoke with renal navigator to initiate CLIP process to start looking for a spot at an outpatient unit as an AKI - we are still monitoring inpatient for renal recovery at this time  2. Rhabdomyolysis: d/t being unconscious/found down.  CK > 50,000.  LFTs coming down   3. Hypocalcemia: repletion with PO and IV.  3.5 cath bath.   4. Hyperkalemia - K+ 7's on admit.  Improved with HD.  Off lokelma 5. Drug abuse  - counseled re: importance of cessation  6. H/o Crohn's - hx colostomy 2011, reversal in 2012, on Remicaide 7. Hyperphosphatemia - improving with HD.  Have added calcium acetate with meals.  intact PTH 139 8. Anemia normocytic - some contribution from renal failure. IV iron x 3 doses with HD.  aranesp 40 mcg once on 9/17   Recent Labs  Lab 11/06/19 0532 11/06/19 0532 11/07/19 0429 11/08/19 0608 11/09/19 0436 11/10/19 0451  K 4.1   < > 4.1   < > 4.2 4.0  BUN 63*   < > 85*   < > 74* 47*  CREATININE 8.73*   < > 10.88*   < > 10.43* 7.98*  CALCIUM 7.3*   < > 7.5*   < > 8.0* 8.4*  PHOS 6.6*  --  7.8*  --   --   --   HGB 9.6*   < > 9.5*   < > 9.1* 8.1*   < > = values in this interval not displayed.   Inpatient medications: . aspirin EC  81 mg Oral Daily  . calcium acetate  667 mg Oral TID WC  . calcium carbonate  400 mg of elemental calcium Oral BID  . Chlorhexidine Gluconate Cloth  6 each Topical Daily  . gabapentin  100 mg Oral TID  . heparin  5,000 Units Subcutaneous Q8H  . senna-docusate  1 tablet Oral QHS   . [START ON 11/12/2019] ferric  gluconate (FERRLECIT/NULECIT) IV     acetaminophen, bisacodyl, diphenhydrAMINE, guaiFENesin-dextromethorphan, heparin, lidocaine (PF), lidocaine-prilocaine, milk and molasses, pentafluoroprop-tetrafluoroeth, polyethylene glycol, prochlorperazine **OR** prochlorperazine **OR** prochlorperazine, traZODone    Justin Mend, MD 11/10/2019  8:07 AM

## 2019-11-10 NOTE — Evaluation (Signed)
Occupational Therapy Assessment and Plan  Patient Details  Name: [name] MRN: [id number] Date of Birth: 05-05-1970  OT Diagnosis: abnormal posture, acute pain, cognitive deficits, hemiplegia affecting dominant side, muscle weakness (generalized), pain in joint and swelling of limb Rehab Potential:   ELOS: 10-14   Today's Date: 11/10/2019 OT Individual Time: 8101-7510 OT Individual Time Calculation (min): 75 min     Hospital Problem: Principal Problem:   Rhabdomyolysis Active Problems:   Embolic stroke ([place])   Anoxic brain injury ([place])   Past Medical History:  Past Medical History:  Diagnosis Date  . Allergy   . Crohn's colitis ([place])   . Depression   . Pulmonary embolism [facility])    Past Surgical History:  Past Surgical History:  Procedure Laterality Date  . COLOSTOMY  March 2011  . Colostomy reversal  January 2012  . HIP SURGERY  R hip surgery   . IR FLUORO GUIDE CV LINE RIGHT  10/30/2019  . IR US GUIDE VASC ACCESS RIGHT  10/30/2019    Assessment & Plan Clinical Impression: 49 year old male presents with RUE and RLE weakness and pain. Pt was intoxicated and using drugs night prior to admit and fell and was on floor all night. Pt admitted with ARF and rhabdomyolysis.   Brain MRI-bilateral basal ganglia infarcts and posterior watershed infarcts on right. Pt started HD on 9/8.  PMH: PE, tobacco abuse, crohns disease, polysubstance abuse Patient currently requires mod with basic self-care skills secondary to muscle weakness, decreased cardiorespiratoy endurance, impaired timing and sequencing, unbalanced muscle activation, ataxia, decreased coordination and decreased motor planning, decreased attention to right, decreased awareness and decreased safety awareness and decreased standing balance, decreased postural control, hemiplegia and decreased balance strategies.  Prior to hospitalization, patient could complete BADL/IADL/vocation with independent .  Patient will benefit from  skilled intervention to decrease level of assist with basic self-care skills and increase independence with basic self-care skills prior to discharge home with care partner.  Anticipate patient will require intermittent supervision and follow up outpatient.  OT Assessment OT Barriers to Discharge: Decreased caregiver support OT Patient demonstrates impairments in the following area(s): Balance;Cognition;Edema;Endurance;Motor;Pain;Perception;Safety;Sensory OT Basic ADL's Functional Problem(s): Grooming;Bathing;Dressing;Toileting;Eating OT Advanced ADL's Functional Problem(s): Simple Meal Preparation OT Transfers Functional Problem(s): Toilet;Tub/Shower OT Additional Impairment(s): Fuctional Use of Upper Extremity OT Plan OT Intensity: Minimum of 1-2 x/day, 45 to 90 minutes OT Frequency: 5 out of 7 days OT Duration/Estimated Length of Stay: 10-14 OT Treatment/Interventions: Teacher, English as a foreign language;Therapeutic Activities;Patient/family education;Wheelchair propulsion/positioning;Therapeutic Exercise;Psychosocial support;Cognitive remediation/compensation;Community reintegration;Functional mobility training;Self Care/advanced ADL retraining;UE/LE Strength taining/ROM;UE/LE Coordination activities;Skin care/wound managment;Neuromuscular re-education;Discharge planning;Disease mangement/prevention;Pain management;Splinting/orthotics OT Self Feeding Anticipated Outcome(s): MOD I OT Basic Self-Care Anticipated Outcome(s): MOD I OT Toileting Anticipated Outcome(s): MOD I toilet; S shower OT Bathroom Transfers Anticipated Outcome(s): MOD I toilet; S shower OT Recommendation Patient destination: Home Follow Up Recommendations: Outpatient OT Equipment Recommended: 3 in 1 bedside comode;Tub/shower seat   OT Evaluation Precautions/Restrictions  Precautions Precautions: Fall Precaution Comments: foot drop on RLE Restrictions Weight Bearing Restrictions:  No General Chart Reviewed: Yes Family/Caregiver Present: No Vital Signs Therapy Vitals Temp: 98.7 F (37.1 C) Pulse Rate: 97 Resp: 15 BP: 127/62 Patient Position (if appropriate): Lying Oxygen Therapy SpO2: 98 % O2 Device: Room Air Pain Pain Assessment Pain Scale: 0-10 Pain Score: Asleep Pain Type: Acute pain;Neuropathic pain Pain Location: Hand Pain Orientation: Right Pain Descriptors / Indicators: Aching;Tingling Pain Frequency: Constant Pain Onset: On-going Pain Intervention(s): Medication (See eMAR) Home Living/Prior Functioning Home Living Family/patient expects to  be discharged to:: Private residence Living Arrangements: Spouse/significant other, Children Available Help at Discharge: Family, Available PRN/intermittently Type of Home: House Home Access: Stairs to enter [employer] of Steps: 2-3 Home Layout: One level Bathroom Shower/Tub: Tub/shower unit, Gaffer, Architectural technologist: Programmer, systems: Yes  Lives With: Spouse IADL History Current License: Yes Mode of Transportation: Car Occupation: Full time employment Type of Occupation: Charity fundraiser Leisure and Hobbies: daughter age 13; loves going to her sporting events Prior Function Level of Independence: Independent with basic ADLs, Independent with homemaking with ambulation  Able to Take Stairs?: Yes Driving: Yes Vision Baseline Vision/History: No visual deficits Patient Visual Report: No change from baseline Vision Assessment?: No apparent visual deficits Perception  Perception: Impaired Inattention/Neglect: Does not attend to right side of body (mild) Praxis Praxis: Intact Cognition Overall Cognitive Status: Impaired/Different from baseline Arousal/Alertness: Awake/alert Orientation Level: Person;Place;Situation Person: Oriented Place: Oriented Situation: Oriented Year: 2021 Month: September Day of Week: Correct Immediate Memory Recall:  Sock;Blue;Bed Memory Recall Sock: Without Cue Memory Recall Blue: Without Cue Memory Recall Bed: Without Cue Attention: Sustained Sustained Attention: Appears intact Problem Solving: Impaired Sensation Sensation Light Touch: Impaired by gross assessment (no sensation on lateral aspect of RLE below the knee down to pinky toe) Coordination Gross Motor Movements are Fluid and Coordinated: No Fine Motor Movements are Fluid and Coordinated: No Coordination and Movement Description: edema impacting coordination of RUE Finger Nose Finger Test: mild atazia with RUE Motor  Motor Motor: Hemiplegia;Ataxia Motor - Skilled Clinical Observations: RLE weakness>UE  Trunk/Postural Assessment  Cervical Assessment Cervical Assessment: Exceptions to [facility] Thoracic Assessment Thoracic Assessment: Exceptions to [facility] Lumbar Assessment Lumbar Assessment: Exceptions to [facility] Postural Control Postural Control: Deficits on evaluation (delayed R)  Balance Balance Balance Assessed: Yes Dynamic Sitting Balance Dynamic Sitting - Balance Support: No upper extremity supported;Feet supported Dynamic Sitting - Level of Assistance: 5: Stand by assistance Static Standing Balance Static Standing - Balance Support: Left upper extremity supported Static Standing - Level of Assistance: 4: Min assist Extremity/Trunk Assessment RUE Assessment RUE Assessment: Exceptions to [facility] General Strength Comments: 3-/5 shoulder; 3/5 elbow and hand LUE Assessment LUE Assessment: Within Functional Limits  Care Tool Care Tool Self Care Eating        Oral Care    Oral Care Assist Level: Supervision/Verbal cueing    Bathing   Body parts bathed by patient: Right arm;Left arm;Chest;Abdomen;Front perineal area;Right upper leg;Left upper leg;Face Body parts bathed by helper: Buttocks;Right lower leg;Left lower leg   Assist Level: Moderate Assistance - Patient 50 - 74%    Upper Body Dressing(including orthotics)   What is the  patient wearing?: Pull over shirt   Assist Level: Minimal Assistance - Patient > 75% (pull down back)    Lower Body Dressing (excluding footwear)     Assist for lower body dressing: Maximal Assistance - Patient 25 - 49%    Putting on/Taking off footwear   What is the patient wearing?: Non-skid slipper socks Assist for footwear: Dependent - Patient 0%       Care Tool Toileting Toileting activity   Assist for toileting: Moderate Assistance - Patient 50 - 74%     Care Tool Bed Mobility Roll left and right activity        Sit to lying activity        Lying to sitting edge of bed activity         Care Tool Transfers Sit to stand transfer    MIN A  Chair/bed transfer    MIN A     Toilet transfer   Assist Level: Minimal Assistance - Patient > 75%     Care Tool Cognition Expression of Ideas and Wants Expression of Ideas and Wants: Without difficulty (complex and basic) - expresses complex messages without difficulty and with speech that is clear and easy to understand   Understanding Verbal and Non-Verbal Content Understanding Verbal and Non-Verbal Content: Understands (complex and basic) - clear comprehension without cues or repetitions   Memory/Recall Ability *first 3 days only Memory/Recall Ability *first 3 days only: Current season;Staff names and faces;That he or she is in a hospital/hospital unit    Refer to Care Plan for [place] 1 OT Short Term Goal 1 (Week 1): Pt will stand to groom for 2 items to show improved endurance wiht CGA OT Short Term Goal 2 (Week 1): Pt will demo edema management techniques with no VC OT Short Term Goal 3 (Week 1): Pt will don shirt wiht set up OT Short Term Goal 4 (Week 1): Pt will thread BLE into pants wiht AE PRN  Recommendations for other services: None    Skilled Therapeutic Intervention 1:1. Pt received in bed agreeable to OT. See below for sink level ADL details and above for care tool  scores. Pt with edema in scrotum, RUE and LUE greatly impacting performance of ADLs. R hemiplegia less than LE but no buckling noted suring stand stap transfers. Mild L lean in standing for CM d/t pt not trusting RLE and dizziness with change in position that subsides afer a minute. Coban wrapping applied at end of session, stress ball issued and edu re edema management strategies to complete outside of tx time. Exited session with pt seated in bed, exi tlaarm on and call ligth in reach BP supine 127/58 BP in w/c 111/63 ADL ADL Grooming: Supervision/safety Where Assessed-Grooming: Sitting at sink Upper Body Bathing: Supervision/safety Where Assessed-Upper Body Bathing: Sitting at sink Lower Body Bathing: Moderate assistance Where Assessed-Lower Body Bathing: Sitting at sink;Standing at sink Upper Body Dressing: Minimal assistance Where Assessed-Upper Body Dressing: Sitting at sink Lower Body Dressing: Maximal assistance Where Assessed-Lower Body Dressing: Sitting at sink;Standing at sink Toileting: Moderate assistance Where Assessed-Toileting: Toilet;Bedside Commode Toilet Transfer: Minimal assistance Toilet Transfer Method: Stand pivot Mobility  Transfers Sit to Stand: Minimal Assistance - Patient > 75% Stand to Sit: Minimal Assistance - Patient > 75%   Discharge Criteria: Patient will be discharged from OT if patient refuses treatment 3 consecutive times without medical reason, if treatment goals not met, if there is a change in medical status, if patient makes no progress towards goals or if patient is discharged from hospital.  The above assessment, treatment plan, treatment alternatives and goals were discussed and mutually agreed upon: by patient  Tonny Branch 11/10/2019, 7:53 AM

## 2019-11-10 NOTE — Progress Notes (Signed)
[facility] PHYSICAL MEDICINE & REHABILITATION PROGRESS NOTE   Subjective/Complaints:  Main issue today is constipation.  Patient agrees to taking a liquid laxative. No pain complaints currently.  Some tingling in the fingers but this has been very constant the last several days, no new weakness in the hand Review of systems negative for chest pain shortness of breath nausea vomiting diarrhea constipation Objective:   No results found. Recent Labs    11/09/19 0436 11/10/19 0451  WBC 12.6* 11.9*  HGB 9.1* 8.1*  HCT 27.9* 25.8*  PLT 318 315   Recent Labs    11/09/19 0436 11/10/19 0451  NA 132* 134*  K 4.2 4.0  CL 98 97*  CO2 24 25  GLUCOSE 106* 106*  BUN 74* 47*  CREATININE 10.43* 7.98*  CALCIUM 8.0* 8.4*    Intake/Output Summary (Last 24 hours) at 11/10/2019 1416 Last data filed at 11/10/2019 0700 Gross per 24 hour  Intake 480 ml  Output 0 ml  Net 480 ml        Physical Exam: Vital Signs Blood pressure 127/62, pulse 97, temperature 98.7 F (37.1 C), resp. rate 15, height 5' 11"  (1.803 m), weight 106.5 kg, SpO2 98 %.   General: No acute distress Mood and affect are appropriate Heart: Regular rate and rhythm no rubs murmurs or extra sounds Lungs: Clear to auscultation, breathing unlabored, no rales or wheezes Abdomen: Positive bowel sounds, soft nontender to palpation, nondistended Extremities: No clubbing, cyanosis, diffuse edema right upper limb and right lower limb Skin: No evidence of breakdown, no evidence of rash Neurologic: Cranial nerves II through XII intact, motor strength is 5/5 in left deltoid, bicep, tricep, grip, hip flexor, knee extensors, ankle dorsiflexor and plantar flexor 3/5 in the right deltoid bicep tricep grip, 0/5 in the right hip flexor knee extensor trace ankle dorsiflexor plantar flexor Sensory exam normal sensation to light touch  bilateral upper and left lower extremities, right lower extremity no sensation below the  ankle Musculoskeletal: Full range of motion in all 4 extremities. No joint swelling  Assessment/Plan: 1. Functional deficits secondary to anoxic strokes with rhabdomyolysis right upper and right lower limb causing weakness which require 3+ hours per day of interdisciplinary therapy in a comprehensive inpatient rehab setting.  Physiatrist is providing close team supervision and 24 hour management of active medical problems listed below.  Physiatrist and rehab team continue to assess barriers to discharge/monitor patient progress toward functional and medical goals  Care Tool:  Bathing    Body parts bathed by patient: Right arm, Left arm, Chest, Abdomen, Front perineal area, Right upper leg, Left upper leg, Face   Body parts bathed by helper: Buttocks, Right lower leg, Left lower leg     Bathing assist Assist Level: Moderate Assistance - Patient 50 - 74%     Upper Body Dressing/Undressing Upper body dressing   What is the patient wearing?: Pull over shirt    Upper body assist Assist Level: Minimal Assistance - Patient > 75% (pull down back)    Lower Body Dressing/Undressing Lower body dressing            Lower body assist Assist for lower body dressing: Maximal Assistance - Patient 25 - 49%     Toileting Toileting    Toileting assist Assist for toileting: Moderate Assistance - Patient 50 - 74%     Transfers Chair/bed transfer  Transfers assist     Chair/bed transfer assist level: Moderate Assistance - Patient 50 - 74%  Locomotion Ambulation   Ambulation assist      Assist level: Moderate Assistance - Patient 50 - 74% Assistive device: Hand held assist Max distance: 20   Walk 10 feet activity   Assist     Assist level: Moderate Assistance - Patient - 50 - 74% Assistive device: Other (comment)   Walk 50 feet activity   Assist Walk 50 feet with 2 turns activity did not occur: Safety/medical concerns         Walk 150 feet  activity   Assist Walk 150 feet activity did not occur: Safety/medical concerns         Walk 10 feet on uneven surface  activity   Assist Walk 10 feet on uneven surfaces activity did not occur: Safety/medical concerns         Wheelchair     Assist   Type of Wheelchair: Manual    Wheelchair assist level: Minimal Assistance - Patient > 75% Max wheelchair distance: 150    Wheelchair 50 feet with 2 turns activity    Assist        Assist Level: Minimal Assistance - Patient > 75%   Wheelchair 150 feet activity     Assist      Assist Level: Minimal Assistance - Patient > 75%   Blood pressure 127/62, pulse 97, temperature 98.7 F (37.1 C), resp. rate 15, height 5' 11"  (1.803 m), weight 106.5 kg, SpO2 98 %.  Medical Problem List and Plan: 1.Right hemiparesis and generalized weaknesssecondary to rhabdomyolysis with neuropraxic peripheral nerve injury. Pt also with punctate occipital lobe and cerebellar infarcts as well as globi pallidi insults----potentially d/t anoxic injury -patient may shower -ELOS/Goals: 7-10 days 2. Antithrombotics: -DVT/anticoagulation:Pharmaceutical:Heparin -antiplatelet therapy: 60m daily 3. Pain Management:D/c dilaudid--has been using 2.5 mg/day--will transition to oxycodone prn.  Patient is doing well on current medications -will add gabapentin for neuropathic pain RLE 4. Mood:LCSW to follow for evaluation and support. -antipsychotic agents: N/A 5. Neuropsych: This patientiscapable of making decisions on hisown behalf. 6. Skin/Wound Care:Routine pressure relief measures, local wound care asneeded. 7. Fluids/Electrolytes/Nutrition:Strict I/O. Check lytes in am.  8. AKI with rhabdomyolysis: CK- 2456 slowly coming down. HD dependent MWF- CLIP pending.  9. Crohn's disease: Remicade on hold.  10. Abnormal LFTs: Resolving with AST 9137-->57. Recheck LFTs  in am.  11. Polysubstance abuse: Has completed CIWA protocol.  12. RUE/RLEweakness withedema and sensory loss,neuropraxia: -PROM/AROM, edema control -outpt EMG/NCS potentially 13. Leucocytosis: Continues to fluctuate--will monitor for signs of infection.  14. Anemia:Supplement with IV iron X 3 and Aranesp ordered. 15.Hyocalcemia: Treated with multiple doses IV calcium gluconate--now on CaCO3 bid.    LOS: 1 days A FACE TO FACE EVALUATION WAS PERFORMED  ACharlett Blake9/18/2021, 2:16 PM

## 2019-11-10 NOTE — Evaluation (Signed)
Physical Therapy Assessment and Plan  Patient Details  Name: [name] MRN: [id number] Date of Birth: [birth year]  PT Diagnosis: Abnormal posture, Abnormality of gait, Ataxia, Ataxic gait, Coordination disorder, Hypertonia, Muscle weakness and Pain in joint Rehab Potential: Good ELOS: 12-14 days   Today's Date: 11/10/2019 PT Individual Time: 1105-1205 PT Individual Time Calculation (min): 60 min    Hospital Problem: Principal Problem:   Rhabdomyolysis Active Problems:   Embolic stroke ([place])   Anoxic brain injury [facility])   Past Medical History:  Past Medical History:  Diagnosis Date  . Allergy   . Crohn's colitis ([place])   . Depression   . Pulmonary embolism [facility])    Past Surgical History:  Past Surgical History:  Procedure Laterality Date  . COLOSTOMY  March 2011  . Colostomy reversal  January 2012  . HIP SURGERY  R hip surgery   . IR FLUORO GUIDE CV LINE RIGHT  10/30/2019  . IR US GUIDE VASC ACCESS RIGHT  10/30/2019    Assessment & Plan Clinical Impression: Patient is a 49 year old male with history of Crohn's, PE, depression,recent uvula surgery; who was admitted on 10/27/2019 with RUE/RLE weakness. Per reports, the night before patient was intoxicated on alcohol,took a Mollyand blacked out on friends back floor and splet on his right side till the next morning. UDS positive for cocaine and EtOH level-24. He woke up with severe pain on right side with swelling of his arm and leg, was hypotensive at admission, had acute renal failure with rhabdomyolysis. He was treated with IV fluids as well as Lokelma. MRI brain showed acute punctate infarcts in right occipital lobe and right cerebellum as well as restricted diffusion globipallidi,MRI of cervical and lumbar spine done due to weakness on the right and showed mild right and left C8/4 foraminal stenosis with mild facet arthrosis L4-S1. MRA head/neck negative for LVO. Renal ultrasound was negative for hydronephrosis and showed  normal echogenicity.   Nephrology following for input on acute renal failure and hemodialysis initiated due to poor recovery and poor urine output. Dr. Leonel Ramsay felt that weakness was peripheral in nature due to neuropraxia and rhabdomyolysis. Cerebral injury felt to be due to overdose, hypotension with prolonged downtime,question of fentanyl/cocaine combo--supportive care recommended. 2D echo showed EF of 55% with normal function. Right hip films were negative for fracture. Dr. Stann Mainland also consulted for input on edema RUE/RLE and recommended monitoring for compartment syndrome. RUE Dopplers done on 09/10 due to increasing edema and was negative for DVT or superficial thrombosis. CPK on downward trend. Hemodialysis ongoing MWFand CLIP process initiated.Normocytic anemiabeing managed with IV iron X 3 and ESA added today.Has been getting IV Dilaudid as needed for pain. Hecontinues to have limitations due to right-sided weakness with sensory deficits and higher level cognitive deficits.   Patient transferred to CIR on 11/09/2019 .   Patient currently requires mod with mobility secondary to muscle weakness, muscle joint tightness and muscle paralysis, decreased cardiorespiratoy endurance, impaired timing and sequencing, unbalanced muscle activation, motor apraxia, ataxia, decreased coordination and decreased motor planning, decreased attention to right and decreased sitting balance, decreased standing balance, decreased postural control, hemiplegia and decreased balance strategies.  Prior to hospitalization, patient was independent  with mobility and lived with Spouse in a House home.  Home access is 4-5Stairs to enter.  Patient will benefit from skilled PT intervention to maximize safe functional mobility, minimize fall risk and decrease caregiver burden for planned discharge home with intermittent assist.  Anticipate patient will benefit from  follow up [place] at discharge.  PT - End of Session Activity  Tolerance: Tolerates 30+ min activity with multiple rests PT Assessment Rehab Potential (ACUTE/IP ONLY): Good PT Barriers to Discharge: [place] home environment;Home environment access/layout;Insurance for SNF coverage PT Patient demonstrates impairments in the following area(s): Balance;Edema;Endurance;Motor;Pain;Perception;Safety;Sensory;Skin Integrity PT Transfers Functional Problem(s): Bed Mobility;Bed to Chair;Car;Furniture;Floor PT Locomotion Functional Problem(s): Wheelchair Mobility;Stairs;Ambulation PT Plan PT Intensity: Minimum of 1-2 x/day ,45 to 90 minutes PT Frequency: 5 out of 7 days PT Duration Estimated Length of Stay: 12-14 days PT Treatment/Interventions: Ambulation/gait training;Balance/vestibular training;Cognitive remediation/compensation;Community reintegration;Discharge planning;Disease management/prevention;DME/adaptive equipment instruction;Functional electrical stimulation;Functional mobility training;Neuromuscular re-education;Pain management;Patient/family education;Stair training;Splinting/orthotics;Skin care/wound management;Psychosocial support;Therapeutic Activities;Therapeutic Exercise;UE/LE Strength taining/ROM;UE/LE Coordination activities;Visual/perceptual remediation/compensation;Wheelchair propulsion/positioning PT Transfers Anticipated Outcome(s): Mod I with LRAD PT Locomotion Anticipated Outcome(s): Mod I WC mobiltiy supervisoin assist ambulation with LRAD PT Recommendation Recommendations for Other Services: Therapeutic Recreation consult Therapeutic Recreation Interventions: Stress management;Outing/community reintergration Follow Up Recommendations: Home health PT Patient destination: Home Equipment Recommended: To be determined;Wheelchair cushion (measurements);Wheelchair (measurements)   PT Evaluation Precautions/Restrictions    Fall  Pain Pain Assessment Pain Scale: 0-10 Pain Score: 7  Pain Type: Acute pain;Neuropathic pain Pain  Location: Arm Pain Orientation: Right Pain Descriptors / Indicators: Aching;Tingling;Numbness Pain Frequency: Constant Pain Onset: On-going Patients Stated Pain Goal: 4 Pain Intervention(s): RN made aware Home Living/Prior Functioning Home Living Available Help at Discharge: Family;Available PRN/intermittently Type of Home: House Home Access: Stairs to enter [employer] of Steps: 4-5 Entrance Stairs-Rails: Right;Left (can reach 1 at a time) Home Layout: One level Bathroom Shower/Tub: Tub/shower unit;Walk-in shower;Curtain Bathroom Toilet: Standard Bathroom Accessibility: Yes  Lives With: Spouse Prior Function Level of Independence: Independent with basic ADLs;Independent with homemaking with ambulation  Able to Take Stairs?: Yes Driving: Yes 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  Sensation Sensation Light Touch: Impaired Detail Light Touch Impaired Details: Impaired RUE;Impaired RLE Additional Comments: parathesia parximally and absent light touch distally in the RLE Coordination Gross Motor Movements are Fluid and Coordinated: No Fine Motor Movements are Fluid and Coordinated: No Coordination and Movement Description: edema and motor planning deficits impacting coordination of RUE/RLE Finger Nose Finger Test: mild ataxia with RUE Motor  Motor Motor: Hemiplegia;Ataxia Motor - Skilled Clinical Observations: RLE weakness>UE   Trunk/Postural Assessment  Cervical Assessment Cervical Assessment: Exceptions to [facility] Thoracic Assessment Thoracic Assessment: Exceptions to [facility] Lumbar Assessment Lumbar Assessment: Exceptions to [facility] Postural Control Postural Control: Deficits on evaluation (delayed R)  Balance Balance Balance Assessed: Yes Dynamic  Sitting Balance Dynamic Sitting - Balance Support: No upper extremity supported;Feet supported Dynamic Sitting - Level of Assistance: 5: Stand by assistance Static Standing Balance Static Standing - Balance Support: Left upper extremity supported Static Standing - Level of Assistance: 4: Min assist Dynamic Standing Balance Dynamic Standing - Balance Support: Right upper extremity supported;During functional activity Dynamic Standing - Level of Assistance: 3: Mod assist Extremity Assessment      RLE Assessment RLE Assessment: Exceptions to [facility] RLE Strength Right Hip Flexion: 3+/5 Right Hip Extension: 3+/5 Right Hip ABduction: 4-/5 Right Hip ADduction: 2-/5 Right Knee Flexion: 1/5 Right Knee Extension: 4-/5 Right Ankle Dorsiflexion: 0/5 Right Ankle Plantar Flexion: 0/5 LLE Assessment LLE Assessment: Within Functional Limits General Strength Comments: grossly 5/5 proximal to distal  Care Tool Care Tool Bed Mobility Roll left and right activity   Roll left  and right assist level: Minimal Assistance - Patient > 75%    Sit to lying activity   Sit to lying assist level: Minimal Assistance - Patient > 75%    Lying to sitting edge of bed activity   Lying to sitting edge of bed assist level: Minimal Assistance - Patient > 75%     Care Tool Transfers Sit to stand transfer   Sit to stand assist level: Moderate Assistance - Patient 50 - 74%    Chair/bed transfer   Chair/bed transfer assist level: Moderate Assistance - Patient 50 - 74%     Toilet transfer   Assist Level: Moderate Assistance - Patient 50 - 74%    Car transfer   Car transfer assist level: Moderate Assistance - Patient 50 - 74%      Care Tool Locomotion Ambulation   Assist level: Moderate Assistance - Patient 50 - 74% Assistive device: Hand held assist Max distance: 20  Walk 10 feet activity   Assist level: Moderate Assistance - Patient - 50 - 74% Assistive device: Other (comment)   Walk 50 feet with 2  turns activity Walk 50 feet with 2 turns activity did not occur: Safety/medical concerns      Walk 150 feet activity Walk 150 feet activity did not occur: Safety/medical concerns      Walk 10 feet on uneven surfaces activity Walk 10 feet on uneven surfaces activity did not occur: Safety/medical concerns      Stairs Stair activity did not occur: Safety/medical concerns        Walk up/down 1 step activity Walk up/down 1 step or curb (drop down) activity did not occur: Safety/medical concerns     Walk up/down 4 steps activity did not occuR: Safety/medical concerns  Walk up/down 4 steps activity      Walk up/down 12 steps activity Walk up/down 12 steps activity did not occur: Safety/medical concerns      Pick up small objects from floor Pick up small object from the floor (from standing position) activity did not occur: Safety/medical concerns      Wheelchair   Type of Wheelchair: Manual   Wheelchair assist level: Minimal Assistance - Patient > 75% Max wheelchair distance: 150  Wheel 50 feet with 2 turns activity   Assist Level: Minimal Assistance - Patient > 75%  Wheel 150 feet activity   Assist Level: Minimal Assistance - Patient > 75%    Refer to Care Plan for Long Term Goals  SHORT TERM GOAL WEEK 1 PT Short Term Goal 1 (Week 1): Pt will perfom stand pivot transfer with min assist consistently. PT Short Term Goal 2 (Week 1): Pt will ambulate 117f with RW and min assist PT Short Term Goal 3 (Week 1): Pt will ascend/descend 4 steps with min assist and BUE support  Recommendations for other services: Therapeutic Recreation  Stress management and Outing/community reintegration  Skilled Therapeutic Intervention  Pt received supine in bed and agreeable to PT. Supine>sit transfer with min assist as listed. PT instructed patient in PT Evaluation and initiated treatment intervention; see below for results. PT educated [place] in [place]hab potential, rehab goals, and discharge  recommendations. Gait training performed with and without AD as listed as well as stair management with BUE support. Car transfer performed without AD and mod assist for safety. Pt performed 5xSTS: 30 sec (>15 sec indicates increased fall risk) Pt returned to room and performed stand pivot transfer to bed with RW and Min assist. Sit>supine completed with min  assist for RLE management. Pt left supine in bed with call bell in reach and all needs met.      Mobility Bed Mobility Bed Mobility: Rolling Right;Rolling Left;Sit to Supine;Supine to Sit Rolling Right: Minimal Assistance - Patient > 75% Rolling Left: Minimal Assistance - Patient > 75% Supine to Sit: Minimal Assistance - Patient > 75% Sit to Supine: Minimal Assistance - Patient > 75% Transfers Transfers: Sit to Stand;Stand to Sit;Stand Pivot Transfers Sit to Stand: Minimal Assistance - Patient > 75% Stand to Sit: Minimal Assistance - Patient > 75% Stand Pivot Transfers: Minimal Assistance - Patient > 75% Stand Pivot Transfer Details: Verbal cues for safe use of DME/AE;Verbal cues for gait pattern;Manual facilitation for weight shifting;Verbal cues for precautions/safety Transfer (Assistive device): Rolling walker Locomotion  Gait Ambulation: Yes Gait Assistance: Minimal Assistance - Patient > 75% Gait Distance (Feet): 20 Feet Assistive device: Rolling walker Gait Assistance Details: Verbal cues for gait pattern;Verbal cues for safe use of DME/AE;Manual facilitation for weight shifting;Verbal cues for precautions/safety;Manual facilitation for placement Gait Gait: Yes Gait Pattern: Impaired Gait Pattern: Step-to pattern;Abducted- right;Wide base of support;Lateral hip instability;Right foot flat;Poor foot clearance - right Stairs / Additional Locomotion Stairs: Yes Stairs Assistance: Moderate Assistance - Patient 50 - 74% Stair Management Technique: Two rails Number of Stairs: 4 Height of Stairs: 6 Wheelchair  Mobility Wheelchair Mobility: Yes Wheelchair Assistance: Minimal assistance - Patient >75% Wheelchair Propulsion: Both upper extremities Wheelchair Parts Management: Needs assistance Distance: 150   Discharge Criteria: Patient will be discharged from PT if patient refuses treatment 3 consecutive times without medical reason, if treatment goals not met, if there is a change in medical status, if patient makes no progress towards goals or if patient is discharged from hospital.  The above assessment, treatment plan, treatment alternatives and goals were discussed and mutually agreed upon: by patient  Lorie Phenix 11/10/2019, 1:04 PM

## 2019-11-10 NOTE — Plan of Care (Signed)
  Problem: RH Balance Goal: LTG Patient will maintain dynamic sitting balance (PT) Description: LTG:  Patient will maintain dynamic sitting balance with assistance during mobility activities (PT) Flowsheets (Taken 11/10/2019 1300) LTG: Pt will maintain dynamic sitting balance during mobility activities with:: Independent Goal: LTG Patient will maintain dynamic standing balance (PT) Description: LTG:  Patient will maintain dynamic standing balance with assistance during mobility activities (PT) Flowsheets (Taken 11/10/2019 1300) LTG: Pt will maintain dynamic standing balance during mobility activities with:: Independent with assistive device    Problem: RH Bed Mobility Goal: LTG Patient will perform bed mobility with assist (PT) Description: LTG: Patient will perform bed mobility with assistance, with/without cues (PT). Flowsheets (Taken 11/10/2019 1300) LTG: Pt will perform bed mobility with assistance level of: Independent with assistive device    Problem: RH Bed to Chair Transfers Goal: LTG Patient will perform bed/chair transfers w/assist (PT) Description: LTG: Patient will perform bed to chair transfers with assistance (PT). Flowsheets (Taken 11/10/2019 1300) LTG: Pt will perform Bed to Chair Transfers with assistance level: Independent with assistive device    Problem: RH Car Transfers Goal: LTG Patient will perform car transfers with assist (PT) Description: LTG: Patient will perform car transfers with assistance (PT). Flowsheets (Taken 11/10/2019 1300) LTG: Pt will perform car transfers with assist:: Supervision/Verbal cueing   Problem: RH Furniture Transfers Goal: LTG Patient will perform furniture transfers w/assist (OT/PT) Description: LTG: Patient will perform furniture transfers  with assistance (OT/PT). Flowsheets (Taken 11/10/2019 1300) LTG: Pt will perform furniture transfers with assist:: Supervision/Verbal cueing   Problem: RH Ambulation Goal: LTG Patient will ambulate  in controlled environment (PT) Description: LTG: Patient will ambulate in a controlled environment, # of feet with assistance (PT). Flowsheets (Taken 11/10/2019 1300) LTG: Pt will ambulate in controlled environ  assist needed:: Supervision/Verbal cueing LTG: Ambulation distance in controlled environment: 163f with LRAD Goal: LTG Patient will ambulate in home environment (PT) Description: LTG: Patient will ambulate in home environment, # of feet with assistance (PT). Flowsheets (Taken 11/10/2019 1300) LTG: Pt will ambulate in home environ  assist needed:: Supervision/Verbal cueing LTG: Ambulation distance in home environment: 552fwith LRAD   Problem: RH Wheelchair Mobility Goal: LTG Patient will propel w/c in controlled environment (PT) Description: LTG: Patient will propel wheelchair in controlled environment, # of feet with assist (PT) Flowsheets (Taken 11/10/2019 1300) LTG: Pt will propel w/c in controlled environ  assist needed:: Independent with assistive device LTG: Propel w/c distance in controlled environment: 1504foal: LTG Patient will propel w/c in home environment (PT) Description: LTG: Patient will propel wheelchair in home environment, # of feet with assistance (PT). Flowsheets (Taken 11/10/2019 1300) LTG: Pt will propel w/c in home environ  assist needed:: Independent with assistive device LTG: Propel w/c distance in home environment: [id number]: RH Stairs Goal: LTG Patient will ambulate up and down stairs w/assist (PT) Description: LTG: Patient will ambulate up and down # of stairs with assistance (PT) Flowsheets (Taken 11/10/2019 1300) LTG: Pt will ambulate up/down stairs assist needed:: Supervision/Verbal cueing LTG: Pt will  ambulate up and down number of stairs: 5 steps with 1 rail to enter house

## 2019-11-10 NOTE — Progress Notes (Signed)
Physical Therapy Session Note  Patient Details  Name: [name] MRN: [id number] Date of Birth: 10-03-1970  Today's Date: 11/10/2019 PT Individual Time: 9629-5284 PT Individual Time Calculation (min): 28 min    Short Term Goals: Week 1:  PT Short Term Goal 1 (Week 1): Pt will perfom stand pivot transfer with min assist consistently. PT Short Term Goal 2 (Week 1): Pt will ambulate 15f with RW and min assist PT Short Term Goal 3 (Week 1): Pt will ascend/descend 4 steps with min assist and BUE support  Skilled Therapeutic Interventions/Progress Updates:   Pt received supine in bed and agreeable to PT. Supine>sit transfer with min assist at trunk. Sit<>stand from EOB with min assist and RW. Ambulatory transfer to arm chair with RW and min assist x 10 ft with step to gait pattern.   PT instructed pt in TUG: 46 sec (average of 2 trials; >13.5 sec indicates increased fall risk)  Patient demonstrates increased fall risk as noted by score of   23/56 on Berg Balance Scale.  (<36= high risk for falls, close to 100%; 37-45 significant >80%; 46-51 moderate >50%; 52-55 lower >25%)  Pt returned to room and performed ambulatory transfer to bed with min assist and RW. Sit>supine completed with min assist for RLE control, and left supine in bed with call bell in reach and all needs met.        Therapy Documentation Precautions:  Precautions Precautions: Fall Precaution Comments: foot drop on RLE Restrictions Weight Bearing Restrictions: No    Vital Signs: Therapy Vitals Temp: 98.5 F (36.9 C) Pulse Rate: 97 Resp: 16 BP: 123/64 Patient Position (if appropriate): Sitting Oxygen Therapy SpO2: 97 % O2 Device: Room Air Pain: 7/10 R UE, numb, tingling. RN aware    Trunk/Postural Assessment : Cervical Assessment Cervical Assessment: Exceptions to [facility]or[facility] Assessment Thoracic Assessment: Exceptions to [facility] Assessment Lumbar Assessment: Exceptions to [facility]St. [facility] CenterPostural  Control Postural Control: Deficits on evaluation (delayed R)  Balance: Balance Balance Assessed: Yes Standardized Balance Assessment Standardized Balance Assessment: Timed Up and Go Test;Berg Balance Test Berg Balance Test Sit to Stand: Able to stand  independently using hands Standing Unsupported: Able to stand 2 minutes with supervision Sitting with Back Unsupported but Feet Supported on Floor or Stool: Able to sit safely and securely 2 minutes Stand to Sit: Controls descent by using hands Transfers: Needs one person to assist Standing Unsupported with Eyes Closed: Able to stand 3 seconds Standing Ubsupported with Feet Together: Needs help to attain position but able to stand for 30 seconds with feet together From Standing, Reach Forward with Outstretched Arm: Can reach forward >12 cm safely (5") From Standing Position, Pick up Object from Floor: Unable to try/needs assist to keep balance From Standing Position, Turn to Look Behind Over each Shoulder: Needs supervision when turning Turn 360 Degrees: Needs assistance while turning Standing Unsupported, Alternately Place Feet on Step/Stool: Able to complete >2 steps/needs minimal assist Standing Unsupported, One Foot in Front: Needs help to step but can hold 15 seconds Standing on One Leg: Unable to try or needs assist to prevent fall Total Score: 23 Timed Up and Go Test TUG: Normal TUG Normal TUG (seconds): 46 (51 and 41) Dynamic Sitting Balance Dynamic Sitting - Balance Support: No upper extremity supported;Feet supported Dynamic Sitting - Level of Assistance: 5: Stand by assistance Static Standing Balance Static Standing - Balance Support: Left upper extremity supported Static Standing - Level of Assistance: 4: Min assist Dynamic Standing Balance Dynamic  Standing - Balance Support: Right upper extremity supported;During functional activity Dynamic Standing - Level of Assistance: 3: Mod assist   Therapy/Group: Individual  Therapy  Lorie Phenix 11/10/2019, 4:34 PM

## 2019-11-10 NOTE — Progress Notes (Signed)
Occupational Therapy Session Note  Patient Details  Name: [name] MRN: [id number] Date of Birth: Jul 02, 1970  Today's Date: 11/10/2019 OT Individual Time: 1300-1345 OT Individual Time Calculation (min): 45 min    Short Term Goals: Week 1:  OT Short Term Goal 1 (Week 1): Pt will stand to groom for 2 items to show improved endurance wiht CGA OT Short Term Goal 2 (Week 1): Pt will demo edema management techniques with no VC OT Short Term Goal 3 (Week 1): Pt will don shirt wiht set up OT Short Term Goal 4 (Week 1): Pt will thread BLE into pants wiht AE PRN  Skilled Therapeutic Interventions/Progress Updates:    1;1. Pt received in bed agreeable to OT. Pt completes SPT with RW and MIN A and gaurding R knee to prevent buckling. Pt completes BUE assessment as folloes  9HPT  RUE 36 sec LUE 26 sec  dynemometer RUE 47# LUE 75  Dynavision standing MIN A with RW 1 min- decreased weight shifting R when reaching RUE 1.6 sec avg reaction time LUE 1.2  Supine therex on mat for UE elevation, strengthening and coordination required for BADL with 8# dowel rod and VC for slow contraction/demo 2x12  RUE limb wrapping to mid humerus for edema management. Pt wheezing at end of session RN alerted pt back in bed  Therapy Documentation Precautions:  Precautions Precautions: Fall Precaution Comments: foot drop on RLE Restrictions Weight Bearing Restrictions: No General:   Vital Signs:   Pain: Pain Assessment Pain Scale: 0-10 Pain Score: 7  Pain Type: Acute pain;Neuropathic pain Pain Location: Arm Pain Orientation: Right Pain Descriptors / Indicators: Aching;Tingling;Numbness Pain Frequency: Constant Pain Onset: On-going Patients Stated Pain Goal: 4 Pain Intervention(s): RN made aware ADL: ADL Grooming: Supervision/safety Where Assessed-Grooming: Sitting at sink Upper Body Bathing: Supervision/safety Where Assessed-Upper Body Bathing: Sitting at sink Lower Body Bathing:  Moderate assistance Where Assessed-Lower Body Bathing: Sitting at sink, Standing at sink Upper Body Dressing: Minimal assistance Where Assessed-Upper Body Dressing: Sitting at sink Lower Body Dressing: Maximal assistance Where Assessed-Lower Body Dressing: Sitting at sink, Standing at sink Toileting: Moderate assistance Where Assessed-Toileting: Toilet, Bedside Commode Toilet Transfer: Minimal assistance Toilet Transfer Method: Stand pivot Vision Baseline Vision/History: No visual deficits Patient Visual Report: No change from baseline Vision Assessment?: No apparent visual deficits Perception  Perception: Impaired Inattention/Neglect: Does not attend to right side of body (mild) Praxis Praxis: Intact Exercises:   Other Treatments:     Therapy/Group: Individual Therapy  Tonny Branch 11/10/2019, 1:00 PM

## 2019-11-10 NOTE — Plan of Care (Signed)
  Problem: Consults Goal: RH STROKE PATIENT EDUCATION Description: See Patient Education module for education specifics  Outcome: Progressing   Problem: RH BLADDER ELIMINATION Goal: RH STG MANAGE BLADDER WITH ASSISTANCE Description: STG Manage Bladder With Mod I Assistance Outcome: Progressing   Problem: RH SKIN INTEGRITY Goal: RH STG MAINTAIN SKIN INTEGRITY WITH ASSISTANCE Description: STG Maintain Skin Integrity With Mod I Assistance. Outcome: Progressing Goal: RH STG ABLE TO PERFORM INCISION/WOUND CARE W/ASSISTANCE Description: STG Able To Perform Incision/Wound Care With Mod I Assistance. Outcome: Progressing   Problem: RH SAFETY Goal: RH STG ADHERE TO SAFETY PRECAUTIONS W/ASSISTANCE/DEVICE Description: STG Adhere to Safety Precautions With Mod I Assistance and appropriate assistive Device. Outcome: Progressing   Problem: RH PAIN MANAGEMENT Goal: RH STG PAIN MANAGED AT OR BELOW PT'S PAIN GOAL Description: <3 on a 0-10 pain scale. Outcome: Progressing   Problem: RH BOWEL ELIMINATION Goal: RH STG MANAGE BOWEL WITH ASSISTANCE Description: STG Manage Bowel with Mod I Assistance. Outcome: Not Progressing Goal: RH STG MANAGE BOWEL W/MEDICATION W/ASSISTANCE Description: STG Manage Bowel with Medication with Mod I Assistance. Outcome: Not Progressing

## 2019-11-10 NOTE — Progress Notes (Signed)
Slept good. PRN tylenol given at 2201 & 0509, for complaint of pain and tingling in right hand and RLE. Edema observed to scrotum, Scrotal support in place. Edema to RUE and RLE. Abd. Distended, hypoactive bowel sounds X 4. Took scheduled senna S, will offer PRN miralax. Right PRAFO in place. No void this shift. HD cath to right upper chest. [name] A

## 2019-11-11 MED ORDER — BISACODYL 5 MG PO TBEC
10.0000 mg | DELAYED_RELEASE_TABLET | Freq: Once | ORAL | Status: AC
  Administered 2019-11-11: 10 mg via ORAL
  Filled 2019-11-11: qty 2

## 2019-11-11 MED ORDER — CHLORHEXIDINE GLUCONATE CLOTH 2 % EX PADS
6.0000 | MEDICATED_PAD | Freq: Every day | CUTANEOUS | Status: DC
  Administered 2019-11-12 – 2019-11-21 (×9): 6 via TOPICAL

## 2019-11-11 NOTE — Evaluation (Signed)
Speech Language Pathology Assessment and Plan  Patient Details  Name: [name] MRN: [id number] Date of Birth: 1970-12-24  SLP Diagnosis: Cognitive Impairments  Rehab Potential: Good ELOS: 10-14 days    Today's Date: 11/11/2019 SLP Individual Time: 8841-6606 SLP Individual Time Calculation (min): 17 min   Hospital Problem: Principal Problem:   Rhabdomyolysis Active Problems:   Embolic stroke ([place])   Anoxic brain injury [facility])  Past Medical History:  Past Medical History:  Diagnosis Date  . Allergy   . Crohn's colitis ([place])   . Depression   . Pulmonary embolism [facility])    Past Surgical History:  Past Surgical History:  Procedure Laterality Date  . COLOSTOMY  March 2011  . Colostomy reversal  January 2012  . HIP SURGERY  R hip surgery   . IR FLUORO GUIDE CV LINE RIGHT  10/30/2019  . IR US GUIDE VASC ACCESS RIGHT  10/30/2019    Assessment / Plan / Recommendation Clinical Impression   SANJIV CASTORENA is a5 year old male with history of Crohn's, PE, depression,recent uvula surgery; who was admitted on 10/27/2019 with RUE/RLE weakness. Per reports, the night before patient was intoxicated on alcohol,took a Mollyand blacked out on friends break flow and rate on his right side till the next morning. UDS positive for cocaine and EtOH level-24. He woke up with severe pain on right side with swelling of his arm and leg, was hypotensive at admission, had acute renal failure with rhabdomyolysis. He was treated with IV fluids as well as Lokelma. MRI brain showed acute punctate infarcts in right occipital lobe and right cerebellum as well as restricted diffusion globipallidi,MRI of cervical and lumbar spine done due to weakness on the right and showed mild right and left C8/4 foraminal stenosis with mild facet arthrosis L4-S1. MRA head/neck negative for LVO. Renal ultrasound was negative for hydronephrosis and showed normal echogenicity.   Nephrology following for input on acute renal  failure and hemodialysis initiated due to poor recovery and poor urine output. Dr. Leonel Ramsay felt that weakness was peripheral in nature due to neuropraxia and rhabdomyolysis. Cerebral injury felt to be due to overdose, hypotension with prolonged downtime,question of fentanyl/cocaine combo--supportive care recommended. 2D echo showed EF of 55% with normal function. Right hip films were negative for fracture. Dr. Stann Mainland also consulted for input on edema RUE/RLE and recommended monitoring for compartment syndrome. RUE Dopplers done on 09/10 due to increasing edema and was negative for DVT or superficial thrombosis. CPK on downward trend. Hemodialysis ongoing MWFand CLIP process initiated.Normocytic anemiabeing managed with IV iron X 3 and ESA added today.Has been getting IV Dilaudid as needed for pain. Hecontinues to have limitations due to right-sided weakness with sensory deficits and higher level cognitive deficits. CIR recommended due to functional decline.  SLP evaluation was completed on 11/11/19 with results as follows: Pt presents with mild higher level deficits which he reports to be improved from previous ST evaluation prior to CIR admission.  Pt scored WFL on all administered subtests of the Cognistat standardized cognitive assessment but presents with slightly slowed processing which could lead to increased challenges with higher level attention, problem solving, and memory during IADLs.  As a result, pt could benefit from skilled ST while inpatient in order to maximize functional independence and reduce burden of care prior to discharge.  At this time I do not anticipate ST needs post discharge; however, recommendations may be updated pending progress made while inpatient.  Skilled Therapeutic Interventions  Cognitive-linguistic evaluation completed with results and recommendations reviewed with patient.     SLP Assessment  Patient will need skilled [facility]  during CIR admission    Recommendations  Patient destination: Home Follow up Recommendations: None Equipment Recommended: None recommended by SLP    SLP Frequency 1 to 3 out of 7 days   SLP Duration  SLP Intensity  SLP Treatment/Interventions 10-14 days  Minumum of 1-2 x/day, 30 to 90 minutes  Cognitive remediation/compensation;Cueing hierarchy;Functional tasks;Patient/family education;Environmental controls;Internal/external aids    Pain Pain Assessment Pain Scale: 0-10 Pain Score: 0-No pain   Prior Functioning Cognitive/Linguistic Baseline: Within functional limits Type of Home: House  Lives With: Spouse Available Help at Discharge: Family;Available PRN/intermittently Vocation: Full time employment  SLP Evaluation Cognition Overall Cognitive Status: Impaired/Different from baseline Arousal/Alertness: Awake/alert Orientation Level: Oriented X4 Attention: Sustained Sustained Attention: Appears intact Memory: Appears intact Awareness: Appears intact Problem Solving: Appears intact Safety/Judgment: Appears intact  Comprehension Auditory Comprehension Overall Auditory Comprehension: Appears within functional limits for tasks assessed Expression Expression Primary Mode of Expression: Verbal Verbal Expression Overall Verbal Expression: Appears within functional limits for tasks assessed Oral Motor Oral Motor/Sensory Function Overall Oral Motor/Sensory Function: Within functional limits Motor Speech Overall Motor Speech: Appears within functional limits for tasks assessed  Care Tool Care Tool Cognition Expression of Ideas and Wants Expression of Ideas and Wants: Without difficulty (complex and basic) - expresses complex messages without difficulty and with speech that is clear and easy to understand   Understanding Verbal and Non-Verbal Content Understanding Verbal and Non-Verbal Content: Understands (complex and basic) - clear comprehension without cues or  repetitions   Memory/Recall Ability *first 3 days only Memory/Recall Ability *first 3 days only: Current season;Location of own room;Staff names and faces;That he or she is in a hospital/hospital unit        Short Term Goals: Week 1: SLP Short Term Goal 1 (Week 1): Pt will complete complex tasks with mod I functional problem solving. SLP Short Term Goal 2 (Week 1): Pt will recall new, complex information with mod I use of compensatory strategies. SLP Short Term Goal 3 (Week 1): Pt will alternate his attention between 2 mildly complex tasks with mod I.  Refer to Care Plan for Long Term Goals  Recommendations for other services: Neuropsych  Discharge Criteria: Patient will be discharged from SLP if patient refuses treatment 3 consecutive times without medical reason, if treatment goals not met, if there is a change in medical status, if patient makes no progress towards goals or if patient is discharged from hospital.  The above assessment, treatment plan, treatment alternatives and goals were discussed and mutually agreed upon: by patient  Emilio Math 11/11/2019, 12:23 PM

## 2019-11-11 NOTE — Progress Notes (Addendum)
[place] PHYSICAL MEDICINE & REHABILITATION PROGRESS NOTE   Subjective/Complaints:  Main issue today is constipation. No results with sorbitol x 2, now agrees to trying something stronger No abd pain Review of systems negative for chest pain shortness of breath nausea vomiting diarrhea  Objective:   No results found. Recent Labs    11/09/19 0436 11/10/19 0451  WBC 12.6* 11.9*  HGB 9.1* 8.1*  HCT 27.9* 25.8*  PLT 318 315   Recent Labs    11/09/19 0436 11/10/19 0451  NA 132* 134*  K 4.2 4.0  CL 98 97*  CO2 24 25  GLUCOSE 106* 106*  BUN 74* 47*  CREATININE 10.43* 7.98*  CALCIUM 8.0* 8.4*    Intake/Output Summary (Last 24 hours) at 11/11/2019 1233 Last data filed at 11/10/2019 2300 Gross per 24 hour  Intake 775 ml  Output 350 ml  Net 425 ml        Physical Exam: Vital Signs Blood pressure 129/69, pulse (!) 106, temperature 98.4 F (36.9 C), resp. rate 16, height 5' 11"  (1.803 m), weight 105.3 kg, SpO2 95 %.  General: No acute distress Mood and affect are appropriate Heart: Regular rate and rhythm no rubs murmurs or extra sounds Lungs: Clear to auscultation, breathing unlabored, no rales or wheezes Abdomen: Positive bowel sounds, soft nontender to palpation, nondistended Extremities: diffuse RUE and LLE swelling trace pitting , motor strength is 5/5 in left deltoid, bicep, tricep, grip, hip flexor, knee extensors, ankle dorsiflexor and plantar flexor 3/5 in the right deltoid bicep tricep grip, 0/5 in the right hip flexor knee extensor trace ankle dorsiflexor plantar flexor Sensory exam normal sensation to light touch  bilateral upper and left lower extremities, right lower extremity no sensation below the ankle Musculoskeletal: Full range of motion in all 4 extremities. No joint swelling  Assessment/Plan: 1. Functional deficits secondary to anoxic strokes with rhabdomyolysis right upper and right lower limb causing weakness which require 3+ hours per day of  interdisciplinary therapy in a comprehensive inpatient rehab setting.  Physiatrist is providing close team supervision and 24 hour management of active medical problems listed below.  Physiatrist and rehab team continue to assess barriers to discharge/monitor patient progress toward functional and medical goals  Care Tool:  Bathing    Body parts bathed by patient: Right arm, Left arm, Chest, Abdomen, Front perineal area, Right upper leg, Left upper leg, Face   Body parts bathed by helper: Buttocks, Right lower leg, Left lower leg     Bathing assist Assist Level: Moderate Assistance - Patient 50 - 74%     Upper Body Dressing/Undressing Upper body dressing   What is the patient wearing?: Pull over shirt    Upper body assist Assist Level: Minimal Assistance - Patient > 75% (pull down back)    Lower Body Dressing/Undressing Lower body dressing            Lower body assist Assist for lower body dressing: Maximal Assistance - Patient 25 - 49%     Toileting Toileting    Toileting assist Assist for toileting: Moderate Assistance - Patient 50 - 74%     Transfers Chair/bed transfer  Transfers assist     Chair/bed transfer assist level: Moderate Assistance - Patient 50 - 74%     Locomotion Ambulation   Ambulation assist      Assist level: Moderate Assistance - Patient 50 - 74% Assistive device: Hand held assist Max distance: 20   Walk 10 feet activity   Assist  Assist level: Moderate Assistance - Patient - 50 - 74% Assistive device: Other (comment)   Walk 50 feet activity   Assist Walk 50 feet with 2 turns activity did not occur: Safety/medical concerns         Walk 150 feet activity   Assist Walk 150 feet activity did not occur: Safety/medical concerns         Walk 10 feet on uneven surface  activity   Assist Walk 10 feet on uneven surfaces activity did not occur: Safety/medical concerns         Wheelchair     Assist    Type of Wheelchair: Manual    Wheelchair assist level: Minimal Assistance - Patient > 75% Max wheelchair distance: 150    Wheelchair 50 feet with 2 turns activity    Assist        Assist Level: Minimal Assistance - Patient > 75%   Wheelchair 150 feet activity     Assist      Assist Level: Minimal Assistance - Patient > 75%   Blood pressure 129/69, pulse (!) 106, temperature 98.4 F (36.9 C), resp. rate 16, height 5' 11"  (1.803 m), weight 105.3 kg, SpO2 95 %.  Medical Problem List and Plan: 1.Right hemiparesis and generalized weaknesssecondary to rhabdomyolysis with neuropraxic peripheral nerve injury. Pt also with punctate occipital lobe and cerebellar infarcts as well as globi pallidi insults----potentially d/t anoxic injury -patient may shower -ELOS/Goals: 7-10 days 2. Antithrombotics: -DVT/anticoagulation:Pharmaceutical:Heparin -antiplatelet therapy: 40m daily 3. Pain Management:D/c dilaudid--has been using 2.5 mg/day--will transition to oxycodone prn.  Patient is doing well on current medications -will add gabapentin for neuropathic pain RLE 4. Mood:LCSW to follow for evaluation and support. -antipsychotic agents: N/A 5. Neuropsych: This patientiscapable of making decisions on hisown behalf. 6. Skin/Wound Care:Routine pressure relief measures, local wound care asneeded. 7. Fluids/Electrolytes/Nutrition:Strict I/O. Check lytes in am.  8. AKI with rhabdomyolysis: CK- 2456 slowly coming down. HD dependent MWF- CLIP pending.  9. Crohn's disease: Remicade on hold.  10. Abnormal LFTs: Resolving with AST 9137-->57. Recheck LFTs in am.  11. Polysubstance abuse: Has completed CIWA protocol.  12. RUE/RLEweakness withedema and sensory loss,neuropraxia: -PROM/AROM, edema control -outpt EMG/NCS potentially 13. Leucocytosis: Continues to fluctuate--will monitor for  signs of infection.  14. Anemia:Supplement with IV iron X 3 and Aranesp ordered. 15.Hyocalcemia: Treated with multiple doses IV calcium gluconate--now on CaCO3 bid.  16.  Constipation try Dulcolax tabs then Milk of molasses enema  LOS: 2 days A FACE TO FACE EVALUATION WAS PERFORMED  ACharlett Blake9/19/2021, 12:33 PM

## 2019-11-11 NOTE — Progress Notes (Signed)
[facility] Progress Note  Subjective:   Now in CIR.  No new issues. I/Os yest 775 / 350  Review of systems:   Denies shortness of breath or chest pain  Denies n/v    Vitals:   11/10/19 0501 11/10/19 1416 11/10/19 1937 11/11/19 0307  BP: 127/62 123/64 121/65 129/69  Pulse: 97 97 (!) 103 (!) 106  Resp: 15 16 18 16   Temp: 98.7 F (37.1 C) 98.5 F (36.9 C) 98.7 F (37.1 C) 98.4 F (36.9 C)  TempSrc:      SpO2: 98% 97% 97% 95%  Weight: 106.5 kg   105.3 kg  Height:        Exam:    Gen adult male in bed in NAD, eating full lunch   Chest clear bilat to bases; unlabored  Heart S1S2 no rub  Abd soft ntnd Ext RLE 1+ edema ; no left leg edema; LUE 2+ edema - unchanged to sl improved per his report Neuro is alert, Ox 3 provides hx and follows commands Psych no anxiety or agitation  ACCESS: R IJ TDC    Home meds:  - remicade 400 IV every 8 wks  - ciagra 50 prn/ claritin 10 prn  - prn's/ vitamins/ supplements     UDS + cocaine     CT abd 9/04 > Adrenals/Urinary Tract: Both adrenal glands appear normal. The kidneys and collecting system appear normal without evidence of urinary tract calculus or hydronephrosis. Bladder is unremarkable. Otherwise >> IMPRESSION: small portion of transverse colon and fat containing anterior umbilical hernia, however no definite evidence bowel obstruction or strangulation. Moderate amount of colonic stool. Patchy streaky airspace opacity at the right lung base which may be due to atelectasis and/or infectious etiology.     MRI brain 9/04 > IMPRESSION: 1. Symmetric restricted diffusion in the globi pallidi. This is most often seen with an acute toxic or metabolic insult (including carbon monoxide poisoning and drugs of abuse) or hypoxic-ischemic injury. No hemorrhage. 2. Punctate acute infarcts in the R occipital lobe and R cerebellum.    CXR 10/27/19 - IMPRESSION: Low lung volumes with bibasilar atelectasis. No evidence of acute traumatic  injury         Assessment/ Plan: 1. AKI - suspect rhabdomyolysis w/ swollen R leg/ arm, AKI and high K+ in pt who was unconscious on the ground for too long of a time. CPK > 50K.  AKI due to hypotension/ hypovolemia and rhabdomyolysis.  S/p TDC, HD #1 10/31/19.  HD #2 9/10 then HD #3 9/11 for volume and clearance - HD per MWF schedule this week, next planned Mon - No signs of recovery yet - per RN perhaps having some retention so doing q8h PVR; bladder scan this AM with 80m - spoke with renal navigator to initiate CLIP process to start looking for a spot at an outpatient unit as an AKI - we are still monitoring inpatient for renal recovery at this time  2. Rhabdomyolysis: d/t being unconscious/found down.  CK > 50,000.   3. Hypocalcemia: resolved  4. Hyperkalemia - K+ 7's on admit.  Improved with HD.  Off lokelma 5. Drug abuse  - counseled re: importance of cessation  6. H/o Crohn's - hx colostomy 2011, reversal in 2012, on Remicaide 7. Hyperphosphatemia - improving with HD.  Have added calcium acetate with meals.  intact PTH 139 8. Anemia normocytic - some contribution from renal failure. IV iron x 3 doses with HD. aranesp 40 mcg once on  9/17   Recent Labs  Lab 11/06/19 0532 11/06/19 0532 11/07/19 0429 11/08/19 0608 11/09/19 0436 11/10/19 0451  K 4.1   < > 4.1   < > 4.2 4.0  BUN 63*   < > 85*   < > 74* 47*  CREATININE 8.73*   < > 10.88*   < > 10.43* 7.98*  CALCIUM 7.3*   < > 7.5*   < > 8.0* 8.4*  PHOS 6.6*  --  7.8*  --   --   --   HGB 9.6*   < > 9.5*   < > 9.1* 8.1*   < > = values in this interval not displayed.   Inpatient medications: . aspirin EC  81 mg Oral Daily  . calcium acetate  667 mg Oral TID WC  . calcium carbonate  400 mg of elemental calcium Oral BID  . Chlorhexidine Gluconate Cloth  6 each Topical Daily  . gabapentin  100 mg Oral TID  . heparin  5,000 Units Subcutaneous Q8H  . senna-docusate  1 tablet Oral QHS   . [START ON 11/12/2019] ferric gluconate  (FERRLECIT/NULECIT) IV     acetaminophen, bisacodyl, diphenhydrAMINE, guaiFENesin-dextromethorphan, heparin, lidocaine (PF), lidocaine-prilocaine, milk and molasses, oxyCODONE, pentafluoroprop-tetrafluoroeth, polyethylene glycol, prochlorperazine **OR** prochlorperazine **OR** prochlorperazine, sorbitol, traZODone    Justin Mend, MD 11/11/2019  12:26 PM

## 2019-11-11 NOTE — Progress Notes (Signed)
Slept good. PRN oxy ir given at 2252. PRN miralax given Saturday morning and dose given this morning. No results thus far. No void, bladder scan=35cc's. C/O numbness & tingling to RUE & RLE. [name] A

## 2019-11-11 NOTE — Plan of Care (Signed)
  Problem: Consults Goal: RH STROKE PATIENT EDUCATION Description: See Patient Education module for education specifics  Outcome: Progressing   Problem: RH BLADDER ELIMINATION Goal: RH STG MANAGE BLADDER WITH ASSISTANCE Description: STG Manage Bladder With Mod I Assistance Outcome: Progressing   Problem: RH SKIN INTEGRITY Goal: RH STG MAINTAIN SKIN INTEGRITY WITH ASSISTANCE Description: STG Maintain Skin Integrity With Mod I Assistance. Outcome: Progressing Goal: RH STG ABLE TO PERFORM INCISION/WOUND CARE W/ASSISTANCE Description: STG Able To Perform Incision/Wound Care With Mod I Assistance. Outcome: Progressing   Problem: RH SAFETY Goal: RH STG ADHERE TO SAFETY PRECAUTIONS W/ASSISTANCE/DEVICE Description: STG Adhere to Safety Precautions With Mod I Assistance and appropriate assistive Device. Outcome: Progressing   Problem: RH PAIN MANAGEMENT Goal: RH STG PAIN MANAGED AT OR BELOW PT'S PAIN GOAL Description: <3 on a 0-10 pain scale. Outcome: Progressing   Problem: RH BOWEL ELIMINATION Goal: RH STG MANAGE BOWEL WITH ASSISTANCE Description: STG Manage Bowel with Mod I Assistance. Outcome: Not Progressing Goal: RH STG MANAGE BOWEL W/MEDICATION W/ASSISTANCE Description: STG Manage Bowel with Medication with Mod I Assistance. Outcome: Not Progressing

## 2019-11-12 ENCOUNTER — Inpatient Hospital Stay (HOSPITAL_COMMUNITY): Payer: BC Managed Care – PPO

## 2019-11-12 ENCOUNTER — Inpatient Hospital Stay (HOSPITAL_COMMUNITY): Payer: BC Managed Care – PPO | Admitting: Physical Therapy

## 2019-11-12 ENCOUNTER — Inpatient Hospital Stay (HOSPITAL_COMMUNITY): Payer: BC Managed Care – PPO | Admitting: Occupational Therapy

## 2019-11-12 LAB — CBC
HCT: 31.1 % — ABNORMAL LOW (ref 39.0–52.0)
Hemoglobin: 9.7 g/dL — ABNORMAL LOW (ref 13.0–17.0)
MCH: 29.6 pg (ref 26.0–34.0)
MCHC: 31.2 g/dL (ref 30.0–36.0)
MCV: 94.8 fL (ref 80.0–100.0)
Platelets: 447 10*3/uL — ABNORMAL HIGH (ref 150–400)
RBC: 3.28 MIL/uL — ABNORMAL LOW (ref 4.22–5.81)
RDW: 13.9 % (ref 11.5–15.5)
WBC: 15.8 10*3/uL — ABNORMAL HIGH (ref 4.0–10.5)
nRBC: 0 % (ref 0.0–0.2)

## 2019-11-12 LAB — RENAL FUNCTION PANEL
Albumin: 2.4 g/dL — ABNORMAL LOW (ref 3.5–5.0)
Anion gap: 12 (ref 5–15)
BUN: 57 mg/dL — ABNORMAL HIGH (ref 6–20)
CO2: 26 mmol/L (ref 22–32)
Calcium: 11.5 mg/dL — ABNORMAL HIGH (ref 8.9–10.3)
Chloride: 96 mmol/L — ABNORMAL LOW (ref 98–111)
Creatinine, Ser: 8.45 mg/dL — ABNORMAL HIGH (ref 0.61–1.24)
GFR calc Af Amer: 8 mL/min — ABNORMAL LOW (ref >60)
GFR calc non Af Amer: 7 mL/min — ABNORMAL LOW (ref >60)
Glucose, Bld: 120 mg/dL — ABNORMAL HIGH (ref 70–99)
Phosphorus: 7 mg/dL — ABNORMAL HIGH (ref 2.5–4.6)
Potassium: 4.4 mmol/L (ref 3.5–5.1)
Sodium: 134 mmol/L — ABNORMAL LOW (ref 135–145)

## 2019-11-12 MED ORDER — POLYETHYLENE GLYCOL 3350 17 G PO PACK
17.0000 g | PACK | Freq: Two times a day (BID) | ORAL | Status: DC
  Administered 2019-11-13 – 2019-11-22 (×18): 17 g via ORAL
  Filled 2019-11-12 (×18): qty 1

## 2019-11-12 MED ORDER — HEPARIN SODIUM (PORCINE) 1000 UNIT/ML IJ SOLN
INTRAMUSCULAR | Status: AC
  Administered 2019-11-12: 3200 [IU] via INTRAVENOUS_CENTRAL
  Filled 2019-11-12: qty 4

## 2019-11-12 MED ORDER — SENNOSIDES-DOCUSATE SODIUM 8.6-50 MG PO TABS: 2.0000 | ORAL_TABLET | Freq: Every evening | ORAL | Status: DC | PRN

## 2019-11-12 MED ORDER — MILK AND MOLASSES ENEMA
1.0000 | Freq: Once | RECTAL | Status: AC
  Administered 2019-11-12: 240 mL via RECTAL
  Filled 2019-11-12: qty 240

## 2019-11-12 MED ORDER — PENTAFLUOROPROP-TETRAFLUOROETH EX AERO: 1.0000 "application " | INHALATION_SPRAY | CUTANEOUS | Status: DC | PRN

## 2019-11-12 MED ORDER — ALTEPLASE 2 MG IJ SOLR: 2.0000 mg | Freq: Once | INTRAMUSCULAR | Status: DC | PRN

## 2019-11-12 MED ORDER — MAGNESIUM CITRATE PO SOLN
1.0000 | Freq: Once | ORAL | Status: AC
  Administered 2019-11-12: 1 via ORAL
  Filled 2019-11-12: qty 296

## 2019-11-12 MED ORDER — SODIUM CHLORIDE 0.9 % IV SOLN: 100.0000 mL | INTRAVENOUS | Status: DC | PRN

## 2019-11-12 MED ORDER — LIDOCAINE-PRILOCAINE 2.5-2.5 % EX CREA
1.0000 "application " | TOPICAL_CREAM | CUTANEOUS | Status: DC | PRN
  Filled 2019-11-12: qty 5

## 2019-11-12 MED ORDER — HEPARIN SODIUM (PORCINE) 1000 UNIT/ML DIALYSIS
4000.0000 [IU] | INTRAMUSCULAR | Status: DC | PRN
  Filled 2019-11-12: qty 4

## 2019-11-12 MED ORDER — LIDOCAINE HCL (PF) 1 % IJ SOLN
5.0000 mL | INTRAMUSCULAR | Status: DC | PRN
  Filled 2019-11-12: qty 5

## 2019-11-12 MED ORDER — SORBITOL 70 % SOLN
30.0000 mL | Freq: Two times a day (BID) | Status: DC | PRN
  Administered 2019-11-16: 30 mL via ORAL
  Filled 2019-11-12 (×2): qty 30

## 2019-11-12 MED ORDER — BISACODYL 10 MG RE SUPP
10.0000 mg | Freq: Once | RECTAL | Status: DC
  Filled 2019-11-12: qty 1

## 2019-11-12 MED ORDER — HEPARIN SODIUM (PORCINE) 1000 UNIT/ML DIALYSIS: 1000.0000 [IU] | INTRAMUSCULAR | Status: DC | PRN

## 2019-11-12 NOTE — Progress Notes (Signed)
Occupational Therapy Session Note ° °Patient Details  °Name: [name] °MRN: [id number] °Date of Birth: 11/03/1970 ° °Today's Date: 11/12/2019 °OT Individual Time: 0736-0830 °OT Individual Time Calculation (min): 54 min  ° ° °Short Term Goals: °Week 1:  OT Short Term Goal 1 (Week 1): Pt will stand to groom for 2 items to show improved endurance wiht CGA °OT Short Term Goal 2 (Week 1): Pt will demo edema management techniques with no VC °OT Short Term Goal 3 (Week 1): Pt will don shirt wiht set up °OT Short Term Goal 4 (Week 1): Pt will thread BLE into pants wiht AE PRN ° °Skilled Therapeutic Interventions/Progress Updates:  °  Pt received supine with breakfast untouched, c/o 6/10 pain in his R arm and leg described as aching. Pt reporting he is premedicated. Min cueing for initiation for pt to begin eating breakfast. Pt completed bed mobility with CGA to EOB. Pt completed sit > stand with min A and min cueing for UE placement. Pt required mod cueing for safety when completing ambulatory transfer to his w/c, min A overall- no R knee buckling. Pt completed UB bathing at the sink with set up assist. He stood with CGA and completed LB bathing with min stabilization support. Scrotal sling doffed, hygiene completed and then re-donned with mod A. Pt with good functional use of his swollen RUE. Pt required mod A to thread pants over distal LE. He was able to pull up in standing with CGA. Pt agreeable to hair washing in the sink for improved psychosocial adjustment. Pt with flat affect throughout session, initiated conversation on psychosocial adjustment to CVA. Hair care provided with max A. Pt was taken via w/c to the therapy gym. He completed 4x trials of 25 ft of functional mobility with focus on safe RW management and stand > sit sequencing. Min cueing overall provided. Pt returned to his room and was left supine with all needs met, bed alarm set.  ° °Therapy Documentation °Precautions:  °Precautions °Precautions:  Fall °Precaution Comments: foot drop on RLE °Restrictions °Weight Bearing Restrictions: No ° ° °Therapy/Group: Individual Therapy ° ° H  °11/12/2019, 6:44 AM °

## 2019-11-12 NOTE — Plan of Care (Signed)
No BM since 9/13 even with multiple laxatives/softeners, etc.

## 2019-11-12 NOTE — Progress Notes (Signed)
Inpatient Rehabilitation  Patient information reviewed and entered into eRehab system by Esty Ahuja M. Mattison Stuckey, M.A., CCC/SLP, PPS Coordinator.  Information including medical coding, functional ability and quality indicators will be reviewed and updated through discharge.    

## 2019-11-12 NOTE — Progress Notes (Signed)
Patient ID: [name], male   DOB: April 01, 1970, 49 y.o.   MRN: [id number] S: Seen while on HD and is without complaints. O:BP (!) 89/64 (BP Location: Left Arm)   Pulse (!) 116   Temp 98.4 F (36.9 C) (Oral)   Resp 16   Ht 5' 11"  (1.803 m)   Wt 105.9 kg   SpO2 94%   BMI 32.56 kg/m   Intake/Output Summary (Last 24 hours) at 11/12/2019 1725 Last data filed at 11/12/2019 0930 Gross per 24 hour  Intake 118 ml  Output --  Net 118 ml   Intake/Output: I/O last 3 completed shifts: In: 512 [P.O.:512] Out: 0   Intake/Output this shift:  Total I/O In: 118 [P.O.:118] Out: -  Weight change: 0.2 kg Gen: NAD CVS: tachy at 116 Resp: CTA Abd: +BS, soft, NT/ND Ext: 1+ edema of all extremities  Recent Labs  Lab 11/06/19 0532 11/07/19 0429 11/08/19 0608 11/09/19 0436 11/10/19 0451 11/12/19 1132  NA 133* 131* 133* 132* 134* 134*  K 4.1 4.1 4.0 4.2 4.0 4.4  CL 98 98 99 98 97* 96*  CO2 25 20* 25 24 25 26   GLUCOSE 116* 104* 107* 106* 106* 120*  BUN 63* 85* 54* 74* 47* 57*  CREATININE 8.73* 10.88* 8.48* 10.43* 7.98* 8.45*  ALBUMIN 2.0* 2.0* 2.0*  --  2.0* 2.4*  CALCIUM 7.3* 7.5* 7.9* 8.0* 8.4* 11.5*  PHOS 6.6* 7.8*  --   --   --  7.0*  AST 93* 76* 57*  --  41  --   ALT 42 36 32  --  24  --    Liver Function Tests: Recent Labs  Lab 11/07/19 0429 11/07/19 0429 11/08/19 0608 11/10/19 0451 11/12/19 1132  AST 76*  --  57* 41  --   ALT 36  --  32 24  --   ALKPHOS 38  --  42 42  --   BILITOT 0.9  --  0.6 0.4  --   PROT 4.5*  --  4.3* 4.4*  --   ALBUMIN 2.0*   < > 2.0* 2.0* 2.4*   < > = values in this interval not displayed.   No results for input(s): LIPASE, AMYLASE in the last 168 hours. No results for input(s): AMMONIA in the last 168 hours. CBC: Recent Labs  Lab 11/07/19 0429 11/07/19 0429 11/08/19 0608 11/08/19 0608 11/09/19 0436 11/10/19 0451 11/12/19 0957  WBC 16.5*   < > 13.8*   < > 12.6* 11.9* 15.8*  NEUTROABS 12.0*   < > 10.1*  --  9.5* 8.7*  --   HGB  9.5*   < > 9.0*   < > 9.1* 8.1* 9.7*  HCT 29.1*   < > 28.0*   < > 27.9* 25.8* 31.1*  MCV 93.6  --  93.0  --  93.9 94.9 94.8  PLT 271   < > 279   < > 318 315 447*   < > = values in this interval not displayed.   Cardiac Enzymes: Recent Labs  Lab 11/06/19 0532 11/07/19 0429 11/08/19 0608 11/09/19 0436  CKTOTAL 6,180* 4,799* 2,968* 2,456*   CBG: Recent Labs  Lab 11/08/19 1719  GLUCAP 108*    Iron Studies: No results for input(s): IRON, TIBC, TRANSFERRIN, FERRITIN in the last 72 hours. Studies/Results: No results found. Marland Kitchen aspirin EC  81 mg Oral Daily  . calcium acetate  667 mg Oral TID WC  . calcium carbonate  400 mg of elemental  calcium Oral BID  . Chlorhexidine Gluconate Cloth  6 each Topical Daily  . Chlorhexidine Gluconate Cloth  6 each Topical Q0600  . gabapentin  100 mg Oral TID  . heparin  5,000 Units Subcutaneous Q8H  . milk and molasses  1 enema Rectal Once  . polyethylene glycol  17 g Oral BID    BMET    Component Value Date/Time   NA 134 (L) 11/12/2019 1132   K 4.4 11/12/2019 1132   CL 96 (L) 11/12/2019 1132   CO2 26 11/12/2019 1132   GLUCOSE 120 (H) 11/12/2019 1132   BUN 57 (H) 11/12/2019 1132   CREATININE 8.45 (H) 11/12/2019 1132   CALCIUM 11.5 (H) 11/12/2019 1132   GFRNONAA 7 (L) 11/12/2019 1132   GFRAA 8 (L) 11/12/2019 1132   CBC    Component Value Date/Time   WBC 15.8 (H) 11/12/2019 0957   RBC 3.28 (L) 11/12/2019 0957   HGB 9.7 (L) 11/12/2019 0957   HCT 31.1 (L) 11/12/2019 0957   PLT 447 (H) 11/12/2019 0957   MCV 94.8 11/12/2019 0957   MCH 29.6 11/12/2019 0957   MCHC 31.2 11/12/2019 0957   RDW 13.9 11/12/2019 0957   LYMPHSABS 1.5 11/10/2019 0451   MONOABS 1.0 11/10/2019 0451   EOSABS 0.2 11/10/2019 0451   BASOSABS 0.1 11/10/2019 0451   HPI: 49 yo with h/o Crohn's disease, depression, found down at home after drinking alcohol and + cocaine,  In the ED he was noted to have low BP's,  Cr 5.9, K >7, MRI with watershed pattern of infarct, CK  level >50,000, AST 4,337, ALT 1851, WBC 43.5, Hgb 19.3, plt 276.  Assessment/Plan:  1. AKI presumably due to ischemic ATN from hypotension/hypovolemia as well as rhabdomyolysis, +/- cocaine.  S/p TDC on 10/31/19 and started on HD 10/31/19 and has remained oliguric/anuric and dialysis dependent. 2. Rhabdomyolysis- improving with HD 3. Drug abuse- per primary 4. H/o Crohn's- stable on remicade 5. Anemia of critical illness and AKI.  S/p IV iron and aranesp 40 mcg.  Follow h/h. 6. Deconditioning- now in CIR.  [name], MD [employer] [phone]

## 2019-11-12 NOTE — Progress Notes (Signed)
Slept good. Decreased initiation, flat affect. No unsafe behaviors. Still no BM after 2 doses of miralax and 2 doses of sorbitol. Bowel sounds hyperactive. Offered suppository this morning, patient wanted to try another dose of miralax. Scrotal edema, using scrotal support. RUE and RLE edema, continues to complain of numbness and tingling. He doesn't feel neurontin is helping. PRN oxy IR given at 2213 & 0542. Left PRAFO applied at HS. [name] A

## 2019-11-12 NOTE — Progress Notes (Signed)
[facility] PHYSICAL MEDICINE & REHABILITATION PROGRESS NOTE   Subjective/Complaints:  Will order mg citrate for pt- says feeling OK, 'initially said constipation better, but then admitted no BM- slept OK.   WBC back up to 15.9- however is volume contracted, which could be adding to issues. Is afebrile- BP occ on low side.   Review of systems:  Pt denies SOB, abd pain, CP, N/V/C/D, and vision changes   Objective:   No results found. Recent Labs    11/10/19 0451 11/12/19 0957  WBC 11.9* 15.8*  HGB 8.1* 9.7*  HCT 25.8* 31.1*  PLT 315 447*   Recent Labs    11/10/19 0451 11/12/19 1132  NA 134* 134*  K 4.0 4.4  CL 97* 96*  CO2 25 26  GLUCOSE 106* 120*  BUN 47* 57*  CREATININE 7.98* 8.45*  CALCIUM 8.4* 11.5*    Intake/Output Summary (Last 24 hours) at 11/12/2019 1859 Last data filed at 11/12/2019 1859 Gross per 24 hour  Intake 358 ml  Output 2410 ml  Net -2052 ml        Physical Exam: Vital Signs Blood pressure 116/83, pulse 95, temperature 98.5 F (36.9 C), temperature source Oral, resp. rate 16, height 5' 11"  (1.803 m), weight 103.3 kg, SpO2 95 %.  General: sitting up in room, with therapy, more appropriate, NAD Mood and affect are appropriate Heart: RRR Lungs: CTA B/L- no W/R/R- good air movement Abdomen: soft, but somewhat distended- hypoactive BS Extremities: diffuse RUE and LLE swelling trace pitting , motor strength is 5/5 in left deltoid, bicep, tricep, grip, hip flexor, knee extensors, ankle dorsiflexor and plantar flexor 3/5 in the right deltoid bicep tricep grip, 0/5 in the right hip flexor knee extensor trace ankle dorsiflexor plantar flexor Sensory exam normal sensation to light touch  bilateral upper and left lower extremities, right lower extremity no sensation below the ankle Musculoskeletal: Full range of motion in all 4 extremities. No joint swelling  Assessment/Plan: 1. Functional deficits secondary to anoxic strokes with rhabdomyolysis  right upper and right lower limb causing weakness which require 3+ hours per day of interdisciplinary therapy in a comprehensive inpatient rehab setting.  Physiatrist is providing close team supervision and 24 hour management of active medical problems listed below.  Physiatrist and rehab team continue to assess barriers to discharge/monitor patient progress toward functional and medical goals  Care Tool:  Bathing    Body parts bathed by patient: Right arm, Left arm, Chest, Abdomen, Front perineal area, Right upper leg, Left upper leg, Face, Buttocks   Body parts bathed by helper: Buttocks, Right lower leg, Left lower leg     Bathing assist Assist Level: Minimal Assistance - Patient > 75%     Upper Body Dressing/Undressing Upper body dressing   What is the patient wearing?: Pull over shirt    Upper body assist Assist Level: Supervision/Verbal cueing    Lower Body Dressing/Undressing Lower body dressing      What is the patient wearing?: Pants     Lower body assist Assist for lower body dressing: Moderate Assistance - Patient 50 - 74%     Toileting Toileting    Toileting assist Assist for toileting: Moderate Assistance - Patient 50 - 74%     Transfers Chair/bed transfer  Transfers assist     Chair/bed transfer assist level: Minimal Assistance - Patient > 75%     Locomotion Ambulation   Ambulation assist      Assist level: Moderate Assistance - Patient 50 -  74% Assistive device: Hand held assist Max distance: 20   Walk 10 feet activity   Assist     Assist level: Moderate Assistance - Patient - 50 - 74% Assistive device: Other (comment)   Walk 50 feet activity   Assist Walk 50 feet with 2 turns activity did not occur: Safety/medical concerns         Walk 150 feet activity   Assist Walk 150 feet activity did not occur: Safety/medical concerns         Walk 10 feet on uneven surface  activity   Assist Walk 10 feet on uneven  surfaces activity did not occur: Safety/medical concerns         Wheelchair     Assist Will patient use wheelchair at discharge?: Yes (Per PT goals ) Type of Wheelchair: Manual    Wheelchair assist level: Minimal Assistance - Patient > 75% Max wheelchair distance: 150    Wheelchair 50 feet with 2 turns activity    Assist        Assist Level: Minimal Assistance - Patient > 75%   Wheelchair 150 feet activity     Assist      Assist Level: Minimal Assistance - Patient > 75%   Blood pressure 116/83, pulse 95, temperature 98.5 F (36.9 C), temperature source Oral, resp. rate 16, height 5' 11"  (1.803 m), weight 103.3 kg, SpO2 95 %.  Medical Problem List and Plan: 1.Right hemiparesis and generalized weaknesssecondary to rhabdomyolysis with neuropraxic peripheral nerve injury. Pt also with punctate occipital lobe and cerebellar infarcts as well as globi pallidi insults----potentially d/t anoxic injury -patient may shower -ELOS/Goals: 7-10 days 2. Antithrombotics: -DVT/anticoagulation:Pharmaceutical:Heparin -antiplatelet therapy: 22m daily 3. Pain Management:D/c dilaudid--has been using 2.5 mg/day--will transition to oxycodone prn.  Patient is doing well on current medications -will add gabapentin for neuropathic pain RLE  9/20- no pain complaints- con't regimen 4. Mood:LCSW to follow for evaluation and support. -antipsychotic agents: N/A 5. Neuropsych: This patientiscapable of making decisions on hisown behalf. 6. Skin/Wound Care:Routine pressure relief measures, local wound care asneeded. 7. Fluids/Electrolytes/Nutrition:Strict I/O. Check lytes in am.  8. AKI with rhabdomyolysis: CK- 2456 slowly coming down. HD dependent MWF- CLIP pending.  9. Crohn's disease: Remicade on hold.  10. Abnormal LFTs: Resolving with AST 9137-->57. Recheck LFTs in am.  11. Polysubstance abuse: Has  completed CIWA protocol.  12. RUE/RLEweakness withedema and sensory loss,neuropraxia: -PROM/AROM, edema control -outpt EMG/NCS potentially 13. Leucocytosis: Continues to fluctuate--will monitor for signs of infection.  14. Anemia:Supplement with IV iron X 3 and Aranesp ordered. 15.Hyocalcemia: Treated with multiple doses IV calcium gluconate--now on CaCO3 bid.  16.  Constipation try Dulcolax tabs then Milk of molasses enema  9/20- to clean him out and added enema afterwards-  17. Leukocytosis  9/20- pt's WBC up to 15.9- could be constipation- or hemocontraction, or something else- not clear if he's anuric or oliguric- might need to check U/A and Cx- will order labs for tomorrow, due to WBC.   LOS: 3 days A FACE TO FACE EVALUATION WAS PERFORMED  Calee Nugent 11/12/2019, 6:59 PM

## 2019-11-12 NOTE — Progress Notes (Signed)
Physical Therapy Session Note  Patient Details  Name: [name] MRN: [id number] Date of Birth: 30-Oct-1970  Today's Date: 11/12/2019 PT Individual Time: 0900-0945 PT Individual Time Calculation (min): 45 min   Short Term Goals: Week 1:  PT Short Term Goal 1 (Week 1): Pt will perfom stand pivot transfer with min assist consistently. PT Short Term Goal 2 (Week 1): Pt will ambulate 122f with RW and min assist PT Short Term Goal 3 (Week 1): Pt will ascend/descend 4 steps with min assist and BUE support  Skilled Therapeutic Interventions/Progress Updates: Pt presented in bed agreeable to therapy. Pt c/o pain 6/10 on R side, premedicated. Rest breaks provided as needed. Pt performed bed mobility with CGA and increased time with use of bed features. Performed STS with CGA and use of RW and stand pivot with RW CGA to w/c. Pt transported to rehab gym and performed stand pivot to high/low mat. Pt ambulated x 248fwith RW and with minA with cues for quad activation for knee extension in stance phase. PTA then applied Ace bandage for DF assist. Pt ambulated additional 2517f 3 with improved knee stability and swing through. Participated in toe taps to 4in step 2 x 10 for NMR and forced use of RLE. Pt also performed alternating toe taps x 10 with PTA facilitating wt shift to R. Pt also participated in static balance playing x 3 bouts of horseshoes requiring seated rest between bouts due to fatigue. With RW support pt was able to use RUE high/low/crossing midline with CGA overall. Pt returned to w/c and transported back to room due to fatigue. Performed stand pivot to returned to bed and performed sit to supine with CGA and increased time. Pt left in bed resting comfortably with bed alarm on, call bell within reach and needs met.      Therapy Documentation Precautions:  Precautions Precautions: Fall Precaution Comments: foot drop on RLE Restrictions Weight Bearing Restrictions: No General:   Vital  Signs: Therapy Vitals Temp: 98.4 F (36.9 C) Temp Source: Oral Pulse Rate: (!) 114 Resp: 16 BP: (!) 101/58 Patient Position (if appropriate): Lying Oxygen Therapy SpO2: 94 % O2 Device: Room Air Pain:   Mobility:   Locomotion :    Trunk/Postural Assessment :    Balance:   Exercises:   Other Treatments:      Therapy/Group: Individual Therapy  Kayelee Herbig 11/12/2019, 4:36 PM

## 2019-11-12 NOTE — Progress Notes (Signed)
°   11/12/19 1725  Vital Signs  Temp 98.5 F (36.9 C)  Temp Source Oral  Pulse Rate 95  Pulse Rate Source Monitor  Resp 16  BP 116/83  BP Location Left Arm  BP Method Automatic  Patient Position (if appropriate) Lying  Oxygen Therapy  SpO2 95 %  O2 Device Room Air  Dialysis Weight  Weight 103.3 kg  Type of Weight Post-Dialysis  Post-Hemodialysis Assessment  Rinseback Volume (mL) 250 mL  KECN 245 V  Dialyzer Clearance Lightly streaked  Duration of HD Treatment -hour(s) 4 hour(s)  Hemodialysis Intake (mL) 600 mL  UF Total -Machine (mL) 3010 mL  Net UF (mL) 2410 mL  Tolerated HD Treatment Yes  Post-Hemodialysis Comments tx complete-pt stable  Hemodialysis Catheter Right Internal jugular Double lumen Permanent (Tunneled)  Placement Date/Time: 10/30/19 1802   Time Out: Correct patient;Correct site;Correct procedure  Maximum sterile barrier precautions: Hand hygiene;Cap;Mask;Sterile gown;Sterile gloves;Large sterile sheet  Site Prep: Chlorhexidine (preferred)  Local Anes...  Site Condition No complications  Blue Lumen Status Flushed;Capped (Central line);Heparin locked  Red Lumen Status Flushed;Capped (Central line);Heparin locked  Catheter fill solution Heparin 1000 units/ml  Catheter fill volume (Arterial) 1.6 cc  Catheter fill volume (Venous) 1.6  Dressing Type Occlusive  Dressing Status Clean;Dry;Intact  Dressing Change Due 11/16/19  Post treatment catheter status Capped and Clamped  HD tx complete-pt stable

## 2019-11-12 NOTE — Progress Notes (Signed)
Occupational Therapy Session Note  Patient Details  Name: [name] MRN: [id number] Date of Birth: 1970/07/28  Today's Date: 11/12/2019 OT Individual Time:  - 75 minutes missed     Short Term Goals: Week 1:  OT Short Term Goal 1 (Week 1): Pt will stand to groom for 2 items to show improved endurance wiht CGA OT Short Term Goal 2 (Week 1): Pt will demo edema management techniques with no VC OT Short Term Goal 3 (Week 1): Pt will don shirt wiht set up OT Short Term Goal 4 (Week 1): Pt will thread BLE into pants wiht AE PRN  Skilled Therapeutic Interventions/Progress Updates:    Pt seen for scheduled OT session but had already been taken off of the unit for HD tx. 75 minutes missed.   Therapy Documentation Precautions:  Precautions Precautions: Fall Precaution Comments: foot drop on RLE Restrictions Weight Bearing Restrictions: No  ADL: ADL Grooming: Supervision/safety Where Assessed-Grooming: Sitting at sink Upper Body Bathing: Supervision/safety Where Assessed-Upper Body Bathing: Sitting at sink Lower Body Bathing: Moderate assistance Where Assessed-Lower Body Bathing: Sitting at sink, Standing at sink Upper Body Dressing: Minimal assistance Where Assessed-Upper Body Dressing: Sitting at sink Lower Body Dressing: Maximal assistance Where Assessed-Lower Body Dressing: Sitting at sink, Standing at sink Toileting: Moderate assistance Where Assessed-Toileting: Toilet, Bedside Commode Toilet Transfer: Minimal assistance Toilet Transfer Method: Stand pivot      Therapy/Group: Individual Therapy  Liara Holm A Melesio Madara 11/12/2019, 7:21 AM

## 2019-11-12 NOTE — IPOC Note (Addendum)
Overall Plan of Care [facility]) Patient Details Name: [name] MRN: [id number] DOB: 1970-10-31  Admitting Diagnosis: Rhabdomyolysis  Hospital Problems: Principal Problem:   Rhabdomyolysis Active Problems:   Embolic stroke ([place])   Anoxic brain injury ([place])     Functional Problem List: Nursing Bladder, Bowel, Edema, Endurance, Medication Management, Nutrition, Pain, Safety, Sensory, Skin Integrity  PT Balance, Edema, Endurance, Motor, Pain, Perception, Safety, Sensory, Skin Integrity  OT Balance, Cognition, Edema, Endurance, Motor, Pain, Perception, Safety, Sensory  SLP Cognition  TR         Basic ADL's: OT Grooming, Bathing, Dressing, Toileting, Eating     Advanced  ADL's: OT Simple Meal Preparation     Transfers: PT Bed Mobility, Bed to Chair, Car, Furniture, Floor  OT Toilet, Metallurgist: PT Emergency planning/management officer, Stairs, Ambulation     Additional Impairments: OT Fuctional Use of Upper Extremity  SLP Social Cognition   Problem Solving, Memory, Awareness  TR      Anticipated Outcomes Item Anticipated Outcome  Self Feeding MOD I  Swallowing      Basic self-care  MOD I  Toileting  MOD I toilet; S shower   Bathroom Transfers MOD I toilet; S shower  Bowel/Bladder  Continent with Mod I assist  Transfers  Mod I with LRAD  Locomotion  Mod I WC mobiltiy supervisoin assist ambulation with LRAD  Communication     Cognition  mod I  Pain  <3 on a 0-10 pain scale  Safety/Judgment  Mod I assist for safety   Therapy Plan: PT Intensity: Minimum of 1-2 x/day ,45 to 90 minutes PT Frequency: 5 out of 7 days PT Duration Estimated Length of Stay: 12-14 days OT Intensity: Minimum of 1-2 x/day, 45 to 90 minutes OT Frequency: 5 out of 7 days OT Duration/Estimated Length of Stay: 10-14 SLP Intensity: Minumum of 1-2 x/day, 30 to 90 minutes SLP Frequency: 1 to 3 out of 7 days SLP Duration/Estimated Length of Stay: 10-14 days   Due to the current  state of emergency, patients may not be receiving their 3-hours of Medicare-mandated therapy.   Team Interventions: Nursing Interventions Patient/Family Education, Pain Management, Bladder Management, Medication Management, Discharge Planning, Bowel Management, Skin Care/Wound Management, Disease Management/Prevention, Psychosocial Support  PT interventions Ambulation/gait training, Training and development officer, Cognitive remediation/compensation, Community reintegration, Discharge planning, Disease management/prevention, DME/adaptive equipment instruction, Functional electrical stimulation, Functional mobility training, Neuromuscular re-education, Pain management, Patient/family education, Stair training, Splinting/orthotics, Skin care/wound management, Psychosocial support, Therapeutic Activities, Therapeutic Exercise, UE/LE Strength taining/ROM, UE/LE Coordination activities, Visual/perceptual remediation/compensation, Wheelchair propulsion/positioning  OT Interventions Training and development officer, DME/adaptive equipment instruction, Therapeutic Activities, Patient/family education, Wheelchair propulsion/positioning, Therapeutic Exercise, Psychosocial support, Cognitive remediation/compensation, Community reintegration, Functional mobility training, Self Care/advanced ADL retraining, UE/LE Strength taining/ROM, UE/LE Coordination activities, Skin care/wound managment, Neuromuscular re-education, Discharge planning, Disease mangement/prevention, Pain management, Splinting/orthotics  SLP Interventions Cognitive remediation/compensation, Cueing hierarchy, Functional tasks, Patient/family education, Environmental controls, Internal/external aids  TR Interventions    SW/CM Interventions Discharge Planning, Psychosocial Support, Patient/Family Education   Barriers to Discharge MD  Medical stability, Home enviroment access/loayout, Lack of/limited family support, Weight, Hemodialysis, Weight bearing  restrictions and Behavior  Nursing Inaccessible home environment, Decreased caregiver support, Home environment access/layout, Medication compliance, Lack of/limited family support, Weight, Hemodialysis    PT Inaccessible home environment, Home environment Child psychotherapist, Insurance for SNF coverage    OT Decreased caregiver support    SLP      SW Lack of/limited family support, Hemodialysis     Team  Discharge Planning: Destination: PT-Home ,OT- Home , SLP-Home Projected Follow-up: PT-Home health PT, OT-  Outpatient OT, SLP-None Projected Equipment Needs: PT-To be determined, Wheelchair cushion (measurements), Wheelchair (measurements), OT- 3 in 1 bedside comode, Tub/shower seat, SLP-None recommended by SLP Equipment Details: PT- , OT-  Patient/family involved in discharge planning: PT- Patient,  OT-Patient, SLP-Patient  MD ELOS: 12-14 days Medical Rehab Prognosis:  Good Assessment: Pt is a 49 yr old male with hx of Rhambdo and R hemiparesis with cerebellar infarcts and globi palidi infarcts and neuropraxic peripheral nerve injury and ESRD- on HD - also hx of Crohn's disease and Polysubstance abuse and leukocytosis- will recheck in AM  Goals Mod I by d/c.   See Team Conference Notes for weekly updates to the plan of care

## 2019-11-12 NOTE — Progress Notes (Signed)
Team Conference Report to Patient/Family  Team Conference discussion was reviewed with the patient and caregiver, including goals, any changes in plan of care and target discharge date.  Patient and caregiver express understanding and are in agreement.  The patient has a target discharge date of 11/21/19.  Dyanne Iha 11/14/2019, 1:09 PM

## 2019-11-12 NOTE — Progress Notes (Signed)
Inpatient Rehabilitation Center Individual Statement of Services  Patient Name:  [name]  Date:  11/12/2019  Welcome to the [facility].  Our goal is to provide you with an individualized program based on your diagnosis and situation, designed to meet your specific needs.  With this comprehensive rehabilitation program, you will be expected to participate in at least 3 hours of rehabilitation therapies Monday-Friday, with modified therapy programming on the weekends.  Your rehabilitation program will include the following services:  Physical Therapy (PT), Occupational Therapy (OT), Speech Therapy (ST), 24 hour per day rehabilitation nursing, Therapeutic Recreaction (TR), Neuropsychology, Care Coordinator, Rehabilitation Medicine, Nutrition Services and Pharmacy Services  Weekly team conferences will be held on Wednesdays to discuss your progress.  Your Inpatient Rehabilitation Care Coordinator will talk with you frequently to get your input and to update you on team discussions.  Team conferences with you and your family in attendance may also be held.  Expected length of stay: 7-10 Days  Overall anticipated outcome: MOD I  Depending on your progress and recovery, your program may change. Your Inpatient Rehabilitation Care Coordinator will coordinate services and will keep you informed of any changes. Your Inpatient Rehabilitation Care Coordinator's name and contact numbers are listed  below.  The following services may also be recommended but are not provided by the [facility]:    [place] will be made to provide these services after discharge if needed.  Arrangements include referral to agencies that provide these services.  Your insurance has been verified to be:  [employer] Your primary doctor is:  Lavone Orn, MD  Pertinent information will be shared with  your doctor and your insurance company.  Inpatient Rehabilitation Care Coordinator:  Erlene Quan, [place] or [phone]  Information discussed with and copy given to patient by: Dyanne Iha, 11/12/2019, 12:10 PM

## 2019-11-12 NOTE — Progress Notes (Signed)
Patient ID: [name], male   DOB: 08/23/1970, 49 y.o.   MRN: [id number] Milk and molassas enema per order with large hard formed stool and soft formed stool x 2 p administration. Tolerated procedure well. [name]

## 2019-11-13 ENCOUNTER — Inpatient Hospital Stay (HOSPITAL_COMMUNITY): Payer: BC Managed Care – PPO | Admitting: Physical Therapy

## 2019-11-13 ENCOUNTER — Inpatient Hospital Stay (HOSPITAL_COMMUNITY): Payer: BC Managed Care – PPO

## 2019-11-13 ENCOUNTER — Inpatient Hospital Stay (HOSPITAL_COMMUNITY): Payer: BC Managed Care – PPO | Admitting: Occupational Therapy

## 2019-11-13 LAB — CBC WITH DIFFERENTIAL/PLATELET
Abs Immature Granulocytes: 0.5 10*3/uL — ABNORMAL HIGH (ref 0.00–0.07)
Basophils Absolute: 0.1 10*3/uL (ref 0.0–0.1)
Basophils Relative: 1 %
Eosinophils Absolute: 0.2 10*3/uL (ref 0.0–0.5)
Eosinophils Relative: 2 %
HCT: 27.9 % — ABNORMAL LOW (ref 39.0–52.0)
Hemoglobin: 8.6 g/dL — ABNORMAL LOW (ref 13.0–17.0)
Immature Granulocytes: 5 %
Lymphocytes Relative: 13 %
Lymphs Abs: 1.4 10*3/uL (ref 0.7–4.0)
MCH: 29.8 pg (ref 26.0–34.0)
MCHC: 30.8 g/dL (ref 30.0–36.0)
MCV: 96.5 fL (ref 80.0–100.0)
Monocytes Absolute: 1 10*3/uL (ref 0.1–1.0)
Monocytes Relative: 9 %
Neutro Abs: 7.6 10*3/uL (ref 1.7–7.7)
Neutrophils Relative %: 70 %
Platelets: 360 10*3/uL (ref 150–400)
RBC: 2.89 MIL/uL — ABNORMAL LOW (ref 4.22–5.81)
RDW: 14.3 % (ref 11.5–15.5)
WBC: 10.8 10*3/uL — ABNORMAL HIGH (ref 4.0–10.5)
nRBC: 0 % (ref 0.0–0.2)

## 2019-11-13 LAB — RENAL FUNCTION PANEL
Albumin: 2.2 g/dL — ABNORMAL LOW (ref 3.5–5.0)
Anion gap: 10 (ref 5–15)
BUN: 55 mg/dL — ABNORMAL HIGH (ref 6–20)
CO2: 27 mmol/L (ref 22–32)
Calcium: 13.8 mg/dL (ref 8.9–10.3)
Chloride: 97 mmol/L — ABNORMAL LOW (ref 98–111)
Creatinine, Ser: 8.8 mg/dL — ABNORMAL HIGH (ref 0.61–1.24)
GFR calc Af Amer: 7 mL/min — ABNORMAL LOW (ref >60)
GFR calc non Af Amer: 6 mL/min — ABNORMAL LOW (ref >60)
Glucose, Bld: 116 mg/dL — ABNORMAL HIGH (ref 70–99)
Phosphorus: 9.6 mg/dL — ABNORMAL HIGH (ref 2.5–4.6)
Potassium: 5.3 mmol/L — ABNORMAL HIGH (ref 3.5–5.1)
Sodium: 134 mmol/L — ABNORMAL LOW (ref 135–145)

## 2019-11-13 MED ORDER — SODIUM ZIRCONIUM CYCLOSILICATE 10 G PO PACK
10.0000 g | PACK | Freq: Once | ORAL | Status: AC
  Administered 2019-11-13: 10 g via ORAL
  Filled 2019-11-13: qty 1

## 2019-11-13 NOTE — Progress Notes (Signed)
Welcome PHYSICAL MEDICINE & REHABILITATION PROGRESS NOTE   Subjective/Complaints: No complaints. Working well with therapy. Pain is well controlled Denies constipation  Review of systems:  Pt denies SOB, abd pain, CP, N/V/C/D, and vision changes   Objective:   No results found. Recent Labs    11/12/19 0957 11/13/19 0636  WBC 15.8* 10.8*  HGB 9.7* 8.6*  HCT 31.1* 27.9*  PLT 447* 360   Recent Labs    11/12/19 1132 11/13/19 0636  NA 134* 134*  K 4.4 5.3*  CL 96* 97*  CO2 26 27  GLUCOSE 120* 116*  BUN 57* 55*  CREATININE 8.45* 8.80*  CALCIUM 11.5* 13.8*    Intake/Output Summary (Last 24 hours) at 11/13/2019 0948 Last data filed at 11/13/2019 0816 Gross per 24 hour  Intake 358 ml  Output 2410 ml  Net -2052 ml        Physical Exam: Vital Signs Blood pressure 131/75, pulse (!) 102, temperature 98.3 F (36.8 C), temperature source Oral, resp. rate 18, height 5' 11"  (1.803 m), weight 101.4 kg, SpO2 95 %. General: Alert, No apparent distress HEENT: Head is normocephalic, atraumatic, PERRLA, EOMI, sclera anicteric, oral mucosa pink and moist, dentition intact, ext ear canals clear,  Neck: Supple without JVD or lymphadenopathy Heart: Reg rate and rhythm. No murmurs rubs or gallops Chest: CTA bilaterally without wheezes, rales, or rhonchi; no distress Abdomen: soft, but somewhat distended- hypoactive BS Extremities: diffuse RUE and LLE swelling trace pitting , motor strength is 5/5 in left deltoid, bicep, tricep, grip, hip flexor, knee extensors, ankle dorsiflexor and plantar flexor 3/5 in the right deltoid bicep tricep grip, 0/5 in the right hip flexor knee extensor trace ankle dorsiflexor plantar flexor Sensory exam normal sensation to light touch  bilateral upper and left lower extremities, right lower extremity no sensation below the ankle Musculoskeletal: Full range of motion in all 4 extremities. No joint swelling  Assessment/Plan: 1. Functional deficits  secondary to anoxic strokes with rhabdomyolysis right upper and right lower limb causing weakness which require 3+ hours per day of interdisciplinary therapy in a comprehensive inpatient rehab setting.  Physiatrist is providing close team supervision and 24 hour management of active medical problems listed below.  Physiatrist and rehab team continue to assess barriers to discharge/monitor patient progress toward functional and medical goals  Care Tool:  Bathing    Body parts bathed by patient: Right arm, Left arm, Chest, Abdomen, Front perineal area, Right upper leg, Left upper leg, Face, Buttocks   Body parts bathed by helper: Buttocks, Right lower leg, Left lower leg     Bathing assist Assist Level: Minimal Assistance - Patient > 75%     Upper Body Dressing/Undressing Upper body dressing   What is the patient wearing?: Pull over shirt    Upper body assist Assist Level: Supervision/Verbal cueing    Lower Body Dressing/Undressing Lower body dressing      What is the patient wearing?: Pants     Lower body assist Assist for lower body dressing: Moderate Assistance - Patient 50 - 74%     Toileting Toileting    Toileting assist Assist for toileting: Moderate Assistance - Patient 50 - 74%     Transfers Chair/bed transfer  Transfers assist     Chair/bed transfer assist level: Minimal Assistance - Patient > 75%     Locomotion Ambulation   Ambulation assist      Assist level: Moderate Assistance - Patient 50 - 74% Assistive device: Hand held assist Max  distance: 20   Walk 10 feet activity   Assist     Assist level: Moderate Assistance - Patient - 50 - 74% Assistive device: Other (comment)   Walk 50 feet activity   Assist Walk 50 feet with 2 turns activity did not occur: Safety/medical concerns         Walk 150 feet activity   Assist Walk 150 feet activity did not occur: Safety/medical concerns         Walk 10 feet on uneven surface   activity   Assist Walk 10 feet on uneven surfaces activity did not occur: Safety/medical concerns         Wheelchair     Assist Will patient use wheelchair at discharge?: Yes (Per PT goals ) Type of Wheelchair: Manual    Wheelchair assist level: Minimal Assistance - Patient > 75% Max wheelchair distance: 150    Wheelchair 50 feet with 2 turns activity    Assist        Assist Level: Minimal Assistance - Patient > 75%   Wheelchair 150 feet activity     Assist      Assist Level: Minimal Assistance - Patient > 75%   Blood pressure 131/75, pulse (!) 102, temperature 98.3 F (36.8 C), temperature source Oral, resp. rate 18, height 5' 11"  (1.803 m), weight 101.4 kg, SpO2 95 %.  Medical Problem List and Plan: 1.Right hemiparesis and generalized weaknesssecondary to rhabdomyolysis with neuropraxic peripheral nerve injury. Pt also with punctate occipital lobe and cerebellar infarcts as well as globi pallidi insults----potentially d/t anoxic injury -patient may shower -ELOS/Goals: 7-10 days  -Continue CIR 2. Antithrombotics: -DVT/anticoagulation:Pharmaceutical:Heparin -antiplatelet therapy: 9m daily 3. Pain Management:D/c dilaudid--has been using 2.5 mg/day--will transition to oxycodone prn.  Patient is doing well on current medications. Pain is well controlled 9/21 -will add gabapentin for neuropathic pain RLE 4. Mood:LCSW to follow for evaluation and support. -antipsychotic agents: N/A 5. Neuropsych: This patientiscapable of making decisions on hisown behalf. 6. Skin/Wound Care:Routine pressure relief measures, local wound care asneeded. 7. Fluids/Electrolytes/Nutrition:Strict I/O. Check lytes in am.  8. AKI with rhabdomyolysis: CK- 2456 slowly coming down. HD dependent MWF- CLIP pending.  9. Crohn's disease: Remicade on hold.  10. Abnormal LFTs: Resolving with AST 9137-->57.  Recheck LFTs in am.  11. Polysubstance abuse: Has completed CIWA protocol.  12. RUE/RLEweakness withedema and sensory loss,neuropraxia: -PROM/AROM, edema control -outpt EMG/NCS potentially 13. Leucocytosis: Trending downward.  14. Anemia:Supplement with IV iron X 3 and Aranesp ordered. 15.Hyocalcemia: Treated with multiple doses IV calcium gluconate--now on CaCO3 bid.  9/21: hypercalcemic today- nephro contacted.   16.  Constipation: resolved  LOS: 4 days A FACE TO FACE EVALUATION WAS PERFORMED  KClide DeutscherRaulkar 11/13/2019, 9:48 AM

## 2019-11-13 NOTE — Progress Notes (Signed)
   11/13/19 1403  Assess: MEWS Score  Temp 98.2 F (36.8 C)  BP (!) 146/71  Pulse Rate (!) 113  Resp 18  Level of Consciousness Alert  SpO2 92 %  O2 Device Room Air  Assess: MEWS Score  MEWS Temp 0  MEWS Systolic 0  MEWS Pulse 2  MEWS RR 0  MEWS LOC 0  MEWS Score 2  MEWS Score Color Yellow  Assess: if the MEWS score is Yellow or Red  Were vital signs taken at a resting state? Yes  Focused Assessment No change from prior assessment  Early Detection of Sepsis Score *See Row Information* Low  MEWS guidelines implemented *See Row Information* Yes  Treat  Pain Scale 0-10  Pain Score 7  Pain Type Acute pain  Pain Location Generalized  Pain Orientation Right  Pain Descriptors / Indicators Aching;Burning  Pain Onset Gradual  Pain Intervention(s) Rest (oxycodone not due yet)  Take Vital Signs  Increase Vital Sign Frequency  Yellow: Q 2hr X 2 then Q 4hr X 2, if remains yellow, continue Q 4hrs  Escalate  MEWS: Escalate Yellow: discuss with charge nurse/RN and consider discussing with provider and RRT  Notify: Charge Nurse/RN  Name of Charge Nurse/RN Notified [employer] RN  Date Charge Nurse/RN Notified 11/13/19  Time Charge Nurse/RN Notified 1405

## 2019-11-13 NOTE — Progress Notes (Signed)
Patient ID: [name], male   DOB: 22-Apr-1970, 49 y.o.   MRN: [id number] S: No new complaints. O:BP (!) 146/71 (BP Location: Left Arm)   Pulse (!) 113   Temp 98.2 F (36.8 C)   Resp 18   Ht 5' 11"  (1.803 m)   Wt 101.4 kg   SpO2 92%   BMI 31.18 kg/m   Intake/Output Summary (Last 24 hours) at 11/13/2019 1518 Last data filed at 11/13/2019 1445 Gross per 24 hour  Intake 580 ml  Output 2410 ml  Net -1830 ml   Intake/Output: I/O last 3 completed shifts: In: 358 [P.O.:358] Out: 2410 [Other:2410]  Intake/Output this shift:  Total I/O In: 340 [P.O.:340] Out: -  Weight change: 0.4 kg Gen: NAD CVS: tachy at 113, no rub Resp: cta Abd: +BS, soft, NT/ND Ext: 1+ edema, nonpitting all extremities, RUE in wrappings  Recent Labs  Lab 11/07/19 0429 11/08/19 0608 11/09/19 0436 11/10/19 0451 11/12/19 1132 11/13/19 0636  NA 131* 133* 132* 134* 134* 134*  K 4.1 4.0 4.2 4.0 4.4 5.3*  CL 98 99 98 97* 96* 97*  CO2 20* 25 24 25 26 27   GLUCOSE 104* 107* 106* 106* 120* 116*  BUN 85* 54* 74* 47* 57* 55*  CREATININE 10.88* 8.48* 10.43* 7.98* 8.45* 8.80*  ALBUMIN 2.0* 2.0*  --  2.0* 2.4* 2.2*  CALCIUM 7.5* 7.9* 8.0* 8.4* 11.5* 13.8*  PHOS 7.8*  --   --   --  7.0* 9.6*  AST 76* 57*  --  41  --   --   ALT 36 32  --  24  --   --    Liver Function Tests: Recent Labs  Lab 11/07/19 0429 11/07/19 0429 11/08/19 0608 11/08/19 0608 11/10/19 0451 11/12/19 1132 11/13/19 0636  AST 76*  --  57*  --  41  --   --   ALT 36  --  32  --  24  --   --   ALKPHOS 38  --  42  --  42  --   --   BILITOT 0.9  --  0.6  --  0.4  --   --   PROT 4.5*  --  4.3*  --  4.4*  --   --   ALBUMIN 2.0*   < > 2.0*   < > 2.0* 2.4* 2.2*   < > = values in this interval not displayed.   No results for input(s): LIPASE, AMYLASE in the last 168 hours. No results for input(s): AMMONIA in the last 168 hours. CBC: Recent Labs  Lab 11/08/19 0608 11/08/19 0608 11/09/19 0436 11/09/19 0436 11/10/19 0451 11/12/19 0957  11/13/19 0636  WBC 13.8*   < > 12.6*   < > 11.9* 15.8* 10.8*  NEUTROABS 10.1*   < > 9.5*  --  8.7*  --  7.6  HGB 9.0*   < > 9.1*   < > 8.1* 9.7* 8.6*  HCT 28.0*   < > 27.9*   < > 25.8* 31.1* 27.9*  MCV 93.0  --  93.9  --  94.9 94.8 96.5  PLT 279   < > 318   < > 315 447* 360   < > = values in this interval not displayed.   Cardiac Enzymes: Recent Labs  Lab 11/07/19 0429 11/08/19 0608 11/09/19 0436  CKTOTAL 4,799* 2,968* 2,456*   CBG: Recent Labs  Lab 11/08/19 1719  GLUCAP 108*    Iron Studies: No results for input(s):  IRON, TIBC, TRANSFERRIN, FERRITIN in the last 72 hours. Studies/Results: No results found. Marland Kitchen aspirin EC  81 mg Oral Daily  . Chlorhexidine Gluconate Cloth  6 each Topical Daily  . Chlorhexidine Gluconate Cloth  6 each Topical Q0600  . gabapentin  100 mg Oral TID  . heparin  5,000 Units Subcutaneous Q8H  . polyethylene glycol  17 g Oral BID    BMET    Component Value Date/Time   NA 134 (L) 11/13/2019 0636   K 5.3 (H) 11/13/2019 0636   CL 97 (L) 11/13/2019 0636   CO2 27 11/13/2019 0636   GLUCOSE 116 (H) 11/13/2019 0636   BUN 55 (H) 11/13/2019 0636   CREATININE 8.80 (H) 11/13/2019 0636   CALCIUM 13.8 (HH) 11/13/2019 0636   GFRNONAA 6 (L) 11/13/2019 0636   GFRAA 7 (L) 11/13/2019 0636   CBC    Component Value Date/Time   WBC 10.8 (H) 11/13/2019 0636   RBC 2.89 (L) 11/13/2019 0636   HGB 8.6 (L) 11/13/2019 0636   HCT 27.9 (L) 11/13/2019 0636   PLT 360 11/13/2019 0636   MCV 96.5 11/13/2019 0636   MCH 29.8 11/13/2019 0636   MCHC 30.8 11/13/2019 0636   RDW 14.3 11/13/2019 0636   LYMPHSABS 1.4 11/13/2019 0636   MONOABS 1.0 11/13/2019 0636   EOSABS 0.2 11/13/2019 0636   BASOSABS 0.1 11/13/2019 0636   HPI: 49 yo with h/o Crohn's disease, depression, found down at home after drinking alcohol and + cocaine,  In the ED he was noted to have low BP's,  Cr 5.9, K >7, MRI with watershed pattern of infarct, CK level >50,000, AST 4,337, ALT 1851, WBC 43.5,  Hgb 19.3, plt 276.  Assessment/Plan:  1. AKI presumably due to ischemic ATN from hypotension/hypovolemia as well as rhabdomyolysis, +/- cocaine.  S/p TDC on 10/31/19 and started on HD 10/31/19 and has remained oliguric/anuric and dialysis dependent. 1. Plan for HD tomorrow and keep on MWF schedule for now. 2. Raliegh Ip and BUN, will use low K bath and increase DFR and 4 hour treatments 2. Rhabdomyolysis- improving with HD 3. Hypercalcemia- have stopped phoslo and tums.  Will check iPTH and vit D levels and use low Ca dialysate tomorrow. 4. Hyperkalemia- will use low K bath with HD.  Dose lokelma x 1 today. 5. Drug abuse- per primary 6. H/o Crohn's- stable on remicade 7. Anemia of critical illness and AKI.  S/p IV iron and aranesp 40 mcg.  Follow h/h. 8. Deconditioning- now in CIR.  [name], MD [employer] [phone]

## 2019-11-13 NOTE — Progress Notes (Signed)
   Patient Details  Name: [name] MRN: [id number] Date of Birth: [birth year]  Today's Date: 11/13/2019  Hospital Problems: Principal Problem:   Rhabdomyolysis Active Problems:   Embolic stroke ([place])   Anoxic brain injury [facility])  Past Medical History:  Past Medical History:  Diagnosis Date  . Allergy   . Crohn's colitis ([place])   . Depression   . Pulmonary embolism [facility])    Past Surgical History:  Past Surgical History:  Procedure Laterality Date  . COLOSTOMY  March 2011  . Colostomy reversal  January 2012  . HIP SURGERY  R hip surgery   . IR FLUORO GUIDE CV LINE RIGHT  10/30/2019  . IR US GUIDE VASC ACCESS RIGHT  10/30/2019   Social History:  reports that he has been smoking. He has been smoking about 0.50 packs per day. He has never used smokeless tobacco. He reports current alcohol use. He reports that he does not use drugs.  Family / Support Systems Spouse/Significant Other: [name] Other Supports: Father Anticipated Caregiver: [name] (spouse) Ability/Limitations of Caregiver: Work Days (M-F 8-5) Caregiver Availability: Intermittent  Social History Preferred language: English Religion: Non-Denominational Read: Yes Write: Yes   Abuse/Neglect Abuse/Neglect Assessment Can Be Completed: Yes Physical Abuse: Denies Verbal Abuse: Denies Sexual Abuse: Denies Exploitation of patient/patient's resources: Denies Self-Neglect: Denies  Emotional Status Pt's affect, behavior and adjustment status: yes Recent Psychosocial Issues: no Psychiatric History: no Substance Abuse History: yes  Patient / Family Perceptions, Expectations & Goals Pt/Family understanding of illness & functional limitations: yes Premorbid pt/family roles/activities: independent/spouse/working/driving Anticipated changes in roles/activities/participation: Spouse able to asisst intermittently Pt/family expectations/goals: Goal to d/c home [name]: Other  (Comment) Premorbid Home Care/DME Agencies: Other (Comment) Transportation available at discharge: Family able to transport Resource referrals recommended: Neuropsychology  Discharge Planning Living Arrangements: Spouse/significant other, Children Support Systems: Spouse/significant other, Children Type of Residence: Private residence (1 level home: 2-3 steps to enter) Administrator, sports: Multimedia programmer (specify) (BC[employer] COMM) Museum/gallery curator Resources: Employment Money Management: Patient, Spouse Does the patient have any problems obtaining your medications?: No Home Management: Independent Care Coordinator Barriers to Discharge: Lack of/limited family support, Hemodialysis Care Coordinator Anticipated Follow Up Needs: HH/OP Expected length of stay: 7-10 Days  Clinical Impression SW entered patient room, introduced self, explained role and process. Patient father at bedside. Patient to be taken for HD, SW follow up wit patient father for questions and concerns. Will continue to follow up.   Dyanne Iha 11/13/2019, 9:01 AM

## 2019-11-13 NOTE — Progress Notes (Signed)
Speech Language Pathology Daily Session Note  Patient Details  Name: [name] MRN: [id number] Date of Birth: 09/21/1970  Today's Date: 11/13/2019 SLP Individual Time: 1445-1530 SLP Individual Time Calculation (min): 45 min  Short Term Goals: Week 1: SLP Short Term Goal 1 (Week 1): Pt will complete complex tasks with mod I functional problem solving. SLP Short Term Goal 2 (Week 1): Pt will recall new, complex information with mod I use of compensatory strategies. SLP Short Term Goal 3 (Week 1): Pt will alternate his attention between 2 mildly complex tasks with mod I.  Skilled Therapeutic Interventions:Skilled ST services focused on cognitive skills. SLP facilitated complex problem solving skills in functional tasks of medication management with TID pill organizer, account balancing and deductive reasoning puzzle, pt required supervision A verbal cues. Pt demonstrated appropriate verbal problem solving pertaining to medication consumed x3 a day, however required initial extensive cuing to carry over problem solving into novel pill organizer. Pt required x1 cue for error awareness while making calculation with calcuator during account balancing task and x1 verbal cue required for error awareness in deductive reasoning task with extra time provided. Pt supports cognitive skills beng close to baseline but still feels  "a bit foggy." SLP recommends to continue skilled ST service focusing on higher level cognitive skills, since pt was working and an active community member prior to hospitalization.  Pt was left in room with call bell within reach and bed alarm set.     Pain Pain Assessment Pain Scale: 0-10 Pain Score: 7  Faces Pain Scale: No hurt Pain Type: Acute pain Pain Location: Generalized Pain Orientation: Right Pain Descriptors / Indicators: Aching;Burning Pain Onset: Gradual Pain Intervention(s): Rest (oxycodone not due yet)  Therapy/Group: Individual Therapy  Shalen Petrak   [facility] 11/13/2019, 4:23 PM

## 2019-11-13 NOTE — Progress Notes (Signed)
Occupational Therapy Session Note  Patient Details  Name: [name] MRN: [id number] Date of Birth: Feb 15, 1971  Today's Date: 11/13/2019 OT Individual Time: 1100-1200 OT Individual Time Calculation (min): 60 min    Short Term Goals: Week 1:  OT Short Term Goal 1 (Week 1): Pt will stand to groom for 2 items to show improved endurance wiht CGA OT Short Term Goal 2 (Week 1): Pt will demo edema management techniques with no VC OT Short Term Goal 3 (Week 1): Pt will don shirt wiht set up OT Short Term Goal 4 (Week 1): Pt will thread BLE into pants wiht AE PRN  Skilled Therapeutic Interventions/Progress Updates:  Patient met lying supine in bed in agreement with OT treatment session. Patient reporting 7/10 pain at rest and with activity. Significant swelling to RUE and RLE. Supine to with  CGA and stand-pivot transfer to wc with CGA and use of RW. Seated at sink level, patient completed oral hygiene with use of dominant RUE with ability to open/close containers/tubes without assist. Patient reporting preference for washing up at sink level daily. With set-up assist, patient able to doff UB clothing and bathe UB with set-up A and LB with Mod A. In dayroom, OT provided education on edema management techniques including elevating RUE above the level of the heart, retrograde massage and compression with ace wraps. Patient expressed verbal understanding. Retrograde massage to RUE followed by wrapping to decrease swelling and increase function. Static standing activity with incorporation of reaching/grasping with occasional blocking of R knee to prevent buckling. Session concluded with patient lying supine in bed with call bell within reach, bed alarm activated, and all needs met.   Therapy Documentation Precautions:  Precautions Precautions: Fall Precaution Comments: foot drop on RLE Restrictions Weight Bearing Restrictions: No General:    Therapy/Group: Individual Therapy  Betul Brisky R  [place] 11/13/2019, 7:33 AM

## 2019-11-13 NOTE — Progress Notes (Signed)
Renal Navigator has met with patient to discuss OP HD referral for treatment for AKI if needed at discharge from CIR. Patient agreeable. Referral submitted to [facility]missions to request treatment at [facility], which is the closest clinic to patient's home.  Navigator will follow closely.  ,  Elizabeth, LCSW Renal Navigator [phone] 

## 2019-11-13 NOTE — Significant Event (Signed)
CRITICAL VALUE ALERT  Critical Value:  13.8 Calcium Date & Time Notied:  11/13/19 0815  Provider Notified: Algis Liming PA  Orders Received/Actions taken: Hold scheduled Tums. Paged MD [facility] nephrology to make them aware.  At [place] spoke to MD [place]- To hold Phoslo and Tums going forward

## 2019-11-13 NOTE — Plan of Care (Signed)
  Problem: Consults Goal: RH STROKE PATIENT EDUCATION Description: See Patient Education module for education specifics  Outcome: Progressing   Problem: RH BOWEL ELIMINATION Goal: RH STG MANAGE BOWEL WITH ASSISTANCE Description: STG Manage Bowel with Mod I Assistance. Outcome: Progressing Goal: RH STG MANAGE BOWEL W/MEDICATION W/ASSISTANCE Description: STG Manage Bowel with Medication with Mod I Assistance. Outcome: Progressing   Problem: RH BLADDER ELIMINATION Goal: RH STG MANAGE BLADDER WITH ASSISTANCE Description: STG Manage Bladder With Mod I Assistance Outcome: Progressing   Problem: RH SKIN INTEGRITY Goal: RH STG MAINTAIN SKIN INTEGRITY WITH ASSISTANCE Description: STG Maintain Skin Integrity With Mod I Assistance. Outcome: Progressing Goal: RH STG ABLE TO PERFORM INCISION/WOUND CARE W/ASSISTANCE Description: STG Able To Perform Incision/Wound Care With Mod I Assistance. Outcome: Progressing   Problem: RH SAFETY Goal: RH STG ADHERE TO SAFETY PRECAUTIONS W/ASSISTANCE/DEVICE Description: STG Adhere to Safety Precautions With Mod I Assistance and appropriate assistive Device. Outcome: Progressing   Problem: RH PAIN MANAGEMENT Goal: RH STG PAIN MANAGED AT OR BELOW PT'S PAIN GOAL Description: <3 on a 0-10 pain scale. Outcome: Progressing

## 2019-11-13 NOTE — Progress Notes (Signed)
Physical Therapy Session Note  Patient Details  Name: [name] MRN: [id number] Date of Birth: 07-05-1970  Today's Date: 11/13/2019 PT Individual Time: 1829-9371 and 1303-1330 PT Individual Time Calculation (min): 70 min and 27 min  Short Term Goals: Week 1:  PT Short Term Goal 1 (Week 1): Pt will perfom stand pivot transfer with min assist consistently. PT Short Term Goal 2 (Week 1): Pt will ambulate 138f with RW and min assist PT Short Term Goal 3 (Week 1): Pt will ascend/descend 4 steps with min assist and BUE support  Skilled Therapeutic Interventions/Progress Updates: Pt presentormed supine to sit with CGA and increased time with use of bed features. Performed STS from EOB CGA and performed ambulatory transfer to toilet. Pt was able to perform LB clothing management with supervision (+urinary void). Pt then ambulated to sink to perform hand hygiene in standing with close S. Pt transferred to w/c and transported to rehab gym for energy conservation. Pt ambulated 560fx 4 with ace bandage for DF assist. Pt noted to ambulate with uneven step length and near step to pattern with LLE. Pt also required facilitation at hips for increased wt shift to R.? Due to edema pt unable to complete full knee extension. Performed hamstring/calf stretch standing on red wedge 2 x 1 min. Performed additional 5563f 2 with minimal change in knee extension. Pt then participated in standing balance/NMR reaching/placing clothespins on basketball net for forced use of RUE with red and green clothespins. On second round net placed further to R to emphasize wt shift to R. Pt  Performed thrid round with RW and LLE on 4 in step with R lateral bias. Once completed pt performed ambulatory transfer to w/c in same manner as prior and propelled and additional [id number] supervision and min cues for increased use of RUE. Pt transported remaining distance and and performed ambulatory transfer to bed. Pt required minA sit to supine  for RLE management. Pt left in bed  With bed alarm on, call bell within reach and current needs met.   Tx 2: Pt presented in bed agreeable to therapy. Pt indicated pain R hemi body 5/10 no additional intervention needed. Pt performed supine to sit with supervision and use of bed features. PTA donned ace bandage and pt ambulated [id number] RW and minA. Pt continues to require cues for increasing L step length and wt shift to R. Pt transported remaining distance to day room. Performed ambulatory transfer to NuStep with RW and CGA. Participated in NuStep L4 x 8 min for general conditioning and cardiovascular endurance. Pt indicated 6/10 on mBorg after activity. Pt returned to w/c in same manner as prior and transported back to room. Performed ambulatory transfer to bed and required minA sit to supine for RLE management. Pt left in bed at end of session with bed alarm on, call bell within reach and needs met.      Therapy Documentation Precautions:  Precautions Precautions: Fall Precaution Comments: foot drop on RLE Restrictions Weight Bearing Restrictions: No General:   Vital Signs: Therapy Vitals Temp: 98.2 F (36.8 C) Pulse Rate: (!) 113 Resp: 18 BP: (!) 146/71 Patient Position (if appropriate): Lying Oxygen Therapy SpO2: 92 % O2 Device: Room Air Pain: Pain Assessment Pain Scale: 0-10 Pain Score: 7  Pain Type: Acute pain Pain Location: Generalized Pain Orientation: Right Pain Descriptors / Indicators: Aching;Burning Pain Onset: Gradual Pain Intervention(s): Rest (oxycodone not due yet)   Therapy/Group: Individual Therapy  Christiane Sistare  Carlie Solorzano  , PTA  11/13/2019, 3:52 PM  

## 2019-11-14 ENCOUNTER — Inpatient Hospital Stay (HOSPITAL_COMMUNITY): Payer: BC Managed Care – PPO

## 2019-11-14 ENCOUNTER — Inpatient Hospital Stay (HOSPITAL_COMMUNITY): Payer: BC Managed Care – PPO | Admitting: Occupational Therapy

## 2019-11-14 ENCOUNTER — Inpatient Hospital Stay (HOSPITAL_COMMUNITY): Payer: BC Managed Care – PPO | Admitting: Physical Therapy

## 2019-11-14 LAB — VITAMIN D 25 HYDROXY (VIT D DEFICIENCY, FRACTURES): Vit D, 25-Hydroxy: 8.79 ng/mL — ABNORMAL LOW (ref 30–100)

## 2019-11-14 LAB — CK: Total CK: 428 U/L — ABNORMAL HIGH (ref 49–397)

## 2019-11-14 MED ORDER — METOPROLOL TARTRATE 12.5 MG HALF TABLET
12.5000 mg | ORAL_TABLET | Freq: Two times a day (BID) | ORAL | Status: DC
  Administered 2019-11-14 – 2019-11-22 (×14): 12.5 mg via ORAL
  Filled 2019-11-14 (×16): qty 1

## 2019-11-14 MED ORDER — SENNOSIDES-DOCUSATE SODIUM 8.6-50 MG PO TABS
2.0000 | ORAL_TABLET | Freq: Two times a day (BID) | ORAL | Status: DC
  Administered 2019-11-14 – 2019-11-16 (×5): 2 via ORAL
  Filled 2019-11-14 (×5): qty 2

## 2019-11-14 NOTE — Progress Notes (Signed)
Physical Therapy Session Note  Patient Details  Name: [name] MRN: [id number] Date of Birth: November 04, 1970  Today's Date: 11/14/2019 PT Individual Time: 1101-1204 PT Individual Time Calculation (min): 63 min   Short Term Goals: Week 1:  PT Short Term Goal 1 (Week 1): Pt will perfom stand pivot transfer with min assist consistently. PT Short Term Goal 2 (Week 1): Pt will ambulate 168f with RW and min assist PT Short Term Goal 3 (Week 1): Pt will ascend/descend 4 steps with min assist and BUE support  Skilled Therapeutic Interventions/Progress Updates: Pt presented in recliner agreeable to therapy. Pt states pain R hemi pain 6/10 RLE>RUE, rest breaks provided as needed throughout session. PTA donned ace bandage for DF assist during session. Pt performed STS from recliner CGA with decreased wt bearing on RLE and performed ambulatory transfer to w/c CGA with RW. Pt propelled 1136fwith supervision and increased time for general conditioning and forced use of RUE. Pt transported remaining distance to ortho gym for energy conservation. Pt participated in DyEvansburgor dyanmic balance for R NMR. Participated in mode A 2 2 minute bouts: first trial BUE with reaction time of 1.74 sec with slowest reaction in LUQ at 2.31sec. Second bout with RUE only and reaction time of 1.69 sec with slowest reaction LUQ at 1.78 sec. Pt then ambulated [id number] rehab gym with RW and CGA. Pt continues to demosntrate uneven step length with decreased stance time oh RLE and LLE shortened step length. Pt was able to intermittently increase L step length however. Pt then transferred to mat and participated in supine activities for forced use of RLE. Performed heel slides, AA SLR, SAQ 2 x 10, bridges and hip adduction (ball squeeze) x 10. Pt required frequent breaks due to fatigue and indicated increase in pain 7/10. Once completed performed supine to sit supervision and stand pivot transfer to w/c with RW CGA. Pt transported back  to room for time management and performed ambulatory transfer to bed CGA. Pt required minA for sit to supine for RLE management and was able to reposition to comfort. Pt left in bed at end of session with bed alarm on, call bell within reach and pillow propping R hemi body for edema management.       Therapy Documentation Precautions:  Precautions Precautions: Fall Precaution Comments: foot drop on RLE Restrictions Weight Bearing Restrictions: No General:   Vital Signs:    Therapy/Group: Individual Therapy  Jaquel Coomer  Oona Trammel, PTA  11/14/2019, 12:49 PM

## 2019-11-14 NOTE — Progress Notes (Signed)
Patient ID: [name], male   DOB: May 02, 1970, 49 y.o.   MRN: [id number] S: No new complaints.  Thinks he started to make more urine but none documented. O:BP (!) 142/69 (BP Location: Left Arm)   Pulse (!) 106   Temp 98.9 F (37.2 C) (Oral)   Resp 16   Ht 5' 11"  (1.803 m)   Wt 101.7 kg   SpO2 93%   BMI 31.27 kg/m   Intake/Output Summary (Last 24 hours) at 11/14/2019 1231 Last data filed at 11/14/2019 0521 Gross per 24 hour  Intake 322 ml  Output 0 ml  Net 322 ml   Intake/Output: I/O last 3 completed shifts: In: 440 [P.O.:440] Out: 0   Intake/Output this shift:  No intake/output data recorded. Weight change: -4.2 kg Gen: NAD CVS: tachy at 106 Resp: cta Abd: +BS, soft, NT/Nd Ext: 1+ edema of all ext but 2 + on RUE.  Recent Labs  Lab 11/08/19 0608 11/09/19 0436 11/10/19 0451 11/12/19 1132 11/13/19 0636  NA 133* 132* 134* 134* 134*  K 4.0 4.2 4.0 4.4 5.3*  CL 99 98 97* 96* 97*  CO2 25 24 25 26 27   GLUCOSE 107* 106* 106* 120* 116*  BUN 54* 74* 47* 57* 55*  CREATININE 8.48* 10.43* 7.98* 8.45* 8.80*  ALBUMIN 2.0*  --  2.0* 2.4* 2.2*  CALCIUM 7.9* 8.0* 8.4* 11.5* 13.8*  PHOS  --   --   --  7.0* 9.6*  AST 57*  --  41  --   --   ALT 32  --  24  --   --    Liver Function Tests: Recent Labs  Lab 11/08/19 0608 11/08/19 0608 11/10/19 0451 11/12/19 1132 11/13/19 0636  AST 57*  --  41  --   --   ALT 32  --  24  --   --   ALKPHOS 42  --  42  --   --   BILITOT 0.6  --  0.4  --   --   PROT 4.3*  --  4.4*  --   --   ALBUMIN 2.0*   < > 2.0* 2.4* 2.2*   < > = values in this interval not displayed.   No results for input(s): LIPASE, AMYLASE in the last 168 hours. No results for input(s): AMMONIA in the last 168 hours. CBC: Recent Labs  Lab 11/08/19 0608 11/08/19 0608 11/09/19 0436 11/09/19 0436 11/10/19 0451 11/12/19 0957 11/13/19 0636  WBC 13.8*   < > 12.6*   < > 11.9* 15.8* 10.8*  NEUTROABS 10.1*   < > 9.5*  --  8.7*  --  7.6  HGB 9.0*   < > 9.1*   < >  8.1* 9.7* 8.6*  HCT 28.0*   < > 27.9*   < > 25.8* 31.1* 27.9*  MCV 93.0  --  93.9  --  94.9 94.8 96.5  PLT 279   < > 318   < > 315 447* 360   < > = values in this interval not displayed.   Cardiac Enzymes: Recent Labs  Lab 11/08/19 0608 11/09/19 0436 11/14/19 0642  CKTOTAL 2,968* 2,456* 428*   CBG: Recent Labs  Lab 11/08/19 1719  GLUCAP 108*    Iron Studies: No results for input(s): IRON, TIBC, TRANSFERRIN, FERRITIN in the last 72 hours. Studies/Results: No results found. Marland Kitchen aspirin EC  81 mg Oral Daily  . Chlorhexidine Gluconate Cloth  6 each Topical Daily  . Chlorhexidine Gluconate  Cloth  6 each Topical Q0600  . gabapentin  100 mg Oral TID  . heparin  5,000 Units Subcutaneous Q8H  . metoprolol tartrate  12.5 mg Oral BID  . polyethylene glycol  17 g Oral BID  . senna-docusate  2 tablet Oral BID    BMET    Component Value Date/Time   NA 134 (L) 11/13/2019 0636   K 5.3 (H) 11/13/2019 0636   CL 97 (L) 11/13/2019 0636   CO2 27 11/13/2019 0636   GLUCOSE 116 (H) 11/13/2019 0636   BUN 55 (H) 11/13/2019 0636   CREATININE 8.80 (H) 11/13/2019 0636   CALCIUM 13.8 (HH) 11/13/2019 0636   GFRNONAA 6 (L) 11/13/2019 0636   GFRAA 7 (L) 11/13/2019 0636   CBC    Component Value Date/Time   WBC 10.8 (H) 11/13/2019 0636   RBC 2.89 (L) 11/13/2019 0636   HGB 8.6 (L) 11/13/2019 0636   HCT 27.9 (L) 11/13/2019 0636   PLT 360 11/13/2019 0636   MCV 96.5 11/13/2019 0636   MCH 29.8 11/13/2019 0636   MCHC 30.8 11/13/2019 0636   RDW 14.3 11/13/2019 0636   LYMPHSABS 1.4 11/13/2019 0636   MONOABS 1.0 11/13/2019 0636   EOSABS 0.2 11/13/2019 0636   BASOSABS 0.1 11/13/2019 0636   HPI: 49 yo with h/o Crohn's disease, depression, found down at home after drinking alcohol and + cocaine, In the ED he was noted to have low BP's, Cr 5.9, K >7, MRI with watershed pattern of infarct, CK level >50,000, AST 4,337, ALT 1851, WBC 43.5, Hgb 19.3, plt 276.  Assessment/Plan:  1. AKIpresumably  due to ischemic ATN from hypotension/hypovolemia as well as rhabdomyolysis, +/- cocaine. S/p TDC on 10/31/19 and started on HD 10/31/19 and has remained oliguric/anuric and dialysis dependent. 1. Plan for HD today and keep on MWF schedule for now. 2. Raliegh Ip and BUN, will use low K bath and increase DFR and 4 hour treatments 2. Rhabdomyolysis- improving with HD 3. Hypercalcemia- have stopped phoslo and tums.  Will check iPTH and vit D levels and use low Ca dialysate tomorrow. 4. Hyperkalemia- will use low K bath with HD.  Dose lokelma x 1 yesterday. 5. Drug abuse- per primary 6. H/o Crohn's- stable on remicade 7. Anemia of critical illness and AKI. S/p IV iron and aranesp 40 mcg. Follow h/h. 8. Deconditioning- now in CIR.  [name], MD [employer] [phone]

## 2019-11-14 NOTE — Patient Care Conference (Signed)
Inpatient RehabilitationTeam Conference and Plan of Care Update Date: 11/14/2019   Time: 10:18 AM    Patient Name: [name]      Medical Record Number: [id number]  Date of Birth: 1970/08/19 Sex: Male         Room/Bed: 4W02C/4W02C-01 Payor Info: Payor: [place] / Plan: [employer] COMM PPO / Product Type: *No Product type* /    Admit Date/Time:  11/09/2019  4:04 PM  Primary Diagnosis:  Rhabdomyolysis  Hospital Problems: Principal Problem:   Rhabdomyolysis Active Problems:   Embolic stroke ([place])   Anoxic brain injury [facility])    Expected Discharge Date: Expected Discharge Date: 11/21/19  Team Members Present: Physician leading conference: Dr. Alysia Penna Care Coodinator Present: Dorien Chihuahua, RN, BSN, CRRN;Christina Sampson Goon, [place] Nurse Present: Benjie Karvonen, RN PT Present: Phylliss Bob, PTA OT Present: Turner Daniels, OT SLP Present: Charolett Bumpers, SLP PPS Coordinator present : Gunnar Fusi, SLP     Current Status/Progress Goal Weekly Team Focus  Bowel/Bladder   Pt is continent of bowel/bladder. He is oliguric. Q8 BS, cath >250.  To remain continent.  Assess tolieting needs prn, BS q8 per order.   Swallow/Nutrition/ Hydration             ADL's   Min A bathing, supervision UB dressing, Max A LB dressing, Mod A toileting + toilet transfers  [place], functional transfers, ADL retraining, dynamic balance   Mobility   minA sit to supine due to RLE weakness, CGA transfers, minA gait up to 159f with ace bandage for DF assist, supervision w/c mobilty  mod I bed mobility, transfers, and w/c mobility. Supervision gait  NMR, balance, RLE strengthening, d/c planning   Communication             Safety/Cognition/ Behavioral Observations  Supervision A  Mod I  Complex problem solving, alternating attention and novel recall   Pain   Complains of pain right arm, right leg (Tingling, numbness, and aching.) Usually 7/10. Gets prn oxy q6 if  needed.  To bring pain level down to 2/10.  Assess pain q shift or prn.   Skin   Has abrasion to right cheek OTA, and ecchymosis to bilateral sides of abdomen.  To promote healing and preventing breakdown from occurring.  Assess skin q shift or prn.     Discharge Planning:  Goal to discharge MOD I home with mom. mom works 12 hrs 3 days a way, step daughter will be able to asisst mother with supervision. 1 level home, 3 steps to enter   Team Discussion: Constipation addressed. HD continued at present although patient is voiding some on his own. Pain in right hand is managed with prn medication. Note flat affect and little interaction. Note issues with weight shifting onto the right side with edema of right UE/LE. Right foot dorsiflexion issues.  Patient on target to meet rehab goals: yes, MOD I goals set. Currently CGA with ADLs and Min assist-CGA for transfers and ambulates with CGA-Min assist with supervision goals for ambulation. Currently supervision level for problem solving , alternating attention, recall and deductive reasoning.  *See Care Plan and progress notes for long and short-term goals.   Revisions to Treatment Plan:   Teaching Needs: Transfers, medications, etc.  Current Barriers to Discharge: Decreased caregiver support, access/layout of home environment as wife works and will provide intermittent assistance and has 3 steps to entry of the home.  Hemodialysis  Possible Resolutions to Barriers: Family education Clip  for HD after discharge    Medical Summary Current Status: Rhabdo causing swelling RUE and RLE, Constipation  Barriers to Discharge: Medical stability  Barriers to Discharge Comments: tachycardia Possible Resolutions to Barriers/Weekly Focus: add BB, manage laxatives   Continued Need for Acute Rehabilitation Level of Care: The patient requires daily medical management by a physician with specialized training in physical medicine and rehabilitation for the  following reasons: Direction of a multidisciplinary physical rehabilitation program to maximize functional independence : Yes Medical management of patient stability for increased activity during participation in an intensive rehabilitation regime.: Yes Analysis of laboratory values and/or radiology reports with any subsequent need for medication adjustment and/or medical intervention. : Yes   I attest that I was present, lead the team conference, and concur with the assessment and plan of the team.   Dorien Chihuahua B 11/14/2019, 2:26 PM

## 2019-11-14 NOTE — Progress Notes (Signed)
[facility] PHYSICAL MEDICINE & REHABILITATION PROGRESS NOTE   Subjective/Complaints: No BM x 2 d, taking Miralax BID per pt Discussed etiology of RUE swelling, rhabdo rather than CVA related  Review of systems:  Pt denies SOB, abd pain, CP, N/V/D   Objective:   No results found. Recent Labs    11/12/19 0957 11/13/19 0636  WBC 15.8* 10.8*  HGB 9.7* 8.6*  HCT 31.1* 27.9*  PLT 447* 360   Recent Labs    11/12/19 1132 11/13/19 0636  NA 134* 134*  K 4.4 5.3*  CL 96* 97*  CO2 26 27  GLUCOSE 120* 116*  BUN 57* 55*  CREATININE 8.45* 8.80*  CALCIUM 11.5* 13.8*    Intake/Output Summary (Last 24 hours) at 11/14/2019 0755 Last data filed at 11/14/2019 0521 Gross per 24 hour  Intake 440 ml  Output 0 ml  Net 440 ml        Physical Exam: Vital Signs Blood pressure (!) 142/69, pulse (!) 106, temperature 98.9 F (37.2 C), temperature source Oral, resp. rate 16, height _0  (1.803 m), weight 101.7 kg, SpO2 93 %.  General: No acute distress Mood and affect are appropriate Heart: Regular rate and rhythm no rubs murmurs or extra sounds Lungs: Clear to auscultation, breathing unlabored, no rales or wheezes Abdomen: Positive bowel sounds, soft nontender to palpation, nondistended Extremities: No clubbing, cyanosis, or edema Skin: No evidence of breakdown, no evidence of rash  , motor strength is 5/5 in left deltoid, bicep, tricep, grip, hip flexor, knee extensors, ankle dorsiflexor and plantar flexor 3/5 in the right deltoid bicep tricep grip, 0/5 in the right hip flexor knee extensor trace ankle dorsiflexor plantar flexor Sensory exam normal sensation to light touch  bilateral upper and left lower extremities, right lower extremity no sensation below the ankle Musculoskeletal: Full range of motion in all 4 extremities. No joint swelling  Assessment/Plan: 1. Functional deficits secondary to anoxic strokes with rhabdomyolysis right upper and right lower limb causing weakness  which require 3+ hours per day of interdisciplinary therapy in a comprehensive inpatient rehab setting.  Physiatrist is providing close team supervision and 24 hour management of active medical problems listed below.  Physiatrist and rehab team continue to assess barriers to discharge/monitor patient progress toward functional and medical goals  Care Tool:  Bathing    Body parts bathed by patient: Right arm, Left arm, Chest, Abdomen, Front perineal area, Right upper leg, Left upper leg, Face, Buttocks   Body parts bathed by helper: Buttocks, Right lower leg, Left lower leg     Bathing assist Assist Level: Minimal Assistance - Patient > 75%     Upper Body Dressing/Undressing Upper body dressing   What is the patient wearing?: Pull over shirt    Upper body assist Assist Level: Supervision/Verbal cueing    Lower Body Dressing/Undressing Lower body dressing      What is the patient wearing?: Pants     Lower body assist Assist for lower body dressing: Moderate Assistance - Patient 50 - 74%     Toileting Toileting    Toileting assist Assist for toileting: Moderate Assistance - Patient 50 - 74%     Transfers Chair/bed transfer  Transfers assist     Chair/bed transfer assist level: Contact Guard/Touching assist     Locomotion Ambulation   Ambulation assist      Assist level: Minimal Assistance - Patient > 75% Assistive device: Walker-rolling Max distance: 55   Walk 10 feet activity   Assist  Assist level: Contact Guard/Touching assist Assistive device: Walker-rolling   Walk 50 feet activity   Assist Walk 50 feet with 2 turns activity did not occur: Safety/medical concerns  Assist level: Contact Guard/Touching assist Assistive device: Walker-rolling    Walk 150 feet activity   Assist Walk 150 feet activity did not occur: Safety/medical concerns         Walk 10 feet on uneven surface  activity   Assist Walk 10 feet on uneven  surfaces activity did not occur: Safety/medical concerns         Wheelchair     Assist Will patient use wheelchair at discharge?: Yes (Per PT goals ) Type of Wheelchair: Manual    Wheelchair assist level: Minimal Assistance - Patient > 75% Max wheelchair distance: 150    Wheelchair 50 feet with 2 turns activity    Assist        Assist Level: Minimal Assistance - Patient > 75%   Wheelchair 150 feet activity     Assist      Assist Level: Minimal Assistance - Patient > 75%   Blood pressure (!) 142/69, pulse (!) 106, temperature 98.9 F (37.2 C), temperature source Oral, resp. rate 16, height _0  (1.803 m), weight 101.7 kg, SpO2 93 %.  Medical Problem List and Plan: 1.Right hemiparesis and generalized weaknesssecondary to rhabdomyolysis with neuropraxic peripheral nerve injury. Pt also with punctate occipital lobe and cerebellar infarcts as well as globi pallidi insults----potentially d/t anoxic injury -patient may shower -ELOS/Goals: 7-10 days Team conference today please see physician documentation under team conference tab, met with team  to discuss problems,progress, and goals. Formulized individual treatment plan based on medical history, underlying problem and comorbidities.  2. Antithrombotics: -DVT/anticoagulation:Pharmaceutical:Heparin -antiplatelet therapy: 51m daily 3. Pain Management:D/c dilaudid--has been using 2.5 mg/day--will transition to oxycodone prn.  Patient is doing well on current medications. Pain is well controlled 9/21 -will add gabapentin for neuropathic pain RLE 4. Mood:LCSW to follow for evaluation and support. -antipsychotic agents: N/A 5. Neuropsych: This patientiscapable of making decisions on hisown behalf. 6. Skin/Wound Care:Routine pressure relief measures, local wound care asneeded. 7. Fluids/Electrolytes/Nutrition:Strict I/O. Check lytes in am.   8. AKI with rhabdomyolysis: CK- 2456 slowly coming down. HD dependent MWF- CLIP pending.  9. Crohn's disease: Remicade on hold.  10. Abnormal LFTs: Resolving with AST 9137-->57. Recheck LFTs in am.  11. Polysubstance abuse: Has completed CIWA protocol.  12. RUE/RLEweakness withedema and sensory loss,neuropraxia: -PROM/AROM, edema control -outpt EMG/NCS potentially 13. Leucocytosis: Trending downward.  14. Anemia:Supplement with IV iron X 3 and Aranesp ordered. 15.Hypocalcemia: Treated with multiple doses IV calcium gluconate--now on CaCO3 bid.  9/21: hypercalcemic today- nephro contacted.   16.  Constipation: ongoing issues no BM x 2 d , only had BM after supp, denied problems at home add senna  LOS: 5 days A FACE TO FACE EVALUATION WAS PERFORMED  ACharlett Blake9/22/2021, 7:55 AM

## 2019-11-14 NOTE — Progress Notes (Signed)
   11/14/19 1935  Assess: MEWS Score  Temp 98.5 F (36.9 C)  BP 108/66  Pulse Rate (!) 121  Resp 18  SpO2 97 %  O2 Device Room Air  Assess: if the MEWS score is Yellow or Red  Were vital signs taken at a resting state? Yes  Focused Assessment No change from prior assessment  Early Detection of Sepsis Score *See Row Information* Low  MEWS guidelines implemented *See Row Information* Yes  Treat  MEWS Interventions Administered scheduled meds/treatments  Pain Scale 0-10  Pain Score 0  Notify: Charge Nurse/RN  Name of Charge Nurse/RN Notified Jeanie Cooks, RN  Date Charge Nurse/RN Notified 11/14/19  Time Charge Nurse/RN Notified 1930  Notify: Provider  Provider Name/Title Marlowe Shores  Date Provider Notified 11/14/19  Time Provider Notified 1948  Notification Type Call  Notification Reason Change in status  Response No new orders (adminster scheduled meds and monitor)  Date of Provider Response 11/14/19  Time of Provider Response 1948  Document  Patient Outcome Other (Comment) (reporting to oncoming staff)  Progress note created (see row info) Yes

## 2019-11-14 NOTE — Progress Notes (Signed)
Occupational Therapy Session Note  Patient Details  Name: [name] MRN: [id number] Date of Birth: 1970/03/12  Today's Date: 11/14/2019 OT Individual Time: 9824-2998 OT Individual Time Calculation (min): 75 min    Short Term Goals: Week 1:  OT Short Term Goal 1 (Week 1): Pt will stand to groom for 2 items to show improved endurance wiht CGA OT Short Term Goal 2 (Week 1): Pt will demo edema management techniques with no VC OT Short Term Goal 3 (Week 1): Pt will don shirt wiht set up OT Short Term Goal 4 (Week 1): Pt will thread BLE into pants wiht AE PRN  Skilled Therapeutic Interventions/Progress Updates:  Patient met lying in supine in agreement with OT treatment session with focus on self-care re-education, RUE NMR, and activity tolerance as detailed below. Supine to EOB with HOB flat and Min A at trunk with cues for technique. Sit to stand from EOB with CGA. Continued presence of swelling in RUE/RLE and scrotum. Discussed with MD who suggested retrograde massage and wrapping may not be beneficial as swelling is intramuscular. Mild pain in R thigh with standing. Seated at sink level, patient performed 2/3 grooming tasks with independence. Toilet transfer to [name] over standard toilet with CGA and toileting/hygiene/clothing management with Min A. Wc mobility to and from therapy gym with multiple rest breaks 2/2 fatigue. Static standing tolerance for 3 trials with incorporation of functional activity. Patient able to tolerate 3-4 minutes before requiring rest break. Sit to supine on mat table with Min A and supine to side-lying with GCA. RUE NMR in gravity minimized plane in supine with weighted dowel to improve RUE function in prep for BADLs. Session concluded with patient seated in recliner with call bell within reach and all needs met.   Therapy Documentation Precautions:  Precautions Precautions: Fall Precaution Comments: foot drop on RLE Restrictions Weight Bearing Restrictions:  No General:    Therapy/Group: Individual Therapy  Ahnna Dungan R Howerton-Davis 11/14/2019, 7:26 AM

## 2019-11-14 NOTE — Progress Notes (Signed)
Speech Language Pathology Daily Session Note  Patient Details  Name: [name] MRN: [id number] Date of Birth: 1970-11-29  Today's Date: 11/14/2019 SLP Individual Time: 1000-1055 SLP Individual Time Calculation (min): 55 min  Short Term Goals: Week 1: SLP Short Term Goal 1 (Week 1): Pt will complete complex tasks with mod I functional problem solving. SLP Short Term Goal 2 (Week 1): Pt will recall new, complex information with mod I use of compensatory strategies. SLP Short Term Goal 3 (Week 1): Pt will alternate his attention between 2 mildly complex tasks with mod I.  Skilled Therapeutic Interventions: Skilled SLP intervention focused on cognition. Max A with verbal and visual cueing required for higher level scheduling task. Visual cues given to locate specific relevant information written in paragraph to compete schedule. Pt given max A with verbal cues to reason less obvious information to place tasks in correct time slots. Calendar planning task completed with pt answering questions about schedule events written in monthly calendar. Pt made multiple minor mistakes and needed visual cue to identify error in dates and information he wrote for answers. Pt left seated upright in chair with chair alarm set and call bell within reach. Cont with therapy per plan of care.      Pain Pain Assessment Pain Scale: Faces Pain Score: 7  Faces Pain Scale: No hurt Pain Type: Acute pain Pain Location: Hand Pain Orientation: Right Pain Descriptors / Indicators: Aching Pain Frequency: Intermittent Pain Onset: With Activity Pain Intervention(s): Medication (See eMAR)  Therapy/Group: Individual Therapy  [name] 11/14/2019, 2:15 PM

## 2019-11-15 ENCOUNTER — Inpatient Hospital Stay (HOSPITAL_COMMUNITY): Payer: BC Managed Care – PPO | Admitting: Occupational Therapy

## 2019-11-15 ENCOUNTER — Inpatient Hospital Stay (HOSPITAL_COMMUNITY): Payer: BC Managed Care – PPO | Admitting: Physical Therapy

## 2019-11-15 LAB — RENAL FUNCTION PANEL
Albumin: 2.2 g/dL — ABNORMAL LOW (ref 3.5–5.0)
Anion gap: 13 (ref 5–15)
BUN: 39 mg/dL — ABNORMAL HIGH (ref 6–20)
CO2: 24 mmol/L (ref 22–32)
Calcium: 12.2 mg/dL — ABNORMAL HIGH (ref 8.9–10.3)
Chloride: 97 mmol/L — ABNORMAL LOW (ref 98–111)
Creatinine, Ser: 7.19 mg/dL — ABNORMAL HIGH (ref 0.61–1.24)
GFR calc Af Amer: 9 mL/min — ABNORMAL LOW (ref >60)
GFR calc non Af Amer: 8 mL/min — ABNORMAL LOW (ref >60)
Glucose, Bld: 103 mg/dL — ABNORMAL HIGH (ref 70–99)
Phosphorus: 7.5 mg/dL — ABNORMAL HIGH (ref 2.5–4.6)
Potassium: 4.3 mmol/L (ref 3.5–5.1)
Sodium: 134 mmol/L — ABNORMAL LOW (ref 135–145)

## 2019-11-15 LAB — GLUCOSE, CAPILLARY
Glucose-Capillary: 87 mg/dL (ref 70–99)
Glucose-Capillary: 96 mg/dL (ref 70–99)

## 2019-11-15 LAB — PARATHYROID HORMONE, INTACT (NO CA): PTH: 8 pg/mL — ABNORMAL LOW (ref 15–65)

## 2019-11-15 NOTE — Progress Notes (Signed)
Physical Therapy Session Note  Patient Details  Name: [name] MRN: [id number] Date of Birth: [birth year]  Today's Date: 11/15/2019 PT Individual Time: 0928-1027 PT Individual Time Calculation (min): 59 min   Short Term Goals: Week 1:  PT Short Term Goal 1 (Week 1): Pt will perfom stand pivot transfer with min assist consistently. PT Short Term Goal 2 (Week 1): Pt will ambulate 153f with RW and min assist PT Short Term Goal 3 (Week 1): Pt will ascend/descend 4 steps with min assist and BUE support  Skilled Therapeutic Interventions/Progress Updates:    [facility]. [facility], pt directed in static stretching in seated position of RLE for hamstring and quad for improved standing tolerance and mobility with gait, 3x40s each. Pt directed in 2x5 [facility] min A initially and CGA post rep 2 with VC for hand placement. Pt directed in gait training with FWW for 75', 75' and 150' with seated rest breaks between, grossly min A-CGA with VC for step continuity, increased step height on R and step length on L for overall gait pattern. Pt directed in standing activity in parallel bars with 2x15 cone taps with BLE alternating, 2x10 squats with tactile and VC for increased WB on RLE, 2x10 weight shifts to RLE progressing to single leg stance on RLE with BUE support. Pt returned to room 50' min A WC mobility then total A for rest of distance for time and pt's fatigue. Pt directed in [place] to [place] step transfer to bedside, sit>supine all at CS[facility]grossly. Pt left in bed, alarm set, All needs in reach and in good condition. Call light in hand.    Therapy Documentation Precautions:  Precautions Precautions: Fall Precaution Comments: foot drop on RLE Restrictions Weight Bearing Restrictions: No    Vital Signs: Therapy Vitals Pulse Rate: 99 Resp: 16 BP: (!) 96/59 Patient Position (if appropriate): Sitting Oxygen Therapy SpO2: 94 % O2  Device: Room Air    Therapy/Group: Individual Therapy  [name]9/23/2021, 12:13 PM

## 2019-11-15 NOTE — Progress Notes (Signed)
Patient ID: CARLA WHILDEN, male   DOB: 10-May-1970, 49 y.o.   MRN: [id number] S: No new complaints. O:BP (!) 96/59 (BP Location: Left Arm)   Pulse 99   Temp 98.5 F (36.9 C) (Oral)   Resp 16   Ht 5' 11"  (1.803 m)   Wt 102.5 kg   SpO2 94%   BMI 31.52 kg/m   Intake/Output Summary (Last 24 hours) at 11/15/2019 1223 Last data filed at 11/15/2019 0825 Gross per 24 hour  Intake 358 ml  Output 3000 ml  Net -2642 ml   Intake/Output: I/O last 3 completed shifts: In: 240 [P.O.:240] Out: 3000 [Other:3000]  Intake/Output this shift:  Total I/O In: 118 [P.O.:118] Out: -  Weight change: 0 kg Gen: NAD CVS: no rub Resp: cta Abd: +BS, soft, Nt/Nd Ext: 2+ edema  Recent Labs  Lab 11/09/19 0436 11/10/19 0451 11/12/19 1132 11/13/19 0636 11/15/19 1049  NA 132* 134* 134* 134* 134*  K 4.2 4.0 4.4 5.3* 4.3  CL 98 97* 96* 97* 97*  CO2 24 25 26 27 24   GLUCOSE 106* 106* 120* 116* 103*  BUN 74* 47* 57* 55* 39*  CREATININE 10.43* 7.98* 8.45* 8.80* 7.19*  ALBUMIN  --  2.0* 2.4* 2.2* 2.2*  CALCIUM 8.0* 8.4* 11.5* 13.8* 12.2*  PHOS  --   --  7.0* 9.6* 7.5*  AST  --  41  --   --   --   ALT  --  24  --   --   --    Liver Function Tests: Recent Labs  Lab 11/10/19 0451 11/10/19 0451 11/12/19 1132 11/13/19 0636 11/15/19 1049  AST 41  --   --   --   --   ALT 24  --   --   --   --   ALKPHOS 42  --   --   --   --   BILITOT 0.4  --   --   --   --   PROT 4.4*  --   --   --   --   ALBUMIN 2.0*   < > 2.4* 2.2* 2.2*   < > = values in this interval not displayed.   No results for input(s): LIPASE, AMYLASE in the last 168 hours. No results for input(s): AMMONIA in the last 168 hours. CBC: Recent Labs  Lab 11/09/19 0436 11/09/19 0436 11/10/19 0451 11/12/19 0957 11/13/19 0636  WBC 12.6*   < > 11.9* 15.8* 10.8*  NEUTROABS 9.5*  --  8.7*  --  7.6  HGB 9.1*   < > 8.1* 9.7* 8.6*  HCT 27.9*   < > 25.8* 31.1* 27.9*  MCV 93.9  --  94.9 94.8 96.5  PLT 318   < > 315 447* 360   < > = values in  this interval not displayed.   Cardiac Enzymes: Recent Labs  Lab 11/09/19 0436 11/14/19 0642  CKTOTAL 2,456* 428*   CBG: Recent Labs  Lab 11/08/19 1719 11/15/19 1111  GLUCAP 108* 87    Iron Studies: No results for input(s): IRON, TIBC, TRANSFERRIN, FERRITIN in the last 72 hours. Studies/Results: No results found. Marland Kitchen aspirin EC  81 mg Oral Daily  . Chlorhexidine Gluconate Cloth  6 each Topical Daily  . Chlorhexidine Gluconate Cloth  6 each Topical Q0600  . gabapentin  100 mg Oral TID  . heparin  5,000 Units Subcutaneous Q8H  . metoprolol tartrate  12.5 mg Oral BID  . polyethylene glycol  17 g  Oral BID  . senna-docusate  2 tablet Oral BID    BMET    Component Value Date/Time   NA 134 (L) 11/15/2019 1049   K 4.3 11/15/2019 1049   CL 97 (L) 11/15/2019 1049   CO2 24 11/15/2019 1049   GLUCOSE 103 (H) 11/15/2019 1049   BUN 39 (H) 11/15/2019 1049   CREATININE 7.19 (H) 11/15/2019 1049   CALCIUM 12.2 (H) 11/15/2019 1049   GFRNONAA 8 (L) 11/15/2019 1049   GFRAA 9 (L) 11/15/2019 1049   CBC    Component Value Date/Time   WBC 10.8 (H) 11/13/2019 0636   RBC 2.89 (L) 11/13/2019 0636   HGB 8.6 (L) 11/13/2019 0636   HCT 27.9 (L) 11/13/2019 0636   PLT 360 11/13/2019 0636   MCV 96.5 11/13/2019 0636   MCH 29.8 11/13/2019 0636   MCHC 30.8 11/13/2019 0636   RDW 14.3 11/13/2019 0636   LYMPHSABS 1.4 11/13/2019 0636   MONOABS 1.0 11/13/2019 0636   EOSABS 0.2 11/13/2019 0636   BASOSABS 0.1 11/13/2019 0636    HPI: 49 yo with h/o Crohn's disease, depression, found down at home after drinking alcohol and + cocaine, In the ED he was noted to have low BP's, Cr 5.9, K >7, MRI with watershed pattern of infarct, CK level >50,000, AST 4,337, ALT 1851, WBC 43.5, Hgb 19.3, plt 276.  Assessment/Plan:  1. AKIpresumably due to ischemic ATN from hypotension/hypovolemia as well as rhabdomyolysis, +/- cocaine. S/p TDC on 10/31/19 and started on HD 10/31/19 and has remained oliguric/anuric and  dialysis dependent. 1. Plan for HD today and keep on MWF schedule for now. 2. Raliegh Ip and BUN, will use low K bath and increase DFR and 4 hour treatments 2. Rhabdomyolysis- improving with HD 3. Hypercalcemia- have stopped phoslo and tums. Will check iPTH and vit D levels and use low Ca dialysate tomorrow. 4. Hyperkalemia- improved with HD 5. SHPTH- will start fosrenol for ^ phos. 6. Drug abuse- per primary 7. H/o Crohn's- stable on remicade 8. Anemia of critical illness and AKI. S/p IV iron and aranesp 40 mcg. Follow h/h. 9. Deconditioning- now in CIR.   [name], MD [employer] [phone]

## 2019-11-15 NOTE — Progress Notes (Signed)
Occupational Therapy Session Note  Patient Details  Name: [name] MRN: [id number] Date of Birth: 12-May-1970  Today's Date: 11/15/2019 OT Individual Time: 3295-1884 OT Individual Time Calculation (min): 71 min    Short Term Goals: Week 1:  OT Short Term Goal 1 (Week 1): Pt will stand to groom for 2 items to show improved endurance wiht CGA OT Short Term Goal 2 (Week 1): Pt will demo edema management techniques with no VC OT Short Term Goal 3 (Week 1): Pt will don shirt wiht set up OT Short Term Goal 4 (Week 1): Pt will thread BLE into pants wiht AE PRN  Skilled Therapeutic Interventions/Progress Updates:  Patient met lying supine in bed in agreement with OT treatment session with focus on self-care re-education, functional transfers, and activity tolerance. Functional mobility to [facility] over standard toilet in bathroom with CGA. Patient completed 3/3 parts of toileting task with CGA. Standing at sink level, patient able to wash hands and brush teeth without need for rest break indicating improved activity tolerance. Patient notes 7/10 pain in RLE. RN provided meds during session. Since patient continues to have RUE/RLE swelling, OT provided education on use of AE to increase independence and safety with LB bathing/dressing tasks. Per patient request, scrotal sling removed with patient noting decreased swelling. Patient able to thread BLE through underwear/pants with use of reacher and stand with CGA to hike over hips. Patient also able to doff/don footwear with use of reacher and sock-aid after instruction with set-up assist. Patient with request to complete breakfast meal before continuing therapy session. Wc mobility to dayroom in prep for there ex. Patient able to tolerate 15 minutes on level 9 of NuStep without rest break with focus on endurance in prep for BADLs and household/community mobility. Session concluded with patient seated in wc with call bell within reach, and all needs met.    Therapy Documentation Precautions:  Precautions Precautions: Fall Precaution Comments: foot drop on RLE Restrictions Weight Bearing Restrictions: No General:    Therapy/Group: Individual Therapy  [name] 11/15/2019, 7:21 AM

## 2019-11-15 NOTE — Progress Notes (Signed)
Patient ID: NOX TALENT, male   DOB: 02-05-1971, 49 y.o.   MRN: [id number]   [facility] unable to see patient for follow up, due to staffing.

## 2019-11-15 NOTE — Progress Notes (Signed)
[facility] PHYSICAL MEDICINE & REHABILITATION PROGRESS NOTE   Subjective/Complaints: Pt on Nustep, aware of d/c date discussed hand edema  Review of systems:  Pt denies SOB, abd pain, CP, N/V/D   Objective:   No results found. Recent Labs    11/12/19 0957 11/13/19 0636  WBC 15.8* 10.8*  HGB 9.7* 8.6*  HCT 31.1* 27.9*  PLT 447* 360   Recent Labs    11/12/19 1132 11/13/19 0636  NA 134* 134*  K 4.4 5.3*  CL 96* 97*  CO2 26 27  GLUCOSE 120* 116*  BUN 57* 55*  CREATININE 8.45* 8.80*  CALCIUM 11.5* 13.8*    Intake/Output Summary (Last 24 hours) at 11/15/2019 4315 Last data filed at 11/14/2019 1945 Gross per 24 hour  Intake 240 ml  Output 3000 ml  Net -2760 ml        Physical Exam: Vital Signs Blood pressure 131/72, pulse (!) 109, temperature 98.5 F (36.9 C), temperature source Oral, resp. rate 17, height 5' 11"  (1.803 m), weight 102.5 kg, SpO2 95 %.  General: No acute distress Mood and affect are appropriate Heart: Regular rate and rhythm no rubs murmurs or extra sounds Lungs: Clear to auscultation, breathing unlabored, no rales or wheezes Abdomen: Positive bowel sounds, soft nontender to palpation, nondistended Extremities: No clubbing, cyanosis, or 1+ edema dorsum of R hand  Skin: No evidence of breakdown, no evidence of rash  , motor strength is 5/5 in left deltoid, bicep, tricep, grip, hip flexor, knee extensors, ankle dorsiflexor and plantar flexor 3/5 in the right deltoid bicep tricep grip, 0/5 in the right hip flexor knee extensor trace ankle dorsiflexor plantar flexor Sensory exam normal sensation to light touch  bilateral upper and left lower extremities, right lower extremity no sensation below the ankle Musculoskeletal: Full range of motion in all 4 extremities. No joint swelling  Assessment/Plan: 1. Functional deficits secondary to anoxic strokes with rhabdomyolysis right upper and right lower limb causing weakness which require 3+ hours per day  of interdisciplinary therapy in a comprehensive inpatient rehab setting.  Physiatrist is providing close team supervision and 24 hour management of active medical problems listed below.  Physiatrist and rehab team continue to assess barriers to discharge/monitor patient progress toward functional and medical goals  Care Tool:  Bathing    Body parts bathed by patient: Right arm, Left arm, Chest, Abdomen, Front perineal area, Right upper leg, Left upper leg, Face, Buttocks   Body parts bathed by helper: Buttocks, Right lower leg, Left lower leg     Bathing assist Assist Level: Minimal Assistance - Patient > 75%     Upper Body Dressing/Undressing Upper body dressing   What is the patient wearing?: Pull over shirt    Upper body assist Assist Level: Supervision/Verbal cueing    Lower Body Dressing/Undressing Lower body dressing      What is the patient wearing?: Pants     Lower body assist Assist for lower body dressing: Moderate Assistance - Patient 50 - 74%     Toileting Toileting    Toileting assist Assist for toileting: Minimal Assistance - Patient > 75%     Transfers Chair/bed transfer  Transfers assist     Chair/bed transfer assist level: Contact Guard/Touching assist     Locomotion Ambulation   Ambulation assist      Assist level: Minimal Assistance - Patient > 75% Assistive device: Walker-rolling Max distance: 55   Walk 10 feet activity   Assist     Assist level:  Contact Guard/Touching assist Assistive device: Walker-rolling   Walk 50 feet activity   Assist Walk 50 feet with 2 turns activity did not occur: Safety/medical concerns  Assist level: Contact Guard/Touching assist Assistive device: Walker-rolling    Walk 150 feet activity   Assist Walk 150 feet activity did not occur: Safety/medical concerns         Walk 10 feet on uneven surface  activity   Assist Walk 10 feet on uneven surfaces activity did not occur:  Safety/medical concerns         Wheelchair     Assist Will patient use wheelchair at discharge?: Yes (Per PT goals ) Type of Wheelchair: Manual    Wheelchair assist level: Minimal Assistance - Patient > 75% Max wheelchair distance: 150    Wheelchair 50 feet with 2 turns activity    Assist        Assist Level: Minimal Assistance - Patient > 75%   Wheelchair 150 feet activity     Assist      Assist Level: Minimal Assistance - Patient > 75%   Blood pressure 131/72, pulse (!) 109, temperature 98.5 F (36.9 C), temperature source Oral, resp. rate 17, height 5' 11"  (1.803 m), weight 102.5 kg, SpO2 95 %.  Medical Problem List and Plan: 1.Right hemiparesis and generalized weaknesssecondary to rhabdomyolysis with neuropraxic peripheral nerve injury. Pt also with punctate occipital lobe and cerebellar infarcts as well as globi pallidi insults----potentially d/t anoxic injury -patient may shower -ELOS/Goals: 9/29 2. Antithrombotics: -DVT/anticoagulation:Pharmaceutical:Heparin -antiplatelet therapy: 67m daily 3. Pain Management:D/c dilaudid--has been using 2.5 mg/day--will transition to oxycodone prn.  Patient is doing well on current medications. Pain is well controlled 9/21- taking oxy IR 515mper day- do not plan to discharge home on opioid -will add gabapentin for neuropathic pain RLE 4. Mood:LCSW to follow for evaluation and support. -antipsychotic agents: N/A 5. Neuropsych: This patientiscapable of making decisions on hisown behalf. 6. Skin/Wound Care:Routine pressure relief measures, local wound care asneeded. Retrograde massage to R hand  7. Fluids/Electrolytes/Nutrition:Strict I/O. Check lytes in am.  8. AKI with rhabdomyolysis: CK- 2456 slowly coming down. HD dependent MWF- CLIP pending.  9. Crohn's disease: Remicade on hold.  10. Abnormal LFTs: Resolving with AST 9137-->57.  Recheck LFTs in am.  11. Polysubstance abuse: Has completed CIWA protocol.  12. RUE/RLEweakness withedema and sensory loss,neuropraxia: -PROM/AROM, edema control -outpt EMG/NCS potentially 13. Leucocytosis: Trending downward.  14. Anemia:Supplement with IV iron X 3 and Aranesp ordered. 15.Hypocalcemia: Treated with multiple doses IV calcium gluconate--now on CaCO3 bid.  9/21: hypercalcemic today- nephro contacted.   16.  Constipation: ongoing issues no BM x 2 d , only had BM after supp, denied problems at home add senna  LOS: 6 [place] 11/15/2019, 8:33 AM

## 2019-11-15 NOTE — Progress Notes (Signed)
Physical Therapy Session Note  Patient Details  Name: [name] MRN: [id number] Date of Birth: Mar 26, 1970  Today's Date: 11/15/2019 PT Individual Time: 1105-1200 PT Individual Time Calculation (min): 55 min   Short Term Goals: Week 1:  PT Short Term Goal 1 (Week 1): Pt will perfom stand pivot transfer with min assist consistently. PT Short Term Goal 2 (Week 1): Pt will ambulate 167f with RW and min assist PT Short Term Goal 3 (Week 1): Pt will ascend/descend 4 steps with min assist and BUE support  Skilled Therapeutic Interventions/Progress Updates:    Pt received supine in bed and agreeable to PT. Supine>sit transfer with supervision assist and cues for LE positioning once EOB.  Orthostatic BP assessment. Supine 123/64. Sitting 0 min 103/65, mild s/s. Sitting 3 min: 107/68 decreased s/s. Standing 0 min 112/68, decreased s/s of orthostatic hypotension. HR 105-115 throughout vitals assessment.   [facility] assist, pt unable to clear RLE from floor through transfer. Sit<>stand performed x 8 throughout session with supervion assist from PT throughout with cues fo LE placement.   WC mobility through hall with BUE propulsion technique x 1850f cues for turning technique and UE symmetry intermittently.   Gait training with RW without with DF wrap 2 x 10010fnd 120+100f11fspectively. Noted improvement in step height with DF wrap, but increased knee flexion in stance on the RLE. . Educated pt on DF bracing options for d/c with foot up brace vs AFO.    PT appied KT tape to the RUE to assist with edema management.   Pt returned to room and performed stand pivot transfer to bed with RW and supervision assist. Sit>supine completed with supervision assist and use of UE to control RLE by pt. Pt left supine in bed with call bell in reach and all needs met.       Therapy Documentation Precautions:  Precautions Precautions: Fall Precaution Comments: foot  drop on RLE Restrictions Weight Bearing Restrictions: No    Vital Signs: Therapy Vitals Pulse Rate: 99 Resp: 16 BP: (!) 96/59 Patient Position (if appropriate): Sitting Oxygen Therapy SpO2: 94 % O2 Device: Room Air Pain: Pain Assessment Pain Scale: 0-10 Pain Score: 6  Pain Location: Leg Pain Orientation: Right Pain Descriptors / Indicators: Aching Patients Stated Pain Goal: 4 Pain Intervention(s): Ambulation/increased activity Mobility:      Therapy/Group: Individual Therapy  AustLorie Phenix3/2021, 1:02 PM

## 2019-11-15 NOTE — Progress Notes (Signed)
Patient ID: [name], male   DOB: 03-Oct-1970, 49 y.o.   MRN: [id number]   Sw informed by Jaclyn Shaggy (Renal Navigator) per nephrology patient has permanent tunneled cath for OP HD.   [place], [place]

## 2019-11-15 NOTE — Progress Notes (Addendum)
Patient has been accepted at [facility] on a MWF schedule (continuation of hospital schedule) with a seat time of 12:40pm for ongoing HD treatment for AKI. He needs to arrive to his appointments at 12:20pm.  On his first day at the clinic, patient needs to arrive at 11:45am in order to complete intake paperwork prior to his first treatment.  Navigator has notified Nephrologist/Dr. Marval Regal, CIR CSW/C. Foster and met with patient to provide information verbally and in writing. Patient stated understanding and no questions.  Alphonzo Cruise, [place] Renal Navigator [phone]

## 2019-11-16 ENCOUNTER — Inpatient Hospital Stay (HOSPITAL_COMMUNITY): Payer: BC Managed Care – PPO

## 2019-11-16 ENCOUNTER — Inpatient Hospital Stay (HOSPITAL_COMMUNITY): Payer: BC Managed Care – PPO | Admitting: Occupational Therapy

## 2019-11-16 ENCOUNTER — Inpatient Hospital Stay (HOSPITAL_COMMUNITY): Payer: BC Managed Care – PPO | Admitting: Physical Therapy

## 2019-11-16 LAB — RENAL FUNCTION PANEL
Albumin: 2.3 g/dL — ABNORMAL LOW (ref 3.5–5.0)
Anion gap: 11 (ref 5–15)
BUN: 58 mg/dL — ABNORMAL HIGH (ref 6–20)
CO2: 26 mmol/L (ref 22–32)
Calcium: 11.9 mg/dL — ABNORMAL HIGH (ref 8.9–10.3)
Chloride: 95 mmol/L — ABNORMAL LOW (ref 98–111)
Creatinine, Ser: 9.72 mg/dL — ABNORMAL HIGH (ref 0.61–1.24)
GFR calc Af Amer: 7 mL/min — ABNORMAL LOW (ref >60)
GFR calc non Af Amer: 6 mL/min — ABNORMAL LOW (ref >60)
Glucose, Bld: 111 mg/dL — ABNORMAL HIGH (ref 70–99)
Phosphorus: 8.3 mg/dL — ABNORMAL HIGH (ref 2.5–4.6)
Potassium: 4.2 mmol/L (ref 3.5–5.1)
Sodium: 132 mmol/L — ABNORMAL LOW (ref 135–145)

## 2019-11-16 LAB — CBC
HCT: 23.7 % — ABNORMAL LOW (ref 39.0–52.0)
Hemoglobin: 7.4 g/dL — ABNORMAL LOW (ref 13.0–17.0)
MCH: 30 pg (ref 26.0–34.0)
MCHC: 31.2 g/dL (ref 30.0–36.0)
MCV: 96 fL (ref 80.0–100.0)
Platelets: 328 10*3/uL (ref 150–400)
RBC: 2.47 MIL/uL — ABNORMAL LOW (ref 4.22–5.81)
RDW: 13.3 % (ref 11.5–15.5)
WBC: 9.2 10*3/uL (ref 4.0–10.5)
nRBC: 0 % (ref 0.0–0.2)

## 2019-11-16 LAB — GLUCOSE, CAPILLARY
Glucose-Capillary: 100 mg/dL — ABNORMAL HIGH (ref 70–99)
Glucose-Capillary: 103 mg/dL — ABNORMAL HIGH (ref 70–99)
Glucose-Capillary: 110 mg/dL — ABNORMAL HIGH (ref 70–99)

## 2019-11-16 MED ORDER — SENNOSIDES-DOCUSATE SODIUM 8.6-50 MG PO TABS
3.0000 | ORAL_TABLET | Freq: Two times a day (BID) | ORAL | Status: DC
  Administered 2019-11-17 – 2019-11-22 (×9): 3 via ORAL
  Filled 2019-11-16 (×12): qty 3

## 2019-11-16 MED ORDER — HEPARIN SODIUM (PORCINE) 1000 UNIT/ML DIALYSIS
20.0000 [IU]/kg | INTRAMUSCULAR | Status: DC | PRN
  Administered 2019-11-16: 2000 [IU] via INTRAVENOUS_CENTRAL

## 2019-11-16 MED ORDER — HEPARIN SODIUM (PORCINE) 1000 UNIT/ML IJ SOLN
INTRAMUSCULAR | Status: AC
  Filled 2019-11-16: qty 4

## 2019-11-16 MED ORDER — HEPARIN SODIUM (PORCINE) 1000 UNIT/ML DIALYSIS: 20.0000 [IU]/kg | INTRAMUSCULAR | Status: DC | PRN

## 2019-11-16 NOTE — Progress Notes (Signed)
[place] PHYSICAL MEDICINE & REHABILITATION PROGRESS NOTE   Subjective/Complaints: Playing cards with SLP , no c/os Appreciate Nephro notes  Pt still feels a little numb entire R hand  Review of systems:  Pt denies SOB, abd pain, CP, N/V/D   Objective:   No results found. No results for input(s): WBC, HGB, HCT, PLT in the last 72 hours. Recent Labs    11/15/19 1049  NA 134*  K 4.3  CL 97*  CO2 24  GLUCOSE 103*  BUN 39*  CREATININE 7.19*  CALCIUM 12.2*    Intake/Output Summary (Last 24 hours) at 11/16/2019 0854 Last data filed at 11/16/2019 0757 Gross per 24 hour  Intake 776 ml  Output 300 ml  Net 476 ml        Physical Exam: Vital Signs Blood pressure 116/69, pulse (!) 101, temperature 98.3 F (36.8 C), resp. rate 20, height 5' 11"  (1.803 m), weight 102.4 kg, SpO2 95 %.  General: No acute distress Mood and affect are appropriate Heart: Regular rate and rhythm no rubs murmurs or extra sounds Lungs: Clear to auscultation, breathing unlabored, no rales or wheezes Abdomen: Positive bowel sounds, soft nontender to palpation, nondistended Extremities: No clubbing, cyanosis, or edema Skin: No evidence of breakdown, no evidence of rash , motor strength is 5/5 in left deltoid, bicep, tricep, grip, hip flexor, knee extensors, ankle dorsiflexor and plantar flexor 3/5 in the right deltoid bicep tricep grip, 0/5 in the right hip flexor knee extensor trace ankle dorsiflexor plantar flexor Sensory exam normal sensation to light touch  bilateral upper and left lower extremities, right lower extremity no sensation below the ankle Musculoskeletal: Full range of motion in all 4 extremities. No joint swelling  Assessment/Plan: 1. Functional deficits secondary to anoxic strokes with rhabdomyolysis right upper and right lower limb causing weakness which require 3+ hours per day of interdisciplinary therapy in a comprehensive inpatient rehab setting.  Physiatrist is providing  close team supervision and 24 hour management of active medical problems listed below.  Physiatrist and rehab team continue to assess barriers to discharge/monitor patient progress toward functional and medical goals  Care Tool:  Bathing    Body parts bathed by patient: Right arm, Left arm, Chest, Abdomen, Front perineal area, Right upper leg, Left upper leg, Face, Buttocks   Body parts bathed by helper: Right lower leg, Left lower leg     Bathing assist Assist Level: Contact Guard/Touching assist     Upper Body Dressing/Undressing Upper body dressing   What is the patient wearing?: Pull over shirt    Upper body assist Assist Level: Set up assist    Lower Body Dressing/Undressing Lower body dressing      What is the patient wearing?: Underwear/pull up, Pants     Lower body assist Assist for lower body dressing: Contact Guard/Touching assist     Toileting Toileting    Toileting assist Assist for toileting: Contact Guard/Touching assist     Transfers Chair/bed transfer  Transfers assist     Chair/bed transfer assist level: Contact Guard/Touching assist     Locomotion Ambulation   Ambulation assist      Assist level: Minimal Assistance - Patient > 75% Assistive device: Walker-rolling Max distance: 55   Walk 10 feet activity   Assist     Assist level: Contact Guard/Touching assist Assistive device: Walker-rolling   Walk 50 feet activity   Assist Walk 50 feet with 2 turns activity did not occur: Safety/medical concerns  Assist level: Contact Guard/Touching  assist Assistive device: Walker-rolling    Walk 150 feet activity   Assist Walk 150 feet activity did not occur: Safety/medical concerns         Walk 10 feet on uneven surface  activity   Assist Walk 10 feet on uneven surfaces activity did not occur: Safety/medical concerns         Wheelchair     Assist Will patient use wheelchair at discharge?: Yes (Per PT goals  ) Type of Wheelchair: Manual    Wheelchair assist level: Minimal Assistance - Patient > 75% Max wheelchair distance: 150    Wheelchair 50 feet with 2 turns activity    Assist        Assist Level: Minimal Assistance - Patient > 75%   Wheelchair 150 feet activity     Assist      Assist Level: Minimal Assistance - Patient > 75%   Blood pressure 116/69, pulse (!) 101, temperature 98.3 F (36.8 C), resp. rate 20, height 5' 11"  (1.803 m), weight 102.4 kg, SpO2 95 %.  Medical Problem List and Plan: 1.Right hemiparesis and generalized weaknesssecondary to rhabdomyolysis with neuropraxic peripheral nerve injury. Pt also with punctate occipital lobe and cerebellar infarcts as well as globi pallidi insults----potentially d/t anoxic injury -patient may shower -ELOS/Goals: 9/29 2. Antithrombotics: -DVT/anticoagulation:Pharmaceutical:Heparin -antiplatelet therapy: 96m daily 3. Pain Management:D/c dilaudid--has been using 2.5 mg/day--will transition to oxycodone prn.  Patient is doing well on current medications. Pain is well controlled 9/21- taking oxy IR 550mper day- do not plan to discharge home on opioid -will add gabapentin for neuropathic pain RLE 4. Mood:LCSW to follow for evaluation and support. -antipsychotic agents: N/A 5. Neuropsych: This patientiscapable of making decisions on hisown behalf. 6. Skin/Wound Care:Routine pressure relief measures, local wound care asneeded. Retrograde massage to R hand  7. Fluids/Electrolytes/Nutrition:Strict I/O. Check lytes in am.  8. AKI with rhabdomyolysis: CK- 2456 slowly coming down. HD dependent MWF- CLIP pending.  9. Crohn's disease: Remicade on hold.  10. Abnormal LFTs: Resolving with AST 9137-->57. Recheck LFTs in am.  11. Polysubstance abuse: Has completed CIWA protocol.  12. RUE/RLEweakness withedema and sensory  loss,neuropraxia: -PROM/AROM, edema control -outpt EMG/NCS potentially 13. Leucocytosis: Trending downward.  14. Anemia:Supplement with IV iron X 3 and Aranesp ordered. 15.Hypocalcemia: Treated with multiple doses IV calcium gluconate--now on CaCO3 bid.  9/21: hypercalcemic today- nephro contacted.   16.  Constipation: ongoing issues no BM x 3 d , only had BM after supp, denied problems at home increase senna, may need enema today if no BM   LOS: 7 days A FACE TO FACE EVALUATION WAS PERFORMED  AnCharlett Blake/24/2021, 8:54 AM

## 2019-11-16 NOTE — Progress Notes (Signed)
Speech Language Pathology Daily Session Note  Patient Details  Name: [name] MRN: [id number] Date of Birth: 1971-02-08  Today's Date: 11/16/2019 SLP Individual Time: 0730-0830 SLP Individual Time Calculation (min): 60 min  Short Term Goals: Week 1: SLP Short Term Goal 1 (Week 1): Pt will complete complex tasks with mod I functional problem solving. SLP Short Term Goal 2 (Week 1): Pt will recall new, complex information with mod I use of compensatory strategies. SLP Short Term Goal 3 (Week 1): Pt will alternate his attention between 2 mildly complex tasks with mod I.  Skilled Therapeutic Interventions: Skilled SLP intervention focused on cognition. Pt completed functional higher level reading task, "The Problem Printer."  No assistance needed with reading multiple paragraphs after given instructions at beginning of tasks. Pt identified 4 problems in the case presented and verbalized 4 solutions with moderate A to direct patient for each step and formulate logical solutions to problems presented. Increased time and verbal cues needed for pt to reason solutions to each problem. Time prediction calculations using simple word problems completed with 85% accuracy  min a verbal cues with slp repeating information. Pt left seated upright in bed with bed alarm set and all needs within reach.      Pain Pain Assessment Pain Scale: Faces Faces Pain Scale: No hurt  Therapy/Group: Individual Therapy  [name] [name] 11/16/2019, 9:33 AM

## 2019-11-16 NOTE — Progress Notes (Signed)
Patient ID: [name], male   DOB: 04/06/1970, 49 y.o.   MRN: [id number]  [facility] unable to accept patient for [facility] follow up due to insurance.

## 2019-11-16 NOTE — Plan of Care (Signed)
No BM with laxatives since enema

## 2019-11-16 NOTE — Progress Notes (Signed)
Physical Therapy Session Note  Patient Details  Name: [name] MRN: [id number] Date of Birth: [birth year]  Today's Date: 11/16/2019 PT Individual Time: 1105-1200 PT Individual Time Calculation (min): 55 min   Short Term Goals: Week 1:  PT Short Term Goal 1 (Week 1): Pt will perfom stand pivot transfer with min assist consistently. PT Short Term Goal 2 (Week 1): Pt will ambulate 9f with RW and min assist PT Short Term Goal 3 (Week 1): Pt will ascend/descend 4 steps with min assist and BUE support  Skilled Therapeutic Interventions/Progress Updates:   Pt received sitting in WC and agreeable to PT. WC mobility through hall 1227fwith supervision assist. Pt then transported to rehab gym. Pt applied DF wrap to the RLE. Stair management training x 8 steps (6") with min assist progressing to supervision assist and cues for UE placement, proper step to gait pattern, and improved posture.   Gait training with RW x [id number] supervision assist with RW and DF wrap, cues for symmetry on the RLE/LLE with mild improvement following cues. Dynamic gait training to weave through 6 cones with RW and close supervision assist, and cues for AD management.   PT instructed pt in use of QC and HHA for 2 bouts of gait training x [id number] bout and min assist. Able to perform improved step through with QC vs HHA.   Dynamic balance training to perform Wii bowling fine motor task while standing on airex pad x 8 frames. 1-2 UE support throughout with CGA-min assist from PT for safety. One near LOB anteriorly, but able to correct with use of UE support on RW. Cues intermittently for use of ankle strategy to improve COM control.       Therapy Documentation Precautions:  Precautions Precautions: Fall Precaution Comments: foot drop on RLE Restrictions Weight Bearing Restrictions: No Pain: Pain Assessment Pain Scale: Faces Faces Pain Scale: No hurt   Therapy/Group: Individual Therapy  AustLorie Phenix4/2021, 12:22 PM

## 2019-11-16 NOTE — Progress Notes (Signed)
Occupational Therapy Weekly Progress Note  Patient Details  Name: [name] MRN: [id number] Date of Birth: 1970-10-08  Beginning of progress report period: November 10, 2019 End of progress report period: November 17, 2019  Today's Date: 11/16/2019 OT Individual Time: 8325-4982 OT Individual Time Calculation (min): 70 min    Patient has met 4 of 4 short term goals. Patient able to stand at sink to complete 3/3 grooming tasks and UB bathing/dressing with close supervision A. Patient also able to recall 2-3 edema management techniques without cueing. Per MD, edema is intramuscular but swelling has decreased over time. In sitting, patient able to thread BLE into underwear/pants with use of reacher and Mod I. CGA to close supervision A required to hike pants over hips in standing. Patient making great progress toward LTG in prep for d/c home next week.   Patient continues to demonstrate the following deficits: muscle weakness and therefore will continue to benefit from skilled OT intervention to enhance overall performance with BADL and Reduce care partner burden.  Patient progressing toward long term goals..  Continue plan of care.  OT Short Term Goals Week 1:  OT Short Term Goal 1 (Week 1): Pt will stand to groom for 2 items to show improved endurance wiht CGA OT Short Term Goal 1 - Progress (Week 1): Met OT Short Term Goal 2 (Week 1): Pt will demo edema management techniques with no VC OT Short Term Goal 2 - Progress (Week 1): Met OT Short Term Goal 3 (Week 1): Pt will don shirt wiht set up OT Short Term Goal 3 - Progress (Week 1): Met OT Short Term Goal 4 (Week 1): Pt will thread BLE into pants wiht AE PRN OT Short Term Goal 4 - Progress (Week 1): Met Week 2:  OT Short Term Goal 2 (Week 2): STG = LTG 2/2 ELOS  Skilled Therapeutic Interventions/Progress Updates:  Patient met lying supine in bed in agreement with OT treatment session. Patient reporting 6/10 pain in RUE and RLE from  increased ambulation over the last few days. RN present at start of session to administer medication. UB bathing/dressing in standing with CGA to close supervision A for balance with occasional cues for weight distribution on RLE. Patient also able to bathe front perineal area and buttocks in standing with close supervision A. LB dressing in sitting with use of AE including reacher and sock-aid. In patient laundry room, patient able to place soiled clothing in washing machine in standing with use of RW and close supervision A. Functional mobility to therapy gym. Education on use of leg lifter to increase independence with return to supine transfers. Patient able to demonstrate with Mod I. BUE AROM with focus on R internal/external rotation. Supine to EOB with Mod I on mat table. Wc mobility from therapy gym to room without rest break. Session concluded with patient seated in wc with call bell within reach and all needs met.   Therapy Documentation Precautions:  Precautions Precautions: Fall Precaution Comments: foot drop on RLE Restrictions Weight Bearing Restrictions: No General:     Therapy/Group: Individual Therapy  Alishba Naples R Howerton-Davis 11/16/2019, 7:20 AM

## 2019-11-17 ENCOUNTER — Inpatient Hospital Stay (HOSPITAL_COMMUNITY): Payer: BC Managed Care – PPO | Admitting: Physical Therapy

## 2019-11-17 ENCOUNTER — Inpatient Hospital Stay (HOSPITAL_COMMUNITY): Payer: BC Managed Care – PPO | Admitting: Occupational Therapy

## 2019-11-17 DIAGNOSIS — K5901 Slow transit constipation: Secondary | ICD-10-CM

## 2019-11-17 NOTE — Progress Notes (Signed)
[place] PHYSICAL MEDICINE & REHABILITATION PROGRESS NOTE   Subjective/Complaints: Had a fair night. No new complaints. Right leg still weak  ROS: Patient denies fever, rash, sore throat, blurred vision, nausea, vomiting, diarrhea, cough, shortness of breath or chest pain, headache, or mood change.      Objective:   No results found. Recent Labs    11/16/19 1359  WBC 9.2  HGB 7.4*  HCT 23.7*  PLT 328   Recent Labs    11/15/19 1049 11/16/19 1400  NA 134* 132*  K 4.3 4.2  CL 97* 95*  CO2 24 26  GLUCOSE 103* 111*  BUN 39* 58*  CREATININE 7.19* 9.72*  CALCIUM 12.2* 11.9*    Intake/Output Summary (Last 24 hours) at 11/17/2019 0956 Last data filed at 11/17/2019 0818 Gross per 24 hour  Intake 562 ml  Output 3350 ml  Net -2788 ml        Physical Exam: Vital Signs Blood pressure 113/66, pulse 100, temperature 97.9 F (36.6 C), temperature source Oral, resp. rate 16, height 5' 11"  (1.803 m), weight 101.3 kg, SpO2 96 %.  Constitutional: No distress . Vital signs reviewed. HEENT: EOMI, oral membranes moist Neck: supple Cardiovascular: RRR without murmur. No JVD    Respiratory/Chest: CTA Bilaterally without wheezes or rales. Normal effort    GI/Abdomen: BS +, non-tender, non-distended Ext: no clubbing, cyanosis, or edema Psych: pleasant and cooperative Skin: No evidence of breakdown, no evidence of rash , motor strength is 5/5 in left deltoid, bicep, tricep, grip, hip flexor, knee extensors, ankle dorsiflexor and plantar flexor 3/5 in the right deltoid bicep tricep grip, 0-1/5 RLE Sensory exam normal sensation to light touch  bilateral upper and left lower extremities, right lower extremity no sensation below the ankle Musculoskeletal: Full range of motion in all 4 extremities. No joint swelling  Assessment/Plan: 1. Functional deficits secondary to anoxic strokes with rhabdomyolysis right upper and right lower limb causing weakness which require 3+ hours per day  of interdisciplinary therapy in a comprehensive inpatient rehab setting.  Physiatrist is providing close team supervision and 24 hour management of active medical problems listed below.  Physiatrist and rehab team continue to assess barriers to discharge/monitor patient progress toward functional and medical goals  Care Tool:  Bathing    Body parts bathed by patient: Right arm, Left arm, Chest, Abdomen, Front perineal area, Right upper leg, Left upper leg, Face, Buttocks   Body parts bathed by helper: Right lower leg, Left lower leg     Bathing assist Assist Level: Contact Guard/Touching assist     Upper Body Dressing/Undressing Upper body dressing   What is the patient wearing?: Pull over shirt    Upper body assist Assist Level: Set up assist    Lower Body Dressing/Undressing Lower body dressing      What is the patient wearing?: Underwear/pull up, Pants     Lower body assist Assist for lower body dressing: Contact Guard/Touching assist     Toileting Toileting    Toileting assist Assist for toileting: Contact Guard/Touching assist     Transfers Chair/bed transfer  Transfers assist     Chair/bed transfer assist level: Contact Guard/Touching assist     Locomotion Ambulation   Ambulation assist      Assist level: Minimal Assistance - Patient > 75% Assistive device: Walker-rolling Max distance: 55   Walk 10 feet activity   Assist     Assist level: Contact Guard/Touching assist Assistive device: Walker-rolling   Walk 50 feet activity  Assist Walk 50 feet with 2 turns activity did not occur: Safety/medical concerns  Assist level: Contact Guard/Touching assist Assistive device: Walker-rolling    Walk 150 feet activity   Assist Walk 150 feet activity did not occur: Safety/medical concerns         Walk 10 feet on uneven surface  activity   Assist Walk 10 feet on uneven surfaces activity did not occur: Safety/medical concerns          Wheelchair     Assist Will patient use wheelchair at discharge?: Yes (Per PT goals ) Type of Wheelchair: Manual    Wheelchair assist level: Minimal Assistance - Patient > 75% Max wheelchair distance: 150    Wheelchair 50 feet with 2 turns activity    Assist        Assist Level: Minimal Assistance - Patient > 75%   Wheelchair 150 feet activity     Assist      Assist Level: Minimal Assistance - Patient > 75%   Blood pressure 113/66, pulse 100, temperature 97.9 F (36.6 C), temperature source Oral, resp. rate 16, height 5' 11"  (1.803 m), weight 101.3 kg, SpO2 96 %.  Medical Problem List and Plan: 1.Right hemiparesis and generalized weaknesssecondary to rhabdomyolysis with neuropraxic peripheral nerve injury. Pt also with punctate occipital lobe and cerebellar infarcts as well as globi pallidi insults----potentially d/t anoxic injury -patient may shower -ELOS/Goals: 9/29 2. Antithrombotics: -DVT/anticoagulation:Pharmaceutical:Heparin -antiplatelet therapy: 44m daily 3. Pain Management:D/c dilaudid--has been using 2.5 mg/day--will transition to oxycodone prn.  Patient is doing well on current medications. Pain is well controlled 9/21- taking oxy IR 545mper day- do not plan to discharge home on opioid   -9/25 still using oxy IR q6 hr -  gabapentin for neuropathic pain RLE 4. Mood:LCSW to follow for evaluation and support. -antipsychotic agents: N/A 5. Neuropsych: This patientiscapable of making decisions on hisown behalf. 6. Skin/Wound Care:Routine pressure relief measures, local wound care asneeded. Retrograde massage to R hand  7. Fluids/Electrolytes/Nutrition:Strict I/O. Check lytes in am.  8. AKI with rhabdomyolysis: CK- 2456 slowly coming down. HD dependent MWF- CLIP pending.  9. Crohn's disease: Remicade on hold.  10. Abnormal LFTs: Resolving with AST 9137-->57. Recheck  LFTs in am.  11. Polysubstance abuse: Has completed CIWA protocol.  12. RUE/RLEweakness withedema and sensory loss,neuropraxia: -PROM/AROM, edema control -outpt EMG/NCS potentially 13. Leucocytosis: Trending downward.  14. Anemia:Supplement with IV iron X 3 and Aranesp ordered. 15.Hypocalcemia: Treated with multiple doses IV calcium gluconate--now on CaCO3 bid.  9/21: hypercalcemic today- nephro contacted.   16.  Constipation:   -had large bm 9/24  -continue scheduled miralax and senna-s LOS: 8 days A FACE TO FACE EVALUATION WAS PERFORMED  ZaMeredith Staggers/25/2021, 9:56 AM

## 2019-11-17 NOTE — Progress Notes (Signed)
Occupational Therapy Session Note  Patient Details  Name: [name] MRN: [id number] Date of Birth: [birth year]  Today's Date: 11/17/2019 OT Individual Time: 1355-1435 OT Individual Time Calculation (min): 40 min    Short Term Goals: Week 2:  OT Short Term Goal 2 (Week 2): STG = LTG 2/2 ELOS  Skilled Therapeutic Interventions/Progress Updates:    Pt sitting up in w/c with c/o 7/10 pain on right side, no focal points per pt.  Pt reports he just received pain meds and agreeable to working with OT.  Pt self propelled w/c to large gym entry with supervision to facilitate increased UB strength and endurance.  OT transported pt to ortho gym for time mgt.  SPT w/c to nustep seat using RW with supervision.  Nustep level 2 x 10 minutes completed to increase pt endurance and activity tolerance.  Pt completed sit to stand and ambulated to dynavision board using RW with CGA.  Pt completed 2 trials x 60 seconds each using RUE with average reaction time of 1.6 seconds.  Pt needed CGA to complete in standing without AD, no LOB noted throughout.  Pt transported back to room. SPT w/c to EOB with CGA and sit to supine with supervision.  Call bell in reach, bed alarm on.   Therapy Documentation Precautions:  Precautions Precautions: Fall Precaution Comments: foot drop on RLE Restrictions Weight Bearing Restrictions: No   Therapy/Group: Individual Therapy  Ezekiel Slocumb 11/17/2019, 4:34 PM

## 2019-11-17 NOTE — Progress Notes (Signed)
Patient ID: [name], male   DOB: 1970/06/14, 49 y.o.   MRN: [id number] S: No complaints O:BP 113/66 (BP Location: Left Arm)   Pulse 100   Temp 97.9 F (36.6 C) (Oral)   Resp 16   Ht 5' 11"  (1.803 m)   Wt 101.3 kg   SpO2 96%   BMI 31.15 kg/m   Intake/Output Summary (Last 24 hours) at 11/17/2019 1213 Last data filed at 11/17/2019 0818 Gross per 24 hour  Intake 562 ml  Output 3200 ml  Net -2638 ml   Intake/Output: I/O last 3 completed shifts: In: 940 [P.O.:940] Out: 4888 [Urine:450; Other:3000]  Intake/Output this shift:  Total I/O In: 100 [P.O.:100] Out: 200 [Urine:200] Weight change: 0.6 kg Gen: NAD CVS: tachy Resp: cta Abd: benign Ext: 2+ edema of RUE and RLE, 1+ edema of LUE and LLE  Recent Labs  Lab 11/12/19 1132 11/13/19 0636 11/15/19 1049 11/16/19 1400  NA 134* 134* 134* 132*  K 4.4 5.3* 4.3 4.2  CL 96* 97* 97* 95*  CO2 26 27 24 26   GLUCOSE 120* 116* 103* 111*  BUN 57* 55* 39* 58*  CREATININE 8.45* 8.80* 7.19* 9.72*  ALBUMIN 2.4* 2.2* 2.2* 2.3*  CALCIUM 11.5* 13.8* 12.2* 11.9*  PHOS 7.0* 9.6* 7.5* 8.3*   Liver Function Tests: Recent Labs  Lab 11/13/19 0636 11/15/19 1049 11/16/19 1400  ALBUMIN 2.2* 2.2* 2.3*   No results for input(s): LIPASE, AMYLASE in the last 168 hours. No results for input(s): AMMONIA in the last 168 hours. CBC: Recent Labs  Lab 11/12/19 0957 11/13/19 0636 11/16/19 1359  WBC 15.8* 10.8* 9.2  NEUTROABS  --  7.6  --   HGB 9.7* 8.6* 7.4*  HCT 31.1* 27.9* 23.7*  MCV 94.8 96.5 96.0  PLT 447* 360 328   Cardiac Enzymes: Recent Labs  Lab 11/14/19 0642  CKTOTAL 428*   CBG: Recent Labs  Lab 11/15/19 1111 11/15/19 1615 11/16/19 1215 11/16/19 1841 11/16/19 2115  GLUCAP 87 96 100* 103* 110*    Iron Studies: No results for input(s): IRON, TIBC, TRANSFERRIN, FERRITIN in the last 72 hours. Studies/Results: No results found. Marland Kitchen aspirin EC  81 mg Oral Daily  . Chlorhexidine Gluconate Cloth  6 each Topical Daily   . Chlorhexidine Gluconate Cloth  6 each Topical Q0600  . gabapentin  100 mg Oral TID  . heparin  5,000 Units Subcutaneous Q8H  . metoprolol tartrate  12.5 mg Oral BID  . polyethylene glycol  17 g Oral BID  . senna-docusate  3 tablet Oral BID    BMET    Component Value Date/Time   NA 132 (L) 11/16/2019 1400   K 4.2 11/16/2019 1400   CL 95 (L) 11/16/2019 1400   CO2 26 11/16/2019 1400   GLUCOSE 111 (H) 11/16/2019 1400   BUN 58 (H) 11/16/2019 1400   CREATININE 9.72 (H) 11/16/2019 1400   CALCIUM 11.9 (H) 11/16/2019 1400   GFRNONAA 6 (L) 11/16/2019 1400   GFRAA 7 (L) 11/16/2019 1400   CBC    Component Value Date/Time   WBC 9.2 11/16/2019 1359   RBC 2.47 (L) 11/16/2019 1359   HGB 7.4 (L) 11/16/2019 1359   HCT 23.7 (L) 11/16/2019 1359   PLT 328 11/16/2019 1359   MCV 96.0 11/16/2019 1359   MCH 30.0 11/16/2019 1359   MCHC 31.2 11/16/2019 1359   RDW 13.3 11/16/2019 1359   LYMPHSABS 1.4 11/13/2019 0636   MONOABS 1.0 11/13/2019 0636   EOSABS 0.2 11/13/2019 0636  BASOSABS 0.1 11/13/2019 0636   HPI: 49 yo with h/o Crohn's disease, depression, found down at home after drinking alcohol and + cocaine, In the ED he was noted to have low BP's, Cr 5.9, K >7, MRI with watershed pattern of infarct, CK level >50,000, AST 4,337, ALT 1851, WBC 43.5, Hgb 19.3, plt 276.  Assessment/Plan:  1. AKIpresumably due to ischemic ATN from hypotension/hypovolemia as well as rhabdomyolysis, +/- cocaine. S/p TDC on 10/31/19 and started on HD 10/31/19 and has remained oliguric/anuric and dialysis dependent. 1. Plan to keep on MWF schedule for now. 2. Follow UOP and Scr over the weekend for signs of renal recovery 2. Rhabdomyolysis- improving with HD 3. Hypercalcemia- have stopped phoslo and tums. iPTH low at 8, and vit D low at 8.79.  Continue to use low Ca dialysate with HD.  Slowly improving. 4. Hyperkalemia- improved with HD 5. SHPTH- will start fosrenol for ^ phos. 6. Drug abuse- per  primary 7. H/o Crohn's- stable on remicade 8. Anemia of critical illness and AKI. S/p IV iron and aranesp 40 mcg. Follow h/h. 9. Deconditioning- now in CIR.  [name], MD [employer] [phone]

## 2019-11-17 NOTE — Progress Notes (Signed)
Physical Therapy Weekly Progress Note  Patient Details  Name: [name] MRN: [id number] Date of Birth: 05/16/1970  Beginning of progress report period: November 10, 2019 End of progress report period: November 17, 2019  Today's Date: 11/17/2019 PT Individual Time: 0923-1020 PT Individual Time Calculation (min): 57 min   Patient has met 3 of 3 short term goals.  Pt is making steady progress towards LTG. Has progressed to supervision assist from PT for bed mobility, transfer and with RW and gait up to 151f. Continued pain in the RLE/RUE and well as swelling limits increased distance with gait. Foot drop on the R RLE also persists with little improvement.   Patient continues to demonstrate the following deficits muscle weakness and muscle paralysis, decreased cardiorespiratoy endurance, abnormal tone and unbalanced muscle activation and decreased standing balance, hemiplegia and decreased balance strategies and therefore will continue to benefit from skilled PT intervention to increase functional independence with mobility.  Patient progressing toward long term goals..  Continue plan of care.  PT Short Term Goals Week 1:  PT Short Term Goal 1 (Week 1): Pt will perfom stand pivot transfer with min assist consistently. PT Short Term Goal 1 - Progress (Week 1): Met PT Short Term Goal 2 (Week 1): Pt will ambulate 1019fwith RW and min assist PT Short Term Goal 2 - Progress (Week 1): Met PT Short Term Goal 3 (Week 1): Pt will ascend/descend 4 steps with min assist and BUE support PT Short Term Goal 3 - Progress (Week 1): Met Week 2:  PT Short Term Goal 1 (Week 2): STG=LTG due to ELOS  Skilled Therapeutic Interventions/Progress Updates:   Pt received supine in bed and agreeable to PT. Supine>sit transfer without assist. supervoision assist stand pivot transfer to WC.   WC mobility x [id number] [id number] supervision assist for safety and cues for doorway management.   Remove clothing  from dryer with UE support on RW and supervision-CGA from PT throughout. Pt dropped several pieces of clothing on the floor and was able to pick up from floor without LOB or additional assistance.   PT noted significantly decreased swelling the RUE following KT taping over the prior 2 days. Noted several areas of KT taping starting to fall off. PT reapplied KT tape to the R hand and then to the RLE to reduce edema.   Gait training with RW 2 x [id number] supervision assist and DF Wrap, cues for improved postural control throughout gait training  And use of hip flexors to advance the RLE. PT strongly encouraged pt's wife to bring shoes as soon as possible to allow assessment for AFO upon d/c.   Pt returned to room and performed stand pivot transfer to bed with supervision assist and RW. Sit>supine completed with supervision assist and leg lifted to pull RLE onto bed. Pt left supine in bed with call bell in reach and all needs met.            Therapy Documentation Precautions:  Precautions Precautions: Fall Precaution Comments: foot drop on RLE Restrictions Weight Bearing Restrictions: No    Pain: Pain Assessment Pain Scale: 0-10 Pain Score: 7  Pain Type: Acute pain Pain Location: Foot Pain Orientation: Right Pain Descriptors / Indicators: Aching Pain Frequency: Intermittent Pain Onset: With Activity Pain Intervention(s): Medication (See eMAR)   Therapy/Group: Individual Therapy  AustiLorie Phenix/2021, 10:26 AM

## 2019-11-18 NOTE — Progress Notes (Signed)
Patient ID: [name], male   DOB: December 28, 1970, 49 y.o.   MRN: [id number] S: No complaints O:BP 113/60 (BP Location: Right Arm)   Pulse 94   Temp 98.7 F (37.1 C) (Oral)   Resp 16   Ht 5' 11"  (1.803 m)   Wt 101.6 kg   SpO2 97%   BMI 31.24 kg/m   Intake/Output Summary (Last 24 hours) at 11/18/2019 1139 Last data filed at 11/18/2019 1038 Gross per 24 hour  Intake 915 ml  Output 200 ml  Net 715 ml   Intake/Output: I/O last 3 completed shifts: In: 897 [P.O.:897] Out: 200 [Urine:200]  Intake/Output this shift:  Total I/O In: 358 [P.O.:358] Out: 200 [Urine:200] Weight change: -1.4 kg Gen: NAD CVS: no rub Resp: cta Abd: benign Ext: 2+ edema of right arm and right leg, 1+ on left side  Recent Labs  Lab 11/12/19 1132 11/13/19 0636 11/15/19 1049 11/16/19 1400  NA 134* 134* 134* 132*  K 4.4 5.3* 4.3 4.2  CL 96* 97* 97* 95*  CO2 26 27 24 26   GLUCOSE 120* 116* 103* 111*  BUN 57* 55* 39* 58*  CREATININE 8.45* 8.80* 7.19* 9.72*  ALBUMIN 2.4* 2.2* 2.2* 2.3*  CALCIUM 11.5* 13.8* 12.2* 11.9*  PHOS 7.0* 9.6* 7.5* 8.3*   Liver Function Tests: Recent Labs  Lab 11/13/19 0636 11/15/19 1049 11/16/19 1400  ALBUMIN 2.2* 2.2* 2.3*   No results for input(s): LIPASE, AMYLASE in the last 168 hours. No results for input(s): AMMONIA in the last 168 hours. CBC: Recent Labs  Lab 11/12/19 0957 11/13/19 0636 11/16/19 1359  WBC 15.8* 10.8* 9.2  NEUTROABS  --  7.6  --   HGB 9.7* 8.6* 7.4*  HCT 31.1* 27.9* 23.7*  MCV 94.8 96.5 96.0  PLT 447* 360 328   Cardiac Enzymes: Recent Labs  Lab 11/14/19 0642  CKTOTAL 428*   CBG: Recent Labs  Lab 11/15/19 1111 11/15/19 1615 11/16/19 1215 11/16/19 1841 11/16/19 2115  GLUCAP 87 96 100* 103* 110*    Iron Studies: No results for input(s): IRON, TIBC, TRANSFERRIN, FERRITIN in the last 72 hours. Studies/Results: No results found. Marland Kitchen aspirin EC  81 mg Oral Daily  . Chlorhexidine Gluconate Cloth  6 each Topical Daily  .  Chlorhexidine Gluconate Cloth  6 each Topical Q0600  . gabapentin  100 mg Oral TID  . heparin  5,000 Units Subcutaneous Q8H  . metoprolol tartrate  12.5 mg Oral BID  . polyethylene glycol  17 g Oral BID  . senna-docusate  3 tablet Oral BID    BMET    Component Value Date/Time   NA 132 (L) 11/16/2019 1400   K 4.2 11/16/2019 1400   CL 95 (L) 11/16/2019 1400   CO2 26 11/16/2019 1400   GLUCOSE 111 (H) 11/16/2019 1400   BUN 58 (H) 11/16/2019 1400   CREATININE 9.72 (H) 11/16/2019 1400   CALCIUM 11.9 (H) 11/16/2019 1400   GFRNONAA 6 (L) 11/16/2019 1400   GFRAA 7 (L) 11/16/2019 1400   CBC    Component Value Date/Time   WBC 9.2 11/16/2019 1359   RBC 2.47 (L) 11/16/2019 1359   HGB 7.4 (L) 11/16/2019 1359   HCT 23.7 (L) 11/16/2019 1359   PLT 328 11/16/2019 1359   MCV 96.0 11/16/2019 1359   MCH 30.0 11/16/2019 1359   MCHC 31.2 11/16/2019 1359   RDW 13.3 11/16/2019 1359   LYMPHSABS 1.4 11/13/2019 0636   MONOABS 1.0 11/13/2019 0636   EOSABS 0.2 11/13/2019 0636  BASOSABS 0.1 11/13/2019 0636     HPI: 49 yo with h/o Crohn's disease, depression, found down at home after drinking alcohol and + cocaine, In the ED he was noted to have low BP's, Cr 5.9, K >7, MRI with watershed pattern of infarct, CK level >50,000, AST 4,337, ALT 1851, WBC 43.5, Hgb 19.3, plt 276.  Assessment/Plan:  1. AKIpresumably due to ischemic ATN from hypotension/hypovolemia as well as rhabdomyolysis, +/- cocaine. S/p TDC on 10/31/19 and started on HD 10/31/19 and has remained oliguric/anuric and dialysis dependent. 1. Plan to keep on MWF schedule for now. 2. Follow UOP and Scr over the weekend for signs of renal recovery 3. Unfortunately remains oliguric and dialysis dependent. 2. Rhabdomyolysis- improving with HD 3. Hypercalcemia- have stopped phoslo and tums. iPTH low at 8, and vit D low at 8.79.  Continue to use low Ca dialysate with HD.  Slowly improving. 4. Hyperkalemia-improved with HD 5. SHPTH- will  start fosrenol for ^ phos. 6. Drug abuse- per primary 7. H/o Crohn's- stable on remicade 8. Anemia of critical illness and AKI. S/p IV iron and aranesp 40 mcg. Follow h/h. 9. Deconditioning- now in CIR.  Danny Potts, MD [facility] [phone] \

## 2019-11-19 ENCOUNTER — Inpatient Hospital Stay (HOSPITAL_COMMUNITY): Payer: BC Managed Care – PPO | Admitting: Occupational Therapy

## 2019-11-19 ENCOUNTER — Inpatient Hospital Stay (HOSPITAL_COMMUNITY): Payer: BC Managed Care – PPO | Admitting: Physical Therapy

## 2019-11-19 ENCOUNTER — Encounter (HOSPITAL_COMMUNITY): Payer: BC Managed Care – PPO | Admitting: Psychology

## 2019-11-19 ENCOUNTER — Inpatient Hospital Stay (HOSPITAL_COMMUNITY): Payer: BC Managed Care – PPO | Admitting: Speech Pathology

## 2019-11-19 DIAGNOSIS — I63111 Cerebral infarction due to embolism of right vertebral artery: Secondary | ICD-10-CM

## 2019-11-19 LAB — CBC
HCT: 22.3 % — ABNORMAL LOW (ref 39.0–52.0)
HCT: 22.5 % — ABNORMAL LOW (ref 39.0–52.0)
Hemoglobin: 6.8 g/dL — CL (ref 13.0–17.0)
Hemoglobin: 7 g/dL — ABNORMAL LOW (ref 13.0–17.0)
MCH: 29.1 pg (ref 26.0–34.0)
MCH: 29.7 pg (ref 26.0–34.0)
MCHC: 30.5 g/dL (ref 30.0–36.0)
MCHC: 31.1 g/dL (ref 30.0–36.0)
MCV: 95.3 fL (ref 80.0–100.0)
MCV: 95.3 fL (ref 80.0–100.0)
Platelets: 327 10*3/uL (ref 150–400)
Platelets: 310 10*3/uL (ref 150–400)
RBC: 2.34 MIL/uL — ABNORMAL LOW (ref 4.22–5.81)
RBC: 2.36 MIL/uL — ABNORMAL LOW (ref 4.22–5.81)
RDW: 13.3 % (ref 11.5–15.5)
RDW: 13.4 % (ref 11.5–15.5)
WBC: 7.2 10*3/uL (ref 4.0–10.5)
WBC: 6.5 10*3/uL (ref 4.0–10.5)
nRBC: 0 % (ref 0.0–0.2)
nRBC: 0 % (ref 0.0–0.2)

## 2019-11-19 LAB — RENAL FUNCTION PANEL
Albumin: 2.3 g/dL — ABNORMAL LOW (ref 3.5–5.0)
Anion gap: 11 (ref 5–15)
BUN: 44 mg/dL — ABNORMAL HIGH (ref 6–20)
CO2: 26 mmol/L (ref 22–32)
Calcium: 8.6 mg/dL — ABNORMAL LOW (ref 8.9–10.3)
Chloride: 97 mmol/L — ABNORMAL LOW (ref 98–111)
Creatinine, Ser: 10.15 mg/dL — ABNORMAL HIGH (ref 0.61–1.24)
GFR calc Af Amer: 6 mL/min — ABNORMAL LOW (ref >60)
GFR calc non Af Amer: 5 mL/min — ABNORMAL LOW (ref >60)
Glucose, Bld: 105 mg/dL — ABNORMAL HIGH (ref 70–99)
Phosphorus: 6.1 mg/dL — ABNORMAL HIGH (ref 2.5–4.6)
Potassium: 3.5 mmol/L (ref 3.5–5.1)
Sodium: 134 mmol/L — ABNORMAL LOW (ref 135–145)

## 2019-11-19 MED ORDER — HEPARIN SODIUM (PORCINE) 1000 UNIT/ML IJ SOLN
INTRAMUSCULAR | Status: AC
  Administered 2019-11-19: 1000 [IU]
  Filled 2019-11-19: qty 4

## 2019-11-19 MED ORDER — HEPARIN SODIUM (PORCINE) 1000 UNIT/ML DIALYSIS: 20.0000 [IU]/kg | INTRAMUSCULAR | Status: DC | PRN

## 2019-11-19 NOTE — Progress Notes (Signed)
Speech Language Pathology Daily Session Note  Patient Details  Name: [name] MRN: [id number] Date of Birth: 1970-09-25  Today's Date: 11/19/2019 SLP Individual Time: 0730-0825 SLP Individual Time Calculation (min): 55 min  Short Term Goals: Week 1: SLP Short Term Goal 1 (Week 1): Pt will complete complex tasks with mod I functional problem solving. SLP Short Term Goal 2 (Week 1): Pt will recall new, complex information with mod I use of compensatory strategies. SLP Short Term Goal 3 (Week 1): Pt will alternate his attention between 2 mildly complex tasks with mod I.  Skilled Therapeutic Interventions: Pt was seen for skilled ST targeting cognitive goals. SLP facilitated session with a PEG board task in which pt recreated semi-complex to complex designs from photographs. He was independent with problem solving and self-monitoring of strategies to eliminate errors throughout task. Pt required supervision A question cues to identify a task he may need assistance with at discharge given current level of physical and cognitive functioning. Supervision A question cues also provided for recall of interdisciplinary rehab goals and activities. He identified 2 compensatory memory strategies with independently (note taking and calendar). Handout also provided and discussed associations, timers/alarms, and routines. Pt scored 26/30 on MOCA version 7.1 (WNL=26 or above) requiring only cues for alternating attention, recall of 1/5 words, and slightly delayed processing noted. Pt reports he feels very close to baseline, cognitively, although endorses feeling a "mental fog" and remembering some new information. Pt left laying in bed with alarm set and needs within reach. Continue per current plan of care.          Pain Pain Assessment Pain Scale: Faces Faces Pain Scale: No hurt   Therapy/Group: Individual Therapy  Arbutus Leas 11/19/2019, 10:48 AM

## 2019-11-19 NOTE — Consult Note (Signed)
Neuropsychological Consultation   Patient:   [name]   DOB:   [birth year]  MR Number:  [id number]  Location:  [place] [facility] [place] [id number] [place] [address] Dept: [place]: [phone]           Date of Service:   11/19/2019  Start Time:   10 AM End Time:   11 AM  Provider/Observer:  Ilean Skill, Psy.D.       Clinical Neuropsychologist       Billing Code/Service: [id number]  Chief Complaint:    [name] is a 49 year old male with a history of Crohn's, PE, depression and recent uvula surgery.  Patient was admitted on 10/27/2019 with right upper extremity and left lower extremity weakness.  According to reports the patient was intoxicated the night before on alcohol and had taken MDMA and had passed out/blacked out on the floor laying on his right side until the next morning.  Urine drug screen was positive for cocaine and alcohol level was 24.  The patient woke up in severe pain on his right side with swelling in his arm and leg, was hypotensive at admission and had acute renal failure with rhabdomyolysis.  Patient was treated with IV fluids as well as Lokelma.  MRI brain showed acute punctuate infarcts in the right occipital lobe and right cerebellum as well as restricted diffusion globi pallidi.  Renal ultrasound was negative for hydronephrosis.  Nephrology has been followed for acute renal failure and hemodialysis initiated due to poor recovery and poor urine output.  Cerebral injury was felt to be due to overdose, hypotension with prolonged downtime and question of possible opiates added to cocaine.  Reason for Service:  Patient was referred for neuropsychological consult due to history of depression and recent significant medical complications due to rhabdo and other complications from being down on his right side for extended period of time.  Below is the HPI for the current  admission.  [name] is a49 year old male with history of Crohn's, PE, depression,recent uvula surgery; who was admitted on 10/27/2019 with RUE/RLE weakness. Per reports, the night before patient was intoxicated on alcohol,took a Mollyand blacked out on friends break flow and rate on his right side till the next morning. UDS positive for cocaine and EtOH level-24. He woke up with severe pain on right side with swelling of his arm and leg, was hypotensive at admission, had acute renal failure with rhabdomyolysis. He was treated with IV fluids as well as Lokelma. MRI brain showed acute punctate infarcts in right occipital lobe and right cerebellum as well as restricted diffusion globipallidi,MRI of cervical and lumbar spine done due to weakness on the right and showed mild right and left C8/4 foraminal stenosis with mild facet arthrosis L4-S1. MRA head/neck negative for LVO. Renal ultrasound was negative for hydronephrosis and showed normal echogenicity.   Nephrology following for input on acute renal failure and hemodialysis initiated due to poor recovery and poor urine output. Dr. Leonel Ramsay felt that weakness was peripheral in nature due to neuropraxia and rhabdomyolysis. Cerebral injury felt to be due to overdose, hypotension with prolonged downtime,question of fentanyl/cocaine combo--supportive care recommended. 2D echo showed EF of 55% with normal function. Right hip films were negative for fracture. Dr. Stann Mainland also consulted for input on edema RUE/RLE and recommended monitoring for compartment syndrome. RUE Dopplers done on 09/10 due to increasing edema and was negative for DVT or superficial  thrombosis. CPK on downward trend. Hemodialysis ongoing MWFand CLIP process initiated.Normocytic anemiabeing managed with IV iron X 3 and ESA added today.Has been getting IV Dilaudid as needed for pain. Hecontinues to have limitations due to right-sided weakness with sensory deficits and higher  level cognitive deficits. CIR recommended due to functional decline.  Current Status:  The patient was sitting upright in his bed upon entering the room and was alert and oriented.  There was no indications of any alterations in cognition with good mental status.  Patient talked primarily of his difficulties with the right side of his body and reports that left side motor function appear to be intact.  Patient denies significant motor function deficits for his left side.  Patient presented with rather flat affect but denied any significant depressive symptomatology.  The patient reports that he continues to struggle with right-sided weakness and muscle deficits but is experiencing significant improvements with rehab.  Patient is to be discharged on the 29th.  Behavioral Observation: [name]  presents as a 49 y.o.-year-old Right Caucasian Male who appeared his stated age. his dress was Appropriate and he was Well Groomed and his manners were Appropriate to the situation.  his participation was indicative of Appropriate and Attentive behaviors.  There were any physical disabilities noted.  he displayed an appropriate level of cooperation and motivation.     Interactions:    Active Appropriate and Attentive  Attention:   within normal limits and attention span and concentration were age appropriate  Memory:   within normal limits; recent and remote memory intact  Visuo-spatial:  not examined  Speech (Volume):  low  Speech:   normal; normal  Thought Process:  Coherent and Relevant  Though Content:  WNL; not suicidal and not homicidal  Orientation:   person, place, time/date and situation  Judgment:   Good  Planning:   Good  Affect:    Appropriate  Mood:    Dysphoric  Insight:   Good  Intelligence:   high  Substance Use:  Patient denies ongoing history of significant regular substance abuse.  He acknowledges going to a party and ingesting various substances at this party but  was unaware if the cocaine that he had taken was laced with other chemicals such as fentanyl.  Patient denied regular drug use.   Medical History:   Past Medical History:  Diagnosis Date  . Allergy   . Crohn's colitis ([place])   . Depression   . Pulmonary embolism [facility])             Psychiatric History:  Patient has prior history of depression with significant medical issues including Crohn's disease receiving medications and prior pulmonary embolism.  Family Med/Psych History:  Family History  Problem Relation Age of Onset  . Colon polyps Father   . Multiple sclerosis Sister   . Colon cancer Neg Hx   . Esophageal cancer Neg Hx   . Stomach cancer Neg Hx   . Pancreatic cancer Neg Hx   . Liver disease Neg Hx    Impression/DX:  [name] is a 49 year old male with a history of Crohn's, PE, depression and recent uvula surgery.  Patient was admitted on 10/27/2019 with right upper extremity and left lower extremity weakness.  According to reports the patient was intoxicated the night before on alcohol and had taken MDMA and had passed out/blacked out on the floor laying on his right side until the next morning.  Urine drug screen was positive for  cocaine and alcohol level was 24.  The patient woke up in severe pain on his right side with swelling in his arm and leg, was hypotensive at admission and had acute renal failure with rhabdomyolysis.  Patient was treated with IV fluids as well as Lokelma.  MRI brain showed acute punctuate infarcts in the right occipital lobe and right cerebellum as well as restricted diffusion globi pallidi.  Renal ultrasound was negative for hydronephrosis.  Nephrology has been followed for acute renal failure and hemodialysis initiated due to poor recovery and poor urine output.  Cerebral injury was felt to be due to overdose, hypotension with prolonged downtime and question of possible opiates added to cocaine.  The patient was sitting upright in his bed upon  entering the room and was alert and oriented.  There was no indications of any alterations in cognition with good mental status.  Patient talked primarily of his difficulties with the right side of his body and reports that left side motor function appear to be intact.  Patient denies significant motor function deficits for his left side.  Patient presented with rather flat affect but denied any significant depressive symptomatology.  The patient reports that he continues to struggle with right-sided weakness and muscle deficits but is experiencing significant improvements with rehab.  Patient is to be discharged on the 29th.  Disposition/Plan:  Patient denied significant depressive symptoms but his affect was very flat and generally dysphoric.  We reviewed issues related to adjustment and coping following sudden changes in physical status such as he is experience.  We worked on Radiographer, therapeutic going forward.  We did talk about the potential for depressive symptomatology that he had prior experience to recur and what to watch out for for worsening of depression.  This is particularly important to monitor as the patient has considerable rehabilitative efforts going forward.  If the patient develops any significant depressive symptomatology he is to inform follow-up physician about the development of the symptoms and I will be available to see him on an outpatient basis if need be.  Diagnosis:    Rhabdo, embolic stroke        Electronically Signed   _______________________ Ilean Skill, Psy.D.

## 2019-11-19 NOTE — Progress Notes (Signed)
Occupational Therapy Session Note  Patient Details  Name: [name] MRN: [id number] Date of Birth: 04-Jun-1970  Today's Date: 11/19/2019 OT Individual Time: 1105-1200 OT Individual Time Calculation (min): 55 min   Skilled Therapeutic Interventions/Progress Updates:    Pt greeted in bed and premedicated for Rt sided pain. He requested to have his hair washed. Bed mobility completed unassisted and then he ambulated with RW to the w/c parked beside sink with supervision assist. Due to improper fit of w/c in front of sink, he completed stand pivot<chair with supervision. OT then washed his hair using the hair washing tray which appeared to brighten affect. He then transferred to the w/c and combed hair, styled hair, and trimmed his nails at independent level. Pt doffed soiled socks and donned clean gripper socks with setup of AE. He reports the only items he will need at home are the "hip kit and walker." Discussed that he has to independently purchase a hip kit and we discussed places where he could do this in person and online. Pt verbalized understanding. Transitioned to IADL retraining with focus on dynamic balance and functional mobility using RW while he stripped bed of soiled linen and reapplied clean linen. Vcs for problem solving fitted sheet and how to safely transport items by draping over walker. Note that he had 2 LOBs requiring Min-Mod A to correct, 1 posterior when backing up and another anterior after reaching behind for a pillow and then tossing onto bed. Pt required 3 seated rest breaks before fully making up bed. At end of session he returned to bed and was left with Rt side elevated for edema mgt, call bell in hand and 4 bedrails up per request.  Therapy Documentation Precautions:  Precautions Precautions: Fall Precaution Comments: foot drop on RLE Restrictions Weight Bearing Restrictions: No Pain: Pain Assessment Pain Scale: Faces Pain Score: 7  Faces Pain Scale: No  hurt Pain Location: Hand Pain Orientation: Right Patients Stated Pain Goal: 2 Pain Intervention(s): Medication (See eMAR) ADL: ADL Grooming: Supervision/safety Where Assessed-Grooming: Sitting at sink Upper Body Bathing: Supervision/safety Where Assessed-Upper Body Bathing: Sitting at sink Lower Body Bathing: Moderate assistance Where Assessed-Lower Body Bathing: Sitting at sink, Standing at sink Upper Body Dressing: Minimal assistance Where Assessed-Upper Body Dressing: Sitting at sink Lower Body Dressing: Maximal assistance Where Assessed-Lower Body Dressing: Sitting at sink, Standing at sink Toileting: Moderate assistance Where Assessed-Toileting: Toilet, Bedside Commode Toilet Transfer: Minimal assistance Toilet Transfer Method: Stand pivot     Therapy/Group: Individual Therapy  Nazaire Cordial A Nedim Oki 11/19/2019, 12:13 PM

## 2019-11-19 NOTE — Progress Notes (Signed)
Physical Therapy Session Note  Patient Details  Name: [name] MRN: [id number] Date of Birth: [birth year]  Today's Date: 11/19/2019 PT Individual Time: 0835-0950 PT Individual Time Calculation (min): 75 min   Short Term Goals: Week 2:  PT Short Term Goal 1 (Week 2): STG=LTG due to ELOS  Skilled Therapeutic Interventions/Progress Updates: Pt presented in bed agreeable to therapy. Pt states some pain in R hemi-body generalized but just received pain meds. Performed stand pivot without AD CGA to w/c and pt propelled w/c with supervision to rehab gym. Performed stand pivot in same manner to mat and performed STS x 10 for BLE strengthening. Pt required min cues for increased eccentric control when sitting. Pt then performed dynamic balance with use of rebounder forward and with trunk rotation x 20 ea. PTA then donned ace bandage for DF assist and pt ambulated 126f with RW and CGA fading to supervision. Pt noted to demonstrate improving cadence and more equal step length. Pt also participated in obstacle course  Including thresholds, weaving through cones, and stepping onto 4in step. Pt was able to perform all demonstrating good safety awareness and no LOB. Pt then participated in [employer] LOS. Pt noted to have significant difficulty wt shifting to R and minimal ankle strategy on R to the point of requiring assist to wt shift. Pt then participated in standing static balance while standing on A[employer]x without AD and in normal stance and with narrow BOS. Pt required to facilitation for wt shifting to R but was able to improve. Pt then returned to w/c and transported back to room. Pt indicated at end of session wife brought in different pair of shoes. PTA was able to don shoes total A for time management however not enough room for AFO. Pt ambulated in room with shoes and CGA with ace bandage for DF assist. Pt with x 2 bouts of toe catching with shoes on but was able to correct without LOB.  Discussed with pt  possible trial of toe up brace tomorrow although grad day. Pt voiced understanding. Pt returned to bed at end of session and left with call bell within reach, bed alarm on, and needs met.      Therapy Documentation Precautions:  Precautions Precautions: Fall Precaution Comments: foot drop on RLE Restrictions Weight Bearing Restrictions: No General:   Vital Signs:   Pain: Pain Assessment Pain Scale: Faces Pain Score: 7  Faces Pain Scale: No hurt Pain Location: Hand Pain Orientation: Right Patients Stated Pain Goal: 2 Pain Intervention(s): Medication (See eMAR) Mobility:   Locomotion :    Trunk/Postural Assessment :    Balance:   Exercises:   Other Treatments:      Therapy/Group: Individual Therapy  Mykal Kirchman 11/19/2019, 12:57 PM

## 2019-11-19 NOTE — Progress Notes (Signed)
[place] PHYSICAL MEDICINE & REHABILITATION PROGRESS NOTE   Subjective/Complaints: No issues overnite , appreciate nephro note, bowels doing better per RN, intake good,m not requiring IV  ROS: Patient denies  CP, SOB, N/V/D    Objective:   No results found. Recent Labs    11/16/19 1359  WBC 9.2  HGB 7.4*  HCT 23.7*  PLT 328   Recent Labs    11/16/19 1400  NA 132*  K 4.2  CL 95*  CO2 26  GLUCOSE 111*  BUN 58*  CREATININE 9.72*  CALCIUM 11.9*    Intake/Output Summary (Last 24 hours) at 11/19/2019 0854 Last data filed at 11/19/2019 0533 Gross per 24 hour  Intake 533 ml  Output 875 ml  Net -342 ml        Physical Exam: Vital Signs Blood pressure 119/78, pulse 98, temperature 97.7 F (36.5 C), resp. rate 18, height 5' 11"  (1.803 m), weight 99.2 kg, SpO2 100 %.  Constitutional: No distress . Vital signs reviewed. HEENT: EOMI, oral membranes moist Neck: supple Cardiovascular: RRR without murmur. No JVD    Respiratory/Chest: CTA Bilaterally without wheezes or rales. Normal effort    GI/Abdomen: BS +, non-tender, non-distended Ext: no clubbing, cyanosis, or edema Psych: pleasant and cooperative Skin: No evidence of breakdown, no evidence of rash , motor strength is 5/5 in left deltoid, bicep, tricep, grip, hip flexor, knee extensors, ankle dorsiflexor and plantar flexor 3/5 in the right deltoid bicep tricep grip, 0-1/5 RLE Sensory exam normal sensation to light touch  bilateral upper and left lower extremities, right lower extremity no sensation below the ankle Musculoskeletal: Full range of motion in all 4 extremities. No joint swelling  Assessment/Plan: 1. Functional deficits secondary to anoxic strokes with rhabdomyolysis right upper and right lower limb causing weakness which require 3+ hours per day of interdisciplinary therapy in a comprehensive inpatient rehab setting.  Physiatrist is providing close team supervision and 24 hour management of active  medical problems listed below.  Physiatrist and rehab team continue to assess barriers to discharge/monitor patient progress toward functional and medical goals  Care Tool:  Bathing    Body parts bathed by patient: Right arm, Left arm, Chest, Abdomen, Front perineal area, Right upper leg, Left upper leg, Face, Buttocks   Body parts bathed by helper: Right lower leg, Left lower leg     Bathing assist Assist Level: Contact Guard/Touching assist     Upper Body Dressing/Undressing Upper body dressing   What is the patient wearing?: Pull over shirt    Upper body assist Assist Level: Set up assist    Lower Body Dressing/Undressing Lower body dressing      What is the patient wearing?: Underwear/pull up, Pants     Lower body assist Assist for lower body dressing: Contact Guard/Touching assist     Toileting Toileting    Toileting assist Assist for toileting: Contact Guard/Touching assist     Transfers Chair/bed transfer  Transfers assist     Chair/bed transfer assist level: Supervision/Verbal cueing     Locomotion Ambulation   Ambulation assist      Assist level: Supervision/Verbal cueing Assistive device: Walker-rolling Max distance: 100   Walk 10 feet activity   Assist     Assist level: Supervision/Verbal cueing Assistive device: Walker-rolling   Walk 50 feet activity   Assist Walk 50 feet with 2 turns activity did not occur: Safety/medical concerns  Assist level: Supervision/Verbal cueing Assistive device: Walker-rolling    Walk 150 feet activity  Assist Walk 150 feet activity did not occur: Safety/medical concerns  Assist level: Supervision/Verbal cueing      Walk 10 feet on uneven surface  activity   Assist Walk 10 feet on uneven surfaces activity did not occur: Safety/medical concerns         Wheelchair     Assist Will patient use wheelchair at discharge?: Yes (Per PT goals ) Type of Wheelchair: Manual     Wheelchair assist level: Supervision/Verbal cueing Max wheelchair distance: 150    Wheelchair 50 feet with 2 turns activity    Assist        Assist Level: Supervision/Verbal cueing   Wheelchair 150 feet activity     Assist      Assist Level: Supervision/Verbal cueing   Blood pressure 119/78, pulse 98, temperature 97.7 F (36.5 C), resp. rate 18, height 5' 11"  (1.803 m), weight 99.2 kg, SpO2 100 %.  Medical Problem List and Plan: 1.Right hemiparesis and generalized weaknesssecondary to rhabdomyolysis with neuropraxic peripheral nerve injury. Pt also with punctate occipital lobe and cerebellar infarcts as well as globi pallidi insults----potentially d/t anoxic injury -patient may shower -ELOS/Goals: 9/29, HD day , now planning 9/30 2. Antithrombotics: -DVT/anticoagulation:Pharmaceutical:Heparin -antiplatelet therapy: 65m daily 3. Pain Management:D/c dilaudid--has been using 2.5 mg/day--will transition to oxycodone prn.  Patient is doing well on current medications. Pain is well controlled 9/21- taking oxy IR 540mper day- do not plan to discharge home on opioid   -9/25 still using oxy IR q6 hr -  gabapentin for neuropathic pain RLE 4. Mood:LCSW to follow for evaluation and support. -antipsychotic agents: N/A 5. Neuropsych: This patientiscapable of making decisions on hisown behalf. 6. Skin/Wound Care:Routine pressure relief measures, local wound care asneeded. Retrograde massage to R hand  7. Fluids/Electrolytes/Nutrition:Strict I/O. D/C IV 8. AKI with rhabdomyolysis: CK- 2456 slowly coming down. HD dependent MWF- CLIP pending.  9. Crohn's disease: Remicade on hold.  10. Abnormal LFTs: Resolving with AST 9137-->57. Recheck LFTs in am.  11. Polysubstance abuse: Has completed CIWA protocol.  12. RUE/RLEweakness withedema and sensory loss,neuropraxia: -PROM/AROM, edema  control -outpt EMG/NCS potentially 13. Leucocytosis: Trending downward.  14. Anemia:Supplement with IV iron X 3 and Aranesp ordered. 15.Hypocalcemia: Treated with multiple doses IV calcium gluconate--now on CaCO3 bid.  9/21: hypercalcemic today- nephro contacted.   16.  Constipation:   -had large bm 9/24  -continue scheduled miralax and senna-s LOS: [place] 11/19/2019, 8:54 AM

## 2019-11-19 NOTE — Progress Notes (Signed)
Patient ID: [name], male   DOB: 22-May-1970, 49 y.o.   MRN: [id number] [facility] Progress Note   Assessment/ Plan:   1. Acute kidney Injury: Appears to be multifactorial from rhabdomyolysis and ischemic ATN (hypotension/hypovolemia) in patient with preceding cocaine use.  Started on hemodialysis via [facility] on 10/31/2019 and although nonoliguric, he does not have any evidence of renal recovery based on labs.  We will continue to follow labs/clinical status for potential renal recovery versus long-term dialysis upon discharge. 2.  Rhabdomyolysis: Improving clinically along with extracorporeal myoglobin removal with HD. 3.  Hyponatremia: Secondary to acute kidney injury and impaired free water excretion, continue fluid restriction with renal diet. 4.  History of Crohn's disease: Appears to be clinically stable on Remicade. 5.  Anemia of chronic disease versus acute illness/AKI: Status post intravenous iron and on ESA.  Subjective:   Denies any acute complaints at this time.   Objective:   BP 119/78 (BP Location: Left Arm)   Pulse 98   Temp 97.7 F (36.5 C)   Resp 18   Ht 5' 11"  (1.803 m)   Wt 99.2 kg   SpO2 100%   BMI 30.52 kg/m   Intake/Output Summary (Last 24 hours) at 11/19/2019 1121 Last data filed at 11/19/2019 1100 Gross per 24 hour  Intake 533 ml  Output 975 ml  Net -442 ml   Weight change: -2.353 kg  Physical Exam: Gen: Comfortably sitting up in wheelchair, working with therapist CVS: Pulse regular rhythm, normal rate, S1 and S2 normal Resp: Diminished breath sounds over bases, no distinct rales or rhonchi Abd: Soft, flat, nontender Ext: Trace LE edema with trace edema right upper extremity.  Imaging: No results found.  Labs: BMET Recent Labs  Lab 11/12/19 1132 11/13/19 0636 11/15/19 1049 11/16/19 1400  NA 134* 134* 134* 132*  K 4.4 5.3* 4.3 4.2  CL 96* 97* 97* 95*  CO2 26 27 24 26   GLUCOSE 120* 116* 103* 111*  BUN 57* 55* 39* 58*   CREATININE 8.45* 8.80* 7.19* 9.72*  CALCIUM 11.5* 13.8* 12.2* 11.9*  PHOS 7.0* 9.6* 7.5* 8.3*   CBC Recent Labs  Lab 11/13/19 0636 11/16/19 1359  WBC 10.8* 9.2  NEUTROABS 7.6  --   HGB 8.6* 7.4*  HCT 27.9* 23.7*  MCV 96.5 96.0  PLT 360 328    Medications:    . aspirin EC  81 mg Oral Daily  . Chlorhexidine Gluconate Cloth  6 each Topical Daily  . Chlorhexidine Gluconate Cloth  6 each Topical Q0600  . gabapentin  100 mg Oral TID  . heparin  5,000 Units Subcutaneous Q8H  . metoprolol tartrate  12.5 mg Oral BID  . polyethylene glycol  17 g Oral BID  . senna-docusate  3 tablet Oral BID   Elmarie Shiley, MD 11/19/2019, 11:21 AM

## 2019-11-19 NOTE — Progress Notes (Signed)
CRITICAL VALUE ALERT  Critical Value:  Hg 6.8  Date & Time Notied: 11/19/2019 1540  Provider Notified: Called to HD- Pt currently transfusing   Orders Received/Actions taken: Awaiting further orders

## 2019-11-19 NOTE — Progress Notes (Signed)
Patient ID: [name], male   DOB: [birth year], 49 y.o.   MRN: [id number]   [facility] has agreed to accept patient for PT and OT follow up.   [place], [place]

## 2019-11-19 NOTE — Progress Notes (Signed)
Patient ID: [name], male   DOB: 09/04/1970, 49 y.o.   MRN: [id number]  Sending patient referral out for [facility]. HH not guaranteed due to payer source. Will continue to attempt and follow up.

## 2019-11-19 NOTE — Progress Notes (Signed)
Patient ID: [name], male   DOB: [birth year], 49 y.o.   MRN: [id number]   Rolling Walker ordered through [place].

## 2019-11-20 ENCOUNTER — Inpatient Hospital Stay (HOSPITAL_COMMUNITY): Payer: BC Managed Care – PPO | Admitting: Occupational Therapy

## 2019-11-20 ENCOUNTER — Inpatient Hospital Stay (HOSPITAL_COMMUNITY): Payer: BC Managed Care – PPO | Admitting: Speech Pathology

## 2019-11-20 ENCOUNTER — Inpatient Hospital Stay (HOSPITAL_COMMUNITY): Payer: BC Managed Care – PPO | Admitting: Physical Therapy

## 2019-11-20 MED ORDER — DARBEPOETIN ALFA 100 MCG/0.5ML IJ SOSY
100.0000 ug | PREFILLED_SYRINGE | INTRAMUSCULAR | Status: DC
  Administered 2019-11-21: 100 ug via INTRAVENOUS
  Filled 2019-11-20: qty 0.5

## 2019-11-20 MED ORDER — HYDROCODONE-ACETAMINOPHEN 5-325 MG PO TABS
1.0000 | ORAL_TABLET | Freq: Four times a day (QID) | ORAL | Status: DC | PRN
  Administered 2019-11-20 – 2019-11-21 (×4): 1 via ORAL
  Filled 2019-11-20 (×4): qty 1

## 2019-11-20 NOTE — Progress Notes (Signed)
Speech Language Pathology Discharge Summary  Patient Details  Name: [name] MRN: [id number] Date of Birth: 04-03-1970  Patient has met 3 of 3 long term goals.  Patient to discharge at overall Independent level.  Reasons goals not met: n/a   Clinical Impression/Discharge Summary:   Pt made functional gains and met 3 out of 3 long term goals this admission. Pt is now independent with mildly complex to complex familiar/functional tasks. Pt has demonstrated improved ability to use compensatory aids and/or strategies for short term recall of new information, problem solving, and alternating attention. Given independent status no further skilled ST services are indicated upon discharge. Pt education is complete at this time.   Care Partner:  Caregiver Able to Provide Assistance: Yes  Type of Caregiver Assistance: Cognitive;Physical  Recommendation:  None  Rationale for SLP Follow Up: Other (comment) (n/a)   Equipment: none   Reasons for discharge: Discharged from hospital   Patient/Family Agrees with Progress Made and Goals Achieved: Yes    Arbutus Leas 11/21/2019, 11:59 AM

## 2019-11-20 NOTE — Progress Notes (Signed)
Physical Therapy Session Note  Patient Details  Name: [name] MRN: [id number] Date of Birth: 19-Sep-1970  Today's Date: 11/20/2019 PT Individual Time: 1000-1100 PT Individual Time Calculation (min): 60 min   Short Term Goals: Week 2:  PT Short Term Goal 1 (Week 2): STG=LTG due to ELOS  Skilled Therapeutic Interventions/Progress Updates: Pt presented in bed agreeable to therapy. Pt denotes some improvement in "soreness" in RLE but still present no numerical assessment provided however rest breaks provided as needed. Performed bed mobility mod I with use of bed features. PTA discussed use of toe up brace vs AFO and discussed benefits of each. As pt currently does not have a shoe large enough to trial AFO agreeable to test toe up. PTA donned shoes with brace for time management. Pt performed ambulatory transfer with RW and supervision. Pt transferred to day room for energy conservation. Performed ambulatory transfer to NuStep and participated in L4 NuStep x 8 min for general conditioning. While pt on NuStep discussed possibility of doing AFO consult on outpatient basis with MD order and pt was agreeable. Discussed with Gerald Stabs from [facility] to verify that this is an option who verified is possible with MD order. Once completed pt transferred back to w/c and transported to rehab gym. Pt participated in [employer] with improved score of 35/56 from initial score of 23/56 (<36= high risk for falls, close to 100%; 37-45 significant >80%; 46-51 moderate >50%; 52-55 lower >25%). Pt also participated in TUG with improved score of 27.5 from 46 sec however still indicative of increased falls risk. Participated in gait training with toe up brace x 183f with pt demonstrating increased compensatory mechanics to allow for foot clearance after x 2 occurences of R toe catching. Pt transported back to room and performed ambulatory transfer to bed with PTA removing shoes and noted no increased rubbing/redness on R  foot from brace. Pt returned to bed mod I with use of leg lifter and positioned self to comfort. Pt left in bed at end of session with bed alarm on, call bell within reach and needs met.     Therapy Documentation Precautions:  Precautions Precautions: Fall Precaution Comments: foot drop on RLE Restrictions Weight Bearing Restrictions: No General:   Vital Signs:   Pain: Pain Assessment Pain Scale: Faces Pain Score: 6  Faces Pain Scale: No hurt Mobility: Bed Mobility Bed Mobility: Rolling Right;Rolling Left;Sit to Supine;Supine to Sit Rolling Right: Independent Rolling Left: Independent Supine to Sit: Independent with assistive device Sit to Supine: Independent with assistive device Transfers Transfers: Sit to Stand;Stand to Sit;Stand Pivot Transfers Sit to Stand: Independent with assistive device Stand to Sit: Independent with assistive device Stand Pivot Transfers: Independent with assistive device Transfer (Assistive device): Rolling walker Locomotion :    Trunk/Postural Assessment : Cervical Assessment Cervical Assessment: Exceptions to WGolden Ridge Surgery CenterThoracic Assessment Thoracic Assessment: Exceptions to WSan Juan Regional Rehabilitation HospitalLumbar Assessment Lumbar Assessment: Exceptions to WSurgical Eye Experts LLC Dba Surgical Expert Of New England LLCPostural Control Postural Control: Deficits on evaluation  Balance: Balance Balance Assessed: Yes Timed Up and Go Test TUG: Normal TUG Normal TUG (seconds): 27.5 (x2 trials, 24 sec and 31 sec) Dynamic Sitting Balance Dynamic Sitting - Balance Support: No upper extremity supported;Feet supported Dynamic Sitting - Level of Assistance: 7: Independent Static Standing Balance Static Standing - Balance Support: No upper extremity supported Static Standing - Level of Assistance: 5: Stand by assistance Dynamic Standing Balance Dynamic Standing - Balance Support: Bilateral upper extremity supported Dynamic Standing - Level of Assistance: 5: Stand by assistance Dynamic Standing -  Balance Activities: Reaching for  objects;Reaching across midline Exercises:   Other Treatments:      Therapy/Group: Individual Therapy  Malicia Blasdel 11/20/2019, 3:41 PM

## 2019-11-20 NOTE — Progress Notes (Signed)
Patient ID: [name], male   DOB: [birth year], 49 y.o.   MRN: [id number]   Sw followed up with patient, has RW. Will cancel order with [employer].   [place], [place]

## 2019-11-20 NOTE — Plan of Care (Signed)
  Problem: Consults Goal: RH STROKE PATIENT EDUCATION Description: See Patient Education module for education specifics  Outcome: Progressing   Problem: RH BOWEL ELIMINATION Goal: RH STG MANAGE BOWEL WITH ASSISTANCE Description: STG Manage Bowel with Mod I Assistance. Outcome: Progressing Goal: RH STG MANAGE BOWEL W/MEDICATION W/ASSISTANCE Description: STG Manage Bowel with Medication with Mod I Assistance. Outcome: Progressing   Problem: RH BLADDER ELIMINATION Goal: RH STG MANAGE BLADDER WITH ASSISTANCE Description: STG Manage Bladder With Mod I Assistance Outcome: Progressing   Problem: RH SKIN INTEGRITY Goal: RH STG MAINTAIN SKIN INTEGRITY WITH ASSISTANCE Description: STG Maintain Skin Integrity With Mod I Assistance. Outcome: Progressing Goal: RH STG ABLE TO PERFORM INCISION/WOUND CARE W/ASSISTANCE Description: STG Able To Perform Incision/Wound Care With Mod I Assistance. Outcome: Progressing   Problem: RH SAFETY Goal: RH STG ADHERE TO SAFETY PRECAUTIONS W/ASSISTANCE/DEVICE Description: STG Adhere to Safety Precautions With Mod I Assistance and appropriate assistive Device. Outcome: Progressing

## 2019-11-20 NOTE — Progress Notes (Signed)
Patient ID: [name], male   DOB: [birth year], 49 y.o.   MRN: [id number] [facility] Progress Note   Assessment/ Plan:   1. Acute kidney Injury: Appears to be multifactorial from rhabdomyolysis and ischemic ATN (hypotension/hypovolemia) in patient with preceding cocaine use.  Started on hemodialysis via [place] on 10/31/2019 and remains non oliguric, will check labs early tomorrow to decide on need for dialysis.  He so far has had decent UOP without meaningful functional recovery. 2.  Rhabdomyolysis: Improving clinically along with extracorporeal myoglobin removal with HD. 3.  Hyponatremia: Secondary to acute kidney injury and impaired free water excretion, continue fluid restriction with renal diet. 4.  History of Crohn's disease: Appears to be clinically stable on Remicade. 5.  Anemia of chronic disease versus acute illness/AKI: Restarted ESA and will recheck iron strores.  Subjective:   Denies any acute complaints at this time.   Objective:   BP 121/61 (BP Location: Left Arm)   Pulse 91   Temp 98.2 F (36.8 C)   Resp 18   Ht 5' 11"  (1.803 m)   Wt 97.5 kg   SpO2 98%   BMI 29.98 kg/m   Intake/Output Summary (Last 24 hours) at 11/20/2019 1238 Last data filed at 11/20/2019 0900 Gross per 24 hour  Intake 351 ml  Output 2600 ml  Net -2249 ml   Weight change: -1.747 kg  Physical Exam: Gen: Comfortably resting in bed watching television CVS: Pulse regular rhythm, normal rate, S1 and S2 normal Resp: Diminished breath sounds over bases, no distinct rales or rhonchi Abd: Soft, flat, nontender Ext: Trace LE edema with trace edema right upper extremity.  Imaging: No results found.  Labs: BMET Recent Labs  Lab 11/15/19 1049 11/16/19 1400 11/19/19 1409  NA 134* 132* 134*  K 4.3 4.2 3.5  CL 97* 95* 97*  CO2 24 26 26   GLUCOSE 103* 111* 105*  BUN 39* 58* 44*  CREATININE 7.19* 9.72* 10.15*  CALCIUM 12.2* 11.9* 8.6*  PHOS 7.5* 8.3* 6.1*   CBC Recent Labs  Lab  11/16/19 1359 11/19/19 1409 11/19/19 1618  WBC 9.2 7.2 6.5  HGB 7.4* 6.8* 7.0*  HCT 23.7* 22.3* 22.5*  MCV 96.0 95.3 95.3  PLT 328 327 310    Medications:    . aspirin EC  81 mg Oral Daily  . Chlorhexidine Gluconate Cloth  6 each Topical Daily  . Chlorhexidine Gluconate Cloth  6 each Topical Q0600  . [START ON 11/21/2019] darbepoetin (ARANESP) injection - DIALYSIS  100 mcg Intravenous Q Wed-HD  . gabapentin  100 mg Oral TID  . metoprolol tartrate  12.5 mg Oral BID  . polyethylene glycol  17 g Oral BID  . senna-docusate  3 tablet Oral BID   Elmarie Shiley, MD 11/20/2019, 12:38 PM

## 2019-11-20 NOTE — Progress Notes (Signed)
Occupational Therapy Session Note  Patient Details  Name: [name] MRN: [id number] Date of Birth: 05/30/1970  Today's Date: 11/20/2019 OT Individual Time: 4276-7011 OT Individual Time Calculation (min): 71 min    Short Term Goals: Week 2:  OT Short Term Goal 2 (Week 2): STG = LTG 2/2 ELOS  Skilled Therapeutic Interventions/Progress Updates:    Pt supine in bed, c/o 5/10 pain but agreeable to OT session.   Pt reports he already bathed this morning with a nurse tech but would like to change into clean pants.  Pt completed LB dressing with CGA to pull over hips and using reacher as needed.  Pt donned socks with setup.  Pt needing min assist to donn right shoe due to tight fit.  Setup to donn left shoe.  Pt requesting to use bathroom.  Toileting completed needing CGA during standing for clothing mgt.  Pt completed all functional mobility with CGA throughout session including supine to sit, SPT using RW EOB<>w/c, ambulation to bathroom and return to EOB, and all fall recovery mobility including rolling right and left on mat table, supine<> sidelying, sidelying<>quadraped, quadraped<>high kneel, high kneel<>standing using EOM.  Pt educated on fall recovery techniques and safe body mechanics at mat table and floor mat.  Pt demonstrated good carryover of techniques and completed each step of fall recovery mechanics 2-3 times.  Pt returned to room and requesting back to bed.  Pt completed short distance ambulation to EOB with CGA and VCs for safe hand placement.  CGA sit to supine.  Call bell in reach, bed alarm on.    Therapy Documentation Precautions:  Precautions Precautions: Fall Precaution Comments: foot drop on RLE Restrictions Weight Bearing Restrictions: No   Therapy/Group: Individual Therapy  Ezekiel Slocumb 11/20/2019, 4:20 PM

## 2019-11-20 NOTE — Progress Notes (Signed)
Speech Language Pathology Daily Session Note  Patient Details  Name: NICKOLAS CHALFIN MRN: [id number] Date of Birth: [birth year]  Today's Date: 11/20/2019 SLP Individual Time: 5465-6812 SLP Individual Time Calculation (min): 55 min  Short Term Goals: Week 1: SLP Short Term Goal 1 (Week 1): Pt will complete complex tasks with mod I functional problem solving. SLP Short Term Goal 2 (Week 1): Pt will recall new, complex information with mod I use of compensatory strategies. SLP Short Term Goal 3 (Week 1): Pt will alternate his attention between 2 mildly complex tasks with mod I.  Skilled Therapeutic Interventions: Pt was seen for skilled ST targeting cognitive skills. Pt completed a complex daily scheduling and prioritization task with 100% accuracy; he independent for problem solving and organization, Mod I for recall within task. SLP also provided activities targeting alternating attention. He alternated attention between a novel semi-complex card task and complex word search for 30 mins independently - no cues required for redirection after initial explanation of task provided. He was also independent with recall for semi-complex rules of card task. Pt left sitting in wheelchair with alarm set and needs within reach. Continue per current plan of care.          Pain Pain Assessment Pain Scale: Faces Faces Pain Scale: No hurt  Therapy/Group: Individual Therapy  Arbutus Leas 11/20/2019, 12:24 PM

## 2019-11-20 NOTE — Progress Notes (Signed)
Physical Therapy Discharge Summary  Patient Details  Name: [name] MRN: [id number] Date of Birth: Feb 25, 1970  Today's Date: 9/98/2021    Patient has met 10 of 10 long term goals due to improved activity tolerance, improved balance, improved postural control, increased strength, improved attention, improved awareness and improved coordination.  Patient to discharge at an ambulatory level Supervision.   Patient's care partner in independent  to provide the necessary physical assistance at discharge.  Reasons goals not met: N/A all goals met  Recommendation:  Patient will benefit from ongoing skilled PT services in home health setting to continue to advance safe functional mobility, address ongoing impairments in balance, foot drop, gait, safety, community mobility , and minimize fall risk.  Equipment: RW  Reasons for discharge: treatment goals met  Patient/family agrees with progress made and goals achieved: Yes  PT Discharge Precautions/Restrictions Precautions Precautions: Fall Precaution Comments: foot drop on RLE Restrictions Weight Bearing Restrictions: No    Pain Pain Assessment Pain Scale: Faces Pain Score: 6  Faces Pain Scale: No hurt Pain Type: Acute pain Pain Location: Hand Pain Orientation: Right Pain Descriptors / Indicators: Pins and needles;Tingling Pain Frequency: Intermittent Pain Onset: On-going Patients Stated Pain Goal: 0 Pain Intervention(s): Medication (See eMAR) Multiple Pain Sites: Yes 2nd Pain Site Pain Score: 7 Pain Type: Acute pain Pain Location: Leg Pain Orientation: Right Pain Descriptors / Indicators: Pins and needles;Tingling Pain Frequency: Intermittent Pain Onset: On-going Patient's Stated Pain Goal: 0 Pain Intervention(s): Medication (See eMAR)    Cognition Overall Cognitive Status: Impaired/Different from baseline Arousal/Alertness: Awake/alert Orientation Level: Oriented X4 Sensation Sensation Light Touch: Impaired  Detail Light Touch Impaired Details: Impaired RUE;Impaired RLE Motor  Motor Motor: Hemiplegia Motor - Discharge Observations: Improved ataxia and more functional use of R hemi body  Mobility Bed Mobility Bed Mobility: Rolling Right;Rolling Left;Sit to Supine;Supine to Sit Rolling Right: Independent Rolling Left: Independent Supine to Sit: Independent with assistive device Sit to Supine: Independent with assistive device Transfers Transfers: Sit to Stand;Stand to Sit;Stand Pivot Transfers Sit to Stand: Independent with assistive device Stand to Sit: Independent with assistive device Stand Pivot Transfers: Independent with assistive device Transfer (Assistive device): Rolling walker Locomotion  Gait Ambulation: Yes Gait Assistance: Supervision/Verbal cueing Gait Distance (Feet): 164 Feet Assistive device: Rolling walker Gait Gait: Yes Gait Pattern: Impaired Gait Pattern: Step-to pattern;Abducted- right;Wide base of support;Lateral hip instability;Right foot flat;Poor foot clearance - right Stairs / Additional Locomotion Stairs: Yes Stairs Assistance: Supervision/Verbal cueing Stair Management Technique: Two rails Number of Stairs: 12 Height of Stairs: 6 Ramp: Supervision/Verbal cueing Wheelchair Mobility Wheelchair Mobility: No  Trunk/Postural Assessment  Cervical Assessment Cervical Assessment: Exceptions to [facility] Thoracic Assessment Thoracic Assessment: Exceptions to [facility] Lumbar Assessment Lumbar Assessment: Exceptions to [facility] Postural Control Postural Control: Deficits on evaluation  Balance Balance Balance Assessed: Yes Berg Balance Test Sit to Stand: Able to stand  independently using hands Standing Unsupported: Able to stand safely 2 minutes Sitting with Back Unsupported but Feet Supported on Floor or Stool: Able to sit safely and securely 2 minutes Stand to Sit: Controls descent by using hands Transfers: Able to transfer safely, definite need of hands Standing  Unsupported with Eyes Closed: Able to stand 10 seconds with supervision Standing Ubsupported with Feet Together: Able to place feet together independently and stand for 1 minute with supervision From Standing, Reach Forward with Outstretched Arm: Can reach forward >12 cm safely (5") From Standing Position, Pick up Object from Floor: Able to pick up shoe, needs supervision  From Standing Position, Turn to Look Behind Over each Shoulder: Turn sideways only but maintains balance Turn 360 Degrees: Needs close supervision or verbal cueing Standing Unsupported, Alternately Place Feet on Step/Stool: Able to complete >2 steps/needs minimal assist Standing Unsupported, One Foot in Front: Able to take small step independently and hold 30 seconds Standing on One Leg: Unable to try or needs assist to prevent fall Total Score: 35 Timed Up and Go Test TUG: Normal TUG Dynamic Sitting Balance Dynamic Sitting - Balance Support: No upper extremity supported;Feet supported Dynamic Sitting - Level of Assistance: 7: Independent Static Standing Balance Static Standing - Balance Support: No upper extremity supported Static Standing - Level of Assistance: 5: Stand by assistance Dynamic Standing Balance Dynamic Standing - Balance Support: Bilateral upper extremity supported Dynamic Standing - Level of Assistance: 5: Stand by assistance Dynamic Standing - Balance Activities: Reaching for objects;Reaching across midline Extremity Assessment  RUE Assessment RUE Assessment: Exceptions to [facility] General Strength Comments: improved edema since eval, 3+/5 throughout RUE Body System: Neuro Brunstrum levels for arm and hand: Arm;Hand Brunstrum level for arm: Stage V Relative Independence from Synergy Brunstrum level for hand: Stage VI Isolated joint movements LUE Assessment LUE Assessment: Within Functional Limits RLE Assessment RLE Assessment: Exceptions to [facility] RLE Strength Right Hip Flexion: 4-/5 Right Hip  Extension: 4-/5 Right Hip ABduction: 4/5 Right Hip ADduction: 3/5 Right Knee Flexion: 2-/5 Right Knee Extension: 4/5 Right Ankle Dorsiflexion: 0/5 Right Ankle Plantar Flexion: 0/5 Right Ankle Inversion: 0/5 Right Ankle Eversion: 0/5      Rosita DeChalus 11/20/2019, 12:24 PM

## 2019-11-20 NOTE — Progress Notes (Signed)
[place] PHYSICAL MEDICINE & REHABILITATION PROGRESS NOTE   Subjective/Complaints:  No abd pain, still unable to move right foot, discussed nerve injury and potential need for EMG if no spontaneous recovery   ROS: Patient denies  CP, SOB, N/V/D    Objective:   No results found. Recent Labs    11/19/19 1409 11/19/19 1618  WBC 7.2 6.5  HGB 6.8* 7.0*  HCT 22.3* 22.5*  PLT 327 310   Recent Labs    11/19/19 1409  NA 134*  K 3.5  CL 97*  CO2 26  GLUCOSE 105*  BUN 44*  CREATININE 10.15*  CALCIUM 8.6*    Intake/Output Summary (Last 24 hours) at 11/20/2019 0814 Last data filed at 11/20/2019 0303 Gross per 24 hour  Intake 231 ml  Output 2900 ml  Net -2669 ml        Physical Exam: Vital Signs Blood pressure 121/61, pulse 91, temperature 98.2 F (36.8 C), resp. rate 20, height 5' 11"  (1.803 m), weight 97.5 kg, SpO2 95 %.  General: No acute distress Mood and affect are appropriate Heart: Regular rate and rhythm no rubs murmurs or extra sounds Lungs: Clear to auscultation, breathing unlabored, no rales or wheezes Abdomen: Positive bowel sounds, soft nontender to palpation, nondistended Extremities: No clubbing, cyanosis, or edema Skin: No evidence of breakdown, no evidence of rash   , motor strength is 5/5 in left deltoid, bicep, tricep, grip, hip flexor, knee extensors, ankle dorsiflexor and plantar flexor 3/5 in the right deltoid bicep tricep grip, 0-1/5 RLE Sensory exam normal sensation to light touch  bilateral upper and left lower extremities, right lower extremity no sensation below the ankle Musculoskeletal: Full range of motion in all 4 extremities. No joint swelling  Assessment/Plan: 1. Functional deficits secondary to anoxic strokes with rhabdomyolysis right upper and right lower limb causing weakness which require 3+ hours per day of interdisciplinary therapy in a comprehensive inpatient rehab setting.  Physiatrist is providing close team supervision  and 24 hour management of active medical problems listed below.  Physiatrist and rehab team continue to assess barriers to discharge/monitor patient progress toward functional and medical goals  Care Tool:  Bathing    Body parts bathed by patient: Right arm, Left arm, Chest, Abdomen, Front perineal area, Right upper leg, Left upper leg, Face, Buttocks   Body parts bathed by helper: Right lower leg, Left lower leg     Bathing assist Assist Level: Contact Guard/Touching assist     Upper Body Dressing/Undressing Upper body dressing   What is the patient wearing?: Pull over shirt    Upper body assist Assist Level: Set up assist    Lower Body Dressing/Undressing Lower body dressing      What is the patient wearing?: Underwear/pull up, Pants     Lower body assist Assist for lower body dressing: Contact Guard/Touching assist     Toileting Toileting    Toileting assist Assist for toileting: Contact Guard/Touching assist     Transfers Chair/bed transfer  Transfers assist     Chair/bed transfer assist level: Supervision/Verbal cueing     Locomotion Ambulation   Ambulation assist      Assist level: Supervision/Verbal cueing Assistive device: Walker-rolling Max distance: 100   Walk 10 feet activity   Assist     Assist level: Supervision/Verbal cueing Assistive device: Walker-rolling   Walk 50 feet activity   Assist Walk 50 feet with 2 turns activity did not occur: Safety/medical concerns  Assist level: Supervision/Verbal cueing Assistive device:  Walker-rolling    Walk 150 feet activity   Assist Walk 150 feet activity did not occur: Safety/medical concerns  Assist level: Supervision/Verbal cueing      Walk 10 feet on uneven surface  activity   Assist Walk 10 feet on uneven surfaces activity did not occur: Safety/medical concerns         Wheelchair     Assist Will patient use wheelchair at discharge?: Yes (Per PT goals ) Type of  Wheelchair: Manual    Wheelchair assist level: Supervision/Verbal cueing Max wheelchair distance: 150    Wheelchair 50 feet with 2 turns activity    Assist        Assist Level: Supervision/Verbal cueing   Wheelchair 150 feet activity     Assist      Assist Level: Supervision/Verbal cueing   Blood pressure 121/61, pulse 91, temperature 98.2 F (36.8 C), resp. rate 20, height 5' 11"  (1.803 m), weight 97.5 kg, SpO2 95 %.  Medical Problem List and Plan: 1.Right hemiparesis and generalized weaknesssecondary to rhabdomyolysis with neuropraxic peripheral nerve injury. Pt also with punctate occipital lobe and cerebellar infarcts as well as globi pallidi insults----potentially d/t anoxic injury -patient may shower, team conf in am  -ELOS/Goals: 9/29, HD day , now planning 9/30 Outpt EMG in ~88moPMR  2. Antithrombotics: -DVT/anticoagulation:Pharmaceutical:Heparin- amb distance 1028fwill d/c sq hep -antiplatelet therapy: 815maily 3. Pain Management:D/c dilaudid--has been using 2.5 mg/day--will transition to oxycodone prn.  Patient is doing well on current medications. Pain is well controlled 9/21- taking oxy IR 5mg48mr day TID change to Hydrocodone- do not plan to discharge home on opioid    -  gabapentin for neuropathic pain RLE 4. Mood:LCSW to follow for evaluation and support. -antipsychotic agents: N/A 5. Neuropsych: This patientiscapable of making decisions on hisown behalf. 6. Skin/Wound Care:Routine pressure relief measures, local wound care asneeded. Retrograde massage to R hand  7. Fluids/Electrolytes/Nutrition:Strict I/O. D/C IV 8. AKI with rhabdomyolysis: CK- 2456 slowly coming down. HD dependent MWF- CLIP pending.  9. Crohn's disease: Remicade on hold.  10. Abnormal LFTs: Resolving with AST 9137-->57. Recheck LFTs in am.  11. Polysubstance abuse: Has completed CIWA protocol.  12.  RUE/RLEweakness withedema and sensory loss,neuropraxia: -PROM/AROM, edema control -outpt EMG/NCS potentially 13. Leucocytosis: Trending downward.  14. Anemia:Supplement with IV iron X 3 and Aranesp ordered.DC sq hep- f/u renal  15.Hypocalcemia: Treated with multiple doses IV calcium gluconate--now on CaCO3 bid.  9/21: hypercalcemic today- nephro contacted.   16.  Constipation:   -had large bm 9/24  -continue scheduled miralax and senna-s LOS: 11 days A FACE TO FACEMcCarrirsteins 11/20/2019, 8:14 AM

## 2019-11-21 ENCOUNTER — Inpatient Hospital Stay (HOSPITAL_COMMUNITY): Payer: BC Managed Care – PPO | Admitting: Physical Therapy

## 2019-11-21 ENCOUNTER — Inpatient Hospital Stay (HOSPITAL_COMMUNITY): Payer: BC Managed Care – PPO | Admitting: Speech Pathology

## 2019-11-21 ENCOUNTER — Inpatient Hospital Stay (HOSPITAL_COMMUNITY): Payer: BC Managed Care – PPO

## 2019-11-21 LAB — CBC
HCT: 22.5 % — ABNORMAL LOW (ref 39.0–52.0)
Hemoglobin: 6.9 g/dL — CL (ref 13.0–17.0)
MCH: 29 pg (ref 26.0–34.0)
MCHC: 30.7 g/dL (ref 30.0–36.0)
MCV: 94.5 fL (ref 80.0–100.0)
Platelets: 312 10*3/uL (ref 150–400)
RBC: 2.38 MIL/uL — ABNORMAL LOW (ref 4.22–5.81)
RDW: 13.5 % (ref 11.5–15.5)
WBC: 6.4 10*3/uL (ref 4.0–10.5)
nRBC: 0 % (ref 0.0–0.2)

## 2019-11-21 LAB — RENAL FUNCTION PANEL
Albumin: 2.5 g/dL — ABNORMAL LOW (ref 3.5–5.0)
Anion gap: 13 (ref 5–15)
BUN: 29 mg/dL — ABNORMAL HIGH (ref 6–20)
CO2: 26 mmol/L (ref 22–32)
Calcium: 7.7 mg/dL — ABNORMAL LOW (ref 8.9–10.3)
Chloride: 96 mmol/L — ABNORMAL LOW (ref 98–111)
Creatinine, Ser: 8.44 mg/dL — ABNORMAL HIGH (ref 0.61–1.24)
GFR calc Af Amer: 8 mL/min — ABNORMAL LOW (ref >60)
GFR calc non Af Amer: 7 mL/min — ABNORMAL LOW (ref >60)
Glucose, Bld: 98 mg/dL (ref 70–99)
Phosphorus: 5.1 mg/dL — ABNORMAL HIGH (ref 2.5–4.6)
Potassium: 3.6 mmol/L (ref 3.5–5.1)
Sodium: 135 mmol/L (ref 135–145)

## 2019-11-21 LAB — PREPARE RBC (CROSSMATCH)

## 2019-11-21 MED ORDER — SODIUM CHLORIDE 0.9% IV SOLUTION: Freq: Once | INTRAVENOUS | Status: DC

## 2019-11-21 MED ORDER — DARBEPOETIN ALFA 100 MCG/0.5ML IJ SOSY
PREFILLED_SYRINGE | INTRAMUSCULAR | Status: AC
  Filled 2019-11-21: qty 0.5

## 2019-11-21 MED ORDER — TRAMADOL HCL 50 MG PO TABS
50.0000 mg | ORAL_TABLET | Freq: Four times a day (QID) | ORAL | Status: DC | PRN
  Administered 2019-11-21: 50 mg via ORAL
  Filled 2019-11-21: qty 1

## 2019-11-21 MED ORDER — HEPARIN SODIUM (PORCINE) 1000 UNIT/ML IJ SOLN
INTRAMUSCULAR | Status: AC
  Filled 2019-11-21: qty 4

## 2019-11-21 MED ORDER — HEPARIN SODIUM (PORCINE) 1000 UNIT/ML DIALYSIS
40.0000 [IU]/kg | INTRAMUSCULAR | Status: DC | PRN
  Administered 2019-11-21: 3900 [IU] via INTRAVENOUS_CENTRAL

## 2019-11-21 NOTE — Progress Notes (Signed)
Physical Therapy Session Note  Patient Details  Name: [name] MRN: [id number] Date of Birth: 07-13-1970  Today's Date: 11/21/2019 PT Individual Time: 9122-5834 PT Individual Time Calculation (min): 71 min   Short Term Goals: Week 2:  PT Short Term Goal 1 (Week 2): STG=LTG due to ELOS  Skilled Therapeutic Interventions/Progress Updates: Pt presented in bed agreeable to therapy. Pt states some "soreness" in RLE however did not rate. Performed bed mobility mod I with use of bed features and PTA donned ace bandage to RLE for DF assist followed by shoes. Performed ambulatory transfer to w/c with supervision. PTA transferred pt to ortho gym for energy conservation and participated in car transfer and ambulation on ramp and mulch. Pt then returned to w/c and propelled to ADL apt and participated in bed mobility and furniture transfers (see care tool for details). PTA provided education on ambulation in home and to be aware of changes in floor (carpet to hardwood/tile) due to pt's foot drop. Pt then propelled to rehab gym and participated in ascending/descending stairs. Pt performed x4 with 1 rail then after seated rest additional x12 with B rails with supervision. Pt transported to day room and ambulated with RW 170f with supervision. Pt noted to have x1 occurrence of R foot catching but pt was able to recover without LOB. Pt then participated in Cybex Kinetron at 50cm/sec performing STS 2 x 10 with mirror feedback and emphasis on maintaining equal wt bearing. Pt also performed standing march at 50cm/sec 3 x 10 cycles. Pt returned to w/c and propelled back to room. Performed ambulatory transfer back to bed and returned to supine mod I. Pt left in bed with alarm on, call bell within reach and needs met.      Therapy Documentation Precautions:  Precautions Precautions: Fall Precaution Comments: foot drop on RLE Restrictions Weight Bearing Restrictions: No General:   Vital Signs:   Pain: Pain  Assessment Pain Scale: Faces Pain Score: 7  Faces Pain Scale: No hurt Pain Type: Acute pain Pain Location: Hand Pain Orientation: Right Pain Radiating Towards: right leg Pain Descriptors / Indicators: Pins and needles Pain Frequency: Intermittent Pain Intervention(s): Medication (See eMAR) Multiple Pain Sites: Yes Mobility:   Locomotion : Gait Ambulation: Yes Gait Assistance: Supervision/Verbal cueing Gait Distance (Feet): 164 Feet Assistive device: Rolling walker Gait Gait: Yes Gait Pattern: Impaired Stairs / Additional Locomotion Stairs: Yes Stairs Assistance: Supervision/Verbal cueing Stair Management Technique: Two rails Number of Stairs: 12 Height of Stairs: 6 Ramp: Supervision/Verbal cueing Wheelchair Mobility Wheelchair Mobility: No    Therapy/Group: Individual Therapy  Panagiota Perfetti  Maddoxx Burkitt, PTA  11/21/2019, 1:00 PM

## 2019-11-21 NOTE — Progress Notes (Signed)
Patient ID: [name], male   DOB: Nov 21, 1970, 49 y.o.   MRN: [id number] [facility] Progress Note   Assessment/ Plan:   1. Acute kidney Injury: Appears to be multifactorial from rhabdomyolysis and ischemic ATN (hypotension/hypovolemia) in patient with preceding cocaine use.  Started on hemodialysis via [place] on 10/31/2019 and remains non oliguric but without any evidence of functional renal recovery to date.  The plan is for hemodialysis today with possible discharge home tomorrow to continue outpatient dialysis on a Monday/Wednesday/Friday schedule beginning this Friday at [facility]. 2.  Rhabdomyolysis: Improving clinically along with extracorporeal myoglobin removal with HD.  Unfortunately, he may have a protracted course for renal recovery. 3.  Hyponatremia: Secondary to acute kidney injury and impaired free water excretion, continue fluid restriction with renal diet. 4.  History of Crohn's disease: Appears to be clinically stable on Remicade. 5.  Anemia of chronic disease versus acute illness/AKI: Restarted ESA and will recheck iron strores.  Subjective:   Denies any acute complaints at this time, had an uneventful night.   Objective:   BP 109/70 (BP Location: Left Arm)   Pulse 89   Temp 98 F (36.7 C)   Resp 14   Ht 5' 11"  (1.803 m)   Wt 99.5 kg   SpO2 97%   BMI 30.59 kg/m   Intake/Output Summary (Last 24 hours) at 11/21/2019 0859 Last data filed at 11/21/2019 0400 Gross per 24 hour  Intake 720 ml  Output 825 ml  Net -105 ml   Weight change: 2 kg  Physical Exam: Gen: Comfortably resting in bed watching television CVS: Pulse regular rhythm, normal rate, S1 and S2 normal Resp: Diminished breath sounds over bases, no distinct rales or rhonchi Abd: Soft, flat, nontender Ext: Trace LE edema with trace edema right upper extremity.  Imaging: No results found.  Labs: BMET Recent Labs  Lab 11/15/19 1049 11/16/19 1400 11/19/19 1409   NA 134* 132* 134*  K 4.3 4.2 3.5  CL 97* 95* 97*  CO2 24 26 26   GLUCOSE 103* 111* 105*  BUN 39* 58* 44*  CREATININE 7.19* 9.72* 10.15*  CALCIUM 12.2* 11.9* 8.6*  PHOS 7.5* 8.3* 6.1*   CBC Recent Labs  Lab 11/16/19 1359 11/19/19 1409 11/19/19 1618  WBC 9.2 7.2 6.5  HGB 7.4* 6.8* 7.0*  HCT 23.7* 22.3* 22.5*  MCV 96.0 95.3 95.3  PLT 328 327 310    Medications:    . aspirin EC  81 mg Oral Daily  . Chlorhexidine Gluconate Cloth  6 each Topical Daily  . Chlorhexidine Gluconate Cloth  6 each Topical Q0600  . darbepoetin (ARANESP) injection - DIALYSIS  100 mcg Intravenous Q Wed-HD  . gabapentin  100 mg Oral TID  . metoprolol tartrate  12.5 mg Oral BID  . polyethylene glycol  17 g Oral BID  . senna-docusate  3 tablet Oral BID   Elmarie Shiley, MD 11/21/2019, 8:59 AM

## 2019-11-21 NOTE — Progress Notes (Signed)
Physical Therapy Session Note  Patient Details  Name: [name] MRN: [id number] Date of Birth: Jan 04, 1971  Today's Date: 11/21/2019 PT Individual Time: 1024-1102 PT Individual Time Calculation (min): 38 min   Short Term Goals: Week 2:  PT Short Term Goal 1 (Week 2): STG=LTG due to ELOS  Skilled Therapeutic Interventions/Progress Updates:   Pt received supine in bed and agreeable to PT. Supine>sit transfer without assist or cues . Stand pivot transfer to [facility] without Assist from PT with shoes on, noted mild foot drag on the RLE. WC mobility through hall x 181f without assist or cues from PT.   PT applied Foot up brace to RLE. Gait training with foot up brace without cues or assist 3x 1026fUE support on RW. PT strongly encouraged pt to purchase foot up brace to use when AFO is not available to improve safety with ambulation in home and community   Modified [place] level A balance HEP instructed by p Twith hand out provided. LAQ x10, standing HS curls x 10, hip abduction x 10, minisquats x 10, tandem stance 2 x 10 sec bil, sit<>stand x 5. UE supported on RW throughout with cues for posture and full ROM  Adaptive HS exercises in supine/sidelying instructed by PT to allow improved strengthening in gravity eliminated position.   Pt returned to room and performed stand transfer to bed with RW and no cues or assist. Sit>supine completed without assist from PT, and left supine in bed with call bell in reach and all needs met.        Therapy Documentation Precautions:  Precautions Precautions: Fall Precaution Comments: foot drop on RLE Restrictions Weight Bearing Restrictions: No Pain: Pain Assessment Pain Scale: Faces Pain Score: 7  Faces Pain Scale: No hurt Pain Type: Acute pain Pain Location: Hand Pain Orientation: Right Pain Radiating Towards: right leg Pain Descriptors / Indicators: Pins and needles Pain Frequency: Intermittent Pain Intervention(s): Medication (See  eMAR) Multiple Pain Sites: Yes   Therapy/Group: Individual Therapy  AuLorie Phenix/29/2021, 12:24 PM

## 2019-11-21 NOTE — Procedures (Signed)
Patient seen on Hemodialysis. BP 123/71 (BP Location: Right Arm)   Pulse (!) 103   Temp 97.6 F (36.4 C)   Resp 14   Ht 5' 11"  (1.803 m)   Wt 99.5 kg   SpO2 100%   BMI 30.59 kg/m   QB 400, UF goal 1L Tolerating treatment without complaints at this time.   Elmarie Shiley MD [facility]. Office # [phone] Pager # [phone] 1:39 PM

## 2019-11-21 NOTE — Progress Notes (Addendum)
PA Pam informed of pt hgb: 6.9. No new orders at this time.  Sheela Stack, LPN

## 2019-11-21 NOTE — Progress Notes (Signed)
Occupational Therapy Discharge Summary  Patient Details  Name: [name] MRN: [id number] Date of Birth: 26-Jul-1970  Today's Date: 11/21/2019 OT Individual Time: 0700-0757 OT Individual Time Calculation (min): 57 min    1:1. Pt received in bed agreeable to OT with no pain this morning. Pt completes stand pivot transfer with RW and MOD I overall EOB<>w/c. Pt gathers clothing from dresser at MOD I level with edu re if reaching towards floor to lock w/c brakes for safety. Pt verbalized understanding. Pt completes bathing and dressing at sit to stand level with MOD I for sink level bathing and dressing in the w/c. Pt able to problems solve R hemi dressing techniques without VC after attempting to thread LLE first. Pt uses reacher and sock aide with MOD I to don LB clothing. Grooming completed in seated with MOD I. Pt completes ambulatory transfer with MOD I into and out of bathroom completing toileting seated with MOD I. UE assessements completed as follows:  Dynavision  RUE 1.22 LUE 1.13 Dual task Red (RUE)- 1.33 and Green (LUE)- 1.38  9HPT RUE 25.5 LUE 24.4  Dynamometer RUE 58# LUE 78#  Pt able to maneuver and position w/c in kithen with MOD I and no cuing getting into cabinets as needed without A. Returned to room, Exited session with pt seated in bed, exit alarm on and call light in reach   Patient has met 13 of 13 long term goals due to improved activity tolerance, improved balance, postural control, ability to compensate for deficits, functional use of  RIGHT upper and RIGHT lower extremity, improved attention, improved awareness and improved coordination.  Patient to discharge at overall Modified Independent level.  Patient's care partner is independent to provide the necessary cognitive assistance at discharge.    Reasons goals not met: n/a  Recommendation:  Patient will benefit from ongoing skilled OT services in home health setting to continue to advance functional skills in  the area of BADL, iADL and Reduce care partner burden.  Equipment: No equipment provided  Reasons for discharge: treatment goals met and discharge from hospital  Patient/family agrees with progress made and goals achieved: Yes  OT Discharge Precautions/Restrictions  Precautions Precautions: Fall Precaution Comments: foot drop on RLE Restrictions Weight Bearing Restrictions: No General   Vital Signs Therapy Vitals Temp: 98 F (36.7 C) Pulse Rate: 89 Resp: 14 BP: 109/70 Patient Position (if appropriate): Lying Oxygen Therapy SpO2: 97 % O2 Device: Room Air Pain Pain Assessment Pain Scale: 0-10 Pain Score: Asleep Pain Location: Leg Pain Orientation: Right ADL ADL Grooming: Supervision/safety Where Assessed-Grooming: Sitting at sink Upper Body Bathing: Supervision/safety Where Assessed-Upper Body Bathing: Sitting at sink Lower Body Bathing: Moderate assistance Where Assessed-Lower Body Bathing: Sitting at sink, Standing at sink Upper Body Dressing: Minimal assistance Where Assessed-Upper Body Dressing: Sitting at sink Lower Body Dressing: Maximal assistance Where Assessed-Lower Body Dressing: Sitting at sink, Standing at sink Toileting: Moderate assistance Where Assessed-Toileting: Toilet, Bedside Commode Toilet Transfer: Minimal assistance Toilet Transfer Method: Stand pivot Vision Baseline Vision/History: No visual deficits Patient Visual Report: No change from baseline Vision Assessment?: No apparent visual deficits Perception  Perception: Impaired Inattention/Neglect: Does not attend to right side of body (improved since eval) Praxis Praxis: Intact Cognition Overall Cognitive Status: Impaired/Different from baseline Arousal/Alertness: Awake/alert Sustained Attention: Appears intact Memory: Appears intact Problem Solving: Appears intact Safety/Judgment: Appears intact Sensation Sensation Light Touch: Impaired Detail Light Touch Impaired Details:  Impaired RUE;Impaired RLE Additional Comments: parathesia parximally and absent light touch distally in  the RLE Coordination Gross Motor Movements are Fluid and Coordinated: No Fine Motor Movements are Fluid and Coordinated: No Motor  Motor Motor: Hemiplegia Motor - Skilled Clinical Observations: RLE weakness>UE Motor - Discharge Observations: Improved ataxia and more functional use of R hemi body Mobility  Bed Mobility Rolling Right: Independent Rolling Left: Independent Supine to Sit: Independent with assistive device Sit to Supine: Independent with assistive device Transfers Sit to Stand: Independent with assistive device Stand to Sit: Independent with assistive device  Trunk/Postural Assessment  Cervical Assessment Cervical Assessment: Exceptions to [facility] Thoracic Assessment Thoracic Assessment: Exceptions to [facility] Lumbar Assessment Lumbar Assessment: Exceptions to [facility] Postural Control Postural Control: Deficits on evaluation  Balance Balance Balance Assessed: Yes Dynamic Sitting Balance Dynamic Sitting - Level of Assistance: 7: Independent Static Standing Balance Static Standing - Level of Assistance: 6: Modified independent (Device/Increase time) Dynamic Standing Balance Dynamic Standing - Level of Assistance: 5: Stand by assistance Extremity/Trunk Assessment RUE Assessment RUE Assessment: Exceptions to [facility] Strength Comments: improved edema since eval, 3+/5 throughout RUE Body System: Neuro Brunstrum levels for arm and hand: Arm;Hand Brunstrum level for arm: Stage V Relative Independence from Synergy Brunstrum level for hand: Stage VI Isolated joint movements LUE Assessment LUE Assessment: Within Functional Limits   Tonny Branch 11/21/2019, 7:01 AM

## 2019-11-21 NOTE — Plan of Care (Signed)
  Problem: Consults Goal: RH STROKE PATIENT EDUCATION Description: See Patient Education module for education specifics  Outcome: Progressing   Problem: RH BOWEL ELIMINATION Goal: RH STG MANAGE BOWEL WITH ASSISTANCE Description: STG Manage Bowel with Mod I Assistance. Outcome: Progressing Goal: RH STG MANAGE BOWEL W/MEDICATION W/ASSISTANCE Description: STG Manage Bowel with Medication with Mod I Assistance. Outcome: Progressing   Problem: RH BLADDER ELIMINATION Goal: RH STG MANAGE BLADDER WITH ASSISTANCE Description: STG Manage Bladder With Mod I Assistance Outcome: Progressing   Problem: RH SKIN INTEGRITY Goal: RH STG MAINTAIN SKIN INTEGRITY WITH ASSISTANCE Description: STG Maintain Skin Integrity With Mod I Assistance. Outcome: Progressing Goal: RH STG ABLE TO PERFORM INCISION/WOUND CARE W/ASSISTANCE Description: STG Able To Perform Incision/Wound Care With Mod I Assistance. Outcome: Progressing   Problem: RH SAFETY Goal: RH STG ADHERE TO SAFETY PRECAUTIONS W/ASSISTANCE/DEVICE Description: STG Adhere to Safety Precautions With Mod I Assistance and appropriate assistive Device. Outcome: Progressing   Problem: RH PAIN MANAGEMENT Goal: RH STG PAIN MANAGED AT OR BELOW PT'S PAIN GOAL Description: <3 on a 0-10 pain scale. Outcome: Progressing

## 2019-11-21 NOTE — Progress Notes (Signed)
[place] PHYSICAL MEDICINE & REHABILITATION PROGRESS NOTE   Subjective/Complaints:  No issues overnight discussed follow-up hemodialysis with nephrology.  Patient anticipated to have need for hemodialysis for at least several weeks if not permanently.  Urine output is improving.  ROS: Patient denies  CP, SOB, N/V/D    Objective:   No results found. Recent Labs    11/19/19 1409 11/19/19 1618  WBC 7.2 6.5  HGB 6.8* 7.0*  HCT 22.3* 22.5*  PLT 327 310   Recent Labs    11/19/19 1409  NA 134*  K 3.5  CL 97*  CO2 26  GLUCOSE 105*  BUN 44*  CREATININE 10.15*  CALCIUM 8.6*    Intake/Output Summary (Last 24 hours) at 11/21/2019 6237 Last data filed at 11/21/2019 0900 Gross per 24 hour  Intake 720 ml  Output 825 ml  Net -105 ml        Physical Exam: Vital Signs Blood pressure 109/70, pulse 89, temperature 98 F (36.7 C), resp. rate 14, height 5' 11"  (1.803 m), weight 99.5 kg, SpO2 97 %.  General: No acute distress Mood and affect are appropriate Heart: Regular rate and rhythm no rubs murmurs or extra sounds Lungs: Clear to auscultation, breathing unlabored, no rales or wheezes Abdomen: Positive bowel sounds, soft nontender to palpation, nondistended Extremities: No clubbing, cyanosis, or edema Skin: No evidence of breakdown, no evidence of rash , motor strength is 5/5 in left deltoid, bicep, tricep, grip, hip flexor, knee extensors, ankle dorsiflexor and plantar flexor 3/5 in the right deltoid bicep tricep grip, 0-1/5 RLE Sensory exam normal sensation to light touch  bilateral upper and left lower extremities, right lower extremity no sensation below the ankle Musculoskeletal: Full range of motion in all 4 extremities. No joint swelling  Assessment/Plan: 1. Functional deficits secondary to anoxic strokes with rhabdomyolysis right upper and right lower limb causing weakness which require 3+ hours per day of interdisciplinary therapy in a comprehensive inpatient  rehab setting.  Physiatrist is providing close team supervision and 24 hour management of active medical problems listed below.  Physiatrist and rehab team continue to assess barriers to discharge/monitor patient progress toward functional and medical goals  Care Tool:  Bathing    Body parts bathed by patient: Right arm, Left arm, Chest, Abdomen, Front perineal area, Right upper leg, Left upper leg, Face, Buttocks   Body parts bathed by helper: Right lower leg, Left lower leg     Bathing assist Assist Level: Contact Guard/Touching assist     Upper Body Dressing/Undressing Upper body dressing   What is the patient wearing?: Pull over shirt    Upper body assist Assist Level: Set up assist    Lower Body Dressing/Undressing Lower body dressing      What is the patient wearing?: Underwear/pull up, Pants     Lower body assist Assist for lower body dressing: Contact Guard/Touching assist     Toileting Toileting    Toileting assist Assist for toileting: Contact Guard/Touching assist     Transfers Chair/bed transfer  Transfers assist     Chair/bed transfer assist level: Independent with assistive device     Locomotion Ambulation   Ambulation assist      Assist level: Supervision/Verbal cueing Assistive device: Walker-rolling Max distance: 171f   Walk 10 feet activity   Assist     Assist level: Supervision/Verbal cueing Assistive device: Walker-rolling   Walk 50 feet activity   Assist Walk 50 feet with 2 turns activity did not occur: Safety/medical concerns  Assist level: Supervision/Verbal cueing Assistive device: Walker-rolling    Walk 150 feet activity   Assist Walk 150 feet activity did not occur: Safety/medical concerns  Assist level: Supervision/Verbal cueing Assistive device: Walker-rolling    Walk 10 feet on uneven surface  activity   Assist Walk 10 feet on uneven surfaces activity did not occur: Safety/medical  concerns   Assist level: Contact Guard/Touching assist Assistive device: Aeronautical engineer Will patient use wheelchair at discharge?: No Type of Wheelchair: Manual    Wheelchair assist level: Supervision/Verbal cueing Max wheelchair distance: 150    Wheelchair 50 feet with 2 turns activity    Assist        Assist Level: Supervision/Verbal cueing   Wheelchair 150 feet activity     Assist      Assist Level: Supervision/Verbal cueing   Blood pressure 109/70, pulse 89, temperature 98 F (36.7 C), resp. rate 14, height 5' 11"  (1.803 m), weight 99.5 kg, SpO2 97 %.  Medical Problem List and Plan: 1.Right hemiparesis and generalized weaknesssecondary to rhabdomyolysis with neuropraxic peripheral nerve injury. Pt also with punctate occipital lobe and cerebellar infarcts as well as globi pallidi insults----potentially d/t anoxic injury -patient may shower, team conf in am  -ELOS/Goals: 9/29, HD day , now planning 9/30 Outpt EMG in ~52moPMR  2. Antithrombotics: -DVT/anticoagulation:Pharmaceutical:Heparin- amb distance 1049fwill d/c sq hep -antiplatelet therapy: 8191maily 3. Pain Management:D/c dilaudid--has been using 2.5 mg/day--will transition to oxycodone prn.  Patient is doing well on current medications. Pain is well controlled 9/21- taking oxy IR 5mg73mr day TID change to Hydrocodone- do not plan to discharge home on opioid    -  gabapentin for neuropathic pain RLE 4. Mood:LCSW to follow for evaluation and support. -antipsychotic agents: N/A 5. Neuropsych: This patientiscapable of making decisions on hisown behalf. 6. Skin/Wound Care:Routine pressure relief measures, local wound care asneeded. Retrograde massage to R hand  7. Fluids/Electrolytes/Nutrition:Strict I/O. D/C IV 8. AKI with rhabdomyolysis: CK- 2456 slowly coming down. HD dependent MWF- CLIP pending.   9. Crohn's disease: Remicade on hold.  10. Abnormal LFTs: Resolved, some of this may have been related to elevated muscle enzymes 11. Polysubstance abuse: Has completed CIWA protocol.  12. RUE/RLEweakness withedema and sensory loss,neuropraxia: -PROM/AROM, edema control -outpt EMG/NCS potentially 13. Leucocytosis: Trending downward.  14. Anemia:Supplement with IV iron X 3 and Aranesp ordered.DC sq hep- f/u renal  15.Hypocalcemia: Treated with multiple doses IV calcium gluconate--now on CaCO3 bid.  9/21: hypercalcemic today- nephro contacted.   16.  Constipation:   Improved, BM 9/28  -continue scheduled miralax and senna-s LOS: 12 days A FACE TO FACEMagnet [place]oveirsteins 11/21/2019, 9:38 AM

## 2019-11-21 NOTE — Progress Notes (Signed)
Patient ID: [name], male   DOB: Oct 09, 1970, 49 y.o.   MRN: [id number]   Team Conference Report to Patient/Family  Team Conference discussion was reviewed with the patient and caregiver, including goals, any changes in plan of care and target discharge date.  Patient and caregiver express understanding and are in agreement.  The patient has a target discharge date of 11/22/19.  Dyanne Iha 11/21/2019, 12:15 PM

## 2019-11-21 NOTE — Progress Notes (Signed)
Speech Language Pathology Daily Session Note  Patient Details  Name: KRISTA SOM MRN: [id number] Date of Birth: 11/22/1970  Today's Date: 11/21/2019 SLP Individual Time: 1130-1155 SLP Individual Time Calculation (min): 25 min  Short Term Goals: Week 1: SLP Short Term Goal 1 (Week 1): Pt will complete complex tasks with mod I functional problem solving. SLP Short Term Goal 2 (Week 1): Pt will recall new, complex information with mod I use of compensatory strategies. SLP Short Term Goal 3 (Week 1): Pt will alternate his attention between 2 mildly complex tasks with mod I.  Skilled Therapeutic Interventions: Pt was seen for skilled ST targeting cognitive goals. SLP facilitated session with 2 complex deductive reasoning puzzles; 1 in the context of scheduling and the other in context of organization. Pt required 2 verbal cues for interpretation of complex language, however he was independent for problem solving and use of strategies for recall. Several interruptions experiences during session, which was excellent opportunity to practice alternating attention between structured tasks and functional conversations; he was independent with this as well. He reports he still feels he intermittently needs extra time for processing but otherwise feels back to cognitive baseline. Pt left laying in bed with alarm set and needs within reach. Continue per current plan of care.          Pain Pain Assessment Pain Scale: Faces Faces Pain Scale: No hurt  Therapy/Group: Individual Therapy  Arbutus Leas 11/21/2019, 11:59 AM

## 2019-11-21 NOTE — Patient Care Conference (Signed)
Inpatient RehabilitationTeam Conference and Plan of Care Update Date: 11/21/2019   Time: 10:23 AM    Patient Name: [name]      Medical Record Number: [id number]  Date of Birth: [birth year] Sex: Male         Room/Bed: 4W02C/4W02C-01 Payor Info: Payor: [place] / Plan: [employer] COMM PPO / Product Type: *No Product type* /    Admit Date/Time:  11/09/2019  4:04 PM  Primary Diagnosis:  Rhabdomyolysis  Hospital Problems: Principal Problem:   Rhabdomyolysis Active Problems:   Embolic stroke ([place])   Anoxic brain injury [facility])    Expected Discharge Date: Expected Discharge Date: 11/22/19  Team Members Present: Physician leading conference: Dr. Alysia Penna Care Coodinator Present: Dorien Chihuahua, RN, BSN, CRRN;Christina Sampson Goon, Daisy Nurse Present: Benjie Karvonen, RN PT Present: Phylliss Bob, PTA OT Present: Laverle Hobby, OT PPS Coordinator present : Gunnar Fusi, SLP     Current Status/Progress Goal Weekly Team Focus  Bowel/Bladder   Patient contient of B/B; decreased urinary output, PVR per order. Last BM 9/27  To remain continent of B/B  Assess b/b habits; q2h toileting/PRN   Swallow/Nutrition/ Hydration             ADL's   mod I bathing and dressing, (S) ambulatory transfers  Supervision-Mod I  d/c planning, dynamic standing balance, functional transfers   Mobility             Communication             Safety/Cognition/ Behavioral Observations  Independent  Independent  goals met, ready for d/c tomorrow   Pain   Patient c/o pain in RLE 8=10, PRN norco effective  Patient will have pain level <3  Assess pain q shift/PRN, medicate as needed   Skin   Bruising to lower abdomen, facial abrasion healing  Patient will remain free of breakdown and/or infection  Assess skin q shift/PRN     Discharge Planning:  Patient discharging home on 9/30. Has all needed DME, OP HD Set, HH follow up arranged ([facility])   Team Discussion: MD wean off narcotic;  patient taking norco on regular basis for pain in right foot and arm. Appears ready for discharge home and HD has been set up for discharge. Independent for cognition at a complex level. Patient on target to meet rehab goals: yes, mod I in the room as of 11/21/19  *See Care Plan and progress notes for long and short-term goals.   Revisions to Treatment Plan:  Practice gait with ace wrap for dorsiflexion of right foot Teaching Needs: Medications, transfers, ambulation, etc  Current Barriers to Discharge: Decreased caregiver support and Hemodialysis Wife works Possible Resolutions to Barriers: Family education     Medical Summary Current Status: Rhabdomyolysis causing ATN, anemia  Barriers to Discharge: Hemodialysis;Medical stability   Possible Resolutions to [employer] Focus: Set up outpatient dialysis, continue erythropoietin, discharging tomorrow   Continued Need for Acute Rehabilitation Level of Care: The patient requires daily medical management by a physician with specialized training in physical medicine and rehabilitation for the following reasons: Direction of a multidisciplinary physical rehabilitation program to maximize functional independence : Yes Medical management of patient stability for increased activity during participation in an intensive rehabilitation regime.: Yes Analysis of laboratory values and/or radiology reports with any subsequent need for medication adjustment and/or medical intervention. : Yes   I attest that I was present, lead the team conference, and concur with the assessment and plan of the team.  Dorien Chihuahua B 11/21/2019, 12:58 PM

## 2019-11-21 NOTE — Progress Notes (Signed)
Pt off unit to dialysis per bed. No other needs at this time.  Sheela Stack, LPN

## 2019-11-22 DIAGNOSIS — D638 Anemia in other chronic diseases classified elsewhere: Secondary | ICD-10-CM

## 2019-11-22 DIAGNOSIS — G629 Polyneuropathy, unspecified: Secondary | ICD-10-CM

## 2019-11-22 LAB — TYPE AND SCREEN
ABO/RH(D): O POS
Antibody Screen: NEGATIVE
Unit division: 0

## 2019-11-22 LAB — BPAM RBC
Blood Product Expiration Date: 202110302359
ISSUE DATE / TIME: 202109291624
Unit Type and Rh: 5100

## 2019-11-22 MED ORDER — METOPROLOL TARTRATE 25 MG PO TABS: 12.5000 mg | ORAL_TABLET | Freq: Two times a day (BID) | ORAL | 0 refills | Status: DC

## 2019-11-22 MED ORDER — POLYETHYLENE GLYCOL 3350 17 G PO PACK: 17.0000 g | PACK | Freq: Two times a day (BID) | ORAL | 0 refills | Status: DC

## 2019-11-22 MED ORDER — GABAPENTIN 100 MG PO CAPS: 100.0000 mg | ORAL_CAPSULE | Freq: Three times a day (TID) | ORAL | 0 refills | Status: DC

## 2019-11-22 MED ORDER — SENNOSIDES-DOCUSATE SODIUM 8.6-50 MG PO TABS: 3.0000 | ORAL_TABLET | Freq: Two times a day (BID) | ORAL | 0 refills | Status: DC

## 2019-11-22 MED ORDER — TRAMADOL HCL 50 MG PO TABS
50.0000 mg | ORAL_TABLET | Freq: Four times a day (QID) | ORAL | 0 refills | Status: DC | PRN
Start: 2019-11-22 — End: 2019-12-11

## 2019-11-22 NOTE — Progress Notes (Signed)
[place]-PDMP reviewed and no record of narcotics  filled for the past year.

## 2019-11-22 NOTE — Progress Notes (Signed)
Inpatient Rehabilitation Care Coordinator  Discharge Note  The overall goal for the admission was met for:   Discharge location: Yes. D/c to home.   Length of Stay: Yes. 12 days.   Discharge activity level: Yes. Mod I  Home/community participation: Yes. Limited.   Services provided included: MD, RD, PT, OT, RN, CM, TR, Pharmacy, Neuropsych and SW  Financial Services: Private Insurance: [place]  Follow-up services arranged: Home Health: [facility] for HHPT/OT/SLP  Comments (or additional information): contact [phone]  Patient/Family verbalized understanding of follow-up arrangements: Yes  Individual responsible for coordination of the follow-up plan: Pt to have assistance with coordinating care needs.   Confirmed correct DME delivered: Rana Snare 11/22/2019    Rana Snare

## 2019-11-22 NOTE — Progress Notes (Signed)
PHYSICAL MEDICINE & REHABILITATION PROGRESS NOTE   Subjective/Complaints:  Wife here , discussed restriction , no driving, no meal prep Pt mod I in room  No dizziness  ROS: Patient denies  CP, SOB, N/V/D    Objective:   No results found. Recent Labs    11/19/19 1618 11/21/19 1226  WBC 6.5 6.4  HGB 7.0* 6.9*  HCT 22.5* 22.5*  PLT 310 312   Recent Labs    11/19/19 1409 11/21/19 1226  NA 134* 135  K 3.5 3.6  CL 97* 96*  CO2 26 26  GLUCOSE 105* 98  BUN 44* 29*  CREATININE 10.15* 8.44*  CALCIUM 8.6* 7.7*    Intake/Output Summary (Last 24 hours) at 11/22/2019 0848 Last data filed at 11/21/2019 1847 Gross per 24 hour  Intake 1040 ml  Output 1585 ml  Net -545 ml        Physical Exam: Vital Signs Blood pressure 115/66, pulse 92, temperature 98.3 F (36.8 C), temperature source Oral, resp. rate 18, height 5' 11"  (1.803 m), weight 97 kg, SpO2 96 %.  General: No acute distress Mood and affect are appropriate Heart: Regular rate and rhythm no rubs murmurs or extra sounds Lungs: Clear to auscultation, breathing unlabored, no rales or wheezes Abdomen: Positive bowel sounds, soft nontender to palpation, nondistended Extremities: No clubbing, cyanosis, or edema Skin: No evidence of breakdown, no evidence of rash , motor strength is 5/5 in left deltoid, bicep, tricep, grip, hip flexor, knee extensors, ankle dorsiflexor and plantar flexor 3/5 in the right deltoid bicep tricep grip, 4- prox and 0 distal RLE Sensory exam normal sensation to light touch  bilateral upper and left lower extremities, right lower extremity no sensation below the ankle Musculoskeletal: Full range of motion in all 4 extremities. No joint swelling  Assessment/Plan: 1. Functional deficits secondary to anoxic strokes with rhabdomyolysis right upper and right lower limb  Stable for D/C today F/u PCP in 3-4 weeks F/u PM&R 2 weeks See D/C summary See D/C instructions  Care  Tool:  Bathing    Body parts bathed by patient: Right arm, Left arm, Chest, Abdomen, Front perineal area, Right upper leg, Left upper leg, Face, Buttocks, Right lower leg, Left lower leg   Body parts bathed by helper: Right lower leg, Left lower leg     Bathing assist Assist Level: Independent     Upper Body Dressing/Undressing Upper body dressing   What is the patient wearing?: Pull over shirt    Upper body assist Assist Level: Independent    Lower Body Dressing/Undressing Lower body dressing      What is the patient wearing?: Underwear/pull up, Pants     Lower body assist Assist for lower body dressing: Independent     Toileting Toileting    Toileting assist Assist for toileting: Independent with assistive device     Transfers Chair/bed transfer  Transfers assist     Chair/bed transfer assist level: Independent with assistive device     Locomotion Ambulation   Ambulation assist      Assist level: Supervision/Verbal cueing Assistive device: Walker-rolling Max distance: 1107f   Walk 10 feet activity   Assist     Assist level: Supervision/Verbal cueing Assistive device: Walker-rolling   Walk 50 feet activity   Assist Walk 50 feet with 2 turns activity did not occur: Safety/medical concerns  Assist level: Supervision/Verbal cueing Assistive device: Walker-rolling    Walk 150 feet activity   Assist Walk 150 feet activity did not  occur: Safety/medical concerns  Assist level: Supervision/Verbal cueing Assistive device: Walker-rolling    Walk 10 feet on uneven surface  activity   Assist Walk 10 feet on uneven surfaces activity did not occur: Safety/medical concerns   Assist level: Contact Guard/Touching assist Assistive device: Aeronautical engineer Will patient use wheelchair at discharge?: No Type of Wheelchair: Manual    Wheelchair assist level: Supervision/Verbal cueing Max wheelchair distance: 150     Wheelchair 50 feet with 2 turns activity    Assist        Assist Level: Supervision/Verbal cueing   Wheelchair 150 feet activity     Assist      Assist Level: Supervision/Verbal cueing   Blood pressure 115/66, pulse 92, temperature 98.3 F (36.8 C), temperature source Oral, resp. rate 18, height 5' 11"  (1.803 m), weight 97 kg, SpO2 96 %.  Medical Problem List and Plan: 1.Right hemiparesis and generalized weaknesssecondary to rhabdomyolysis with neuropraxic peripheral nerve injury. Pt also with punctate occipital lobe and cerebellar infarcts as well as globi pallidi insults----potentially d/t anoxic injury - -ELOS/Goals: 9/29, HD day , now planning 9/30 Outpt EMG in ~35moPMR  2. Antithrombotics: -DVT/anticoagulation:Pharmaceutical:Heparin- amb distance 1041fwill d/c sq hep -antiplatelet therapy: 8196maily 3. Pain Management:D/c dilaudid--has been using 2.5 mg/day--will transition to oxycodone prn.  Patient is doing well on current medications. Pain is well controlled 9/21- taking oxy IR 5mg109mr day TID change to Hydrocodone- do not plan to discharge home on opioid    -  gabapentin for neuropathic pain RLE 4. Mood:LCSW to follow for evaluation and support. -antipsychotic agents: N/A 5. Neuropsych: This patientiscapable of making decisions on hisown behalf. 6. Skin/Wound Care:Routine pressure relief measures, local wound care asneeded. Retrograde massage to R hand  7. Fluids/Electrolytes/Nutrition:Strict I/O. D/C IV 8. AKI with rhabdomyolysis: CK- 2456 slowly coming down. HD dependent MWF- CLIP pending.  9. Crohn's disease: Remicade on hold.  10. Abnormal LFTs: Resolved, some of this may have been related to elevated muscle enzymes 11. Polysubstance abuse: Has completed CIWA protocol.  12. RUE/RLEweakness withedema and sensory loss,neuropraxia: -PROM/AROM, edema  control -outpt EMG/NCS potentially 13. Leucocytosis: Trending downward.  14. Anemia:Supplement with IV iron X 3 and Aranesp ordered.DC sq hep- f/u renal  15.Hypocalcemia: Treated with multiple doses IV calcium gluconate--now on CaCO3 bid.  9/21: hypercalcemic today- nephro contacted.   16.  Constipation:   Improved, BM 9/30  -continue scheduled miralax and senna-s LOS: 13 days A FACE TO FACEHawardenirsteins 11/22/2019, 8:48 AM

## 2019-11-22 NOTE — Discharge Instructions (Signed)
Inpatient Rehab Discharge Instructions  [name] Discharge date and time: 11/22/19   Activities/Precautions/ Functional Status: Activity: no lifting, driving, or strenuous exercise till cleared by MD Diet: renal diet--limit fluids ot 1200 cc/day Wound Care: keep wound clean and dry    Functional status:  ___ No restrictions     ___ Walk up steps independently _X__ 24/7 supervision/assistance   ___ Walk up steps with assistance ___ Intermittent supervision/assistance  ___ Bathe/dress independently ___ Walk with walker     ___ Bathe/dress with assistance ___ Walk Independently    _X__ Shower independently _X__ Walk with assistance    ___ Shower with assistance _X__ No alcohol/drugs    ___ Return to work/school ________   Special Instructions:  COMMUNITY REFERRALS UPON DISCHARGE:    Home Health:   PT     OT                    Agency: [facility] Phone: [phone]       My questions have been answered and I understand these instructions. I will adhere to these goals and the provided educational materials after my discharge from the hospital.  Patient/Caregiver Signature _______________________________ Date __________  Clinician Signature _______________________________________ Date __________  Please bring this form and your medication list with you to all your follow-up doctor's appointments.

## 2019-11-22 NOTE — Progress Notes (Signed)
Pt left unit per wheelchair with staff. Discharge instructions given per PA. No further questions. All belongings returned to pt.  Sheela Stack, LPN
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
## 2019-11-23 ENCOUNTER — Telehealth: Payer: Self-pay | Admitting: Physician Assistant

## 2019-11-23 DIAGNOSIS — Z992 Dependence on renal dialysis: Secondary | ICD-10-CM | POA: Diagnosis not present

## 2019-11-23 DIAGNOSIS — N179 Acute kidney failure, unspecified: Secondary | ICD-10-CM | POA: Diagnosis not present

## 2019-11-23 NOTE — Telephone Encounter (Signed)
Transition of care contact from inpatient facility  Date of Discharge: 11/22/19 Date of Contact: 11/23/19 Method of contact: Phone  Attempted to contact patient to discuss transition of care from inpatient admission. Patient did not answer the phone. Message was left on the patient's voicemail with call back instructions.  Anice Paganini, PA-C 11/23/2019, 2:47 PM  [employer]

## 2019-11-24 ENCOUNTER — Telehealth (HOSPITAL_COMMUNITY): Payer: Self-pay | Admitting: Nephrology

## 2019-11-24 NOTE — Telephone Encounter (Signed)
Transition of care contact from inpatient facility  Date of discharge: 11/22/19 Date of contact: 11/24/19 Method: Phone Spoke to: Patient  Patient contacted to discuss transition of care from recent inpatient hospitalization. Patient was admitted to [facility] from 11/09/2019 - 9/30/2021with discharge diagnosis of acute rhabdomyolysis, acute kidney injury requiring dialysis.  Medication changes were reviewed. He has no questions.  Patient will follow up with his/her new outpatient HD unit on: Monday -- has directions, understands what time to be there to sign paperwork.  He has no concerns at this time and feels well.  Veneta Penton, PA-C [employer] Pager [phone]

## 2019-11-26 ENCOUNTER — Telehealth: Payer: Self-pay | Admitting: Registered Nurse

## 2019-11-26 DIAGNOSIS — N179 Acute kidney failure, unspecified: Secondary | ICD-10-CM | POA: Diagnosis not present

## 2019-11-26 DIAGNOSIS — Z992 Dependence on renal dialysis: Secondary | ICD-10-CM | POA: Diagnosis not present

## 2019-11-26 NOTE — Telephone Encounter (Signed)
TC call placed, no answer. Left message with appointment time and was asked to return the  call .

## 2019-11-27 NOTE — Telephone Encounter (Signed)
TC Call Not Returned.  Transition Care Management Unsuccessful Follow-up Telephone Call  Date of discharge and from where: 11/22/2019, from Inpatient Rehabilitation  Attempts:  X1  Reason for unsuccessful TCM follow-up call:  No Return Call

## 2019-11-28 DIAGNOSIS — Z992 Dependence on renal dialysis: Secondary | ICD-10-CM | POA: Diagnosis not present

## 2019-11-28 DIAGNOSIS — N179 Acute kidney failure, unspecified: Secondary | ICD-10-CM | POA: Diagnosis not present

## 2019-11-30 DIAGNOSIS — M6282 Rhabdomyolysis: Secondary | ICD-10-CM | POA: Diagnosis not present

## 2019-11-30 DIAGNOSIS — N179 Acute kidney failure, unspecified: Secondary | ICD-10-CM | POA: Diagnosis not present

## 2019-11-30 DIAGNOSIS — N186 End stage renal disease: Secondary | ICD-10-CM | POA: Diagnosis not present

## 2019-11-30 DIAGNOSIS — Z992 Dependence on renal dialysis: Secondary | ICD-10-CM | POA: Diagnosis not present

## 2019-11-30 NOTE — Progress Notes (Signed)
Patient ID: [name], male   DOB: Sep 29, 1970, 49 y.o.   MRN: [id number]  [facility] unable to service patient for [facility] due to insurance

## 2019-11-30 NOTE — Progress Notes (Signed)
Patient ID: [name], male   DOB: 1970-09-04, 49 y.o.   MRN: [id number]  SW updated [facility] unable to service patient due to insurance as of 11/29/2019. Sw will continue to attempt [facility]. As of 11/30/2019 [facility] has declined patient again. Will continue to send out referral.  Erlene Quan, [place]

## 2019-12-03 DIAGNOSIS — N179 Acute kidney failure, unspecified: Secondary | ICD-10-CM | POA: Diagnosis not present

## 2019-12-03 DIAGNOSIS — Z992 Dependence on renal dialysis: Secondary | ICD-10-CM | POA: Diagnosis not present

## 2019-12-04 DIAGNOSIS — N179 Acute kidney failure, unspecified: Secondary | ICD-10-CM | POA: Diagnosis not present

## 2019-12-04 DIAGNOSIS — M79604 Pain in right leg: Secondary | ICD-10-CM | POA: Diagnosis not present

## 2019-12-04 DIAGNOSIS — R29898 Other symptoms and signs involving the musculoskeletal system: Secondary | ICD-10-CM | POA: Diagnosis not present

## 2019-12-04 DIAGNOSIS — K5010 Crohn's disease of large intestine without complications: Secondary | ICD-10-CM | POA: Diagnosis not present

## 2019-12-05 DIAGNOSIS — Z992 Dependence on renal dialysis: Secondary | ICD-10-CM | POA: Diagnosis not present

## 2019-12-05 DIAGNOSIS — N179 Acute kidney failure, unspecified: Secondary | ICD-10-CM | POA: Diagnosis not present

## 2019-12-06 DIAGNOSIS — Z992 Dependence on renal dialysis: Secondary | ICD-10-CM | POA: Diagnosis not present

## 2019-12-06 DIAGNOSIS — T8249XA Other complication of vascular dialysis catheter, initial encounter: Secondary | ICD-10-CM | POA: Diagnosis not present

## 2019-12-06 DIAGNOSIS — N186 End stage renal disease: Secondary | ICD-10-CM | POA: Diagnosis not present

## 2019-12-07 ENCOUNTER — Inpatient Hospital Stay: Payer: BC Managed Care – PPO | Admitting: Physical Medicine & Rehabilitation

## 2019-12-07 DIAGNOSIS — N179 Acute kidney failure, unspecified: Secondary | ICD-10-CM | POA: Diagnosis not present

## 2019-12-07 DIAGNOSIS — Z992 Dependence on renal dialysis: Secondary | ICD-10-CM | POA: Diagnosis not present

## 2019-12-08 DIAGNOSIS — I63541 Cerebral infarction due to unspecified occlusion or stenosis of right cerebellar artery: Secondary | ICD-10-CM | POA: Diagnosis not present

## 2019-12-08 DIAGNOSIS — R2689 Other abnormalities of gait and mobility: Secondary | ICD-10-CM | POA: Diagnosis not present

## 2019-12-08 DIAGNOSIS — M6281 Muscle weakness (generalized): Secondary | ICD-10-CM | POA: Diagnosis not present

## 2019-12-08 DIAGNOSIS — M6282 Rhabdomyolysis: Secondary | ICD-10-CM | POA: Diagnosis not present

## 2019-12-08 DIAGNOSIS — M4807 Spinal stenosis, lumbosacral region: Secondary | ICD-10-CM | POA: Diagnosis not present

## 2019-12-08 DIAGNOSIS — N179 Acute kidney failure, unspecified: Secondary | ICD-10-CM | POA: Diagnosis not present

## 2019-12-10 DIAGNOSIS — N2581 Secondary hyperparathyroidism of renal origin: Secondary | ICD-10-CM | POA: Diagnosis not present

## 2019-12-10 DIAGNOSIS — Z992 Dependence on renal dialysis: Secondary | ICD-10-CM | POA: Diagnosis not present

## 2019-12-10 DIAGNOSIS — N179 Acute kidney failure, unspecified: Secondary | ICD-10-CM | POA: Diagnosis not present

## 2019-12-11 ENCOUNTER — Encounter: Payer: Self-pay | Admitting: Physical Medicine & Rehabilitation

## 2019-12-11 ENCOUNTER — Other Ambulatory Visit: Payer: Self-pay

## 2019-12-11 ENCOUNTER — Encounter
Payer: BC Managed Care – PPO | Attending: Physical Medicine & Rehabilitation | Admitting: Physical Medicine & Rehabilitation

## 2019-12-11 VITALS — BP 117/86 | HR 108 | Temp 97.7°F | Ht 71.0 in | Wt 178.2 lb

## 2019-12-11 DIAGNOSIS — G931 Anoxic brain damage, not elsewhere classified: Secondary | ICD-10-CM | POA: Insufficient documentation

## 2019-12-11 DIAGNOSIS — M6281 Muscle weakness (generalized): Secondary | ICD-10-CM | POA: Diagnosis not present

## 2019-12-11 DIAGNOSIS — M21379 Foot drop, unspecified foot: Secondary | ICD-10-CM | POA: Diagnosis not present

## 2019-12-11 DIAGNOSIS — R2689 Other abnormalities of gait and mobility: Secondary | ICD-10-CM | POA: Diagnosis not present

## 2019-12-11 DIAGNOSIS — M4807 Spinal stenosis, lumbosacral region: Secondary | ICD-10-CM | POA: Diagnosis not present

## 2019-12-11 DIAGNOSIS — N179 Acute kidney failure, unspecified: Secondary | ICD-10-CM | POA: Diagnosis not present

## 2019-12-11 DIAGNOSIS — M6282 Rhabdomyolysis: Secondary | ICD-10-CM | POA: Diagnosis not present

## 2019-12-11 DIAGNOSIS — I63541 Cerebral infarction due to unspecified occlusion or stenosis of right cerebellar artery: Secondary | ICD-10-CM | POA: Diagnosis not present
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
## 2019-12-11 NOTE — Patient Instructions (Signed)
Your physician has ordered a Nerve Conduction Study (NCV) and/or EMG testing.  This is a test to assess the status of your nerves and muscles. For the NCV portion of the test , sticky tabs will be placed on either your hands or feet.  Your nerves will be stimulated using small electrical charges and the speed of the impulse will be measured as it travels down the nerve. For the EMG portion of the test, a small pin will be placed below the surface of the skin to measure the electrical activity in certain muscles in your arms or legs. Eating/drinking prior to testing is ok.  Please DO NOT discontinue ANY medications prior to testing  Your appointment will be scheduled by a member of our team.  You will need to check in with the main reception desk. Your test could take approximately 30 minutes to 1 hour to complete depending on the extent of the test that has been ordered. TESTING ON LEGS/BACK/HIP/FOOT AREA: Bring/wear a pair of shorts.  **ABSOLUTELY NO LOTIONS, MOISTURIZERS, VASELINE, OILS OR CREAMS OF ANY KIND ON ANY BODY PART THE DAY OF THE TEST**  **DEODORANT IS OK TO WEAR**  Please make every effort to keep your scheduled appointment time, as your physician needs the test results prior to your next appointment.  If you have to cancel or reschedule, please do so as soon as possible, so that another patient may use that testing time slot.  If you cancel your appointment, you may also need to reschedule your referring doctor's appointment until the test and results can be completed.  Please call [phone] and ask for Dr. Romona Curls assistant if you need to do so.  If you have any questions at any time please let us know.  We look forward to working with you!!

## 2019-12-12 DIAGNOSIS — N2581 Secondary hyperparathyroidism of renal origin: Secondary | ICD-10-CM | POA: Diagnosis not present

## 2019-12-12 DIAGNOSIS — N179 Acute kidney failure, unspecified: Secondary | ICD-10-CM | POA: Diagnosis not present

## 2019-12-12 DIAGNOSIS — Z992 Dependence on renal dialysis: Secondary | ICD-10-CM | POA: Diagnosis not present

## 2019-12-13 DIAGNOSIS — N179 Acute kidney failure, unspecified: Secondary | ICD-10-CM | POA: Diagnosis not present

## 2019-12-13 DIAGNOSIS — I63541 Cerebral infarction due to unspecified occlusion or stenosis of right cerebellar artery: Secondary | ICD-10-CM | POA: Diagnosis not present

## 2019-12-13 DIAGNOSIS — R2689 Other abnormalities of gait and mobility: Secondary | ICD-10-CM | POA: Diagnosis not present

## 2019-12-13 DIAGNOSIS — M6282 Rhabdomyolysis: Secondary | ICD-10-CM | POA: Diagnosis not present

## 2019-12-13 DIAGNOSIS — M6281 Muscle weakness (generalized): Secondary | ICD-10-CM | POA: Diagnosis not present

## 2019-12-13 DIAGNOSIS — M4807 Spinal stenosis, lumbosacral region: Secondary | ICD-10-CM | POA: Diagnosis not present

## 2019-12-14 ENCOUNTER — Other Ambulatory Visit: Payer: Self-pay | Admitting: Physical Medicine and Rehabilitation

## 2019-12-14 DIAGNOSIS — N179 Acute kidney failure, unspecified: Secondary | ICD-10-CM | POA: Diagnosis not present

## 2019-12-14 DIAGNOSIS — Z992 Dependence on renal dialysis: Secondary | ICD-10-CM | POA: Diagnosis not present

## 2019-12-14 DIAGNOSIS — N2581 Secondary hyperparathyroidism of renal origin: Secondary | ICD-10-CM | POA: Diagnosis not present

## 2019-12-17 DIAGNOSIS — Z992 Dependence on renal dialysis: Secondary | ICD-10-CM | POA: Diagnosis not present

## 2019-12-17 DIAGNOSIS — N179 Acute kidney failure, unspecified: Secondary | ICD-10-CM | POA: Diagnosis not present

## 2019-12-17 DIAGNOSIS — N2581 Secondary hyperparathyroidism of renal origin: Secondary | ICD-10-CM | POA: Diagnosis not present

## 2019-12-18 DIAGNOSIS — N179 Acute kidney failure, unspecified: Secondary | ICD-10-CM | POA: Diagnosis not present

## 2019-12-18 DIAGNOSIS — M4807 Spinal stenosis, lumbosacral region: Secondary | ICD-10-CM | POA: Diagnosis not present

## 2019-12-18 DIAGNOSIS — I63541 Cerebral infarction due to unspecified occlusion or stenosis of right cerebellar artery: Secondary | ICD-10-CM | POA: Diagnosis not present

## 2019-12-18 DIAGNOSIS — R2689 Other abnormalities of gait and mobility: Secondary | ICD-10-CM | POA: Diagnosis not present

## 2019-12-18 DIAGNOSIS — M6282 Rhabdomyolysis: Secondary | ICD-10-CM | POA: Diagnosis not present

## 2019-12-18 DIAGNOSIS — M6281 Muscle weakness (generalized): Secondary | ICD-10-CM | POA: Diagnosis not present

## 2019-12-19 DIAGNOSIS — N179 Acute kidney failure, unspecified: Secondary | ICD-10-CM | POA: Diagnosis not present

## 2019-12-19 DIAGNOSIS — Z992 Dependence on renal dialysis: Secondary | ICD-10-CM | POA: Diagnosis not present

## 2019-12-19 DIAGNOSIS — N2581 Secondary hyperparathyroidism of renal origin: Secondary | ICD-10-CM | POA: Diagnosis not present

## 2019-12-20 DIAGNOSIS — M4807 Spinal stenosis, lumbosacral region: Secondary | ICD-10-CM | POA: Diagnosis not present

## 2019-12-20 DIAGNOSIS — M6281 Muscle weakness (generalized): Secondary | ICD-10-CM | POA: Diagnosis not present

## 2019-12-20 DIAGNOSIS — I63541 Cerebral infarction due to unspecified occlusion or stenosis of right cerebellar artery: Secondary | ICD-10-CM | POA: Diagnosis not present

## 2019-12-20 DIAGNOSIS — M6282 Rhabdomyolysis: Secondary | ICD-10-CM | POA: Diagnosis not present

## 2019-12-20 DIAGNOSIS — N179 Acute kidney failure, unspecified: Secondary | ICD-10-CM | POA: Diagnosis not present

## 2019-12-20 DIAGNOSIS — R2689 Other abnormalities of gait and mobility: Secondary | ICD-10-CM | POA: Diagnosis not present

## 2019-12-21 DIAGNOSIS — N2581 Secondary hyperparathyroidism of renal origin: Secondary | ICD-10-CM | POA: Diagnosis not present

## 2019-12-21 DIAGNOSIS — N179 Acute kidney failure, unspecified: Secondary | ICD-10-CM | POA: Diagnosis not present

## 2019-12-21 DIAGNOSIS — Z992 Dependence on renal dialysis: Secondary | ICD-10-CM | POA: Diagnosis not present

## 2019-12-24 DIAGNOSIS — Z992 Dependence on renal dialysis: Secondary | ICD-10-CM | POA: Diagnosis not present

## 2019-12-24 DIAGNOSIS — N2581 Secondary hyperparathyroidism of renal origin: Secondary | ICD-10-CM | POA: Diagnosis not present

## 2019-12-24 DIAGNOSIS — N179 Acute kidney failure, unspecified: Secondary | ICD-10-CM | POA: Diagnosis not present

## 2019-12-25 DIAGNOSIS — M6281 Muscle weakness (generalized): Secondary | ICD-10-CM | POA: Diagnosis not present

## 2019-12-25 DIAGNOSIS — R2689 Other abnormalities of gait and mobility: Secondary | ICD-10-CM | POA: Diagnosis not present

## 2019-12-25 DIAGNOSIS — M6282 Rhabdomyolysis: Secondary | ICD-10-CM | POA: Diagnosis not present

## 2019-12-25 DIAGNOSIS — M4807 Spinal stenosis, lumbosacral region: Secondary | ICD-10-CM | POA: Diagnosis not present

## 2019-12-25 DIAGNOSIS — I63541 Cerebral infarction due to unspecified occlusion or stenosis of right cerebellar artery: Secondary | ICD-10-CM | POA: Diagnosis not present

## 2019-12-25 DIAGNOSIS — N179 Acute kidney failure, unspecified: Secondary | ICD-10-CM | POA: Diagnosis not present

## 2019-12-26 DIAGNOSIS — N179 Acute kidney failure, unspecified: Secondary | ICD-10-CM | POA: Diagnosis not present

## 2019-12-26 DIAGNOSIS — Z992 Dependence on renal dialysis: Secondary | ICD-10-CM | POA: Diagnosis not present

## 2019-12-26 DIAGNOSIS — N2581 Secondary hyperparathyroidism of renal origin: Secondary | ICD-10-CM | POA: Diagnosis not present

## 2019-12-27 DIAGNOSIS — R2689 Other abnormalities of gait and mobility: Secondary | ICD-10-CM | POA: Diagnosis not present

## 2019-12-27 DIAGNOSIS — M6282 Rhabdomyolysis: Secondary | ICD-10-CM | POA: Diagnosis not present

## 2019-12-27 DIAGNOSIS — I63541 Cerebral infarction due to unspecified occlusion or stenosis of right cerebellar artery: Secondary | ICD-10-CM | POA: Diagnosis not present

## 2019-12-27 DIAGNOSIS — M6281 Muscle weakness (generalized): Secondary | ICD-10-CM | POA: Diagnosis not present

## 2019-12-27 DIAGNOSIS — M4807 Spinal stenosis, lumbosacral region: Secondary | ICD-10-CM | POA: Diagnosis not present

## 2019-12-27 DIAGNOSIS — N179 Acute kidney failure, unspecified: Secondary | ICD-10-CM | POA: Diagnosis not present

## 2019-12-28 DIAGNOSIS — Z992 Dependence on renal dialysis: Secondary | ICD-10-CM | POA: Diagnosis not present

## 2019-12-28 DIAGNOSIS — N2581 Secondary hyperparathyroidism of renal origin: Secondary | ICD-10-CM | POA: Diagnosis not present

## 2019-12-28 DIAGNOSIS — N179 Acute kidney failure, unspecified: Secondary | ICD-10-CM | POA: Diagnosis not present

## 2019-12-31 DIAGNOSIS — N2581 Secondary hyperparathyroidism of renal origin: Secondary | ICD-10-CM | POA: Diagnosis not present

## 2019-12-31 DIAGNOSIS — N179 Acute kidney failure, unspecified: Secondary | ICD-10-CM | POA: Diagnosis not present

## 2019-12-31 DIAGNOSIS — Z992 Dependence on renal dialysis: Secondary | ICD-10-CM | POA: Diagnosis not present

## 2020-01-01 ENCOUNTER — Other Ambulatory Visit: Payer: Self-pay | Admitting: Physical Medicine and Rehabilitation

## 2020-01-01 DIAGNOSIS — M4807 Spinal stenosis, lumbosacral region: Secondary | ICD-10-CM | POA: Diagnosis not present

## 2020-01-01 DIAGNOSIS — M6282 Rhabdomyolysis: Secondary | ICD-10-CM | POA: Diagnosis not present

## 2020-01-01 DIAGNOSIS — M21371 Foot drop, right foot: Secondary | ICD-10-CM | POA: Diagnosis not present

## 2020-01-01 DIAGNOSIS — M6281 Muscle weakness (generalized): Secondary | ICD-10-CM | POA: Diagnosis not present

## 2020-01-01 DIAGNOSIS — R2689 Other abnormalities of gait and mobility: Secondary | ICD-10-CM | POA: Diagnosis not present

## 2020-01-01 DIAGNOSIS — N179 Acute kidney failure, unspecified: Secondary | ICD-10-CM | POA: Diagnosis not present

## 2020-01-01 DIAGNOSIS — I63541 Cerebral infarction due to unspecified occlusion or stenosis of right cerebellar artery: Secondary | ICD-10-CM | POA: Diagnosis not present

## 2020-01-01 NOTE — Telephone Encounter (Signed)
Refill request Gabapentin. Is it okay to refill, not mentioned to continue in last note.

## 2020-01-02 DIAGNOSIS — N2581 Secondary hyperparathyroidism of renal origin: Secondary | ICD-10-CM | POA: Diagnosis not present

## 2020-01-02 DIAGNOSIS — N179 Acute kidney failure, unspecified: Secondary | ICD-10-CM | POA: Diagnosis not present

## 2020-01-02 DIAGNOSIS — Z992 Dependence on renal dialysis: Secondary | ICD-10-CM | POA: Diagnosis not present

## 2020-01-03 DIAGNOSIS — R2689 Other abnormalities of gait and mobility: Secondary | ICD-10-CM | POA: Diagnosis not present

## 2020-01-03 DIAGNOSIS — M6281 Muscle weakness (generalized): Secondary | ICD-10-CM | POA: Diagnosis not present

## 2020-01-03 DIAGNOSIS — M4807 Spinal stenosis, lumbosacral region: Secondary | ICD-10-CM | POA: Diagnosis not present

## 2020-01-03 DIAGNOSIS — N179 Acute kidney failure, unspecified: Secondary | ICD-10-CM | POA: Diagnosis not present

## 2020-01-03 DIAGNOSIS — M6282 Rhabdomyolysis: Secondary | ICD-10-CM | POA: Diagnosis not present

## 2020-01-03 DIAGNOSIS — I63541 Cerebral infarction due to unspecified occlusion or stenosis of right cerebellar artery: Secondary | ICD-10-CM | POA: Diagnosis not present

## 2020-01-07 DIAGNOSIS — I129 Hypertensive chronic kidney disease with stage 1 through stage 4 chronic kidney disease, or unspecified chronic kidney disease: Secondary | ICD-10-CM | POA: Diagnosis not present

## 2020-01-07 DIAGNOSIS — N185 Chronic kidney disease, stage 5: Secondary | ICD-10-CM | POA: Diagnosis not present

## 2020-01-07 DIAGNOSIS — N179 Acute kidney failure, unspecified: Secondary | ICD-10-CM | POA: Diagnosis not present

## 2020-01-07 DIAGNOSIS — D631 Anemia in chronic kidney disease: Secondary | ICD-10-CM | POA: Diagnosis not present

## 2020-01-07 DIAGNOSIS — R000 Tachycardia, unspecified: Secondary | ICD-10-CM | POA: Diagnosis not present

## 2020-01-07 DIAGNOSIS — N189 Chronic kidney disease, unspecified: Secondary | ICD-10-CM | POA: Diagnosis not present

## 2020-01-08 DIAGNOSIS — M6281 Muscle weakness (generalized): Secondary | ICD-10-CM | POA: Diagnosis not present

## 2020-01-08 DIAGNOSIS — M4807 Spinal stenosis, lumbosacral region: Secondary | ICD-10-CM | POA: Diagnosis not present

## 2020-01-08 DIAGNOSIS — M6282 Rhabdomyolysis: Secondary | ICD-10-CM | POA: Diagnosis not present

## 2020-01-08 DIAGNOSIS — I63541 Cerebral infarction due to unspecified occlusion or stenosis of right cerebellar artery: Secondary | ICD-10-CM | POA: Diagnosis not present

## 2020-01-08 DIAGNOSIS — R2689 Other abnormalities of gait and mobility: Secondary | ICD-10-CM | POA: Diagnosis not present

## 2020-01-08 DIAGNOSIS — N179 Acute kidney failure, unspecified: Secondary | ICD-10-CM | POA: Diagnosis not present

## 2020-01-10 DIAGNOSIS — N179 Acute kidney failure, unspecified: Secondary | ICD-10-CM | POA: Diagnosis not present

## 2020-01-10 DIAGNOSIS — M6281 Muscle weakness (generalized): Secondary | ICD-10-CM | POA: Diagnosis not present

## 2020-01-10 DIAGNOSIS — M4807 Spinal stenosis, lumbosacral region: Secondary | ICD-10-CM | POA: Diagnosis not present

## 2020-01-10 DIAGNOSIS — M6282 Rhabdomyolysis: Secondary | ICD-10-CM | POA: Diagnosis not present

## 2020-01-10 DIAGNOSIS — I63541 Cerebral infarction due to unspecified occlusion or stenosis of right cerebellar artery: Secondary | ICD-10-CM | POA: Diagnosis not present

## 2020-01-10 DIAGNOSIS — R29898 Other symptoms and signs involving the musculoskeletal system: Secondary | ICD-10-CM | POA: Diagnosis not present

## 2020-01-10 DIAGNOSIS — R2689 Other abnormalities of gait and mobility: Secondary | ICD-10-CM | POA: Diagnosis not present

## 2020-01-10 DIAGNOSIS — M79604 Pain in right leg: Secondary | ICD-10-CM | POA: Diagnosis not present

## 2020-01-14 DIAGNOSIS — Z452 Encounter for adjustment and management of vascular access device: Secondary | ICD-10-CM | POA: Diagnosis not present

## 2020-01-24 ENCOUNTER — Encounter: Payer: Self-pay | Admitting: Physical Medicine & Rehabilitation

## 2020-01-24 ENCOUNTER — Other Ambulatory Visit: Payer: Self-pay

## 2020-01-24 ENCOUNTER — Encounter
Payer: BC Managed Care – PPO | Attending: Physical Medicine & Rehabilitation | Admitting: Physical Medicine & Rehabilitation

## 2020-01-24 VITALS — BP 130/85 | HR 121 | Temp 98.4°F | Ht 71.0 in | Wt 186.6 lb

## 2020-01-24 DIAGNOSIS — T796XXS Traumatic ischemia of muscle, sequela: Secondary | ICD-10-CM | POA: Diagnosis not present

## 2020-01-24 DIAGNOSIS — G931 Anoxic brain damage, not elsewhere classified: Secondary | ICD-10-CM | POA: Insufficient documentation

## 2020-01-24 DIAGNOSIS — M21379 Foot drop, unspecified foot: Secondary | ICD-10-CM | POA: Diagnosis not present

## 2020-01-24 DIAGNOSIS — G5701 Lesion of sciatic nerve, right lower limb: Secondary | ICD-10-CM | POA: Insufficient documentation

## 2020-01-24 NOTE — Patient Instructions (Signed)
Evidence of right sciatic nerve injury.  There is some recovery of the tibial portion of the nerve.  There is a good chance for hamstring strength improvement as well as sensation at the bottom of the foot Recommend physical therapy referral Recommend repeat EMG/NCV in 6 months

## 2020-01-24 NOTE — Progress Notes (Signed)
EMG/NCV RLE to evaluate for foot drop Please see media section for report  Pt to f/u in 1 mo discuss results and follow up on therapy progress

## 2020-01-28 DIAGNOSIS — N179 Acute kidney failure, unspecified: Secondary | ICD-10-CM | POA: Diagnosis not present

## 2020-02-08 DIAGNOSIS — Z20822 Contact with and (suspected) exposure to covid-19: Secondary | ICD-10-CM | POA: Diagnosis not present

## 2020-02-09 DIAGNOSIS — Z20822 Contact with and (suspected) exposure to covid-19: Secondary | ICD-10-CM | POA: Diagnosis not present

## 2020-02-11 DIAGNOSIS — Z20822 Contact with and (suspected) exposure to covid-19: Secondary | ICD-10-CM | POA: Diagnosis not present

## 2020-03-06 ENCOUNTER — Encounter: Payer: BC Managed Care – PPO | Admitting: Physical Medicine & Rehabilitation

## 2020-03-08 DIAGNOSIS — Z1152 Encounter for screening for COVID-19: Secondary | ICD-10-CM | POA: Diagnosis not present

## 2020-03-14 ENCOUNTER — Telehealth: Payer: Self-pay | Admitting: Gastroenterology

## 2020-03-14 NOTE — Telephone Encounter (Signed)
Sorry to hear this. Chart reviewed. I think okay to resume Remicade as previously scheduled. He can contact [facility] to schedule. He is also due for a follow up clinic visit if you can help coordinate. Thanks

## 2020-03-14 NOTE — Telephone Encounter (Signed)
Spoke with patient to clarify date of last Remicade infusion but he was unsure of that information. He states that he receives his infusion at [facility], I was going to call to clarify date of last infusion but the office is currently closed at this time. Please advise if patient will be able resume Remicade infusion or will he need to start over with the induction dose? Thanks

## 2020-03-14 NOTE — Telephone Encounter (Signed)
Spoke with patient and advised that he is okay to resume Remicade infusions as previously scheduled, he is aware that he will need to contact [facility] on Monday to set up his appt. Patient has been scheduled for a follow up visit with Dr. Havery Moros on Friday, 03/28/2020 at 3:40 PM. Patient verbalized understanding of all information and had no concerns at the end of the call.

## 2020-03-14 NOTE — Telephone Encounter (Signed)
Pt states that he had a stroke at the beginning of September last year. His remicade infusions had to be stopped due to that. He wants to know if it is ok to begin infusions again.

## 2020-03-18 NOTE — Telephone Encounter (Signed)
Rosann Auerbach from [facility] called requesting an order for IV solumedrol prior to patient's next couple of infusions, she states that she is worried the patient will have a reaction since it has been so long since his last infusion, she states she would feel better if he was pre-medicated. New orders will need to be faxed to [facility] at [phone]. Please advise, thank you

## 2020-03-18 NOTE — Telephone Encounter (Signed)
[name] from [facility] is requesting a call back from a nurse to discuss the pt's Remicade infusion.

## 2020-03-18 NOTE — Telephone Encounter (Signed)
Lm on vm for Marsha in the infusion suite to return call.

## 2020-03-18 NOTE — Telephone Encounter (Signed)
That's fine. I defer to them in regards to dosing of this in regards to pre-medication for anti-TNF. Thanks

## 2020-03-19 NOTE — Telephone Encounter (Signed)
New Remicade orders have been faxed to [facility].   Induction dose: Remicade 35m/kg at 0, 2 weeks, and 6 weeks Maintenance dose: Remicade 584m kg every 8 weeks  Solumedrol 40 mg IV for induction dose and pre-medicate with Tylenol 650 mg and Benadryl 50 mg.

## 2020-03-19 NOTE — Telephone Encounter (Signed)
That is okay with me. Thanks

## 2020-03-19 NOTE — Telephone Encounter (Signed)
Spoke with Rosann Auerbach at [facility] she stated that she consulted with staff and they decided it would be best to reload patient from the beginning and have Solumedrol 40 mg IV for loading dose and to also have the patient pre-medicate with Tylenol 650 mg and Benadryl 50 mg. OK to send new order? Please advise, thank you.

## 2020-03-20 ENCOUNTER — Telehealth: Payer: Self-pay | Admitting: Gastroenterology

## 2020-03-20 NOTE — Telephone Encounter (Signed)
[facility] called regarding this patient please call [phone] ext [phone]

## 2020-03-20 NOTE — Telephone Encounter (Signed)
Returned call to [facility], received a message that the office is currently closed. Will attempt tomorrow.

## 2020-03-21 ENCOUNTER — Telehealth: Payer: Self-pay | Admitting: Gastroenterology

## 2020-03-21 ENCOUNTER — Encounter: Payer: BC Managed Care – PPO | Admitting: Physical Medicine & Rehabilitation

## 2020-03-21 NOTE — Telephone Encounter (Signed)
[facility] rheumatology Margarita Grizzle [phone]  Michel Harrow Y 19 minutes ago (10:19 AM)   [facility] rheumatology ext [phone] returned missed call.. Please advise     Spoke with Margarita Grizzle, she is requesting latest office note for patient so that she can complete authorization. She asked that the notes be faxed to [phone]. Records faxed via epic.

## 2020-03-21 NOTE — Telephone Encounter (Signed)
See 03/20/20 telephone encounter for more information.

## 2020-03-21 NOTE — Telephone Encounter (Addendum)
Returned call to Nemiah Commander at [facility], lm on her vm for her to return call.

## 2020-03-24 ENCOUNTER — Ambulatory Visit: Payer: BC Managed Care – PPO | Attending: Physical Medicine & Rehabilitation | Admitting: Physical Therapy

## 2020-03-24 ENCOUNTER — Other Ambulatory Visit: Payer: Self-pay

## 2020-03-24 DIAGNOSIS — M6281 Muscle weakness (generalized): Secondary | ICD-10-CM | POA: Insufficient documentation

## 2020-03-24 DIAGNOSIS — M21371 Foot drop, right foot: Secondary | ICD-10-CM | POA: Diagnosis not present

## 2020-03-24 DIAGNOSIS — R2689 Other abnormalities of gait and mobility: Secondary | ICD-10-CM

## 2020-03-24 DIAGNOSIS — R29818 Other symptoms and signs involving the nervous system: Secondary | ICD-10-CM
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
## 2020-03-25 ENCOUNTER — Other Ambulatory Visit: Payer: Self-pay | Admitting: Physical Medicine & Rehabilitation

## 2020-03-28 ENCOUNTER — Ambulatory Visit: Payer: BC Managed Care – PPO | Admitting: Gastroenterology

## 2020-03-28 ENCOUNTER — Other Ambulatory Visit (INDEPENDENT_AMBULATORY_CARE_PROVIDER_SITE_OTHER): Payer: BC Managed Care – PPO

## 2020-03-28 ENCOUNTER — Encounter: Payer: Self-pay | Admitting: Gastroenterology

## 2020-03-28 VITALS — BP 130/84 | HR 128 | Ht 70.5 in | Wt 204.0 lb

## 2020-03-28 DIAGNOSIS — Z23 Encounter for immunization: Secondary | ICD-10-CM

## 2020-03-28 DIAGNOSIS — Z79899 Other long term (current) drug therapy: Secondary | ICD-10-CM

## 2020-03-28 DIAGNOSIS — K50119 Crohn's disease of large intestine with unspecified complications: Secondary | ICD-10-CM | POA: Diagnosis not present

## 2020-03-28 LAB — COMPREHENSIVE METABOLIC PANEL
ALT: 34 U/L (ref 0–53)
AST: 25 U/L (ref 0–37)
Albumin: 4.4 g/dL (ref 3.5–5.2)
Alkaline Phosphatase: 88 U/L (ref 39–117)
BUN: 18 mg/dL (ref 6–23)
CO2: 28 mEq/L (ref 19–32)
Calcium: 9.7 mg/dL (ref 8.4–10.5)
Chloride: 101 mEq/L (ref 96–112)
Creatinine, Ser: 1.39 mg/dL (ref 0.40–1.50)
GFR: 59.9 mL/min — ABNORMAL LOW (ref >60.00)
Glucose, Bld: 99 mg/dL (ref 70–99)
Potassium: 4 mEq/L (ref 3.5–5.1)
Sodium: 137 mEq/L (ref 135–145)
Total Bilirubin: 0.5 mg/dL (ref 0.2–1.2)
Total Protein: 7.7 g/dL (ref 6.0–8.3)

## 2020-03-28 LAB — CBC WITH DIFFERENTIAL/PLATELET
Basophils Absolute: 0.1 10*3/uL (ref 0.0–0.1)
Basophils Relative: 0.7 % (ref 0.0–3.0)
Eosinophils Absolute: 0.1 10*3/uL (ref 0.0–0.7)
Eosinophils Relative: 0.6 % (ref 0.0–5.0)
HCT: 35.8 % — ABNORMAL LOW (ref 39.0–52.0)
Hemoglobin: 12.3 g/dL — ABNORMAL LOW (ref 13.0–17.0)
Lymphocytes Relative: 16.6 % (ref 12.0–46.0)
Lymphs Abs: 1.7 10*3/uL (ref 0.7–4.0)
MCHC: 34.5 g/dL (ref 30.0–36.0)
MCV: 85.8 fl (ref 78.0–100.0)
Monocytes Absolute: 0.6 10*3/uL (ref 0.1–1.0)
Monocytes Relative: 6.1 % (ref 3.0–12.0)
Neutro Abs: 7.6 10*3/uL (ref 1.4–7.7)
Neutrophils Relative %: 76 % (ref 43.0–77.0)
Platelets: 248 10*3/uL (ref 150.0–400.0)
RBC: 4.17 Mil/uL — ABNORMAL LOW (ref 4.22–5.81)
RDW: 15.3 % (ref 11.5–15.5)
WBC: 10 10*3/uL (ref 4.0–10.5)
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
## 2020-03-28 NOTE — Patient Instructions (Addendum)
If you are age 49 or older, your body mass index should be between 23-30. Your Body mass index is 28.86 kg/m. If this is out of the aforementioned range listed, please consider follow up with your Primary Care Provider.  If you are age 73 or younger, your body mass index should be between 19-25. Your Body mass index is 28.86 kg/m. If this is out of the aformentioned range listed, please consider follow up with your Primary Care Provider.   Please go to the lab in the basement of our building to have lab work done as you leave today. Hit "B" for basement when you get on the elevator.  When the doors open the lab is on your left.  We will call you with the results. Thank you.  Due to recent changes in healthcare laws, you may see the results of your imaging and laboratory studies on MyChart before your provider has had a chance to review them.  We understand that in some cases there may be results that are confusing or concerning to you. Not all laboratory results come back in the same time frame and the provider may be waiting for multiple results in order to interpret others.  Please give [place] 48 hours in order for your provider to thoroughly review all the results before contacting the office for clarification of your results.   We are giving you the Pneumococcal 23 vaccine today.  You will be due for a follow up in 6 months (09-2020).  Thank you for entrusting me with your care and for choosing [facility], Dr. [place] Cellar

## 2020-03-30 LAB — QUANTIFERON-TB GOLD PLUS
Mitogen-NIL: >10 IU/mL
NIL: 0.02 IU/mL
QuantiFERON-TB Gold Plus: NEGATIVE
TB1-NIL: 0.02 IU/mL
TB2-NIL: 0 IU/mL

## 2020-04-01 ENCOUNTER — Encounter: Payer: Self-pay | Admitting: Physical Therapy

## 2020-04-01 ENCOUNTER — Ambulatory Visit: Payer: BC Managed Care – PPO | Attending: Physical Medicine & Rehabilitation | Admitting: Physical Therapy

## 2020-04-01 ENCOUNTER — Other Ambulatory Visit: Payer: Self-pay

## 2020-04-01 DIAGNOSIS — R2689 Other abnormalities of gait and mobility: Secondary | ICD-10-CM | POA: Insufficient documentation

## 2020-04-01 DIAGNOSIS — M21371 Foot drop, right foot: Secondary | ICD-10-CM

## 2020-04-01 DIAGNOSIS — R29818 Other symptoms and signs involving the nervous system: Secondary | ICD-10-CM | POA: Diagnosis not present

## 2020-04-01 DIAGNOSIS — M6281 Muscle weakness (generalized): Secondary | ICD-10-CM | POA: Insufficient documentation

## 2020-04-01 NOTE — Therapy (Signed)
[facility] [address] [place], [place], [address] Phone: [phone]   Fax:  [phone]  Physical Therapy Treatment  Patient Details  Name: [name] MRN: [id number] Date of Birth: May 14, 1970 Referring Provider (PT): Dr. Letta Pate   Encounter Date: 04/01/2020   PT End of Session - 04/01/20 0851    Visit Number 2    Number of Visits 5   will reasssess after 1 month - may continue PT at that time   Date for PT Re-Evaluation 05/02/20    Authorization Type BCBS    Authorization - Visit Number 2    Authorization - Number of Visits 30    PT Start Time [phone]    PT Stop Time 0925    PT Time Calculation (min) 39 min    Equipment Utilized During Treatment Other (comment)   pt wore his Rt AFO (Walk on)   Activity Tolerance Patient tolerated treatment well    Behavior During Therapy [facility] for tasks assessed/performed           Past Medical History:  Diagnosis Date  . Allergy   . Crohn's colitis ([place])   . Depression   . Pulmonary embolism ([place])   . Stroke [facility])     Past Surgical History:  Procedure Laterality Date  . COLOSTOMY  March 2011  . Colostomy reversal  January 2012  . HIP SURGERY  R hip surgery   . IR FLUORO GUIDE CV LINE RIGHT  10/30/2019  . IR US GUIDE VASC ACCESS RIGHT  10/30/2019    There were no vitals filed for this visit.   Subjective Assessment - 04/01/20 0849    Subjective No new complaitns. No falls  to report. Reports doing the HEP/stretches at home.    Pertinent History Neuropathy of Rt sciatic nerve, anoxic BI, embolic CVA, acute renal failure due to rhabdomyolisis; Crohn's disease, h/o PE, depression, alcohol use disorder, substance use disorder    Patient Stated Goals Move right foot better; regain feeling in Rt foot    Currently in Pain? Yes    Pain Score 5     Pain Location Leg    Pain Orientation Right    Pain Descriptors / Indicators Tingling;Numbness;Pins and needles    Pain  Radiating Towards knee down    Pain Onset More than a month ago    Pain Frequency Constant    Aggravating Factors  none- stays the same    Pain Relieving Factors none                [facility] Adult PT Treatment/Exercise - 04/01/20 2694      Ambulation/Gait   Ambulation/Gait Yes    Ambulation/Gait Assistance 6: Modified independent (Device/Increase time)    Ambulation Distance (Feet) --   into/out of session   Assistive device None;Other (Comment)   right posterior [place] walkon brace   Gait Pattern Decreased weight shift to right;Decreased hip/knee flexion - right;Decreased step length - right;Decreased dorsiflexion - right    Ambulation Surface Level;Indoor    Gait Comments pt noted to have increased right knee flexion with stance.      Neuro Re-ed    Neuro Re-ed Details  Bioness initiated today to right anterior tib. With use of quick fit electrodes mostly elicited inversion and unable to move electrode into a place for just DF. Trialed steering electrode next with pt unable to tolerate intensity needed to elicit good contractions. Then switched to small round cloth electrods with same  issues as pt unable to tolerate intensity needed to elicit good contractions. Went back to use of quick fit with inversion corrected as best as able concurrent with ex's.      Exercises   Exercises Other Exercises    Other Exercises  concurrent with Bioness to right ant tib- right foot on balance board in ant/post direction with assist for DF with on time of 5 sec's, rest of 8 sec's for ~8-9 minutes.      Acupuncturist Location right anterior tib    Printmaker Action for increased muscle/nerve activation and strengthening.    Electrical Stimulation Parameters refer to tablet 1 for adjusted parameters.    Electrical Stimulation Goals Strength;Neuromuscular facilitation                PT Short Term Goals - 03/24/20 1932      PT SHORT TERM GOAL  #1   Title same as LTG's             PT Long Term Goals - 03/24/20 1932      PT LONG TERM GOAL #1   Title Trial of Bioness for Rt dorsiflexor stimulation and strengthening for Rt foot drop.    Time 4    Period Weeks    Status New    Target Date 05/02/20      PT LONG TERM GOAL #2   Title Independent in HEP for RLE strengthening.    Time 4    Period Weeks    Status New    Target Date 05/02/20      PT LONG TERM GOAL #3   Title Assess for appropriateness/benefit of home NMES unit for Rt dorsiflexor strengthening (if Bioness is not applicable).    Time 4    Period Weeks    Status New    Target Date 05/02/20                 Plan - 04/01/20 0851    Clinical Impression Statement Today's skilled session focused on set up of Bioness to right anterior tib to trail if NMES can be of benefit. Decreased reponse noted this session, with mostly inversion occuring. Will continue for few sessions to see if a better responce can be ilicited with continued use.    Personal Factors and Comorbidities Behavior Pattern;Comorbidity 2;Time since onset of injury/illness/exacerbation;Past/Current Experience    Comorbidities Crohn's disease, h/o PE, depression, substance use and alcohol use disorder, neuropathy of Rt sciatic nerve, anoxic BI    Examination-Activity Limitations Locomotion Level;Squat;Stairs    Examination-Participation Restrictions Community Activity;Yard Work;Shop    Stability/Clinical Decision Making Evolving/Moderate complexity    Rehab Potential Fair    PT Frequency 1x / week    PT Duration 4 weeks   will reassess after 4 weeks and may continue if pt is progressing   PT Treatment/Interventions ADLs/Self Care Home Management;Electrical Stimulation;Gait training;Therapeutic activities;Therapeutic exercise;Balance training;Neuromuscular re-education;Patient/family education;Passive range of motion;Orthotic Fit/Training    PT Next Visit Plan continue with  use of BIoness/e-stim  with ex's for strengthening to assess if pt will benefit from use of- NMES home unit?   add as appropriate - Rt hamstring and Rt ankle strengthening to HEP.    PT Willow and Agree with Plan of Care Patient           Patient will benefit from skilled therapeutic intervention in order to improve the following deficits and impairments:  Abnormal  gait,Decreased balance,Decreased range of motion,Decreased strength,Impaired sensation,Impaired flexibility  Visit Diagnosis: Foot drop, right  Muscle weakness (generalized)  Other abnormalities of gait and mobility  Other symptoms and signs involving the nervous system     Problem List Patient Active Problem List   Diagnosis Date Noted  . Anemia of chronic disease 11/22/2019  . Neuropathy 11/22/2019  . Embolic stroke ([place]) 12/45/8099  . Anoxic brain injury ([place]) 11/09/2019  . Rhabdomyolysis 11/09/2019  . Acute renal failure due to rhabdomyolysis ([place]) 10/27/2019  . Hyperkalemia 10/27/2019  . Transaminitis 10/27/2019  . High anion gap metabolic acidosis 83/38/2505  . Alcohol use 10/27/2019  . Substance use disorder 10/27/2019  . Acute cerebral infarction ([place]) 10/27/2019  . Hypotension 10/27/2019  . Crohn's disease ([place]) 10/27/2019  . Lactic acidosis 10/27/2019    Willow Ora, PTA, [facility] Outpatient Neuro [facility] [address] [place], [place] [address] [phone] 04/01/20, 11:48 AM   Name: [name] MRN: [id number] Date of Birth: January 09, 1971

## 2020-04-07 DIAGNOSIS — R5383 Other fatigue: Secondary | ICD-10-CM | POA: Diagnosis not present

## 2020-04-07 DIAGNOSIS — K5010 Crohn's disease of large intestine without complications: Secondary | ICD-10-CM | POA: Diagnosis not present

## 2020-04-07 DIAGNOSIS — Z111 Encounter for screening for respiratory tuberculosis: Secondary | ICD-10-CM | POA: Diagnosis not present

## 2020-04-07 DIAGNOSIS — Z79899 Other long term (current) drug therapy: Secondary | ICD-10-CM | POA: Diagnosis not present

## 2020-04-08 ENCOUNTER — Encounter: Payer: Self-pay | Admitting: Physical Medicine & Rehabilitation

## 2020-04-08 ENCOUNTER — Encounter: Payer: Self-pay | Admitting: Physical Therapy

## 2020-04-08 ENCOUNTER — Ambulatory Visit: Payer: BC Managed Care – PPO | Admitting: Physical Therapy

## 2020-04-08 ENCOUNTER — Encounter
Payer: BC Managed Care – PPO | Attending: Physical Medicine & Rehabilitation | Admitting: Physical Medicine & Rehabilitation

## 2020-04-08 ENCOUNTER — Other Ambulatory Visit: Payer: Self-pay

## 2020-04-08 VITALS — BP 139/80 | HR 71 | Temp 98.3°F | Ht 71.0 in | Wt 203.0 lb

## 2020-04-08 DIAGNOSIS — M6281 Muscle weakness (generalized): Secondary | ICD-10-CM

## 2020-04-08 DIAGNOSIS — M21379 Foot drop, unspecified foot: Secondary | ICD-10-CM | POA: Diagnosis not present

## 2020-04-08 DIAGNOSIS — R2689 Other abnormalities of gait and mobility: Secondary | ICD-10-CM | POA: Diagnosis not present

## 2020-04-08 DIAGNOSIS — R202 Paresthesia of skin: Secondary | ICD-10-CM | POA: Diagnosis not present

## 2020-04-08 DIAGNOSIS — M21371 Foot drop, right foot: Secondary | ICD-10-CM

## 2020-04-08 DIAGNOSIS — R200 Anesthesia of skin: Secondary | ICD-10-CM | POA: Diagnosis not present

## 2020-04-08 DIAGNOSIS — R29818 Other symptoms and signs involving the nervous system: Secondary | ICD-10-CM | POA: Diagnosis not present

## 2020-04-08 DIAGNOSIS — G5701 Lesion of sciatic nerve, right lower limb: Secondary | ICD-10-CM

## 2020-04-08 NOTE — Patient Instructions (Signed)
Will check EMG to evaluate location of Right median nerve injury .  This will help [place] with treatment options.
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
## 2020-04-08 NOTE — Therapy (Signed)
[facility] [address] [place], [place], [address] Phone: [phone]   Fax:  [phone]  Physical Therapy Treatment  Patient Details  Name: [name] MRN: [id number] Date of Birth: 06-18-1970 Referring Provider (PT): Dr. Letta Pate   Encounter Date: 04/08/2020   PT End of Session - 04/08/20 0852    Visit Number 3    Number of Visits 5   will reasssess after 1 month - may continue PT at that time   Date for PT Re-Evaluation 05/02/20    Authorization Type BCBS    Authorization - Visit Number 3    Authorization - Number of Visits 30    PT Start Time 2993    PT Stop Time 0929    PT Time Calculation (min) 39 min    Equipment Utilized During Treatment Other (comment)   pt wore his Rt AFO (Walk on)   Activity Tolerance Patient tolerated treatment well    Behavior During Therapy [facility] for tasks assessed/performed           Past Medical History:  Diagnosis Date  . Allergy   . Crohn's colitis ([place])   . Depression   . Pulmonary embolism ([place])   . Stroke [facility])     Past Surgical History:  Procedure Laterality Date  . COLOSTOMY  March 2011  . Colostomy reversal  January 2012  . HIP SURGERY  R hip surgery   . IR FLUORO GUIDE CV LINE RIGHT  10/30/2019  . IR US GUIDE VASC ACCESS RIGHT  10/30/2019    There were no vitals filed for this visit.   Subjective Assessment - 04/08/20 0852    Subjective No new complaints. No falls or pain. Felt okay after use of Bioness last session.    Pertinent History Neuropathy of Rt sciatic nerve, anoxic BI, embolic CVA, acute renal failure due to rhabdomyolisis; Crohn's disease, h/o PE, depression, alcohol use disorder, substance use disorder    Patient Stated Goals Move right foot better; regain feeling in Rt foot    Currently in Pain? No/denies    Pain Score 0-No pain                [facility] Adult PT Treatment/Exercise - 04/08/20 0856      Exercises   Exercises Other  Exercises    Other Exercises  concurrent with Bioness to right ant tib: right foot on rockerboard in ant/post direction with assist from PTA for increased DF with on times, rest with off times for 5-6 minutes; then AA with manual overpressure for DF stretch with on times, rest with off times for 15 reps; seated at edge of mat hovering over mat with on times, rest with off times for 10 reps; then with strap around right foot for isometric strengthening by pushing into the strap with on times, rest with off times for 15 reps. ended with back to rockerboard with no assist from PTA for DF with on time, rest with off times for ~5 minutes.      Acupuncturist Location right anterior tib    Chartered certified accountant for increased muscle/nerve activation and strengthening    Electrical Stimulation Parameters refer to tablet 1 for adjusted parameters; quickfit electrodes    Electrical Stimulation Goals Strength;Neuromuscular facilitation                PT Short Term Goals - 03/24/20 1932      PT SHORT TERM GOAL #1  Title same as LTG's             PT Long Term Goals - 03/24/20 1932      PT LONG TERM GOAL #1   Title Trial of Bioness for Rt dorsiflexor stimulation and strengthening for Rt foot drop.    Time 4    Period Weeks    Status New    Target Date 05/02/20      PT LONG TERM GOAL #2   Title Independent in HEP for RLE strengthening.    Time 4    Period Weeks    Status New    Target Date 05/02/20      PT LONG TERM GOAL #3   Title Assess for appropriateness/benefit of home NMES unit for Rt dorsiflexor strengthening (if Bioness is not applicable).    Time 4    Period Weeks    Status New    Target Date 05/02/20                 Plan - 04/08/20 0852    Clinical Impression Statement Today's skilled session continued with use of Bioness to right LE concurrent with ex's for improved ankle range of motion and DF strengthening. Mild  movement noted with use of Bioness, mostly with inversion. Unable to elicit pure DF at this time. Will continue x1 more session with Bioness as pt did have more reponse this session than with previous session.    Personal Factors and Comorbidities Behavior Pattern;Comorbidity 2;Time since onset of injury/illness/exacerbation;Past/Current Experience    Comorbidities Crohn's disease, h/o PE, depression, substance use and alcohol use disorder, neuropathy of Rt sciatic nerve, anoxic BI    Examination-Activity Limitations Locomotion Level;Squat;Stairs    Examination-Participation Restrictions Community Activity;Yard Work;Shop    Stability/Clinical Decision Making Evolving/Moderate complexity    Rehab Potential Fair    PT Frequency 1x / week    PT Duration 4 weeks   will reassess after 4 weeks and may continue if pt is progressing   PT Treatment/Interventions ADLs/Self Care Home Management;Electrical Stimulation;Gait training;Therapeutic activities;Therapeutic exercise;Balance training;Neuromuscular re-education;Patient/family education;Passive range of motion;Orthotic Fit/Training    PT Next Visit Plan continue with  use of BIoness/e-stim with ex's for strengthening to assess if pt will benefit from use of- NMES home unit?   add as appropriate - Rt hamstring and Rt ankle strengthening to HEP.    PT Home Exercise Plan [employer] [email or web address]    Consulted and Agree with Plan of Care Patient           Patient will benefit from skilled therapeutic intervention in order to improve the following deficits and impairments:  Abnormal gait,Decreased balance,Decreased range of motion,Decreased strength,Impaired sensation,Impaired flexibility  Visit Diagnosis: Foot drop, right  Muscle weakness (generalized)  Other abnormalities of gait and mobility     Problem List Patient Active Problem List   Diagnosis Date Noted  . Anemia of chronic disease 11/22/2019  . Neuropathy 11/22/2019  . Embolic stroke ([place])  15/83/0940  . Anoxic brain injury ([place]) 11/09/2019  . Rhabdomyolysis 11/09/2019  . Acute renal failure due to rhabdomyolysis ([place]) 10/27/2019  . Hyperkalemia 10/27/2019  . Transaminitis 10/27/2019  . High anion gap metabolic acidosis 76/80/8811  . Alcohol use 10/27/2019  . Substance use disorder 10/27/2019  . Acute cerebral infarction ([place]) 10/27/2019  . Hypotension 10/27/2019  . Crohn's disease ([place]) 10/27/2019  . Lactic acidosis 10/27/2019    Willow Ora, PTA, [facility] Neuro [facility] [address] [place], [place] [address] [phone]  04/08/20, 2:33 PM   Name: [name] MRN: [id number] Date of Birth: Nov 21, 1970

## 2020-04-17 ENCOUNTER — Encounter: Payer: Self-pay | Admitting: Physical Therapy

## 2020-04-17 ENCOUNTER — Other Ambulatory Visit: Payer: Self-pay

## 2020-04-17 ENCOUNTER — Ambulatory Visit: Payer: BC Managed Care – PPO | Admitting: Physical Therapy

## 2020-04-17 DIAGNOSIS — M21371 Foot drop, right foot: Secondary | ICD-10-CM | POA: Diagnosis not present

## 2020-04-17 DIAGNOSIS — R29818 Other symptoms and signs involving the nervous system: Secondary | ICD-10-CM | POA: Diagnosis not present

## 2020-04-17 DIAGNOSIS — M6281 Muscle weakness (generalized): Secondary | ICD-10-CM

## 2020-04-17 DIAGNOSIS — R2689 Other abnormalities of gait and mobility: Secondary | ICD-10-CM

## 2020-04-17 NOTE — Patient Instructions (Signed)
Access Code: [email or web address] URL: [email or web address] Date: 04/17/2020 Prepared by: Willow Ora  Exercises Seated Hamstring Stretch with Chair - 1 x daily - 7 x weekly - 3 sets - 1 reps - 30-60 hold Gastroc Stretch on Wall - 3 x daily - 7 x weekly - 3 sets - 1 reps - 30-60 hold Standing Soleus Stretch on Step - 3 x daily - 7 x weekly - 1 sets - 1 reps - 30-60 hold Seated Hamstring Curl with Anchored Resistance - 1 x daily - 7 x weekly - 1-2 sets - 10 reps - 3 hold Seated Ankle Plantar Flexion with Resistance Loop - 1-2 x daily - 7 x weekly - 1 sets - 10 reps - 3 hold  Added to HEP this session: Heel rises with counter support - 1 x daily - 5 x weekly - 1 sets - 10 reps Standing Single Leg Stance with Counter Support - 1 x daily - 5 x weekly - 1 sets - 3 reps - 10 hold

## 2020-04-17 NOTE — Therapy (Signed)
[facility] [address] [place], [place], [address] Phone: [phone]   Fax:  [phone]  Physical Therapy Treatment  Patient Details  Name: MCCLELLAN DEMARAIS MRN: [id number] Date of Birth: 11-12-1970 Referring Provider (PT): Dr. Letta Pate   Encounter Date: 04/17/2020   PT End of Session - 04/17/20 0852    Visit Number 4    Number of Visits 5   will reasssess after 1 month - may continue PT at that time   Date for PT Re-Evaluation 05/02/20    Authorization Type BCBS    Authorization - Visit Number 4    Authorization - Number of Visits 30    PT Start Time 0848    PT Stop Time 0905   discharge visit, not all the time was needed   PT Time Calculation (min) 17 min    Equipment Utilized During Treatment Other (comment)   pt wore his Rt AFO (Walk on)   Activity Tolerance Patient tolerated treatment well    Behavior During Therapy [facility] for tasks assessed/performed           Past Medical History:  Diagnosis Date  . Allergy   . Crohn's colitis ([place])   . Depression   . Pulmonary embolism ([place])   . Stroke [facility])     Past Surgical History:  Procedure Laterality Date  . COLOSTOMY  March 2011  . Colostomy reversal  January 2012  . HIP SURGERY  R hip surgery   . IR FLUORO GUIDE CV LINE RIGHT  10/30/2019  . IR US GUIDE VASC ACCESS RIGHT  10/30/2019    There were no vitals filed for this visit.   Subjective Assessment - 04/17/20 0852    Subjective No new complaints. Saw Dr. Letta Pate recently. Studies show that the foot drop is due to sciatic neuropathy and he recommended pt continue to use AFO at this time as FES not likely to be of benefit.    Pertinent History Neuropathy of Rt sciatic nerve, anoxic BI, embolic CVA, acute renal failure due to rhabdomyolisis; Crohn's disease, h/o PE, depression, alcohol use disorder, substance use disorder    Patient Stated Goals Move right foot better; regain feeling in Rt foot     Currently in Pain? No/denies    Pain Score 0-No pain                 [facility] Adult PT Treatment/Exercise - 04/17/20 0912      Transfers   Transfers Sit to Stand;Stand to Sit    Sit to Stand 7: Independent    Stand to Sit 7: Independent      Ambulation/Gait   Ambulation/Gait Yes    Ambulation/Gait Assistance 7: Independent    Ambulation Distance (Feet) --   around gym with session   Assistive device None;Other (Comment)   right posterior [place] walkon brace   Gait Pattern Decreased weight shift to right;Decreased hip/knee flexion - right;Decreased step length - right;Decreased dorsiflexion - right    Ambulation Surface Level;Indoor      Exercises   Exercises Other Exercises    Other Exercises  reviewed current HEP and added 2 additonal ex's this session with no issues noted or reported with performance in session today. Refer to [place] for full details. Min guard assist for safety with balance ex's.           Issued to HEP this session:   Access Code: [email or web address] URL: [email or web address] Date: 04/17/2020 Prepared by: Willow Ora  Exercises  Seated Hamstring Stretch with Chair - 1 x daily - 7 x weekly - 3 sets - 1 reps - 30-60 hold Gastroc Stretch on Wall - 3 x daily - 7 x weekly - 3 sets - 1 reps - 30-60 hold Standing Soleus Stretch on Step - 3 x daily - 7 x weekly - 1 sets - 1 reps - 30-60 hold Seated Hamstring Curl with Anchored Resistance - 1 x daily - 7 x weekly - 1-2 sets - 10 reps - 3 hold Seated Ankle Plantar Flexion with Resistance Loop - 1-2 x daily - 7 x weekly - 1 sets - 10 reps - 3 hold  Added to HEP this session: Heel rises with counter support - 1 x daily - 5 x weekly - 1 sets - 10 reps Standing Single Leg Stance with Counter Support - 1 x daily - 5 x weekly - 1 sets - 3 reps - 10 hold      PT Education - 04/17/20 0903    Education Details progrss toward goals. additions to HEP. plan to discharge today.    Person(s) Educated Patient     Methods Explanation;Demonstration;Handout    Comprehension Verbalized understanding;Returned demonstration            PT Short Term Goals - 03/24/20 1932      PT SHORT TERM GOAL #1   Title same as LTG's             PT Long Term Goals - 04/17/20 0853      PT LONG TERM GOAL #1   Title Trial of Bioness for Rt dorsiflexor stimulation and strengthening for Rt foot drop.    Baseline 04/17/20: has been utilized for 2 sessions with no DF action noted.    Status Achieved      PT LONG TERM GOAL #2   Title Independent in HEP for RLE strengthening.    Baseline 04/17/20: met with HEP this session    Time --    Period --    Status Achieved      PT LONG TERM GOAL #3   Title Assess for appropriateness/benefit of home NMES unit for Rt dorsiflexor strengthening (if Bioness is not applicable).    Baseline 04/17/20: per studies that were reviewed at pt's last visit with Dr. Letta Pate FES will not be of benefit at this time due to nature of foot drop.    Status Achieved                 Plan - 04/17/20 0853    Clinical Impression Statement Discussed recent visit with Dr. Letta Pate and that FES does not appear to be of benefit at this time. Pt in agreement with discharge as this was the sole reason for being referred to PT. Reviewed and updated HEP. Pt to be discharged today.    Personal Factors and Comorbidities Behavior Pattern;Comorbidity 2;Time since onset of injury/illness/exacerbation;Past/Current Experience    Comorbidities Crohn's disease, h/o PE, depression, substance use and alcohol use disorder, neuropathy of Rt sciatic nerve, anoxic BI    Examination-Activity Limitations Locomotion Level;Squat;Stairs    Examination-Participation Restrictions Community Activity;Yard Work;Shop    Stability/Clinical Decision Making Evolving/Moderate complexity    Rehab Potential Fair    PT Frequency 1x / week    PT Duration 4 weeks   will reassess after 4 weeks and may continue if pt is  progressing   PT Treatment/Interventions ADLs/Self Care Home Management;Electrical Stimulation;Gait training;Therapeutic activities;Therapeutic exercise;Balance training;Neuromuscular re-education;Patient/family education;Passive range of motion;Orthotic Fit/Training  PT Next Visit Plan discharge from PT this session    PT [place] and Agree with Plan of Care Patient           Patient will benefit from skilled therapeutic intervention in order to improve the following deficits and impairments:  Abnormal gait,Decreased balance,Decreased range of motion,Decreased strength,Impaired sensation,Impaired flexibility  Visit Diagnosis: Foot drop, right  Muscle weakness (generalized)  Other abnormalities of gait and mobility     Problem List Patient Active Problem List   Diagnosis Date Noted  . Anemia of chronic disease 11/22/2019  . Neuropathy 11/22/2019  . Embolic stroke ([place]) 96/78/9381  . Anoxic brain injury ([place]) 11/09/2019  . Rhabdomyolysis 11/09/2019  . Acute renal failure due to rhabdomyolysis ([place]) 10/27/2019  . Hyperkalemia 10/27/2019  . Transaminitis 10/27/2019  . High anion gap metabolic acidosis 01/75/1025  . Alcohol use 10/27/2019  . Substance use disorder 10/27/2019  . Acute cerebral infarction ([place]) 10/27/2019  . Hypotension 10/27/2019  . Crohn's disease ([place]) 10/27/2019  . Lactic acidosis 10/27/2019    Willow Ora, PTA, [facility] [address] [place], [place] [address] [phone] 04/17/20, 9:19 AM   Name: SHANNEN VERNON MRN: [id number] Date of Birth: [birth year]

## 2020-04-21 DIAGNOSIS — K5010 Crohn's disease of large intestine without complications: Secondary | ICD-10-CM | POA: Diagnosis not present

## 2020-04-22 ENCOUNTER — Ambulatory Visit: Payer: BC Managed Care – PPO | Admitting: Physical Therapy

## 2020-05-06 ENCOUNTER — Encounter
Payer: BC Managed Care – PPO | Attending: Physical Medicine & Rehabilitation | Admitting: Physical Medicine & Rehabilitation

## 2020-05-06 ENCOUNTER — Other Ambulatory Visit: Payer: Self-pay

## 2020-05-06 ENCOUNTER — Encounter: Payer: Self-pay | Admitting: Physical Medicine & Rehabilitation

## 2020-05-06 VITALS — BP 128/88 | HR 111 | Temp 98.9°F | Ht 71.0 in | Wt 208.0 lb

## 2020-05-06 DIAGNOSIS — R202 Paresthesia of skin: Secondary | ICD-10-CM | POA: Insufficient documentation

## 2020-05-06 DIAGNOSIS — R200 Anesthesia of skin: Secondary | ICD-10-CM

## 2020-05-06 NOTE — Patient Instructions (Addendum)
EMG/NCV demonstrating median>ulnar neuropathy , likely compressive affecting sensory nerves on Right side.  Appears to be between elbow and wrist.  As long as numbness is not painful would hold off on nerve pain medications

## 2020-05-06 NOTE — Progress Notes (Signed)
EMG performed to eval Right digit 1-3 numbness onset during prolong period of unconsciousness.  See media tab for full report Briefly, absent R median and ulnar distal latencies, normal median motor distal latency but absent median motor response with proximal stim Looks like compressive neuropathy of median and ulnar sensory nerves and conduction block of mtor fibers innervating APB  No need for gabapentin given parasthesia are not painful  PMR f/u in 6 mo

## 2020-05-19 DIAGNOSIS — K5010 Crohn's disease of large intestine without complications: Secondary | ICD-10-CM | POA: Diagnosis not present

## 2020-05-26 DIAGNOSIS — N2581 Secondary hyperparathyroidism of renal origin: Secondary | ICD-10-CM | POA: Diagnosis not present

## 2020-05-26 DIAGNOSIS — D631 Anemia in chronic kidney disease: Secondary | ICD-10-CM | POA: Diagnosis not present

## 2020-05-26 DIAGNOSIS — N189 Chronic kidney disease, unspecified: Secondary | ICD-10-CM | POA: Diagnosis not present

## 2020-05-26 DIAGNOSIS — N179 Acute kidney failure, unspecified: Secondary | ICD-10-CM | POA: Diagnosis not present

## 2020-05-26 DIAGNOSIS — I129 Hypertensive chronic kidney disease with stage 1 through stage 4 chronic kidney disease, or unspecified chronic kidney disease: Secondary | ICD-10-CM | POA: Diagnosis not present

## 2020-07-14 DIAGNOSIS — K5010 Crohn's disease of large intestine without complications: Secondary | ICD-10-CM | POA: Diagnosis not present

## 2020-09-08 DIAGNOSIS — K5010 Crohn's disease of large intestine without complications: Secondary | ICD-10-CM | POA: Diagnosis not present

## 2020-11-03 DIAGNOSIS — K5010 Crohn's disease of large intestine without complications: Secondary | ICD-10-CM | POA: Diagnosis not present

## 2020-11-06 ENCOUNTER — Encounter: Payer: BC Managed Care – PPO | Admitting: Physical Medicine & Rehabilitation

## 2020-11-13 ENCOUNTER — Encounter
Payer: BC Managed Care – PPO | Attending: Physical Medicine & Rehabilitation | Admitting: Physical Medicine & Rehabilitation

## 2020-11-13 ENCOUNTER — Other Ambulatory Visit: Payer: Self-pay

## 2020-11-13 ENCOUNTER — Encounter: Payer: Self-pay | Admitting: Physical Medicine & Rehabilitation

## 2020-11-13 VITALS — BP 130/85 | HR 89 | Temp 97.9°F | Ht 71.0 in | Wt 220.0 lb

## 2020-11-13 DIAGNOSIS — G5701 Lesion of sciatic nerve, right lower limb: Secondary | ICD-10-CM | POA: Insufficient documentation

## 2020-11-13 DIAGNOSIS — G5611 Other lesions of median nerve, right upper limb: Secondary | ICD-10-CM | POA: Insufficient documentation

## 2020-11-13 MED ORDER — GABAPENTIN 300 MG PO CAPS: 300.0000 mg | ORAL_CAPSULE | Freq: Two times a day (BID) | ORAL | 5 refills | Status: DC

## 2020-11-13 NOTE — Progress Notes (Signed)
Subjective:    Patient ID: [name], male    DOB: 01/18/1971, 50 y.o.   MRN: [id number] 50 year old male with history of overdose resulting in prolonged unconscious state as well as rhabdomyolysis and CVA.  Since that time the patient became more aware of his deficits he was complaining of right hand numbness.  He has also had right foot numbness and was evaluated with EMG/NCV February 2022 demonstrating a proximal sciatic neuropathy. He fortunately has come off of hemodialysis with improved kidney function.  1. Charlett Blake, MD (Physician) at 05/06/2020  4:06 PM - Signed   EMG/NCV demonstrating median>ulnar neuropathy , likely compressive affecting sensory nerves on Right side.  Appears to be between elbow and wrist.  As long as numbness is not painful would hold off on nerve pain medications     CIR admission Admit date: 11/09/2019 Discharge date: 11/22/2019  HPI  50 year old male with history of proximal right sciatic nerve compressive lesion as well as right median ulnar sensory neuropathy onset approximately 1 year ago.  He is wearing an AFO on the right lower extremity.  His right upper extremity has no functional limitations.  He has returned to working full-time at a [employer]. The patient continues have numbness on the lateral aspect of his right leg as well as his foot he can feel the medial aspect.  He has feeling in his right hand although it feels a little different compared to the left hand  Pain Inventory Average Pain 6 Pain Right Now 6 My pain is tingling and aching  In the last 24 hours, has pain interfered with the following? General activity 2 Relation with others 2 Enjoyment of life 2 What TIME of day is your pain at its worst? evening Sleep (in general) Fair  Pain is worse with: walking Pain improves with:  no med Relief from Meds: 3  Family History  Problem Relation Age of Onset   Colon polyps Father    Multiple sclerosis Sister     Colon cancer Neg Hx    Esophageal cancer Neg Hx    Stomach cancer Neg Hx    Pancreatic cancer Neg Hx    Liver disease Neg Hx    Social History   Socioeconomic History   Marital status: Married    Spouse name: Not on file   Number of children: 1   Years of education: Not on file   Highest education level: Not on file  Occupational History   Not on file  Tobacco Use   Smoking status: Every Day    Packs/day: 0.50    Types: Cigarettes    Last attempt to quit: 07/29/2016    Years since quitting: 4.2   Smokeless tobacco: Never  Vaping Use   Vaping Use: Never used  Substance and Sexual Activity   Alcohol use: Yes    Comment: 1 per day   Drug use: No   Sexual activity: Yes  Other Topics Concern   Not on file  Social History Narrative   Not on file   Social Determinants of Health   Financial Resource Strain: Not on file  Food Insecurity: Not on file  Transportation Needs: Not on file  Physical Activity: Not on file  Stress: Not on file  Social Connections: Not on file   Past Surgical History:  Procedure Laterality Date   COLOSTOMY  March 2011   Colostomy reversal  January 2012   HIP SURGERY  R hip surgery  IR FLUORO GUIDE CV LINE RIGHT  10/30/2019   IR US GUIDE VASC ACCESS RIGHT  10/30/2019   Past Surgical History:  Procedure Laterality Date   COLOSTOMY  March 2011   Colostomy reversal  January 2012   HIP SURGERY  R hip surgery    IR FLUORO GUIDE CV LINE RIGHT  10/30/2019   IR US GUIDE VASC ACCESS RIGHT  10/30/2019   Past Medical History:  Diagnosis Date   Allergy    Crohn's colitis ([place])    Depression    Pulmonary embolism ([facility])    Stroke ([facility])    BP 130/85   Pulse 89   Temp 97.9 F (36.6 C)   Ht 5' 11"  (1.803 m)   Wt 220 lb (99.8 kg)   SpO2 98%   BMI 30.68 kg/m   Opioid Risk Score:   Fall Risk Score:  `1  Depression screen PHQ 2/9  Depression screen [facility] 2/9 11/13/2020 04/08/2020 01/24/2020 12/11/2019  Decreased Interest 0 0 0 1  Down, Depressed,  Hopeless 0 0 0 1  PHQ - 2 Score 0 0 0 2  Altered sleeping - - - 1  Tired, decreased energy - - - 1  Change in appetite - - - 1  Feeling bad or failure about yourself  - - - 1  Trouble concentrating - - - 0  Moving slowly or fidgety/restless - - - 1  Suicidal thoughts - - - 0  PHQ-9 Score - - - 7     Review of Systems  Constitutional: Negative.   HENT: Negative.    Eyes: Negative.   Respiratory: Negative.    Cardiovascular: Negative.   Gastrointestinal: Negative.   Endocrine: Negative.   Genitourinary: Negative.   Musculoskeletal:        Right leg pain  Skin: Negative.   Allergic/Immunologic: Negative.   Neurological:        Tingling  Hematological: Negative.   Psychiatric/Behavioral: Negative.    All other systems reviewed and are negative.     Objective:   Physical Exam Vitals and nursing note reviewed.  Constitutional:      Appearance: He is obese.  HENT:     Head: Normocephalic and atraumatic.  Eyes:     Extraocular Movements: Extraocular movements intact.     Conjunctiva/sclera: Conjunctivae normal.     Pupils: Pupils are equal, round, and reactive to light.  Skin:    General: Skin is warm and dry.  Neurological:     Mental Status: He is alert and oriented to person, place, and time.     Comments: Oriented x3 Mood and affect are appropriate Sensation is intact to light touch bilateral upper extremities and the see 6 C7-C8 dermatomal distribution Of the lower extremity the patient has a numb patch in the back of the right thigh he has absent sensation below the knee on the lateral aspect of his leg and the entire foot.  He has sparing of sensation at the right medial malleolus and right medial leg  Psychiatric:        Mood and Affect: Mood normal.        Behavior: Behavior normal.   Motor strength is absent ankle dorsiflexion on the right side absent eversion on the right side has 3 - inversion of the right foot and 3/5 toe flexion and plantar flexion of the  ankle.       Assessment & Plan:  #1.  Right proximal sciatic nerve injury compressive no significant improvement in  1 year.  We discussed that nerve recovery in a longer route may take up to 18 months to no full extent.  We discussed other options should he not get significant natural recovery.  Likely has limited options in terms of tendon transfer. We will see the patient back in 6 months, continue gabapentin increased by PCP to 300 twice daily Will refill. 2.  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  Has altered sensation but no dysesthetic pain. Consider repeat EMG at that time

## 2020-12-29 DIAGNOSIS — K5010 Crohn's disease of large intestine without complications: Secondary | ICD-10-CM | POA: Diagnosis not present

## 2021-01-22 ENCOUNTER — Telehealth: Payer: Self-pay | Admitting: Gastroenterology

## 2021-01-22 NOTE — Telephone Encounter (Signed)
Okay thanks for letting me know. I would prefer Inflectra dosed the same as prior Remicade, if you can order. He is due for follow up clinic visit per normal routine, if you can also help coordinate, after the new year. Thanks

## 2021-01-22 NOTE — Telephone Encounter (Signed)
Dr. Havery Moros, Remicade will no longer be the preferred biologic with patient's insurance starting 02/22/21. We can transition patient to a biosimilar. The patient's insurance prefers Avsola or Inflectra with no new authorization requirement. Please advise if OK to fax new orders to [facility] as requested below. Thanks

## 2021-01-22 NOTE — Telephone Encounter (Signed)
Inbound call from patient PCP. [name] states remicade will no longer be preferred by insurance and need an order for Avsola. Can be faxed to [phone] [name]. If you need to call her number is [phone]

## 2021-01-26 NOTE — Telephone Encounter (Signed)
New order for Inflectra 5 mg/kg every 8 weeks has been faxed to [facility] at [place].   Called and spoke with patient. He is aware of the medication change, he knows that it will be the same dose and frequency as his current Remicade infusions. Pt has been scheduled for a follow up with Dr. Havery Moros on Thursday, 03/12/21 at 2:10 pm. Pt verbalized understanding of all information and had no concerns at the end of the call.

## 2021-01-27 NOTE — Telephone Encounter (Signed)
Yes that is okay. Should be same dosing as the Remicade, if they can confirm at [facility]. Thanks

## 2021-01-27 NOTE — Telephone Encounter (Signed)
Dr. Havery Moros, I received a fax today from [facility]. They stated that they do not give Inflectra infusions at their office. They only use Avsola. Please advise if you would like for me to send an order for Avsola. If so, please advise on dosing. Thanks

## 2021-01-28 NOTE — Telephone Encounter (Signed)
Orders for Avsola 5 mg/kg every 8 weeks faxed to [facility] at [place].

## 2021-02-18 ENCOUNTER — Encounter: Payer: Self-pay | Admitting: Gastroenterology

## 2021-02-26 DIAGNOSIS — K5010 Crohn's disease of large intestine without complications: Secondary | ICD-10-CM | POA: Diagnosis not present

## 2021-03-12 ENCOUNTER — Ambulatory Visit: Payer: BC Managed Care – PPO | Admitting: Gastroenterology

## 2021-04-14 ENCOUNTER — Ambulatory Visit: Payer: BC Managed Care – PPO | Admitting: Gastroenterology

## 2021-04-14 ENCOUNTER — Other Ambulatory Visit (INDEPENDENT_AMBULATORY_CARE_PROVIDER_SITE_OTHER): Payer: BC Managed Care – PPO

## 2021-04-14 ENCOUNTER — Encounter: Payer: Self-pay | Admitting: Gastroenterology

## 2021-04-14 VITALS — BP 112/82 | HR 96 | Ht 71.0 in | Wt 225.4 lb

## 2021-04-14 DIAGNOSIS — Z79899 Other long term (current) drug therapy: Secondary | ICD-10-CM

## 2021-04-14 DIAGNOSIS — Z86010 Personal history of colonic polyps: Secondary | ICD-10-CM

## 2021-04-14 DIAGNOSIS — K50119 Crohn's disease of large intestine with unspecified complications: Secondary | ICD-10-CM

## 2021-04-14 LAB — VITAMIN D 25 HYDROXY (VIT D DEFICIENCY, FRACTURES): VITD: 15.04 ng/mL — ABNORMAL LOW (ref 30.00–100.00)
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
## 2021-04-14 NOTE — Patient Instructions (Addendum)
If you are age 50 or older, your body mass index should be between 23-30. Your Body mass index is 31.43 kg/m. If this is out of the aforementioned range listed, please consider follow up with your Primary Care Provider.  If you are age 89 or younger, your body mass index should be between 19-25. Your Body mass index is 31.43 kg/m. If this is out of the aformentioned range listed, please consider follow up with your Primary Care Provider.   ________________________________________________________  The [facility] providers would like to encourage you to use [facility] to communicate with providers for non-urgent requests or questions.  Due to long hold times on the telephone, sending your provider a message by [facility] may be a faster and more efficient way to get a response.  Please allow 48 business hours for a response.  Please remember that this is for non-urgent requests.  _______________________________________________________  Please go to the lab in the basement of our building to have lab work done as you leave today. Hit "B" for basement when you get on the elevator.  When the doors open the lab is on your left.  We will call you with the results. Thank you.  Continue Remicade.  We are giving you a Shingrix order today that you can take to your pharmacy.  Thank you for entrusting me with your care and for choosing [facility], Dr. [place] Cellar

## 2021-04-17 LAB — QUANTIFERON-TB GOLD PLUS
Mitogen-NIL: >10 IU/mL
NIL: 0.04 IU/mL
QuantiFERON-TB Gold Plus: NEGATIVE
TB1-NIL: 0 IU/mL
TB2-NIL: 0 IU/mL

## 2021-04-23 DIAGNOSIS — K5010 Crohn's disease of large intestine without complications: Secondary | ICD-10-CM | POA: Diagnosis not present

## 2021-04-23 DIAGNOSIS — R5383 Other fatigue: Secondary | ICD-10-CM | POA: Diagnosis not present

## 2021-04-23 DIAGNOSIS — Z79899 Other long term (current) drug therapy: Secondary | ICD-10-CM | POA: Diagnosis not present

## 2021-04-23 DIAGNOSIS — Z111 Encounter for screening for respiratory tuberculosis: Secondary | ICD-10-CM | POA: Diagnosis not present

## 2021-05-14 ENCOUNTER — Ambulatory Visit: Payer: BC Managed Care – PPO | Admitting: Physical Medicine & Rehabilitation

## 2021-05-19 ENCOUNTER — Encounter
Payer: BC Managed Care – PPO | Attending: Physical Medicine & Rehabilitation | Admitting: Physical Medicine & Rehabilitation

## 2021-05-19 ENCOUNTER — Other Ambulatory Visit: Payer: Self-pay

## 2021-05-19 ENCOUNTER — Encounter: Payer: Self-pay | Admitting: Physical Medicine & Rehabilitation

## 2021-05-19 VITALS — BP 117/83 | HR 124 | Ht 71.0 in | Wt 225.0 lb

## 2021-05-19 DIAGNOSIS — M21379 Foot drop, unspecified foot: Secondary | ICD-10-CM | POA: Diagnosis not present

## 2021-05-19 MED ORDER — GABAPENTIN 300 MG PO CAPS: 300.0000 mg | ORAL_CAPSULE | Freq: Two times a day (BID) | ORAL | 5 refills | Status: DC

## 2021-05-19 NOTE — Progress Notes (Signed)
? ?Subjective:  ? ? Patient ID: [name], male    DOB: 1971/02/07, 51 y.o.   MRN: [id number] ? ?HPI ? ?51 year old male with history of rhabdomyolysis sciatic nerve compression as well as ulnar and median nerve compression following overdose.  He has persistent foot drop and wears AFO to ambulate.  His right upper extremity numbness and tingling have slowly resolved.  He has normal upper extremity function. ?He would like to know what other options he may have in terms of his foot drop. ?Pain Inventory ?Average Pain 4 ?Pain Right Now 4 ?My pain is tingling and aching ? ?LOCATION OF PAIN  leg ? ?BOWEL ?Number of stools per week: 7 ?Oral laxative use No  ?Type of laxative na ?Enema or suppository use No  ?History of colostomy No  ?Incontinent No  ? ?BLADDER ?Normal ?In and out cath, frequency . ?Able to self cath  . ?Bladder incontinence No  ?Frequent urination No  ?Leakage with coughing No  ?Difficulty starting stream No  ?Incomplete bladder emptying No  ? ? ?Mobility ?walk without assistance ?how many minutes can you walk? 20 ?ability to climb steps?  yes ?do you drive?  yes ? ?Function ?employed # of hrs/week 40 ? ?Neuro/Psych ?No problems in this area ? ?Prior Studies ?Any changes since last visit?  no ? ?Physicians involved in your care ?Any changes since last visit?  no ? ? ?Family History  ?Problem Relation Age of Onset  ? Colon polyps Father   ? Multiple sclerosis Sister   ? Colon cancer Neg Hx   ? Esophageal cancer Neg Hx   ? Stomach cancer Neg Hx   ? Pancreatic cancer Neg Hx   ? Liver disease Neg Hx   ? ?Social History  ? ?Socioeconomic History  ? Marital status: Married  ?  Spouse name: Not on file  ? Number of children: 1  ? Years of education: Not on file  ? Highest education level: Not on file  ?Occupational History  ? Not on file  ?Tobacco Use  ? Smoking status: Every Day  ?  Packs/day: 0.50  ?  Types: Cigarettes  ? Smokeless tobacco: Never  ?Vaping Use  ? Vaping Use: Never used  ?Substance and  Sexual Activity  ? Alcohol use: Yes  ?  Comment: 1 per day  ? Drug use: No  ? Sexual activity: Yes  ?Other Topics Concern  ? Not on file  ?Social History Narrative  ? Not on file  ? ?Social Determinants of Health  ? ?Financial Resource Strain: Not on file  ?Food Insecurity: Not on file  ?Transportation Needs: Not on file  ?Physical Activity: Not on file  ?Stress: Not on file  ?Social Connections: Not on file  ? ?Past Surgical History:  ?Procedure Laterality Date  ? COLOSTOMY  March 2011  ? Colostomy reversal  January 2012  ? HIP SURGERY  R hip surgery   ? IR FLUORO GUIDE CV LINE RIGHT  10/30/2019  ? IR US GUIDE VASC ACCESS RIGHT  10/30/2019  ? ?Past Medical History:  ?Diagnosis Date  ? Allergy   ? Crohn's colitis ([place])   ? Depression   ? Pulmonary embolism ([place])   ? Stroke [facility])   ? ?BP 117/83   Pulse (!) 124   Ht 5' 11"  (1.803 m)   Wt 225 lb (102.1 kg)   SpO2 97%   BMI 31.38 kg/m?  ? ?Opioid Risk Score:   ?Fall Risk Score:  `1 ? ?  Depression screen PHQ 2/9 ? ? ?  11/13/2020  ? 12:56 PM 04/08/2020  ?  2:49 PM 01/24/2020  ?  3:01 PM 12/11/2019  ?  2:55 PM  ?Depression screen PHQ 2/9  ?Decreased Interest 0 0 0 1  ?Down, Depressed, Hopeless 0 0 0 1  ?PHQ - 2 Score 0 0 0 2  ?Altered sleeping    1  ?Tired, decreased energy    1  ?Change in appetite    1  ?Feeling bad or failure about yourself     1  ?Trouble concentrating    0  ?Moving slowly or fidgety/restless    1  ?Suicidal thoughts    0  ?PHQ-9 Score    7  ?  ?Family History  ?Problem Relation Age of Onset  ? Colon polyps Father   ? Multiple sclerosis Sister   ? Colon cancer Neg Hx   ? Esophageal cancer Neg Hx   ? Stomach cancer Neg Hx   ? Pancreatic cancer Neg Hx   ? Liver disease Neg Hx   ? ?Social History  ? ?Socioeconomic History  ? Marital status: Married  ?  Spouse name: Not on file  ? Number of children: 1  ? Years of education: Not on file  ? Highest education level: Not on file  ?Occupational History  ? Not on file  ?Tobacco Use  ? Smoking status: Every Day  ?   Packs/day: 0.50  ?  Types: Cigarettes  ? Smokeless tobacco: Never  ?Vaping Use  ? Vaping Use: Never used  ?Substance and Sexual Activity  ? Alcohol use: Yes  ?  Comment: 1 per day  ? Drug use: No  ? Sexual activity: Yes  ?Other Topics Concern  ? Not on file  ?Social History Narrative  ? Not on file  ? ?Social Determinants of Health  ? ?Financial Resource Strain: Not on file  ?Food Insecurity: Not on file  ?Transportation Needs: Not on file  ?Physical Activity: Not on file  ?Stress: Not on file  ?Social Connections: Not on file  ? ?Past Surgical History:  ?Procedure Laterality Date  ? COLOSTOMY  March 2011  ? Colostomy reversal  January 2012  ? HIP SURGERY  R hip surgery   ? IR FLUORO GUIDE CV LINE RIGHT  10/30/2019  ? IR US GUIDE VASC ACCESS RIGHT  10/30/2019  ? ?Past Surgical History:  ?Procedure Laterality Date  ? COLOSTOMY  March 2011  ? Colostomy reversal  January 2012  ? HIP SURGERY  R hip surgery   ? IR FLUORO GUIDE CV LINE RIGHT  10/30/2019  ? IR US GUIDE VASC ACCESS RIGHT  10/30/2019  ? ?Past Medical History:  ?Diagnosis Date  ? Allergy   ? Crohn's colitis ([place])   ? Depression   ? Pulmonary embolism ([place])   ? Stroke [facility])   ? ?There were no vitals taken for this visit. ? ?Opioid Risk Score:   ?Fall Risk Score:  `1 ? ?Depression screen PHQ 2/9 ? ? ?  11/13/2020  ? 12:56 PM 04/08/2020  ?  2:49 PM 01/24/2020  ?  3:01 PM 12/11/2019  ?  2:55 PM  ?Depression screen PHQ 2/9  ?Decreased Interest 0 0 0 1  ?Down, Depressed, Hopeless 0 0 0 1  ?PHQ - 2 Score 0 0 0 2  ?Altered sleeping    1  ?Tired, decreased energy    1  ?Change in appetite    1  ?Feeling bad or  failure about yourself     1  ?Trouble concentrating    0  ?Moving slowly or fidgety/restless    1  ?Suicidal thoughts    0  ?PHQ-9 Score    7  ?  ? ? ?Review of Systems  ?Musculoskeletal:   ?     Leg pain  ?All other systems reviewed and are negative. ? ?   ?Objective:  ? Physical Exam ?Vitals and nursing note reviewed.  ?Constitutional:   ?   Appearance: He is obese.   ?HENT:  ?   Head: Normocephalic and atraumatic.  ?Eyes:  ?   Extraocular Movements: Extraocular movements intact.  ?   Conjunctiva/sclera: Conjunctivae normal.  ?   Pupils: Pupils are equal, round, and reactive to light.  ?Neurological:  ?   Mental Status: He is alert and oriented to person, place, and time.  ?   Comments: Reduced light touch sensation dorsum of right foot. ?3 - ankle dorsiflexion with tendency towards inversion, 0 foot eversion ?3/5 in the ankle plantar flexors and toe flexors. ? ?Sensation intact light touch right hand normal strength in the finger flexors finger extensors wrist flexors and extensors as well as elbow flexors and extensors shoulder ? ? ? ?  ?Psychiatric:     ?   Mood and Affect: Mood normal.     ?   Behavior: Behavior normal.  ? ? ?Ambulates with right AFO able to clear foot.  No knee instability ? ? ?   ?Assessment & Plan:  ? ?1.  Foot drop with sciatic nerve injury mainly right fibular nerve branch has made some improvement over the last 6 months.  He does have antigravity ankle dorsiflexor he is approximately 1-1/2 years post injury and at this point should be starting to plateau in terms of recovery.  We will make referral to orthopedic foot surgery to see if he would be a candidate for SPL ATT procedure or some other tendon transfer.  As I discussed with patient he may not have sufficient strength in the ankle dorsiflexor to perform this.  We will hold off on repeat EMG unless Ortho requests ? ?Still has some neuropathic pain we will continue gabapentin 300 mg twice daily ?

## 2021-06-11 ENCOUNTER — Encounter: Payer: Self-pay | Admitting: Orthopedic Surgery

## 2021-06-11 ENCOUNTER — Ambulatory Visit (INDEPENDENT_AMBULATORY_CARE_PROVIDER_SITE_OTHER): Payer: 59 | Admitting: Orthopedic Surgery

## 2021-06-11 DIAGNOSIS — M21371 Foot drop, right foot: Secondary | ICD-10-CM | POA: Diagnosis not present

## 2021-06-11 NOTE — Progress Notes (Signed)
? ?Office Visit Note ?  ?Patient: [name]           ?Date of Birth: Jun 21, 1970           ?MRN: [id number] ?Visit Date: 06/11/2021 ?             ?Requested by: Charlett Blake, MD ?[address] ?[place],  [place] [address] ?PCP: Lavone Orn, MD ? ?Chief Complaint  ?Patient presents with  ? Right Foot - Follow-up  ? ? ? ? ?HPI: ?Patient is a 51 year old gentleman who is seen for initial evaluation for acquired foot drop on the right.  Patient states the sciatic nerve injury is secondary to a stroke.  Patient currently has a static posterior AFO and wants to know if he is a candidate for tendon transfer. ? ?Assessment & Plan: ?Visit Diagnoses:  ?1. Foot drop, right   ? ? ?Plan: With the global weakness of the tendons around the ankle.  Do not feel patient would have enough dorsiflexion strength with transferring a flexor tendon to an extensor tendon.  Patient is given a prescription for a dynamic AFO. ? ?Follow-Up Instructions: Return if symptoms worsen or fail to improve.  ? ?Ortho Exam ? ?Patient is alert, oriented, no adenopathy, well-dressed, normal affect, normal respiratory effort. ?Examination patient has a fixed equinus contracture of about 10 degrees.  His peroneal tendons are weak about 4/5 the posterior tibial tendon has better strength 5/5, patient has absent anterior tibial tendon and toe extensors. ? ?Imaging: ?No results found. ?No images are attached to the encounter. ? ?Labs: ?Lab Results  ?Component Value Date  ? HGBA1C 5.1 10/28/2019  ? ESRSEDRATE 38 (H) 06/28/2009  ? REPTSTATUS 11/02/2019 FINAL 10/27/2019  ? GRAMSTAIN  03/28/2010  ?  RARE WBC PRESENT, PREDOMINANTLY PMN ?NO SQUAMOUS EPITHELIAL CELLS SEEN ?NO ORGANISMS SEEN  ? CULT  10/27/2019  ?  NO GROWTH 5 DAYS ?Performed at [facility], [place] [address], [place], [place] [address] ?  ? LABORGA ENTEROBACTER AEROGENES 03/28/2010  ? ? ? ?Lab Results  ?Component Value Date  ? ALBUMIN 4.4 03/28/2020  ? ALBUMIN 2.5 (L)  11/21/2019  ? ALBUMIN 2.3 (L) 11/19/2019  ? PREALBUMIN 5.2 (L) 05/15/2009  ? PREALBUMIN 3.3 (L) 05/11/2009  ? ? ?Lab Results  ?Component Value Date  ? MG 2.1 11/07/2019  ? MG 2.1 11/06/2019  ? MG 2.3 11/05/2019  ? ?Lab Results  ?Component Value Date  ? VD25OH 15.04 (L) 04/14/2021  ? VD25OH 8.79 (L) 11/14/2019  ? ? ?Lab Results  ?Component Value Date  ? PREALBUMIN 5.2 (L) 05/15/2009  ? PREALBUMIN 3.3 (L) 05/11/2009  ? ? ?  Latest Ref Rng & Units 03/28/2020  ?  4:06 PM 11/21/2019  ? 12:26 PM 11/19/2019  ?  4:18 PM  ?CBC EXTENDED  ?WBC 4.0 - 10.5 K/uL 10.0   6.4   6.5    ?RBC 4.22 - 5.81 Mil/uL 4.17   2.38   2.36    ?Hemoglobin 13.0 - 17.0 g/dL 12.3   6.9   7.0    ?HCT 39.0 - 52.0 % 35.8   22.5   22.5    ?Platelets 150.0 - 400.0 K/uL 248.0   312   310    ?NEUT# 1.4 - 7.7 K/uL 7.6      ?Lymph# 0.7 - 4.0 K/uL 1.7      ? ? ? ?There is no height or weight on file to calculate BMI. ? ?Orders:  ?No  orders of the defined types were placed in this encounter. ? ?No orders of the defined types were placed in this encounter. ? ? ? Procedures: ?No procedures performed ? ?Clinical Data: ?No additional findings. ? ?ROS: ? ?All other systems negative, except as noted in the HPI. ?Review of Systems ? ?Objective: ?Vital Signs: There were no vitals taken for this visit. ? ?Specialty Comments:  ?No specialty comments available. ? ?PMFS History: ?Patient Active Problem List  ? Diagnosis Date Noted  ? Neuropathy of right sciatic nerve 11/13/2020  ? 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 Anemia of chronic disease 11/22/2019  ? Neuropathy 11/22/2019  ? Embolic stroke ([place]) 49/17/9150  ? Anoxic brain injury ([place]) 11/09/2019  ? Rhabdomyolysis 11/09/2019  ? Acute renal failure due to rhabdomyolysis ([place]) 10/27/2019  ? Hyperkalemia 10/27/2019  ? Transaminitis 10/27/2019  ? High anion gap metabolic acidosis 56/97/9480  ? Alcohol use 10/27/2019  ? Substance use disorder 10/27/2019  ? Acute cerebral infarction ([place]) 10/27/2019  ? Hypotension  10/27/2019  ? Crohn's disease ([place]) 10/27/2019  ? Lactic acidosis 10/27/2019  ? ?Past Medical History:  ?Diagnosis Date  ? Allergy   ? Crohn's colitis ([place])   ? Depression   ? Pulmonary embolism ([place])   ? Stroke [facility])   ?  ?Family History  ?Problem Relation Age of Onset  ? Colon polyps Father   ? Multiple sclerosis Sister   ? Colon cancer Neg Hx   ? Esophageal cancer Neg Hx   ? Stomach cancer Neg Hx   ? Pancreatic cancer Neg Hx   ? Liver disease Neg Hx   ?  ?Past Surgical History:  ?Procedure Laterality Date  ? COLOSTOMY  March 2011  ? Colostomy reversal  January 2012  ? HIP SURGERY  R hip surgery   ? IR FLUORO GUIDE CV LINE RIGHT  10/30/2019  ? IR US GUIDE VASC ACCESS RIGHT  10/30/2019  ? ?Social History  ? ?Occupational History  ? Not on file  ?Tobacco Use  ? Smoking status: Every Day  ?  Packs/day: 0.50  ?  Types: Cigarettes  ? Smokeless tobacco: Never  ?Vaping Use  ? Vaping Use: Never used  ?Substance and Sexual Activity  ? Alcohol use: Yes  ?  Comment: 1 per day  ? Drug use: No  ? Sexual activity: Yes  ? ? ? ? ? ?

## 2021-06-16 ENCOUNTER — Telehealth: Payer: Self-pay

## 2021-06-16 NOTE — Telephone Encounter (Signed)
Received a call from Michie at [place]. She states that patient's new insurance is not covering [place] but they do cover Remicade. Lorie has requested that I fax over Remicade orders and latest office notes.  ? ?Written orders for Remicade 5 mg/kg every [redacted] weeks along with latest office notes/labs.  ? ?P: [phone]. [phone] ?F: [phone] ?

## 2021-06-16 NOTE — Telephone Encounter (Signed)
Okay that is fine, thanks for letting me know ?

## 2021-06-17 NOTE — Telephone Encounter (Signed)
Received a call from Steinauer at [facility] asking that you give her a call regarding this patient. ? ?Phone Number - [phone], ext. [phone] ?

## 2021-06-17 NOTE — Telephone Encounter (Signed)
Returned call to Dory Larsen, she stated that pt's insurance wanted to know if patient had tried and failed any other medication. I informed Lorie that pt has been on Remicade since 2012, I told her that he was administered Methylprednisolone several times while he was admitted in the hospital several years ago. Lorie requested that I fax over the records to her at the same fax number. Medication list has been faxed to [facility] per her request.  ?

## 2021-07-22 DIAGNOSIS — K50119 Crohn's disease of large intestine with unspecified complications: Secondary | ICD-10-CM

## 2021-07-22 DIAGNOSIS — Z79899 Other long term (current) drug therapy: Secondary | ICD-10-CM

## 2021-07-22 NOTE — Telephone Encounter (Signed)
-----   Message from Roetta Sessions, [place] sent at 04/15/2021  2:51 PM EST ----- Regarding: vitamin d in June Needs Vitamin D lab in early June

## 2021-07-22 NOTE — Telephone Encounter (Signed)
MyChart message sent to patient. Lab entered

## 2021-08-17 ENCOUNTER — Other Ambulatory Visit (INDEPENDENT_AMBULATORY_CARE_PROVIDER_SITE_OTHER): Payer: 59

## 2021-08-17 DIAGNOSIS — K50119 Crohn's disease of large intestine with unspecified complications: Secondary | ICD-10-CM | POA: Diagnosis not present

## 2021-08-17 DIAGNOSIS — Z79899 Other long term (current) drug therapy: Secondary | ICD-10-CM | POA: Diagnosis not present

## 2021-08-17 LAB — VITAMIN D 25 HYDROXY (VIT D DEFICIENCY, FRACTURES): VITD: 42.12 ng/mL (ref 30.00–100.00)

## 2021-10-27 ENCOUNTER — Encounter: Payer: Self-pay | Admitting: Gastroenterology

## 2021-11-20 ENCOUNTER — Ambulatory Visit: Payer: BC Managed Care – PPO | Admitting: Physical Medicine & Rehabilitation

## 2021-11-24 ENCOUNTER — Encounter: Payer: 59 | Admitting: Physical Medicine & Rehabilitation

## 2021-12-25 ENCOUNTER — Encounter: Payer: 59 | Admitting: Physical Medicine & Rehabilitation

## 2022-02-02 ENCOUNTER — Ambulatory Visit: Payer: 59 | Admitting: Physical Medicine & Rehabilitation

## 2022-02-05 ENCOUNTER — Encounter: Payer: 59 | Attending: Physical Medicine & Rehabilitation | Admitting: Physical Medicine & Rehabilitation

## 2022-02-05 ENCOUNTER — Encounter: Payer: Self-pay | Admitting: Physical Medicine & Rehabilitation

## 2022-02-05 VITALS — BP 138/96 | HR 124 | Ht 71.0 in | Wt 225.0 lb

## 2022-02-05 DIAGNOSIS — M21379 Foot drop, unspecified foot: Secondary | ICD-10-CM | POA: Diagnosis not present

## 2022-02-05 DIAGNOSIS — G5701 Lesion of sciatic nerve, right lower limb: Secondary | ICD-10-CM | POA: Insufficient documentation

## 2022-02-05 MED ORDER — GABAPENTIN 300 MG PO CAPS: 300.0000 mg | ORAL_CAPSULE | Freq: Two times a day (BID) | ORAL | 11 refills | Status: DC

## 2022-02-05 NOTE — Patient Instructions (Signed)
Ankle Exercises Ask your health care provider which exercises are safe for you. Do exercises exactly as told by your health care provider and adjust them as directed. It is normal to feel mild stretching, pulling, tightness, or discomfort as you do these exercises. Stop right away if you feel sudden pain or your pain gets worse. Do not begin these exercises until told by your health care provider. Stretching and range-of-motion exercises These exercises warm up your muscles and joints. They can help improve the movement and flexibility of your ankle. They may also help to relieve pain. Dorsiflexion/plantar flexion  Sit with your left / right knee straight or bent. Do not rest your foot on anything. Flex your left / right ankle to tilt the top of your foot toward your shin. This is called dorsiflexion. Hold this position for __________ seconds. Point your toes downward to tilt the top of your foot away from your shin. This is called plantar flexion. Hold this position for __________ seconds. Repeat __________ times. Complete this exercise __________ times a day. Ankle alphabet  Sit with your left / right foot supported at your lower leg. Do not rest your foot on anything. Make sure your foot has room to move freely. Think of your left / right foot as a paintbrush: Move your foot to trace each letter of the alphabet in the air. Keep your hip and knee still while you trace the letters. Make the letters as large as you can without causing or increasing any discomfort. Repeat __________ times. Complete this exercise __________ times a day. Passive ankle dorsiflexion This is an exercise in which something or someone moves your ankle for you. Sit in a chair on a non-carpeted surface. Place your left / right foot on the floor, directly under your left / right knee. Extend your left / right leg for support. Keeping your heel down, slide your left / right foot back toward the chair until you feel a  stretch at your ankle or calf. If you do not feel a stretch, slide your buttocks forward to the edge of the chair while keeping your heel down. Hold this stretch for __________ seconds. Repeat __________ times. Complete this exercise __________ times a day. Strengthening exercises These exercises build strength and endurance in your ankle. Endurance is the ability to use your muscles for a long time, even after they get tired. Dorsiflexors These are muscles that lift your foot up. Secure a rubber exercise band or tube to an object, such as a table leg, that will stay still when the band is pulled. Secure the other end around your left / right foot. Sit on the floor. Face the object with your left / right leg extended. The band or tube should be slightly tense when your foot is relaxed. Slowly flex your left / right ankle and toes to bring your foot toward your shin. Hold this position for __________ seconds. Slowly return your foot to the starting position, controlling the band as you do. Repeat __________ times. Complete this exercise __________ times a day. Plantar flexors These are muscles that push your foot down. Sit on the floor with your left / right leg extended. Loop a rubber exercise band or tube around the ball of your left / right foot. The ball of your foot is on the walking surface, right under your toes. The band or tube should be slightly tense when your foot is relaxed. Slowly point your toes downward, pushing them away from you.  Hold this position for __________ seconds. Slowly release the tension in the band or tube, controlling smoothly until your foot is back in the starting position. Repeat __________ times. Complete this exercise __________ times a day. Towel curls  Sit in a chair on a non-carpeted surface. Put your feet on the floor. Place a towel in front of your feet. If told by your health care provider, add a __________ lb / kg weight to the end of the  towel. Keeping your heel on the floor, put your left / right foot on the towel. Pull the towel toward you by grabbing the towel with your toes and curling them under. Keep your heel on the floor. Let your toes relax. Grab the towel again. Keep pulling the towel until it is completely underneath your foot. Repeat __________ times. Complete this exercise __________ times a day. Standing plantar flexion This is an exercise in which you use your toes to lift your body's weight while standing. Stand with your feet shoulder-width apart. Keep your weight spread evenly over the width of your feet while you rise up on your toes. Use a wall or table to steady yourself if needed, but try not to use it for support. If this exercise is too easy, try these options: Shift your weight toward your left / right leg until you feel challenged. If told by your health care provider, lift your uninjured leg off the floor. Hold this position for __________ seconds. Repeat __________ times. Complete this exercise __________ times a day. Tandem walking  Stand with one foot directly in front of the other. Slowly raise your back foot up, lifting your heel before your toes, and place it directly in front of your other foot. Continue to walk in this heel-to-toe way for __________ or for as long as told by your health care provider. Have a countertop or wall nearby to use if needed to keep your balance, but try not to hold onto anything for support. Repeat __________ times. Complete this exercise __________ times a day. This information is not intended to replace advice given to you by your health care provider. Make sure you discuss any questions you have with your health care provider. Document Revised: 05/19/2021 Document Reviewed: 05/19/2021 Elsevier Patient Education  [place].
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
## 2022-10-14 ENCOUNTER — Telehealth: Payer: Self-pay

## 2022-10-14 NOTE — Telephone Encounter (Signed)
Called and left detailed message for patient that he is overdue for an appointment. Asked him to call asap to get an appointment with [facility] or a PA/NP. (Dx: Crohn's).

## 2022-10-14 NOTE — Telephone Encounter (Signed)
-----   Message from Benancio Deeds sent at 10/13/2022  4:41 PM EDT ----- Regarding: follow up appointment Jan can you help or send to the schedulers for routine office appointment for this patient?  He is overdue.  Thanks

## 2022-11-02 ENCOUNTER — Encounter: Payer: Self-pay | Admitting: Physical Medicine & Rehabilitation

## 2022-11-02 ENCOUNTER — Encounter
Payer: BC Managed Care – PPO | Attending: Physical Medicine & Rehabilitation | Admitting: Physical Medicine & Rehabilitation

## 2022-11-02 VITALS — BP 125/83 | HR 92 | Ht 71.0 in | Wt 238.0 lb

## 2022-11-02 DIAGNOSIS — M21379 Foot drop, unspecified foot: Secondary | ICD-10-CM | POA: Diagnosis present

## 2022-11-02 NOTE — Progress Notes (Signed)
Subjective:    Patient ID: [name], male    DOB: [birth year], 52 y.o.   MRN: [id number]  HPI  52 year old male with a prolonged unconscious episode resulting in right distal sciatic nerve palsy which occurred approximately 3 years ago.  He completed inpatient rehabilitation as well as outpatient rehabilitation.  He initially had severe rhabdomyolysis and kidney failure but was able to come off of hemodialysis. He continues to walk with an AFO.  He feels like he may have regained a little bit of sensation on his right foot. He has not noted much difference in his right foot or toe strength. Pain Inventory Average Pain 4 Pain Right Now 4 My pain is stabbing, tingling, and aching  In the last 24 hours, has pain interfered with the following? General activity 1 Relation with others 1 Enjoyment of life 1 What TIME of day is your pain at its worst? morning  Sleep (in general) Fair  Pain is worse with: walking, standing, and some activites Pain improves with: rest Relief from Meds:  .  Family History  Problem Relation Age of Onset   Colon polyps Father    Multiple sclerosis Sister    Colon cancer Neg Hx    Esophageal cancer Neg Hx    Stomach cancer Neg Hx    Pancreatic cancer Neg Hx    Liver disease Neg Hx    Social History   Socioeconomic History   Marital status: Married    Spouse name: Not on file   Number of children: 1   Years of education: Not on file   Highest education level: Not on file  Occupational History   Not on file  Tobacco Use   Smoking status: Every Day    Current packs/day: 0.50    Types: Cigarettes   Smokeless tobacco: Never  Vaping Use   Vaping status: Never Used  Substance and Sexual Activity   Alcohol use: Yes    Comment: 1 per day   Drug use: No   Sexual activity: Yes  Other Topics Concern   Not on file  Social History Narrative   Not on file   Social Determinants of Health   Financial Resource Strain: Not on file  Food  Insecurity: Not on file  Transportation Needs: Not on file  Physical Activity: Not on file  Stress: Not on file  Social Connections: Not on file   Past Surgical History:  Procedure Laterality Date   COLOSTOMY  March 2011   Colostomy reversal  January 2012   HIP SURGERY  R hip surgery    IR FLUORO GUIDE CV LINE RIGHT  10/30/2019   IR US GUIDE VASC ACCESS RIGHT  10/30/2019   Past Surgical History:  Procedure Laterality Date   COLOSTOMY  March 2011   Colostomy reversal  January 2012   HIP SURGERY  R hip surgery    IR FLUORO GUIDE CV LINE RIGHT  10/30/2019   IR US GUIDE VASC ACCESS RIGHT  10/30/2019   Past Medical History:  Diagnosis Date   Allergy    Crohn's colitis (HCC)    Depression    Pulmonary embolism (HCC)    Stroke (HCC)    Ht 5\' 11"  (1.803 m)   Wt 238 lb (108 kg)   BMI 33.19 kg/m   Opioid Risk Score:   Fall Risk Score:  `1  Depression screen [facility] 2/9     11/02/2022    3:33 PM 02/05/2022    2:07 PM  05/19/2021    3:24 PM 11/13/2020   12:56 PM 04/08/2020    2:49 PM 01/24/2020    3:01 PM 12/11/2019    2:55 PM  Depression screen PHQ 2/9  Decreased Interest 0 0 0 0 0 0 1  Down, Depressed, Hopeless 0 0 0 0 0 0 1  PHQ - 2 Score 0 0 0 0 0 0 2  Altered sleeping       1  Tired, decreased energy       1  Change in appetite       1  Feeling bad or failure about yourself        1  Trouble concentrating       0  Moving slowly or fidgety/restless       1  Suicidal thoughts       0  PHQ-9 Score       7      Review of Systems  Musculoskeletal:  Positive for gait problem.  All other systems reviewed and are negative.     Objective:   Physical Exam Constitutional:      Appearance: He is obese.  HENT:     Head: Normocephalic and atraumatic.  Eyes:     Extraocular Movements: Extraocular movements intact.     Conjunctiva/sclera: Conjunctivae normal.     Pupils: Pupils are equal, round, and reactive to light.  Neurological:     Mental Status: He is alert and oriented  to person, place, and time.     Gait: Gait abnormal.     Comments: Right foot has absent sensation to pinprick in all the toes on both the dorsal and plantar aspect.  He has absent sensation below the ankle except over the right medial malleolus. He does have 3 - toe flexion but 0 toe extension trace ankle dorsiflexion 3 - ankle plantarflexion 3 - foot inversion eversion 5/5 strength in hip flexor knee extensor Ambulates with AFO he does have compensatory hip hiking to clear toes on the right side  Psychiatric:        Mood and Affect: Mood normal.        Behavior: Behavior normal.    Right ankle will get to 5 degrees of dorsiflexion passively.       Assessment & Plan:   1.  Right distal sciatic nerve injury approximately 3 years post.  Do not expect any further neurologic improvement.  Has an acquired foot drop.  He would benefit from an hinged AFO because he is quite active and has some plantarflexion

## 2022-11-05 ENCOUNTER — Ambulatory Visit: Payer: 59 | Admitting: Physical Medicine & Rehabilitation

## 2023-02-03 ENCOUNTER — Encounter: Payer: BC Managed Care – PPO | Admitting: Physical Medicine & Rehabilitation

## 2023-02-19 ENCOUNTER — Other Ambulatory Visit: Payer: Self-pay | Admitting: Physical Medicine & Rehabilitation

## 2023-03-18 ENCOUNTER — Telehealth: Payer: Self-pay | Admitting: Gastroenterology

## 2023-03-18 DIAGNOSIS — K50119 Crohn's disease of large intestine with unspecified complications: Secondary | ICD-10-CM

## 2023-03-18 MED ORDER — INFLIXIMAB 100 MG IV SOLR: 5.0000 mg/kg | INTRAVENOUS | Status: AC

## 2023-03-18 MED ORDER — AVSOLA 100 MG IV SOLR: 5.0000 mg/kg | INTRAVENOUS | 6 refills | Status: DC

## 2023-03-18 NOTE — Telephone Encounter (Addendum)
Called and spoke with patient to schedule routine office visit since he is overdue. F/u scheduled for 04/28/23 at 9 am with Dr. Adela Lank.   Remicade 5mg /kg order printed and faxed to [facility] as requested.

## 2023-03-18 NOTE — Addendum Note (Signed)
Addended by: Marisa Sprinkles on: 03/18/2023 01:46 PM   Modules accepted: Orders

## 2023-03-18 NOTE — Telephone Encounter (Signed)
Inbound call from [facility] requesting an updated order for Remicade to be faxed to [phone].  Called back number is [phone]. Please advise, thank you.

## 2023-03-22 LAB — LAB REPORT - SCANNED: EGFR: 65

## 2023-04-28 ENCOUNTER — Encounter: Payer: Self-pay | Admitting: Gastroenterology

## 2023-04-28 ENCOUNTER — Ambulatory Visit: Payer: BC Managed Care – PPO | Admitting: Gastroenterology

## 2023-04-28 ENCOUNTER — Other Ambulatory Visit (INDEPENDENT_AMBULATORY_CARE_PROVIDER_SITE_OTHER)

## 2023-04-28 VITALS — BP 130/80 | HR 113 | Wt 231.0 lb

## 2023-04-28 DIAGNOSIS — K50119 Crohn's disease of large intestine with unspecified complications: Secondary | ICD-10-CM

## 2023-04-28 DIAGNOSIS — Z79899 Other long term (current) drug therapy: Secondary | ICD-10-CM

## 2023-04-28 DIAGNOSIS — E559 Vitamin D deficiency, unspecified: Secondary | ICD-10-CM

## 2023-04-28 DIAGNOSIS — Z860100 Personal history of colon polyps, unspecified: Secondary | ICD-10-CM | POA: Diagnosis not present

## 2023-04-28 LAB — VITAMIN D 25 HYDROXY (VIT D DEFICIENCY, FRACTURES): VITD: 19.93 ng/mL — ABNORMAL LOW (ref 30.00–100.00)

## 2023-04-28 MED ORDER — SUFLAVE 178.7 G PO SOLR: 1.0000 | Freq: Once | ORAL | 0 refills | Status: DC

## 2023-04-28 MED ORDER — SUTAB 1479-225-188 MG PO TABS: ORAL_TABLET | ORAL | 0 refills | Status: AC

## 2023-04-28 MED ORDER — ZOSTER VAC RECOMB ADJUVANTED 50 MCG/0.5ML IM SUSR: INTRAMUSCULAR | 1 refills | Status: AC

## 2023-04-28 NOTE — Patient Instructions (Addendum)
 You have been scheduled for a colonoscopy. Please follow written instructions given to you at your visit today.   [facility] has implemented a new process for scheduling procedures.  Please note your arrival time for the [facility] is the appointment time that is shown on your written instructions.  Please do not arrive one hour prior to the time listed in your instructions.  Please ignore any outside notifications to arrive one hour early.  We apologize for any confusion and look forward to seeing you for your procedure.   If you use inhalers (even only as needed), please bring them with you on the day of your procedure.  DO NOT TAKE 7 DAYS PRIOR TO TEST- Trulicity (dulaglutide) Ozempic, Wegovy (semaglutide) Mounjaro (tirzepatide) Bydureon Bcise (exanatide extended release)  DO NOT TAKE 1 DAY PRIOR TO YOUR TEST Rybelsus (semaglutide) Adlyxin (lixisenatide) Victoza (liraglutide) Byetta (exanatide) __________________________________________________ Your Provider Has Sent Your Bowel Prep Regimen To [facility]   [facility] will contact you to verify your information and collect your copay, if applicable. Enjoy the comfort of your home while your prescription is mailed to you, FREE of any shipping charges.   [facility] accepts all major insurance benefits and applies discounts & coupons.  Have additional questions?   Chat: [email or web address] (fastest way to get a hold of them) Call: [phone] Email: [email or web address] NCPDP: [id number]  How will [facility] contact you?  With a Welcome phone call,  a Welcome text and a checkout link in text form.  Texts you receive from [phone] Are NOT Spam.  *To set up delivery, you must complete the checkout process via link or speak to one of the patient care representatives. If [facility] is unable to reach you, your prescription may be delayed.   Please go to the lab in the basement of our building to have  lab work done as you leave today. Hit "B" for basement when you get on the elevator.  When the doors open the lab is on your left.  We will call you with the results. Thank you.   We are giving you an order for the Shingrix vaccine that you can take to your local pharmacy.   Thank you for entrusting me with your care and for choosing [facility], Dr. Ileene Cataldo    If your blood pressure at your visit was 140/90 or greater, please contact your primary care physician to follow up on this. ______________________________________________________  If you are age 52 or older, your body mass index should be between 23-30. Your Body mass index is 32.22 kg/m. If this is out of the aforementioned range listed, please consider follow up with your Primary Care Provider.  If you are age 52 or younger, your body mass index should be between 19-25. Your Body mass index is 32.22 kg/m. If this is out of the aformentioned range listed, please consider follow up with your Primary Care Provider.  ________________________________________________________  The [facility] providers would like to encourage you to use [facility] to communicate with providers for non-urgent requests or questions.  Due to long hold times on the telephone, sending your provider a message by [facility] may be a faster and more efficient way to get a response.  Please allow 48 business hours for a response.  Please remember that this is for non-urgent requests.  _______________________________________________________  Due to recent changes in healthcare laws, you may see the results of your imaging and laboratory studies on MyChart before your provider has had a  chance to review them.  We understand that in some cases there may be results that are confusing or concerning to you. Not all laboratory results come back in the same time frame and the provider may be waiting for multiple results in order to interpret others.  Please give  [place] 48 hours in order for your provider to thoroughly review all the results before contacting the office for clarification of your results.
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
## 2023-05-01 LAB — QUANTIFERON-TB GOLD PLUS
Mitogen-NIL: >10 [IU]/mL
NIL: 0.06 [IU]/mL
QuantiFERON-TB Gold Plus: NEGATIVE
TB1-NIL: 0.01 [IU]/mL
TB2-NIL: <0 [IU]/mL

## 2023-06-23 ENCOUNTER — Ambulatory Visit: Admitting: Gastroenterology

## 2023-06-23 ENCOUNTER — Encounter: Payer: Self-pay | Admitting: Gastroenterology

## 2023-06-23 VITALS — BP 133/83 | HR 85 | Temp 98.0°F | Resp 15 | Ht 71.0 in | Wt 231.0 lb

## 2023-06-23 DIAGNOSIS — Z1211 Encounter for screening for malignant neoplasm of colon: Secondary | ICD-10-CM

## 2023-06-23 DIAGNOSIS — D120 Benign neoplasm of cecum: Secondary | ICD-10-CM | POA: Diagnosis not present

## 2023-06-23 DIAGNOSIS — K635 Polyp of colon: Secondary | ICD-10-CM

## 2023-06-23 DIAGNOSIS — D125 Benign neoplasm of sigmoid colon: Secondary | ICD-10-CM

## 2023-06-23 DIAGNOSIS — K648 Other hemorrhoids: Secondary | ICD-10-CM | POA: Diagnosis not present

## 2023-06-23 DIAGNOSIS — K50119 Crohn's disease of large intestine with unspecified complications: Secondary | ICD-10-CM

## 2023-06-23 DIAGNOSIS — K6289 Other specified diseases of anus and rectum: Secondary | ICD-10-CM | POA: Diagnosis not present

## 2023-06-23 DIAGNOSIS — K5730 Diverticulosis of large intestine without perforation or abscess without bleeding: Secondary | ICD-10-CM | POA: Diagnosis not present

## 2023-06-23 DIAGNOSIS — Z860100 Personal history of colon polyps, unspecified: Secondary | ICD-10-CM

## 2023-06-23 MED ORDER — SODIUM CHLORIDE 0.9 % IV SOLN: 500.0000 mL | Freq: Once | INTRAVENOUS | Status: DC

## 2023-06-23 NOTE — Progress Notes (Signed)
 Vss nad trans to pacu

## 2023-06-23 NOTE — Progress Notes (Signed)
 [facility] History and Physical   Primary Care Physician:  Danny Saunas, MD (Inactive)   Reason for Procedure:   Crohn's disease, history of colon polyps  Plan:    colonoscopy     HPI: [name] is a 53 y.o. male  here for colonoscopy surveillance - on Remicade  for Crohn's colitis, pseudopolyposis. Last exam in 2021. Doing well on Remicade .     . Patient denies any bowel symptoms at this time.  Otherwise feels well without any cardiopulmonary symptoms.   I have discussed risks / benefits of anesthesia and endoscopic procedure with [name] and they wish to proceed with the exams as outlined today.    Past Medical History:  Diagnosis Date   Allergy    Crohn's colitis (HCC)    Depression    Pulmonary embolism (HCC)    Stroke [facility])    Vitamin D  deficiency     Past Surgical History:  Procedure Laterality Date   COLOSTOMY  March 2011   Colostomy reversal  January 2012   HIP SURGERY  R hip surgery    IR FLUORO GUIDE CV LINE RIGHT  10/30/2019   IR US  GUIDE VASC ACCESS RIGHT  10/30/2019    Prior to Admission medications   Medication Sig Start Date End Date Taking? Authorizing Provider  acetaminophen  (TYLENOL ) 500 MG tablet Take 1 tablet by mouth every 6 (six) hours as needed.   Yes [provider]  albuterol  (VENTOLIN  HFA) 108 (90 Base) MCG/ACT inhaler Inhale into the lungs. 05/28/23  Yes [provider]  gabapentin  (NEURONTIN ) 300 MG capsule TAKE 1 CAPSULE BY MOUTH TWICE A DAY 02/22/23  Yes Kirsteins, Cecilia Coe, MD  inFLIXimab  (REMICADE ) 100 MG injection Inject into the vein. 03/25/17  Yes [provider]  sertraline (ZOLOFT) 50 MG tablet Take 1 mg by mouth. 07/13/19  Yes [provider]  sildenafil (VIAGRA) 50 MG tablet Take by mouth. 08/14/16  Yes [provider]  acetaminophen  (TYLENOL ) 325 MG tablet 2 tablet as needed Orally prior to Remicade  treatment Patient not taking: Reported on 06/23/2023    [provider]  inFLIXimab  (REMICADE ) 100 MG injection Inject 500 mg into the vein every 8 (eight) weeks. 03/18/23   Danny Sarver, Lendon Queen, MD  loratadine  (CLARITIN ) 10 MG tablet Take 10 mg by mouth as needed for allergies.    [provider]  loratadine  (CLARITIN ) 10 MG tablet 1 tablet Orally Prior to Remicade  treatment for 30 day(s)    [provider]  Sodium Sulfate-Mag Sulfate-KCl (SUTAB ) [id number] MG TABS Use as directed for colonoscopy. MANUFACTURER CODES!! BIN: [id number] PCN: CN GROUP: [id number] MEMBER ID: [id number];VHQ AS SECONDARY INSURANCE ;NO PRIOR AUTHORIZATION Patient not taking: Reported on 06/23/2023 04/28/23   Danny Ruderman, Lendon Queen, MD  Zoster Vaccine Adjuvanted [facility]) injection Please take this order to pharmacy to get the Shingrix vaccine series 04/28/23   Danny Hur, Lendon Queen, MD    Current Outpatient Medications  Medication Sig Dispense Refill   acetaminophen  (TYLENOL ) 500 MG tablet Take 1 tablet by mouth every 6 (six) hours as needed.     albuterol  (VENTOLIN  HFA) 108 (90 Base) MCG/ACT inhaler Inhale into the lungs.     gabapentin  (NEURONTIN ) 300 MG capsule TAKE 1 CAPSULE BY MOUTH TWICE A DAY 60 capsule 11   inFLIXimab  (REMICADE ) 100 MG injection Inject into the vein.     sertraline (ZOLOFT) 50 MG tablet Take 1 mg by mouth.     sildenafil (VIAGRA) 50 MG tablet Take by  mouth.     acetaminophen  (TYLENOL ) 325 MG tablet 2 tablet as needed Orally prior to Remicade  treatment (Patient not taking: Reported on 06/23/2023)     inFLIXimab  (REMICADE ) 100 MG injection Inject 500 mg into the vein every 8 (eight) weeks.     loratadine  (CLARITIN ) 10 MG tablet Take 10 mg by mouth as needed for allergies.     loratadine  (CLARITIN ) 10 MG tablet 1 tablet Orally Prior to Remicade  treatment for 30 day(s)     Sodium Sulfate-Mag Sulfate-KCl (SUTAB ) 1479-225-188 MG TABS Use as directed for colonoscopy. MANUFACTURER CODES!! BIN: [id number] PCN: CN GROUP: [id number] MEMBER ID: [id number];GNF  AS SECONDARY INSURANCE ;NO PRIOR AUTHORIZATION (Patient not taking: Reported on 06/23/2023) 24 tablet 0   Zoster Vaccine Adjuvanted [facility]) injection Please take this order to pharmacy to get the Shingrix vaccine series 0.5 mL 1   Current Facility-Administered Medications  Medication Dose Route Frequency Provider Last Rate Last Admin   0.9 %  sodium chloride  infusion  500 mL Intravenous Once Danny Bowdish, Lendon Queen, MD       Facility-Administered Medications Ordered in Other Visits  Medication Dose Route Frequency Provider Last Rate Last Admin   loratadine  (CLARITIN ) tablet 10 mg  10 mg Oral Daily Johnson, Martin K, MD        Allergies as of 06/23/2023   (No Known Allergies)    Family History  Problem Relation Age of Onset   Colon polyps Father    Multiple sclerosis Sister    Colon cancer Neg Hx    Esophageal cancer Neg Hx    Stomach cancer Neg Hx    Pancreatic cancer Neg Hx    Liver disease Neg Hx     Social History   Socioeconomic History   Marital status: Married    Spouse name: Not on file   Number of children: 1   Years of education: Not on file   Highest education level: Not on file  Occupational History   Not on file  Tobacco Use   Smoking status: Every Day    Current packs/day: 0.50    Types: Cigarettes   Smokeless tobacco: Never  Vaping Use   Vaping status: Never Used  Substance and Sexual Activity   Alcohol use: Yes    Comment: socially   Drug use: No   Sexual activity: Yes  Other Topics Concern   Not on file  Social History Narrative   Not on file   Social Drivers of Health   Financial Resource Strain: Not on file  Food Insecurity: Not on file  Transportation Needs: Not on file  Physical Activity: Not on file  Stress: Not on file  Social Connections: Not on file  Intimate Partner Violence: Not on file    Review of Systems: All other review of systems negative except as mentioned in the HPI.  Physical Exam: Vital signs BP (!) 137/91    Pulse 77   Temp 98 F (36.7 C) (Temporal)   Resp 17   Ht 5\' 11"  (1.803 m)   Wt 231 lb (104.8 kg)   SpO2 95%   BMI 32.22 kg/m   General:   Alert,  Well-developed, pleasant and cooperative in NAD Lungs:  Clear throughout to auscultation.   Heart:  Regular rate and rhythm Abdomen:  Soft, nontender and nondistended.   Neuro/Psych:  Alert and cooperative. Normal mood and affect. A and O x 3  Christi Coward, MD [facility]

## 2023-06-23 NOTE — Op Note (Signed)
 [facility] Patient Name: [name] Procedure Date: 06/23/2023 9:09 AM MRN: [id number] Endoscopist: Landon Pinion P. General [place] , MD, [id number] Age: 53 Referring MD:  Date of Birth: 1970/08/24 Gender: Male Account #: [id number] Procedure:                Colonoscopy Indications:              For surveillance of Crohn's disease of the small                            bowel and colon - history of pseudopolyposis. Well                            controlled on Remicade . Medicines:                Monitored Anesthesia Care Procedure:                Pre-Anesthesia Assessment:                           - Prior to the procedure, a History and Physical                            was performed, and patient medications and                            allergies were reviewed. The patient's tolerance of                            previous anesthesia was also reviewed. The risks                            and benefits of the procedure and the sedation                            options and risks were discussed with the patient.                            All questions were answered, and informed consent                            was obtained. Prior Anticoagulants: The patient has                            taken no anticoagulant or antiplatelet agents. ASA                            Grade Assessment: II - A patient with mild systemic                            disease. After reviewing the risks and benefits,                            the patient was deemed in satisfactory condition to  undergo the procedure.                           After obtaining informed consent, the colonoscope                            was passed under direct vision. Throughout the                            procedure, the patient's blood pressure, pulse, and                            oxygen saturations were monitored continuously. The                            CF [id number] #[id number] was  introduced through the anus                            and advanced to the the terminal ileum, with                            identification of the appendiceal orifice and IC                            valve. The colonoscopy was performed without                            difficulty. The patient tolerated the procedure                            well. The quality of the bowel preparation was                            good. The terminal ileum, ileocecal valve,                            appendiceal orifice, and rectum were photographed. Scope In: 9:17:49 AM Scope Out: 9:32:18 AM Scope Withdrawal Time: 0 hours 12 minutes 52 seconds  Total Procedure Duration: 0 hours 14 minutes 29 seconds  Findings:                 The perianal and digital rectal examinations were                            normal.                           The terminal ileum appeared normal.                           A diminutive polyp was found in the cecum. The                            polyp was sessile. The polyp was removed with a  cold snare. Resection and retrieval were complete.                           Multiple diverticula were found in the transverse                            colon and left colon.                           A 4 mm polyp was found in the sigmoid colon. The                            polyp was sessile. The polyp was removed with a                            cold snare. Resection and retrieval were complete.                           Pseudopolyps were found in the sigmoid colon.                           Anal papilla(e) were hypertrophied.                           Internal hemorrhoids were found during retroflexion.                           The exam was otherwise without abnormality. No                            active inflammation                           Biopsies for histology were taken with a cold                            forceps from the right colon, left  colon and                            transverse colon for surveillance. Complications:            No immediate complications. Estimated blood loss:                            Minimal. Estimated Blood Loss:     Estimated blood loss was minimal. Impression:               - The examined portion of the ileum was normal.                           - One diminutive polyp in the cecum, removed with a                            cold snare. Resected and retrieved.                           -  Diverticulosis in the transverse colon and in the                            left colon.                           - One 4 mm polyp in the sigmoid colon, removed with                            a cold snare. Resected and retrieved.                           - Pseudopolyps in the sigmoid colon.                           - Anal papilla(e) were hypertrophied.                           - Internal hemorrhoids.                           - The examination was otherwise normal.                           - Biopsies were taken with a cold forceps from the                            right colon, left colon and transverse colon for                            surveillance. Recommendation:           - Patient has a contact number available for                            emergencies. The signs and symptoms of potential                            delayed complications were discussed with the                            patient. Return to normal activities tomorrow.                            Written discharge instructions were provided to the                            patient.                           - Resume previous diet.                           - Continue present medications.                           - Await pathology  results. Landon Pinion P. Danny Blecher, MD 06/23/2023 9:38:07 AM This report has been signed electronically.

## 2023-06-23 NOTE — Progress Notes (Signed)
I have reviewed the patient's medical history in detail and updated the computerized patient record.

## 2023-06-23 NOTE — Progress Notes (Signed)
 Called to room to assist during endoscopic procedure.  Patient ID and intended procedure confirmed with present staff. Received instructions for my participation in the procedure from the performing physician.

## 2023-06-23 NOTE — Patient Instructions (Addendum)
Resume previous diet Continue present medications Await pathology results  Handouts/information given for polyps, diverticulosis   YOU HAD AN ENDOSCOPIC PROCEDURE TODAY AT THE [facility]:   Refer to the procedure report that was given to you for any specific questions about what was found during the examination.  If the procedure report does not answer your questions, please call your gastroenterologist to clarify.  If you requested that your care partner not be given the details of your procedure findings, then the procedure report has been included in a sealed envelope for you to review at your convenience later.  YOU SHOULD EXPECT: Some feelings of bloating in the abdomen. Passage of more gas than usual.  Walking can help get rid of the air that was put into your GI tract during the procedure and reduce the bloating. If you had a lower endoscopy (such as a colonoscopy or flexible sigmoidoscopy) you may notice spotting of blood in your stool or on the toilet paper. If you underwent a bowel prep for your procedure, you may not have a normal bowel movement for a few days.  Please Note:  You might notice some irritation and congestion in your nose or some drainage.  This is from the oxygen used during your procedure.  There is no need for concern and it should clear up in a day or so.  SYMPTOMS TO REPORT IMMEDIATELY:  Following lower endoscopy (colonoscopy or flexible sigmoidoscopy):  Excessive amounts of blood in the stool  Significant tenderness or worsening of abdominal pains  Swelling of the abdomen that is new, acute  Fever of 100F or higher  For urgent or emergent issues, a gastroenterologist can be reached at any hour by calling [phone]. Do not use MyChart messaging for urgent concerns.    DIET:  We do recommend a small meal at first, but then you may proceed to your regular diet.  Drink plenty of fluids but you should avoid alcoholic beverages for 24  hours.  ACTIVITY:  You should plan to take it easy for the rest of today and you should NOT DRIVE or use heavy machinery until tomorrow (because of the sedation medicines used during the test).    FOLLOW UP: Our staff will call the number listed on your records the next business day following your procedure.  We will call around 7:15- 8:00 am to check on you and address any questions or concerns that you may have regarding the information given to you following your procedure. If we do not reach you, we will leave a message.     If any biopsies were taken you will be contacted by phone or by letter within the next 1-3 weeks.  Please call us at [phone] if you have not heard about the biopsies in 3 weeks.    SIGNATURES/CONFIDENTIALITY: You and/or your care partner have signed paperwork which will be entered into your electronic medical record.  These signatures attest to the fact that that the information above on your After Visit Summary has been reviewed and is understood.  Full responsibility of the confidentiality of this discharge information lies with you and/or your care-partner. 

## 2023-06-24 ENCOUNTER — Telehealth: Payer: Self-pay | Admitting: *Deleted

## 2023-06-24 NOTE — Telephone Encounter (Signed)
  Follow up Call-     06/23/2023    8:02 AM  Call back number  Post procedure Call Back phone  # [phone]  Permission to leave phone message Yes     Patient questions:  Message left to call us  if necessary.

## 2023-06-27 LAB — SURGICAL PATHOLOGY

## 2023-06-30 ENCOUNTER — Encounter: Payer: Self-pay | Admitting: Gastroenterology

## 2024-03-24 ENCOUNTER — Other Ambulatory Visit: Payer: Self-pay | Admitting: Physical Medicine & Rehabilitation
